# Patient Record
Sex: Male | Born: 1955 | Hispanic: Yes | Marital: Single | Smoking: Former smoker
Health system: Southern US, Community
[De-identification: ages and names within clinical notes are randomized; demographics above are authoritative.]

## PROBLEM LIST (undated history)

## (undated) DIAGNOSIS — E785 Hyperlipidemia, unspecified: Secondary | ICD-10-CM

## (undated) DIAGNOSIS — I5022 Chronic systolic (congestive) heart failure: Secondary | ICD-10-CM

## (undated) DIAGNOSIS — N186 End stage renal disease: Secondary | ICD-10-CM

## (undated) DIAGNOSIS — I739 Peripheral vascular disease, unspecified: Secondary | ICD-10-CM

## (undated) DIAGNOSIS — N400 Enlarged prostate without lower urinary tract symptoms: Secondary | ICD-10-CM

## (undated) DIAGNOSIS — E119 Type 2 diabetes mellitus without complications: Secondary | ICD-10-CM

## (undated) DIAGNOSIS — I255 Ischemic cardiomyopathy: Secondary | ICD-10-CM

## (undated) DIAGNOSIS — I639 Cerebral infarction, unspecified: Secondary | ICD-10-CM

## (undated) DIAGNOSIS — I480 Paroxysmal atrial fibrillation: Secondary | ICD-10-CM

## (undated) DIAGNOSIS — I10 Essential (primary) hypertension: Secondary | ICD-10-CM

## (undated) DIAGNOSIS — I2510 Atherosclerotic heart disease of native coronary artery without angina pectoris: Secondary | ICD-10-CM

## (undated) HISTORY — PX: CARDIAC CATHETERIZATION: SHX172

## (undated) HISTORY — PX: INSERTION OF DIALYSIS CATHETER: SHX1324

---

## 1999-05-24 ENCOUNTER — Emergency Department (HOSPITAL_COMMUNITY): Admission: EM | Admit: 1999-05-24 | Discharge: 1999-05-24 | Payer: Self-pay | Admitting: Emergency Medicine

## 2003-04-14 ENCOUNTER — Emergency Department (HOSPITAL_COMMUNITY): Admission: AD | Admit: 2003-04-14 | Discharge: 2003-04-14 | Payer: Self-pay | Admitting: *Deleted

## 2014-01-30 HISTORY — PX: CORONARY ARTERY BYPASS GRAFT: SHX141

## 2015-10-29 ENCOUNTER — Other Ambulatory Visit: Payer: Self-pay | Admitting: Vascular Surgery

## 2015-11-05 ENCOUNTER — Encounter
Admission: RE | Admit: 2015-11-05 | Discharge: 2015-11-05 | Disposition: A | Discharge status: Home / Self Care | Payer: Medicaid Other | Source: Ambulatory Visit | Attending: Vascular Surgery | Admitting: Vascular Surgery

## 2015-11-05 DIAGNOSIS — I252 Old myocardial infarction: Secondary | ICD-10-CM | POA: Insufficient documentation

## 2015-11-05 DIAGNOSIS — Z0183 Encounter for blood typing: Secondary | ICD-10-CM | POA: Insufficient documentation

## 2015-11-05 DIAGNOSIS — Z951 Presence of aortocoronary bypass graft: Secondary | ICD-10-CM | POA: Diagnosis not present

## 2015-11-05 DIAGNOSIS — Z79899 Other long term (current) drug therapy: Secondary | ICD-10-CM | POA: Insufficient documentation

## 2015-11-05 DIAGNOSIS — Z8249 Family history of ischemic heart disease and other diseases of the circulatory system: Secondary | ICD-10-CM | POA: Insufficient documentation

## 2015-11-05 DIAGNOSIS — F17200 Nicotine dependence, unspecified, uncomplicated: Secondary | ICD-10-CM | POA: Insufficient documentation

## 2015-11-05 DIAGNOSIS — N186 End stage renal disease: Secondary | ICD-10-CM | POA: Insufficient documentation

## 2015-11-05 DIAGNOSIS — Z01818 Encounter for other preprocedural examination: Secondary | ICD-10-CM | POA: Insufficient documentation

## 2015-11-05 DIAGNOSIS — Z992 Dependence on renal dialysis: Secondary | ICD-10-CM | POA: Insufficient documentation

## 2015-11-05 DIAGNOSIS — E1122 Type 2 diabetes mellitus with diabetic chronic kidney disease: Secondary | ICD-10-CM | POA: Insufficient documentation

## 2015-11-05 DIAGNOSIS — I2510 Atherosclerotic heart disease of native coronary artery without angina pectoris: Secondary | ICD-10-CM

## 2015-11-05 DIAGNOSIS — Z01812 Encounter for preprocedural laboratory examination: Secondary | ICD-10-CM | POA: Diagnosis not present

## 2015-11-05 DIAGNOSIS — I120 Hypertensive chronic kidney disease with stage 5 chronic kidney disease or end stage renal disease: Secondary | ICD-10-CM | POA: Insufficient documentation

## 2015-11-05 DIAGNOSIS — Z8673 Personal history of transient ischemic attack (TIA), and cerebral infarction without residual deficits: Secondary | ICD-10-CM | POA: Diagnosis not present

## 2015-11-05 DIAGNOSIS — Z794 Long term (current) use of insulin: Secondary | ICD-10-CM | POA: Insufficient documentation

## 2015-11-05 DIAGNOSIS — Z833 Family history of diabetes mellitus: Secondary | ICD-10-CM | POA: Insufficient documentation

## 2015-11-05 HISTORY — DX: Benign prostatic hyperplasia without lower urinary tract symptoms: N40.0

## 2015-11-05 HISTORY — DX: Atherosclerotic heart disease of native coronary artery without angina pectoris: I25.10

## 2015-11-05 HISTORY — DX: Cerebral infarction, unspecified: I63.9

## 2015-11-05 HISTORY — DX: End stage renal disease: N18.6

## 2015-11-05 HISTORY — DX: Essential (primary) hypertension: I10

## 2015-11-05 HISTORY — DX: Type 2 diabetes mellitus without complications: E11.9

## 2015-11-05 HISTORY — DX: Hyperlipidemia, unspecified: E78.5

## 2015-11-05 LAB — BASIC METABOLIC PANEL
Anion gap: 12 (ref 5–15)
BUN: 54 mg/dL — AB (ref 6–20)
CHLORIDE: 99 mmol/L — AB (ref 101–111)
CO2: 27 mmol/L (ref 22–32)
Calcium: 8.9 mg/dL (ref 8.9–10.3)
Creatinine, Ser: 5.75 mg/dL — ABNORMAL HIGH (ref 0.61–1.24)
GFR calc Af Amer: 11 mL/min — ABNORMAL LOW (ref >60)
GFR calc non Af Amer: 10 mL/min — ABNORMAL LOW (ref >60)
GLUCOSE: 122 mg/dL — AB (ref 65–99)
POTASSIUM: 4.1 mmol/L (ref 3.5–5.1)
SODIUM: 138 mmol/L (ref 135–145)

## 2015-11-05 LAB — CBC WITH DIFFERENTIAL/PLATELET
Basophils Absolute: 0 10*3/uL (ref 0–0.1)
Basophils Relative: 1 %
EOS PCT: 2 %
Eosinophils Absolute: 0.2 10*3/uL (ref 0–0.7)
HCT: 33.1 % — ABNORMAL LOW (ref 40.0–52.0)
Hemoglobin: 11.4 g/dL — ABNORMAL LOW (ref 13.0–18.0)
LYMPHS ABS: 1 10*3/uL (ref 1.0–3.6)
LYMPHS PCT: 10 %
MCH: 29.3 pg (ref 26.0–34.0)
MCHC: 34.4 g/dL (ref 32.0–36.0)
MCV: 85.1 fL (ref 80.0–100.0)
MONOS PCT: 7 %
Monocytes Absolute: 0.7 10*3/uL (ref 0.2–1.0)
Neutro Abs: 7.8 10*3/uL — ABNORMAL HIGH (ref 1.4–6.5)
Neutrophils Relative %: 80 %
PLATELETS: 299 10*3/uL (ref 150–440)
RBC: 3.88 MIL/uL — AB (ref 4.40–5.90)
RDW: 14.2 % (ref 11.5–14.5)
WBC: 9.6 10*3/uL (ref 3.8–10.6)

## 2015-11-05 LAB — TYPE AND SCREEN
ABO/RH(D): A POS
ANTIBODY SCREEN: NEGATIVE

## 2015-11-05 LAB — PROTIME-INR
INR: 1.01
Prothrombin Time: 13.3 seconds (ref 11.4–15.2)

## 2015-11-05 LAB — SURGICAL PCR SCREEN
MRSA, PCR: NEGATIVE
Staphylococcus aureus: NEGATIVE

## 2015-11-05 LAB — APTT: aPTT: 30 seconds (ref 24–36)

## 2015-11-05 MED ORDER — CHLORHEXIDINE GLUCONATE CLOTH 2 % EX PADS
6.0000 | MEDICATED_PAD | Freq: Once | CUTANEOUS | Status: DC
  Filled 2015-11-05: qty 6

## 2015-11-05 NOTE — Patient Instructions (Addendum)
  Your procedure is scheduled on: 11/15/15 Fri Report to Same Day Surgery 2nd floor medical mall To find out your arrival time please call [phone] between 1PM - 3PM on 11/14/15 Thurs  Remember: Instructions that are not followed completely may result in serious medical risk, up to and including death, or upon the discretion of your surgeon and anesthesiologist your surgery may need to be rescheduled.    _x___ 1. Do not eat food or drink liquids after midnight. No gum chewing or hard candies.     __x__ 2. No Alcohol for 24 hours before or after surgery.   __x__3. No Smoking for 24 prior to surgery.   ____  4. Bring all medications with you on the day of surgery if instructed.    __x__ 5. Notify your doctor if there is any change in your medical condition     (cold, fever, infections).     Do not wear jewelry, make-up, hairpins, clips or nail polish.  Do not wear lotions, powders, or perfumes. You may wear deodorant.  Do not shave 48 hours prior to surgery. Men may shave face and neck.  Do not bring valuables to the hospital.    [facility] is not responsible for any belongings or valuables.               Contacts, dentures or bridgework may not be worn into surgery.  Leave your suitcase in the car. After surgery it may be brought to your room.  For patients admitted to the hospital, discharge time is determined by your treatment team.   Patients discharged the day of surgery will not be allowed to drive home.    Please read over the following fact sheets that you were given:   [facility] Preparing for Surgery and or MRSA Information   _x___ Take these medicines the morning of surgery with A SIP OF WATER:    1. amLODipine (NORVASC) 2.5 MG tablet  2.carvedilol (COREG) 12.5 MG tablet  3.isosorbide dinitrate (ISORDIL) 30 MG tablet  4.lisinopril (PRINIVIL,ZESTRIL) 2.5 MG tablet  5.  6.  ____ Fleet Enema (as directed)   _x___ Use CHG Soap or sage wipes as directed on  instruction sheet   ____ Use inhalers on the day of surgery and bring to hospital day of surgery  ____ Stop metformin 2 days prior to surgery    __x__ Take 1/2 of usual insulin dose the night before surgery and none on the morning of           surgery.   __x__ Stop aspirin or coumadin, or plavix Stop Plavix 4 days before surgery  _x__ Stop Anti-inflammatories such as Advil, Aleve, Ibuprofen, Motrin, Naproxen,          Naprosyn, Goodies powders or aspirin products. Ok to take Tylenol.   ____ Stop supplements until after surgery.    ____ Bring C-Pap to the hospital.

## 2015-11-06 NOTE — Pre-Procedure Instructions (Signed)
MORE THAN 100 PAGES RECEIVED FROM [facility]. DISCHARGE SUMMARY AND CARDIAC SUMMARY [facility]ED TO [facility] AFTER CARDIAC CLEARANCE REQUESTED BY DR Randa NgoPISCITELLO

## 2015-11-14 ENCOUNTER — Telehealth: Payer: Self-pay | Admitting: Internal Medicine

## 2015-11-14 NOTE — Telephone Encounter (Signed)
Received cardiac clearance request from [facility] for 10/4 left branch/axillary graft. Pt has 9/20 new patient OV w/Dr. End. Clearance placed in MD basket.

## 2015-11-14 NOTE — Pre-Procedure Instructions (Addendum)
[facility] AT [facility] 11/11/15    11/13/15   AND 11/14/15 TO GET INFO ON CLEARANCE STATUS SPOKE WITH LAURA,DID NOT RECEIVE PREVIOUS FAX. REFAXED. SURGERY TO BE RESCHEDULED AND SHE WILL NOTIFY PATIENT

## 2015-11-20 ENCOUNTER — Encounter: Payer: Self-pay | Admitting: Internal Medicine

## 2015-11-20 ENCOUNTER — Ambulatory Visit (INDEPENDENT_AMBULATORY_CARE_PROVIDER_SITE_OTHER): Payer: Medicaid Other | Admitting: Internal Medicine

## 2015-11-20 VITALS — BP 88/50 | HR 80 | Ht 68.0 in | Wt 150.5 lb

## 2015-11-20 DIAGNOSIS — I255 Ischemic cardiomyopathy: Secondary | ICD-10-CM | POA: Diagnosis not present

## 2015-11-20 DIAGNOSIS — I159 Secondary hypertension, unspecified: Secondary | ICD-10-CM | POA: Diagnosis not present

## 2015-11-20 DIAGNOSIS — I639 Cerebral infarction, unspecified: Secondary | ICD-10-CM | POA: Diagnosis not present

## 2015-11-20 DIAGNOSIS — I2510 Atherosclerotic heart disease of native coronary artery without angina pectoris: Secondary | ICD-10-CM | POA: Diagnosis not present

## 2015-11-20 DIAGNOSIS — Z01810 Encounter for preprocedural cardiovascular examination: Secondary | ICD-10-CM

## 2015-11-20 DIAGNOSIS — I209 Angina pectoris, unspecified: Secondary | ICD-10-CM | POA: Insufficient documentation

## 2015-11-20 DIAGNOSIS — I10 Essential (primary) hypertension: Secondary | ICD-10-CM | POA: Insufficient documentation

## 2015-11-20 NOTE — Progress Notes (Signed)
New Outpatient Visit Date: 11/20/2015  Referring Provider: Levora Dredge, MD [facility]  Chief Complaint:  Chief Complaint  Patient presents with  . other    Cardiac clearance for INSERTION OF ARTERIOVENOUS (AV) GORE-TEX GRAFT ARM ( BRACH / AXILLARY GRAFT )    HPI:  [name] is a 60 y.o. 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 Gilda Crease for preoperative cardiovascular risk assessment before undergoing left upper extremity dialysis graft creation. History is obtained with the assistance of a Spanish interpreter. Today, the patient reports feeling well, though he notes considerable fatigue and dyspnea with even mild exertion. This has been present for at least 3-6 months. He denies chest pain, lightheadedness, and orthopnea. He is compliant with his medications and dialysis schedule. He last underwent HD yesterday via his right chest tunneled catheter.  The patient was hospitalized in 08/2015 at [facility] in [place], [place] with chest pain and shortness of breath. This time, he was found to have a non-ST segment elevation MI that was complicated by acute decompensated heart failure, acute stroke, and advanced renal failure. He was started on hemodialysis and did not undergo cardiac catheterization due to the aforementioned stroke. He was treated medically with improvement in his mental status is felt to be due to a combination of stroke, uremia, and heart failure. At that time, he was noted to have severely reduced LV systolic function with an EF of 30-35%. He was also very hypertensive at that time requiring multiple blood pressure agents. The patient notes that since that time, his blood pressure has  been well-controlled or even low, most often shortly after his dialysis session.  Mr. Grine was previously followed by [facility] cardiology, having last seen Dr. Erline Levine in 08/2014.  At that time, the patient was doing well. Echocardiogram following CABG revealed improved systolic function from 30-35% up to 45-50%. The patient also reports that he may need carotid artery intervention on the right at some point in the future but does not know specifics. He has been referred to [facility] for possible, though he would prefer to have this done [place]. Of note, carotid Doppler performed at [facility] in 08/2015 showed 41-59% stenosis in the proximal right internal carotid artery. There is no significant left internal carotid artery stenosis.  --------------------------------------------------------------------------------------------------  Past Medical History:  Diagnosis Date  . Anginal pain (HCC)   . Coronary artery disease   . Diabetes mellitus without complication (HCC)   . Elevated lipids   . Enlarged prostate   . ESRD ([place] stage renal disease) (HCC)    dialysis M-W-F  . Hypertension   . Myocardial infarction (HCC)   . Stroke [facility])    Lt side this year July    Past Surgical History:  Procedure Laterality Date  . CORONARY ARTERY BYPASS GRAFT  01/2014   [facility] (LIMA -> LAD and SVG -> OM)    Outpatient Encounter Prescriptions as of 11/20/2015  Medication Sig  . amLODipine (NORVASC) 2.5 MG tablet Take 2.5 mg by mouth daily.  [name] aspirin EC 81 MG tablet Take 81 mg by mouth daily.  [name] atorvastatin (LIPITOR) 40 MG tablet Take 80 mg by mouth daily at 6 PM.  . carvedilol (COREG) 12.5 MG tablet Take 12.5 mg by  mouth 2 (two) times daily with a meal.  . clopidogrel (PLAVIX) 75 MG tablet Take 75 mg by mouth daily.  [name]. glipiZIDE (GLUCOTROL) 5 MG tablet Take 10 mg by mouth 2 (two) times daily before a meal.  . insulin glargine (LANTUS) 100 UNIT/ML injection Inject 19 Units into the skin at bedtime.  .  insulin regular (NOVOLIN R,HUMULIN R) 100 units/mL injection Inject 10 Units into the skin 3 (three) times daily before meals. 0-10 units  . isosorbide dinitrate (ISORDIL) 30 MG tablet Take 30 mg by mouth daily.  [name]. lisinopril (PRINIVIL,ZESTRIL) 2.5 MG tablet Take 2.5 mg by mouth daily.  . megestrol (MEGACE) 20 MG tablet Take 20 mg by mouth 2 (two) times daily.  . Melatonin 3 MG TABS Take 1 tablet by mouth at bedtime as needed.  . nitroGLYCERIN (NITROSTAT) 0.4 MG SL tablet Place 0.4 mg under the tongue every 5 (five) minutes as needed for chest pain.  . tamsulosin (FLOMAX) 0.4 MG CAPS capsule Take 0.4 mg by mouth daily after breakfast.   No facility-administered encounter medications on file as of 11/20/2015.     Allergies: Review of patient's allergies indicates no known allergies.  Social History   Social History  . Marital status: Single    Spouse name: N/A  . Number of children: N/A  . Years of education: N/A   Occupational History  . Not on file.   Social History Main Topics  . Smoking status: Former Smoker    Packs/day: 0.50    Years: 45.00    Quit date: 01/05/2015  . Smokeless tobacco: Never Used  . Alcohol use No  . Drug use: No  . Sexual activity: Not on file   Other Topics Concern  . Not on file   Social History Narrative  . No narrative on file    Family History  Problem Relation Age of Onset  . Cancer Mother   . Diabetes Mother   . Diabetes Father     Review of Systems: A 12-system review of systems was performed and was negative except as noted in the HPI.  --------------------------------------------------------------------------------------------------  Physical Exam: BP (!) 88/50 (BP Location: Left Arm, Patient Position: Sitting)   Pulse 80   Ht 5\' 8"  (1.727 m)   Wt 150 lb 8 oz (68.3 kg)   BMI 22.88 kg/m   General:  Thin, chronically ill-appearing man, seated comfortably in the exam room. He is accompanied by his daughter. HEENT: Mild  conjunctival pallor is noted. There is no scleral icterus..  Moist mucous membranes.  OP clear. Neck: Supple without lymphadenopathy, thyromegaly, JVD, or HJR.  No carotid bruit. Lungs: Normal work of breathing.  Clear to auscultation bilaterally without wheezes or crackles. CV: Regular rate and rhythm without murmurs, rubs, or gallops.  Non-displaced PMI. Abd: Bowel sounds present.  Soft, NT/ND without hepatosplenomegaly Ext: No lower extremity edema.  Radial, PT, and DP pulses are 2+ bilaterally Skin: warm and dry without rash Neuro: Patient ambulates with a cane. He has 4/5 strength in the left lower extremity. Otherwise, he has 4+/5 strength in the upper and right lower extremities. Psych: Normal mood and affect.  EKG:  Normal sinus rhythm with borderline QT prolongation. Otherwise no significant abnormalities.  Lab Results  Component Value Date   WBC 9.6 11/05/2015   HGB 11.4 (L) 11/05/2015   HCT 33.1 (L) 11/05/2015   MCV 85.1 11/05/2015   PLT 299 11/05/2015    Lab Results  Component Value Date   NA  138 11/05/2015   K 4.1 11/05/2015   CL 99 (L) 11/05/2015   CO2 27 11/05/2015   BUN 54 (H) 11/05/2015   CREATININE 5.75 (H) 11/05/2015   GLUCOSE 122 (H) 11/05/2015    --------------------------------------------------------------------------------------------------  ASSESSMENT AND PLAN: 1. Atherosclerosis of native coronary artery of native heart without angina pectoris The patient has a history of significant coronary artery disease including 90% LMCA stenosis leading to 2 vessel CABG at [facility] in 01/2016. Though he does not express chest pain today, his considerable fatigue and exertional dyspnea may reflect anginal equivalents. This is further confounded by the patient's severe LV dysfunction noted by echocardiogram at [facility] in July. At this time, we have agreed to continue dual antiplatelet therapy for medical management of recent NSTEMI as well as aggressive secondary  prevention with atorvastatin and carvedilol. We will obtain a transthoracic echocardiogram to reevaluate his systolic function. If he continues to have severe systolic dysfunction, we will proceed with cardiac catheterization. If his LVEF has improved, we will opt for a myocardial perfusion stress test instead.  2. Hypertension Patient is hypotensive today, though he is asymptomatic. In the setting of his severe LV dysfunction noted in July, the patient likely has soft blood pressures at baseline (as noted at preadmission testing on 11/05/15). We have agreed to discontinue amlodipine. In an effort to maintain his evidence-based heart failure therapy, we will continue with his current doses of carvedilol and lisinopril. The patient was advised to seek immediate medical attention should he develop lightheadedness.  3. Cardiomyopathy, ischemic The patient appears euvolemic to dry on exam today. He is quite diligent about restricting his fluid intake. He notes considerable fatigue and exertional dyspnea with even mild exertion compatible with NYHA class III heart failure. We will continue him on his current doses of carvedilol and lisinopril. If his blood pressure remains soft, however, we may need to consider de-escalation of carvedilol and liberalization of fluid intake. We will obtain a transthoracic echocardiogram to reassess his LV function as he has symptomatically improved since his hospitalization at [facility] in July. He will also need further ischemia evaluation, as detailed above.  4. Cerebrovascular accident (CVA), unspecified mechanism (HCC) The patient suffered acute bilateral strokes during his hospitalization at [facility] in July. It is unclear what the underlying etiology was, given that he has moderate ICA plaque on the right and no significant stenosis on the left. It should be noted that he had an isolated episode of atrial fibrillation in the postoperative period following CABG in  2015. It is conceivable that he has paroxysmal atrial fibrillation, which led to his recent stroke. His CHADSVASC score of 5 suggests that he would benefit from systemic anticoagulation if he indeed does have atrial fibrillation. We will readdress this when he returns for follow-up. In the meantime, we have agreed to continue with dual antiplatelet therapy with aspirin and clopidogrel. Given his [place]-stage renal disease, he would likely need warfarin for long-term anticoagulation if we choose to go down this route. Placement of an event monitor may also be helpful to evaluate for silent atrial fibrillation.  The patient also wishes to speak with vascular surgery in [place] rather than [facility] regarding possible intervention for his carotid artery disease. I have encouraged him to speak with Dr. Gilda Crease, with whom the patient is scheduled to undergo dialysis access placement.  5. Preop cardiovascular exam At this time, the patient is very high risk for any elective surgery. I would advocate for completing the aforementioned  cardiac workup, including echocardiogram and ischemia evaluation, prior to proceeding with nonemergent surgery. Further recommendations will be provided the following cardiovascular workup.  Follow-up: Return to clinic in 6 weeks.  Greater than 60 minutes was spent interviewing and examining the patient as well as reviewing records for his complex cardiac history from [facility]. More than 50% of the time was spent face-to-face with the patient.  Yvonne Kendall, MD 11/20/2015 1:49 PM

## 2015-11-20 NOTE — Patient Instructions (Addendum)
Medication Instructions:  Your physician has recommended you make the following change in your medication:  1. STOP taking Amlodipine    Testing/Procedures: Your physician has requested that you have an echocardiogram. Echocardiography is a painless test that uses sound waves to create images of your heart. It provides your doctor with information about the size and shape of your heart and how well your heart's chambers and valves are working. This procedure takes approximately one hour. There are no restrictions for this procedure.   Follow-Up: Your physician recommends that you schedule a follow-up appointment in: 6 weeks with Dr. Okey DupreEnd.   It was a pleasure seeing you today here in the office. Please do not hesitate to give us a call back if you have any further questions. [phone]  Hebron CellarPamela A. RN, BSN        Ecocardiograma (Echocardiogram) El ecocardiograma utiliza ondas sonoras (ultrasonido) para obtener una imagen del corazn. El ecocardiograma es simple e indoloro, se obtiene en un perodo de tiempo breve y proporciona informacin valiosa a su mdico. Las imgenes de un ecocardiograma pueden proporcionar el siguiente tipo de informacin:  Evidencia de enfermedad arterial coronaria (EAC).  Tamao del corazn.  Funcin del msculo cardaco.  Funcin de la vlvula cardaca.  Deteccin de aneurisma.  Evidencia de un infarto de miocardio pasado.  Acumulacin de lquido alrededor del corazn.  Engrosamiento del msculo cardaco.  Evaluacin de la funcin de la vlvula cardaca. INFORME A SU MDICO:  Cualquier alergia que tenga.  Todos los Walt Disneymedicamentos que utiliza, incluidos vitaminas, hierbas, gotas oftlmicas, cremas y [address]amentos de [address] lib[address].  Problemas previos que usted o los Graybar Electricmiembros de su familia hayan tenido con el uso de anestsicos.  Enfermedades de la sangre que tenga.  Cirugas previas.  Enfermedades que tenga.  Posibilidad de embarazo, si  corresponde. ANTES DEL PROCEDIMIENTO  No se requiere una preparacin especial. Coma y beba con normalidad.  PROCEDIMIENTO   Para lograr una imagen del corazn, se aplicar un gel en el pecho y luego se pasar un instrumento similar a una vara (transductor) sobre este. El gel ayudar a transmitir las Corning Incorporatedondas sonoras del transductor. Las ondas sonoras rebotarn inofensivamente en el corazn para permitir capturar las imgenes del corazn en movimiento, en tiempo real. Luego, las imgenes se Agricultural engineerregistrarn.  Quiz necesite una va IV para recibir un medicamento que mejore la calidad de las imgenes. DESPUS DEL PROCEDIMIENTO Puede retomar su rutina normal, incluidos la dieta, las actividades y Pulte Homeslos medicamentos, a menos que su mdico le indique lo contrario.   Esta informacin no tiene Theme park managercomo fin reemplazar el consejo del mdico. Asegrese de hacerle al mdico cualquier pregunta que tenga.   Document Released: 02/16/2005 Document Revised: 03/09/2014 Elsevier Interactive Patient Education [employer]2016 Elsevier Inc.

## 2015-11-21 ENCOUNTER — Ambulatory Visit (INDEPENDENT_AMBULATORY_CARE_PROVIDER_SITE_OTHER): Payer: Medicaid Other

## 2015-11-21 ENCOUNTER — Other Ambulatory Visit: Payer: Self-pay

## 2015-11-21 DIAGNOSIS — I2510 Atherosclerotic heart disease of native coronary artery without angina pectoris: Secondary | ICD-10-CM

## 2015-11-21 DIAGNOSIS — I159 Secondary hypertension, unspecified: Secondary | ICD-10-CM

## 2015-11-22 ENCOUNTER — Telehealth: Payer: Self-pay | Admitting: *Deleted

## 2015-11-22 DIAGNOSIS — R0602 Shortness of breath: Secondary | ICD-10-CM

## 2015-11-22 DIAGNOSIS — Z01810 Encounter for preprocedural cardiovascular examination: Secondary | ICD-10-CM

## 2015-11-22 NOTE — Telephone Encounter (Signed)
-----   Message from Yvonne Kendallhristopher End, MD sent at 11/22/2015  8:04 AM EDT ----- Hello, Mr. Odette HornsGarcia's echo shows normalization of LVEF.  [facility] for preoperative evaluation before undergoing dialysis graft placement.  I would greatly appreciate it, if you could let Mr. Felipa FurnaceGarcia know of the echo results and schedule him for the Myoview at his earliest convenience.  Thanks.

## 2015-11-22 NOTE — Telephone Encounter (Addendum)
Interpreter line used for this call. Interpreter Charlie ID # [id number]. Reviewed echocardiogram results with patients daughter and scheduled lexiscan as ordered by Dr. Okey DupreEnd. Reviewed all instructions for stress test and instructed her to have him hold his carvedilol and isosorbide dinitrate the night before and the morning of his procedure. Also to hold glipizide and decrease lantus to 12 units the night before. Reviewed with her all instructions for test, time, and location. She verbalized understanding of all instructions and had no further questions at this time. Instructed her to call back if any further questions and that we will arrange for patient to have an interpreter at this test.

## 2015-12-04 ENCOUNTER — Encounter
Admission: RE | Admit: 2015-12-04 | Discharge: 2015-12-04 | Disposition: A | Discharge status: Home / Self Care | Payer: Medicare Other | Source: Ambulatory Visit | Attending: Internal Medicine | Admitting: Internal Medicine

## 2015-12-04 DIAGNOSIS — R0602 Shortness of breath: Secondary | ICD-10-CM | POA: Diagnosis not present

## 2015-12-04 DIAGNOSIS — Z01810 Encounter for preprocedural cardiovascular examination: Secondary | ICD-10-CM | POA: Insufficient documentation

## 2015-12-04 MED ORDER — TECHNETIUM TC 99M TETROFOSMIN IV KIT
32.2240 | PACK | Freq: Once | INTRAVENOUS | Status: AC | PRN
  Administered 2015-12-04: 32.224 via INTRAVENOUS

## 2015-12-04 MED ORDER — TECHNETIUM TC 99M TETROFOSMIN IV KIT
12.9900 | PACK | Freq: Once | INTRAVENOUS | Status: AC | PRN
  Administered 2015-12-04: 12.99 via INTRAVENOUS

## 2015-12-04 MED ORDER — REGADENOSON 0.4 MG/5ML IV SOLN
0.4000 mg | Freq: Once | INTRAVENOUS | Status: AC
  Administered 2015-12-04: 0.4 mg via INTRAVENOUS

## 2015-12-05 ENCOUNTER — Telehealth: Payer: Self-pay | Admitting: *Deleted

## 2015-12-05 NOTE — Telephone Encounter (Signed)
Using Language Line interpreter Sheryle HailDiamond # [phone] Left voicemail message for patient to call back for results.

## 2015-12-05 NOTE — Telephone Encounter (Signed)
-----   Message from Yvonne Kendallhristopher End, MD sent at 12/05/2015  2:00 PM EDT ----- Regarding: Stress test and follow-up Hello,  Please let [name] know that his stress test is reassuring without evidence of ischemia.  He should follow-up with me at his earliest convenience (currently scheduled for 11/1, but can be moved up if possible) so that we can make sure that he is tolerating the medication changes that were made at his last visit.  If so, then he will be able to proceed with his vascular surgery.  Please try to schedule 30 minutes for his visit, since he will require a Spanish interpreter.  Thanks.  Thayer Ohmhris

## 2015-12-06 ENCOUNTER — Telehealth: Payer: Self-pay | Admitting: Internal Medicine

## 2015-12-06 ENCOUNTER — Ambulatory Visit: Admission: RE | Admit: 2015-12-06 | Payer: Self-pay | Source: Ambulatory Visit | Admitting: Vascular Surgery

## 2015-12-06 ENCOUNTER — Encounter: Admission: RE | Payer: Self-pay | Source: Ambulatory Visit

## 2015-12-06 SURGERY — INSERTION OF ARTERIOVENOUS (AV) GORE-TEX GRAFT ARM
Anesthesia: General | Laterality: Left

## 2015-12-06 NOTE — Telephone Encounter (Signed)
S[facility]w [facility] at [facility]. Pt will need f/u OV w/Dr. End before cardiac clearance given. Awaiting pt call back to set this up. Becca appreciative of the call.

## 2015-12-06 NOTE — Telephone Encounter (Signed)
Becca calling from [facility] asking if we can please send the cardiac clearance to  They are going to reschedule the test to another day, due to pt having to do cardiac testing with us.   Request for surgical clearance:  1. What type of surgery is being performed? Av Fistula placed for hemodialysis  2. When is this surgery scheduled? They are going to be rescheduling it will have a date once we clear the patient.    3. Are there any medications that need to be held prior to surgery and how long? Not sure yet.  4. Name of physician performing surgery?  [facility]   5. What is your office phone and fax number?   Fax: AttnVernona Rieger: Laura at [phone]  and a copy to Attn:  Becca at [facility] [phone]

## 2015-12-09 NOTE — Telephone Encounter (Signed)
That sounds great. Thanks.

## 2015-12-09 NOTE — Telephone Encounter (Signed)
Using YUM! BrandsLanguage Line Interpreter Roxana Hiresapiana ID # [phone] Spoke with patients daughter and reviewed her fathers results per release form. Also rescheduled his follow up appointment for 12/18/15 at 3:00PM with Dr. Okey DupreEnd to see how he is tolerating his medication changes and also to get cardiac clearance for his vascular surgery. She verbalized understanding of our conversation, agreement with plan of care, and had no further questions at this time. Let her know that we would request an interpreter for the upcoming appointment. She was thankful and had no further questions. Entered interpreter request in service hub for this appointment and extended appointment scheduled.

## 2015-12-09 NOTE — Telephone Encounter (Signed)
Spoke with Delorise [facility]Grace at [facility] and made her aware that I was able to reach the patients daughter and rescheduled his appointment 12/18/15 and that once we get clearance that we will forward to them. She verbalized understanding and was thankful for us calling with update.

## 2015-12-09 NOTE — Telephone Encounter (Signed)
Using Language Line interpreter Roxana Hiresapiana ID # [phone] [id number] Left voicemail message for patient to call back for results and appointment.

## 2015-12-18 ENCOUNTER — Emergency Department
Admission: EM | Admit: 2015-12-18 | Discharge: 2015-12-18 | Disposition: A | Discharge status: Home / Self Care | Payer: Medicare Other | Attending: Emergency Medicine | Admitting: Emergency Medicine

## 2015-12-18 ENCOUNTER — Telehealth: Payer: Self-pay | Admitting: Internal Medicine

## 2015-12-18 ENCOUNTER — Encounter: Payer: Self-pay | Admitting: Internal Medicine

## 2015-12-18 ENCOUNTER — Encounter: Payer: Self-pay | Admitting: Emergency Medicine

## 2015-12-18 ENCOUNTER — Ambulatory Visit: Payer: Medicaid Other | Admitting: Internal Medicine

## 2015-12-18 DIAGNOSIS — R531 Weakness: Secondary | ICD-10-CM | POA: Diagnosis present

## 2015-12-18 DIAGNOSIS — Z951 Presence of aortocoronary bypass graft: Secondary | ICD-10-CM | POA: Insufficient documentation

## 2015-12-18 DIAGNOSIS — Z794 Long term (current) use of insulin: Secondary | ICD-10-CM | POA: Insufficient documentation

## 2015-12-18 DIAGNOSIS — I120 Hypertensive chronic kidney disease with stage 5 chronic kidney disease or end stage renal disease: Secondary | ICD-10-CM | POA: Insufficient documentation

## 2015-12-18 DIAGNOSIS — Z87891 Personal history of nicotine dependence: Secondary | ICD-10-CM | POA: Insufficient documentation

## 2015-12-18 DIAGNOSIS — I2510 Atherosclerotic heart disease of native coronary artery without angina pectoris: Secondary | ICD-10-CM | POA: Diagnosis not present

## 2015-12-18 DIAGNOSIS — E1122 Type 2 diabetes mellitus with diabetic chronic kidney disease: Secondary | ICD-10-CM | POA: Diagnosis not present

## 2015-12-18 DIAGNOSIS — Z992 Dependence on renal dialysis: Secondary | ICD-10-CM | POA: Insufficient documentation

## 2015-12-18 DIAGNOSIS — E11649 Type 2 diabetes mellitus with hypoglycemia without coma: Secondary | ICD-10-CM | POA: Diagnosis not present

## 2015-12-18 DIAGNOSIS — Z7982 Long term (current) use of aspirin: Secondary | ICD-10-CM | POA: Diagnosis not present

## 2015-12-18 DIAGNOSIS — Z79899 Other long term (current) drug therapy: Secondary | ICD-10-CM | POA: Diagnosis not present

## 2015-12-18 DIAGNOSIS — N186 End stage renal disease: Secondary | ICD-10-CM | POA: Diagnosis not present

## 2015-12-18 LAB — CBC WITH DIFFERENTIAL/PLATELET
Basophils Absolute: 0.1 10*3/uL (ref 0–0.1)
Basophils Relative: 1 %
EOS ABS: 0.2 10*3/uL (ref 0–0.7)
EOS PCT: 4 %
HCT: 33.7 % — ABNORMAL LOW (ref 40.0–52.0)
Hemoglobin: 11.1 g/dL — ABNORMAL LOW (ref 13.0–18.0)
LYMPHS ABS: 0.9 10*3/uL — AB (ref 1.0–3.6)
LYMPHS PCT: 15 %
MCH: 29 pg (ref 26.0–34.0)
MCHC: 33 g/dL (ref 32.0–36.0)
MCV: 87.8 fL (ref 80.0–100.0)
MONO ABS: 0.6 10*3/uL (ref 0.2–1.0)
Monocytes Relative: 11 %
Neutro Abs: 4 10*3/uL (ref 1.4–6.5)
Neutrophils Relative %: 69 %
PLATELETS: 233 10*3/uL (ref 150–440)
RBC: 3.84 MIL/uL — ABNORMAL LOW (ref 4.40–5.90)
RDW: 14.9 % — AB (ref 11.5–14.5)
WBC: 5.8 10*3/uL (ref 3.8–10.6)

## 2015-12-18 LAB — BASIC METABOLIC PANEL
Anion gap: 12 (ref 5–15)
BUN: 72 mg/dL — AB (ref 6–20)
CALCIUM: 9 mg/dL (ref 8.9–10.3)
CO2: 27 mmol/L (ref 22–32)
CREATININE: 7.78 mg/dL — AB (ref 0.61–1.24)
Chloride: 101 mmol/L (ref 101–111)
GFR calc Af Amer: 8 mL/min — ABNORMAL LOW (ref >60)
GFR, EST NON AFRICAN AMERICAN: 7 mL/min — AB (ref >60)
GLUCOSE: 151 mg/dL — AB (ref 65–99)
Potassium: 4.6 mmol/L (ref 3.5–5.1)
SODIUM: 140 mmol/L (ref 135–145)

## 2015-12-18 LAB — GLUCOSE, CAPILLARY
GLUCOSE-CAPILLARY: 150 mg/dL — AB (ref 65–99)
GLUCOSE-CAPILLARY: 199 mg/dL — AB (ref 65–99)
Glucose-Capillary: 46 mg/dL — ABNORMAL LOW (ref 65–99)
Glucose-Capillary: 52 mg/dL — ABNORMAL LOW (ref 65–99)
Glucose-Capillary: 131 mg/dL — ABNORMAL HIGH (ref 65–99)

## 2015-12-18 LAB — TROPONIN I
TROPONIN I: 0.06 ng/mL — AB (ref <0.03)
Troponin I: 0.06 ng/mL (ref <0.03)

## 2015-12-18 MED ORDER — OCTREOTIDE ACETATE 100 MCG/ML IJ SOLN
75.0000 ug | Freq: Once | INTRAMUSCULAR | Status: AC
  Administered 2015-12-18: 75 ug via SUBCUTANEOUS
  Filled 2015-12-18: qty 0.8
  Filled 2015-12-18: qty 1.5

## 2015-12-18 NOTE — ED Provider Notes (Signed)
[facility] Emergency Department Provider Note  ____________________________________________  Time seen: Approximately 3:23 PM  I have reviewed the triage vital signs and the nursing notes.   HISTORY  Chief Complaint Weakness   HPI Eddie Myers is a 60 y.o. 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 Patient was in the waiting room when he became diaphoretic and unresponsive. Code blue was called. Stat glucose 36, patient given D50 bolus with improvement of mental status. Patient arrives to the ED stable with no complaints. Patient is on Lantus at nighttime and took his dose yesterday evening. He is also on a sliding scale and took 10 units of insulin at 8 AM. Patient reports that he had lunch at noon but does not remember what he ate. He denies having episodes of hypoglycemia in the past. Patient denies headache, change in vision, chest pain, unilateral weakness or numbness, facial droop, dysphagia, dysarthria, difficulty finding words, abdominal pain, nausea, vomiting. Patient had NM stress test on 12/04/15 showing :   small defect of mild severity present in the mid anterior and apical anterior location. This likely represents scar versus artifact. No significant ischemia is identified.  Left ventricular systolic function is >65%.  This is a low risk study.  Past Medical History:  Diagnosis Date  . Anginal pain (HCC)   . Coronary artery disease   . Diabetes mellitus without complication (HCC)   . Elevated lipids   . Enlarged prostate   . ESRD (end stage renal disease) (HCC)    dialysis M-W-F  . Hypertension   . Myocardial infarction   . Stroke [facility])    Lt side this year July    Patient Active Problem List   Diagnosis Date Noted  . Anginal pain (HCC) 11/20/2015  . Secondary hypertension, unspecified 11/20/2015  . Coronary atherosclerosis of native coronary artery  11/20/2015  . Cardiomyopathy, ischemic 11/20/2015  . Stroke [facility]) 11/20/2015    Past Surgical History:  Procedure Laterality Date  . CORONARY ARTERY BYPASS GRAFT  01/2014   [facility] (LIMA -> LAD and SVG -> OM)    Prior to Admission medications   Medication Sig Start Date End Date Taking? Authorizing Provider  aspirin EC 81 MG tablet Take 81 mg by mouth daily.   Yes Historical Provider, MD  atorvastatin (LIPITOR) 40 MG tablet Take 80 mg by mouth daily at 6 PM.   Yes Historical Provider, MD  carvedilol (COREG) 12.5 MG tablet Take 12.5 mg by mouth 2 (two) times daily with a meal.   Yes Historical Provider, MD  clopidogrel (PLAVIX) 75 MG tablet Take 75 mg by mouth daily.   Yes Historical Provider, MD  glipiZIDE (GLUCOTROL) 5 MG tablet Take 10 mg by mouth 2 (two) times daily before a meal.   Yes Historical Provider, MD  insulin glargine (LANTUS) 100 UNIT/ML injection Inject 19 Units into the skin at bedtime.   Yes Historical Provider, MD  insulin regular (NOVOLIN R,HUMULIN R) 100 units/mL injection Inject 10 Units into the skin 3 (three) times daily before meals. 0-10 units   Yes Historical Provider, MD  isosorbide dinitrate (ISORDIL) 30 MG tablet Take 30 mg by mouth daily.   Yes Historical Provider, MD  lisinopril (PRINIVIL,ZESTRIL) 2.5 MG tablet Take 2.5 mg by mouth daily.   Yes Historical Provider, MD  megestrol (MEGACE) 20 MG tablet Take 20 mg by mouth 2 (two) times daily.  Yes Historical Provider, MD  Melatonin 3 MG TABS Take 1 tablet by mouth at bedtime as needed.   Yes Historical Provider, MD  nitroGLYCERIN (NITROSTAT) 0.4 MG SL tablet Place 0.4 mg under the tongue every 5 (five) minutes as needed for chest pain.   Yes Historical Provider, MD  tamsulosin (FLOMAX) 0.4 MG CAPS capsule Take 0.4 mg by mouth daily after breakfast.   Yes Historical Provider, MD    Allergies Review of patient's allergies indicates no known allergies.  Family History  Problem Relation Age of Onset  .  Cancer Mother   . Diabetes Mother   . Diabetes Father     Social History Social History  Substance Use Topics  . Smoking status: Former Smoker    Packs/day: 0.50    Years: 45.00    Quit date: 01/05/2015  . Smokeless tobacco: Never Used  . Alcohol use No    Review of Systems  Constitutional: Negative for fever. + syncope Eyes: Negative for visual changes. ENT: Negative for sore throat. Cardiovascular: Negative for chest pain. + diaphoresis Respiratory: Negative for shortness of breath. Gastrointestinal: Negative for abdominal pain, vomiting or diarrhea. Genitourinary: Negative for dysuria. Musculoskeletal: Negative for back pain. Skin: Negative for rash. Neurological: Negative for headaches, weakness or numbness.  ____________________________________________   PHYSICAL EXAM:  VITAL SIGNS: ED Triage Vitals  Enc Vitals Group     BP 12/18/15 1514 (!) 161/92     Pulse Rate 12/18/15 1514 77     Resp 12/18/15 1514 18     Temp 12/18/15 1514 97.7 F (36.5 C)     Temp Source 12/18/15 1514 Oral     SpO2 12/18/15 1514 99 %     Weight 12/18/15 1517 170 lb (77.1 kg)     Height 12/18/15 1517 5\' 8"  (1.727 m)     Head Circumference --      Peak Flow --      Pain Score 12/18/15 1518 0     Pain Loc --      Pain Edu? --      Excl. in GC? --     Constitutional: Alert and oriented. Well appearing and in no apparent distress. HEENT:      Head: Normocephalic and atraumatic.         Eyes: Conjunctivae are normal. Sclera is non-icteric. EOMI. PERRL      Mouth/Throat: Mucous membranes are moist.       Neck: Supple with no signs of meningismus. Cardiovascular: Regular rate and rhythm. No murmurs, gallops, or rubs. 2+ symmetrical distal pulses are present in all extremities. No JVD. Respiratory: Normal respiratory effort. Lungs are clear to auscultation bilaterally. No wheezes, crackles, or rhonchi.  Gastrointestinal: Soft, non tender, and non distended with positive bowel sounds. No  rebound or guarding. Musculoskeletal: Nontender with normal range of motion in all extremities. No edema, cyanosis, or erythema of extremities. Neurologic: Normal speech and language. A & O x3, PERRL, no nystagmus, CN II-XII intact, motor testing reveals good tone and bulk throughout. There is no evidence of pronator drift or dysmetria. Muscle strength is 5/5 throughout. Deep tendon reflexes are 2+ throughout with downgoing toes. Sensory examination is intact. Gait deferred Skin: Skin is warm, dry and intact. No rash noted. Psychiatric: Mood and affect are normal. Speech and behavior are normal.  ____________________________________________   LABS (all labs ordered are listed, but only abnormal results are displayed)  Labs Reviewed  GLUCOSE, CAPILLARY - Abnormal; Notable for the following:  Result Value   Glucose-Capillary 150 (*)    All other components within normal limits  CBC WITH DIFFERENTIAL/PLATELET - Abnormal; Notable for the following:    RBC 3.84 (*)    Hemoglobin 11.1 (*)    HCT 33.7 (*)    RDW 14.9 (*)    Lymphs Abs 0.9 (*)    All other components within normal limits  BASIC METABOLIC PANEL - Abnormal; Notable for the following:    Glucose, Bld 151 (*)    BUN 72 (*)    Creatinine, Ser 7.78 (*)    GFR calc non Af Amer 7 (*)    GFR calc Af Amer 8 (*)    All other components within normal limits  TROPONIN I - Abnormal; Notable for the following:    Troponin I 0.06 (*)    All other components within normal limits  GLUCOSE, CAPILLARY - Abnormal; Notable for the following:    Glucose-Capillary 46 (*)    All other components within normal limits  GLUCOSE, CAPILLARY - Abnormal; Notable for the following:    Glucose-Capillary 52 (*)    All other components within normal limits  TROPONIN I - Abnormal; Notable for the following:    Troponin I 0.06 (*)    All other components within normal limits  GLUCOSE, CAPILLARY - Abnormal; Notable for the following:     Glucose-Capillary 131 (*)    All other components within normal limits  GLUCOSE, CAPILLARY - Abnormal; Notable for the following:    Glucose-Capillary 199 (*)    All other components within normal limits  CBG MONITORING, ED  CBG MONITORING, ED   ____________________________________________  EKG  ED ECG REPORT I, Nita Sickle, the attending physician, personally viewed and interpreted this ECG.  Normal sinus rhythm, rate of 77, normal intervals, normal axis, no ST elevations or depressions, T-wave inversions in 1 and aVL. Unchanged from prior ____________________________________________  RADIOLOGY  none  ____________________________________________   PROCEDURES  Procedure(s) performed: None Procedures Critical Care performed:  None ____________________________________________   INITIAL IMPRESSION / ASSESSMENT AND PLAN / ED COURSE  60 y.o. 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 Patient was given D50 and is now back to his baseline. Patient has no complaints. Had a stress test done 2 weeks ago which was negative. We'll check basic labs watch patient for a couple of hours and make sure his sugar doesn't drop again and discharged home as patient is on glipizide on top of Lantus and ISS.  Clinical Course  Comment By Time  Patient repeat BG 46. He does take glipizide so will try octreotide in case this is a sulfonylurea induced hypoglycemia. Patient is drinking orange juice and having peanut butter Nita Sickle, MD 10/18 1655  Patient's BG has remained stable. Patient remains well and at baseline. Recommended patient holding lantus tonight and restarting normal insulin regimen in the am tomorrow. Will dc home at this time. Nita Sickle, MD 10/18 2030    Pertinent labs & imaging results that were available during my care of  the patient were reviewed by me and considered in my medical decision making (see chart for details).    ____________________________________________   FINAL CLINICAL IMPRESSION(S) / ED DIAGNOSES  Final diagnoses:  Hypoglycemia due to type 2 diabetes mellitus (HCC)      NEW MEDICATIONS STARTED DURING THIS VISIT:  New Prescriptions  No medications on file     Note:  This document was prepared using Dragon voice recognition software and may include unintentional dictation errors.    Nita Sicklearolina Sharren Schnurr, MD 12/18/15 2033

## 2015-12-18 NOTE — Discharge Instructions (Signed)
Haga un seguimiento con su mdico en 2 das. No tome su insulina o glipizida esta noche. Puede reanudar que somos un rgimen de insulina normal [place].   Follow-up with your doctor in 2 days. Do not take your insulin or glipizide this evening. You can resume we are normal insulin regimen tomorrow.

## 2015-12-18 NOTE — ED Notes (Signed)

## 2015-12-18 NOTE — ED Provider Notes (Signed)
[facility] Department of Emergency Medicine   Code Blue CONSULT NOTE  Chief Complaint: Cardiac arrest/unresponsive   Level V Caveat: Unresponsive  History of present illness: I was contacted by the hospital for a CODE BLUE unresponsive at outpatient cardiac mall and presented to the patient's bedside. Patient found unresponsive with diaphoretic.  Stat glucose reveal sugar of 36.  Patient given D50 with improvement in mentation.  Patient amnestic to event.       ROS: Unable to obtain, Level V caveat  Scheduled Meds: Continuous Infusions: PRN Meds:. Past Medical History:  Diagnosis Date  . Anginal pain (HCC)   . Coronary artery disease   . Diabetes mellitus without complication (HCC)   . Elevated lipids   . Enlarged prostate   . ESRD (end stage renal disease) (HCC)    dialysis M-W-F  . Hypertension   . Myocardial infarction   . Stroke [facility](HCC)    Lt side this year July   Past Surgical History:  Procedure Laterality Date  . CORONARY ARTERY BYPASS GRAFT  01/2014   [facility] (LIMA -> LAD and SVG -> OM)   Social History   Social History  . Marital status: Single    Spouse name: N/A  . Number of children: N/A  . Years of education: N/A   Occupational History  . Not on file.   Social History Main Topics  . Smoking status: Former Smoker    Packs/day: 0.50    Years: 45.00    Quit date: 01/05/2015  . Smokeless tobacco: Never Used  . Alcohol use No  . Drug use: No  . Sexual activity: Not on file   Other Topics Concern  . Not on file   Social History Narrative  . No narrative on file   No Known Allergies  Last set of Vital Signs (not current) Vitals:   12/18/15 1514  BP: (!) 161/92  Pulse: 77  Resp: 18  Temp: 97.7 F (36.5 C)      Physical Exam  Gen: unresponsive, diaphoretic, acutely ill Cardiovascular: strong carotid pulses Resp:spontaneous respirations Abd: nondistended  Neuro: GCS 6 HEENT: Pupils 3mm  Neck: No crepitus   Musculoskeletal: No deformity  Skin: warm Cardiopulmonary Resuscitation (CPR) Procedure Note  Directed/Performed by: Willy EddyPatrick Modean Mccullum I personally directed ancillary staff and/or performed CPR in an effort to regain return of spontaneous circulation and to maintain cardiac, neuro and systemic perfusion.    Medical Decision making  Patient with profound symptomatically hypoglycemic episode with acute encephalopathy. Symptoms improved after administration of D50. Patient brought to the ER for further evaluation.  Have discussed with the patient and available family all diagnostics and treatments performed thus far and all questions were answered to the best of my ability. The patient demonstrates understanding and agreement with plan.    Assessment and Plan  Transfer to the ER for further evaluation.    Willy EddyPatrick Cathyrn Deas, MD 12/18/15 [phone]

## 2015-12-18 NOTE — ED Triage Notes (Signed)
Pt was having a stress test done in the facility today when pt passed out prior to procedure. Hospital personnel called a code, found his CBG at 30 and gave him 1 amp of Dextrose. CGB now 150. A&O x4. Pt is a dialysis pt.

## 2015-12-19 ENCOUNTER — Encounter: Payer: Self-pay | Admitting: Internal Medicine

## 2015-12-19 NOTE — Telephone Encounter (Signed)
Patient was in for an office visit to see Dr. Okey DupreEnd yesterday and while sitting in the waiting room he became diaphoretic and unresponsive. We moved the patient to the floor and raised his legs per Dr. Gearlean AlfIngal's instructions, code was called and Dr. Mariah MillingGollan was also present. Patient maintained a pulse throughout. Code team arrived and began to assess patient. IV was placed by Tammy SoursGreg RN to left forearm and patients blood sugar was 36. One amp of dextrose was given and patient was then transferred to stretcher and taken to ED for further evaluation.

## 2016-01-01 ENCOUNTER — Ambulatory Visit (INDEPENDENT_AMBULATORY_CARE_PROVIDER_SITE_OTHER): Payer: Medicare Other

## 2016-01-01 ENCOUNTER — Encounter: Payer: Self-pay | Admitting: Internal Medicine

## 2016-01-01 ENCOUNTER — Ambulatory Visit: Payer: Medicaid Other | Admitting: Internal Medicine

## 2016-01-01 ENCOUNTER — Ambulatory Visit (INDEPENDENT_AMBULATORY_CARE_PROVIDER_SITE_OTHER): Payer: Medicare Other | Admitting: Internal Medicine

## 2016-01-01 VITALS — BP 100/64 | HR 81 | Ht 68.0 in | Wt 158.5 lb

## 2016-01-01 DIAGNOSIS — R002 Palpitations: Secondary | ICD-10-CM | POA: Diagnosis not present

## 2016-01-01 NOTE — Progress Notes (Signed)
Follow-up Outpatient Visit Date: 01/01/2016  Chief Complaint: Fatigue and palpitations  HPI:  [name] is a 60 y.o. year-old male with history of coronary artery disease status post 2 vessel [facility]2014 complicated by postop atrial fibrillation, stroke in 08/2015 with residual left-sided weakness, hypertension, diabetes mellitus, and [name]-stage renal disease on hemodialysis, who returns for follow-up after recent echo and stress test in anticipation of HD fisutula treation. History is obtained with the assistance of a Spanish interpreter. Today, the patient reports feeling well with the exception of continued fatigue.  This is not significantly different than at his prior visit with me in September.  At that time, we decreased his blood pressure medications due to borderline hypotension.  He reports being compliant with his medications and hemodialysis.  He notes daily episodes of palpitations lasting ~15 minutes.  They often occur when he starts walking, but can also start at rest.  He denies associated symptoms.  He has not had any chest pain, shortness of breath, lightheadedness, orthopnea, PND, and leg swelling.  The patient was hospitalized in 08/2015 at [facility] in [place], [place] with chest pain and shortness of breath. This time, he was found to have a non-ST segment elevation MI that was complicated by acute decompensated heart failure, acute stroke, and advanced renal failure. He was started on hemodialysis and did not undergo cardiac catheterization due to the aforementioned stroke. He was treated medically with improvement in his mental status is felt to be due to a combination of stroke, uremia, and heart failure. At that time, he was noted to have severely reduced LV systolic function with an EF of 30-35%. He was also very hypertensive at that time requiring multiple blood pressure agents. The patient notes that since that time, his blood pressure has been  well-controlled or even low, most often shortly after his dialysis session.  [name] was previously [facility], having last seen Dr. Erline LevineBode in 08/2014.  At that time, the patient was doing well. Echocardiogram following CABG revealed improved systolic function from 30-35% up to 45-50%. The patient also reports that he may need carotid artery intervention on the right at some point in the future but does not know specifics. He has been referred to [facility] for [facility], though he would prefer to have this done [name]. Of note, carotid Doppler performed at [facility] in 08/2015 showed 41-59% stenosis in the proximal right internal carotid artery. There is no significant left internal carotid artery stenosis.  The patient reports that recent carotid [facility] at [facility] did not reveal severe stenosis that would warrant intervention at this time.  At his last scheduled visit with us, [name] presented with altered mental status in the setting of severe hypoglycemia.  He was taken to the ED for treatment before he could be seen in the office.  He reports feeling back to his baseline with improved control of his blood sugars.  --------------------------------------------------------------------------------------------------  Cardiovascular History & Procedures: Cardiovascular Problems:  CAD s/p CABG (2015)  Post-operative atrial fibrillation (paroxysmal)  Stroke  Risk Factors:  Hypertension, hyperlipidemia, diabetes mellitus, male gender, age, and known coronary artery disease  Cath/PCI:  LHC (01/24/14, [facility]): Diffuse LMCA disease up to 90%.  LAD with 80% mid and 99% apical stenoses.  LCx without disease, though OM1 has diffuse disease.  RCA with moderate diffuse disease and 99% lesion in midportion of PDA.  CV Surgery:  [facility] LIMA -> LAD and SVG ->  OM  EP Procedures and Devices:  None  Non-Invasive Evaluation(s):  Pharmacologic stress test (12/05/15): Low  risk study with small in size, mild in severity mid and apical anterior defect representing scar vs artifact.  No ischemia.  LVEF >65%.  TTE (11/21/15): Normal LV size and function (EF 60-65%).  Grade 1 diastolic dysfunction.  Aortic sclerosis without stenosis.  Normal RV size and function.  Normal PA pressure.  Recent CV Pertinent Labs: Lab Results  Component Value Date   INR 1.01 11/05/2015   K 4.6 12/18/2015   BUN 72 (H) 12/18/2015   CREATININE 7.78 (H) 12/18/2015    Past medical and surgical history were reviewed and updated in EPIC.   Outpatient Encounter Prescriptions as of 01/01/2016  Medication Sig  . aspirin EC 81 MG tablet Take 81 mg by mouth daily.  [name]. atorvastatin (LIPITOR) 40 MG tablet Take 80 mg by mouth daily at 6 PM.  . carvedilol (COREG) 12.5 MG tablet Take 12.5 mg by mouth 2 (two) times daily with a meal.  . clopidogrel (PLAVIX) 75 MG tablet Take 75 mg by mouth daily.  [name]. glipiZIDE (GLUCOTROL) 5 MG tablet Take 10 mg by mouth 2 (two) times daily before a meal.  . insulin glargine (LANTUS) 100 UNIT/ML injection Inject 19 Units into the skin at bedtime.  . insulin regular (NOVOLIN R,HUMULIN R) 100 units/mL injection Inject 10 Units into the skin 3 (three) times daily before meals. 0-10 units  . isosorbide dinitrate (ISORDIL) 30 MG tablet Take 30 mg by mouth daily.  [name]. lisinopril (PRINIVIL,ZESTRIL) 2.5 MG tablet Take 2.5 mg by mouth daily.  . megestrol (MEGACE) 20 MG tablet Take 20 mg by mouth 2 (two) times daily.  . Melatonin 3 MG TABS Take 1 tablet by mouth at bedtime as needed.  . nitroGLYCERIN (NITROSTAT) 0.4 MG SL tablet Place 0.4 mg under the tongue every 5 (five) minutes as needed for chest pain.  . sevelamer carbonate (RENVELA) 800 MG tablet Take 1,600 mg by mouth 3 (three) times daily with meals.   . tamsulosin (FLOMAX) 0.4 MG CAPS capsule Take 0.4 mg by mouth daily after breakfast.   No facility-administered encounter medications on file as of 01/01/2016.      Allergies: Review of patient's allergies indicates no known allergies.  Social History   Social History  . Marital status: Single    Spouse name: N/A  . Number of children: N/A  . Years of education: N/A   Occupational History  . Not on file.   Social History Main Topics  . Smoking status: Former Smoker    Packs/day: 0.50    Years: 45.00    Quit date: 01/05/2015  . Smokeless tobacco: Never Used  . Alcohol use No  . Drug use: No  . Sexual activity: Not on file   Other Topics Concern  . Not on file   Social History Narrative  . No narrative on file    Family History  Problem Relation Age of Onset  . Cancer Mother   . Diabetes Mother   . Diabetes Father     Review of Systems: A 12-system review of systems was performed and was negative except as noted in the HPI.  --------------------------------------------------------------------------------------------------  Physical Exam: BP 100/64 (BP Location: Left Arm, Patient Position: Sitting, Cuff Size: Normal)   Pulse 81   Ht 5\' 8"  (1.727 m)   Wt 71.9 kg (158 lb 8 oz)   BMI 24.10 kg/m   General:  Well-developed, well-nourished man  seated comfortably in the exam room.  He is accompanied by his daughter. HEENT: No conjunctival pallor or scleral icterus.  Moist mucous membranes.  OP clear. Neck: Supple without lymphadenopathy, thyromegaly, JVD, or HJR.  No carotid bruit. Lungs: Normal work of breathing.  Clear to auscultation bilaterally without wheezes or crackles. Heart: Regular rate and rhythm without murmurs, rubs, or gallops.  Non-displaced PMI. Abd: Bowel sounds present.  Soft, NT/ND without hepatosplenomegaly Ext: No lower extremity edema.  Radial, PT, and DP pulses are 2+ bilaterally. Skin: warm and dry without rash.  Right chest HD catheter in place.  Lab Results  Component Value Date   WBC 5.8 12/18/2015   HGB 11.1 (L) 12/18/2015   HCT 33.7 (L) 12/18/2015   MCV 87.8 12/18/2015   PLT 233 12/18/2015     Lab Results  Component Value Date   NA 140 12/18/2015   K 4.6 12/18/2015   CL 101 12/18/2015   CO2 27 12/18/2015   BUN 72 (H) 12/18/2015   CREATININE 7.78 (H) 12/18/2015   GLUCOSE 151 (H) 12/18/2015   Lipid panel (09/08/15, [facility]): Total cholesterol 213, triglycerides 213, HDL 47, LDL 124  --------------------------------------------------------------------------------------------------  ASSESSMENT AND PLAN: Preoperative cardiovascular assessment Patient continues to have fatigue but no angina or shortness of breath to suggest unstable cardiovascular disease. Recent echocardiogram and myocardial perfusion stress test were reassuring with normal LV contraction and no significant ischemia. At this time, the patient can proceed with planned hemodialysis fistula placement. Though he is at elevated risk due to his multiple comorbidities, further intervention or testing will not further mitigate his risk.  Coronary artery disease As above, his recent stress test was reassuring. We will continue his current regimen for secondary prevention including high intensity statin therapy and carvedilol. Given medically managed and STEMI this summer, he will complete one year of dual antiplatelet therapy with aspirin and clopidogrel. Thereafter, he can be switched to aspirin monotherapy.  Ischemic cardiomyopathy LVEF has normalized. Patient appears euvolemic and well compensated on exam today with NYHA class II-III symptoms. We will continue with his current lisinopril and carvedilol regimen.  Hypertension Blood pressure is low normal today. We will not make any further changes at this time.  Palpitations These have been occurring on a daily basis. Given history of a single episode of atrial fibrillation following CABG and stroke during hospitalization this summer, paroxysmal atrial fibrillation is a concern. We have agreed to place a 14 days I'll patch monitor for further evaluation. If there  is a evidence of atrial fibrillation, we will need to consider starting warfarin given elevated CHADSVASc score (4).  Follow-up: Return to clinic in 3 months.  Yvonne Kendall, MD 01/02/2016 6:57 AM

## 2016-01-01 NOTE — Patient Instructions (Addendum)
Medication Instructions:  Your physician recommends that you continue on your current medications as directed. Please refer to the Current Medication list given to you today.  Labwork: NONE  Testing/Procedures: Long Term Monitor for 14 days  Follow-Up: Your physician recommends that you schedule a follow-up appointment in: 3 months with Dr. Okey DupreEnd.   If you need a refill on your cardiac medications before your next appointment, please call your pharmacy.

## 2016-01-02 ENCOUNTER — Encounter: Payer: Self-pay | Admitting: Internal Medicine

## 2016-01-03 ENCOUNTER — Other Ambulatory Visit (INDEPENDENT_AMBULATORY_CARE_PROVIDER_SITE_OTHER): Payer: Self-pay | Admitting: Vascular Surgery

## 2016-01-03 ENCOUNTER — Encounter (INDEPENDENT_AMBULATORY_CARE_PROVIDER_SITE_OTHER): Payer: Self-pay

## 2016-01-06 ENCOUNTER — Encounter
Admission: RE | Admit: 2016-01-06 | Discharge: 2016-01-06 | Disposition: A | Discharge status: Home / Self Care | Payer: Medicare Other | Source: Ambulatory Visit | Attending: Vascular Surgery | Admitting: Vascular Surgery

## 2016-01-06 ENCOUNTER — Ambulatory Visit (INDEPENDENT_AMBULATORY_CARE_PROVIDER_SITE_OTHER): Payer: Medicare Other | Admitting: Vascular Surgery

## 2016-01-06 ENCOUNTER — Encounter (INDEPENDENT_AMBULATORY_CARE_PROVIDER_SITE_OTHER): Payer: Self-pay | Admitting: Vascular Surgery

## 2016-01-06 VITALS — BP 149/68 | HR 78 | Resp 16 | Ht 68.0 in | Wt 163.2 lb

## 2016-01-06 DIAGNOSIS — N186 End stage renal disease: Secondary | ICD-10-CM | POA: Diagnosis not present

## 2016-01-06 DIAGNOSIS — I255 Ischemic cardiomyopathy: Secondary | ICD-10-CM

## 2016-01-06 DIAGNOSIS — Z992 Dependence on renal dialysis: Secondary | ICD-10-CM | POA: Diagnosis not present

## 2016-01-06 DIAGNOSIS — E118 Type 2 diabetes mellitus with unspecified complications: Secondary | ICD-10-CM | POA: Diagnosis not present

## 2016-01-06 DIAGNOSIS — Z01810 Encounter for preprocedural cardiovascular examination: Secondary | ICD-10-CM | POA: Insufficient documentation

## 2016-01-06 DIAGNOSIS — I159 Secondary hypertension, unspecified: Secondary | ICD-10-CM | POA: Diagnosis not present

## 2016-01-06 DIAGNOSIS — Z01812 Encounter for preprocedural laboratory examination: Secondary | ICD-10-CM | POA: Diagnosis not present

## 2016-01-06 LAB — CBC WITH DIFFERENTIAL/PLATELET
BASOS ABS: 0.1 10*3/uL (ref 0–0.1)
BASOS PCT: 1 %
Eosinophils Absolute: 0.3 10*3/uL (ref 0–0.7)
Eosinophils Relative: 5 %
HEMATOCRIT: 30.7 % — AB (ref 40.0–52.0)
HEMOGLOBIN: 10.3 g/dL — AB (ref 13.0–18.0)
Lymphocytes Relative: 20 %
Lymphs Abs: 1.1 10*3/uL (ref 1.0–3.6)
MCH: 29.2 pg (ref 26.0–34.0)
MCHC: 33.5 g/dL (ref 32.0–36.0)
MCV: 87.2 fL (ref 80.0–100.0)
Monocytes Absolute: 0.5 10*3/uL (ref 0.2–1.0)
Monocytes Relative: 8 %
NEUTROS ABS: 3.6 10*3/uL (ref 1.4–6.5)
NEUTROS PCT: 66 %
Platelets: 244 10*3/uL (ref 150–440)
RBC: 3.53 MIL/uL — AB (ref 4.40–5.90)
RDW: 14.5 % (ref 11.5–14.5)
WBC: 5.5 10*3/uL (ref 3.8–10.6)

## 2016-01-06 LAB — BASIC METABOLIC PANEL
ANION GAP: 15 (ref 5–15)
BUN: 66 mg/dL — ABNORMAL HIGH (ref 6–20)
CALCIUM: 8.9 mg/dL (ref 8.9–10.3)
CO2: 23 mmol/L (ref 22–32)
Chloride: 100 mmol/L — ABNORMAL LOW (ref 101–111)
Creatinine, Ser: 8.75 mg/dL — ABNORMAL HIGH (ref 0.61–1.24)
GFR, EST AFRICAN AMERICAN: 7 mL/min — AB (ref >60)
GFR, EST NON AFRICAN AMERICAN: 6 mL/min — AB (ref >60)
Glucose, Bld: 162 mg/dL — ABNORMAL HIGH (ref 65–99)
Potassium: 4.3 mmol/L (ref 3.5–5.1)
Sodium: 138 mmol/L (ref 135–145)

## 2016-01-06 LAB — APTT: APTT: 30 s (ref 24–36)

## 2016-01-06 LAB — TYPE AND SCREEN
ABO/RH(D): A POS
Antibody Screen: NEGATIVE

## 2016-01-06 LAB — PROTIME-INR
INR: 1.02
Prothrombin Time: 13.4 seconds (ref 11.4–15.2)

## 2016-01-06 LAB — SURGICAL PCR SCREEN
MRSA, PCR: NEGATIVE
Staphylococcus aureus: NEGATIVE

## 2016-01-06 NOTE — Patient Instructions (Signed)
Your procedure is scheduled on: January 10, 2016 (Friday) Su procedimiento est programado para: Report to Day Surgery. Second Floor, Medical Mall Presntese a: To find out your arrival time please call [phone] between 1PM - 3PM on January 09, 2016 (Thursday) Para saber su hora de llegada por favor llame al [phone] entre la 1PM - 3PM el da:  Remember: Instructions that are not followed completely may result in serious medical risk, up to and including death, or upon the discretion of your surgeon and anesthesiologist your surgery may need to be rescheduled.  Recuerde: Las instrucciones que no se siguen completamente Armed forces logistics/support/administrative officerpueden resultar en un riesgo de salud grave, incluyendo hasta la [place]muerte o a discrecin de su cirujano y Scientific laboratory techniciananestesilogo, su ciruga se puede posponer.  _x_ 1. Do not eat food or drink liquids after midnight. No gum chewing or hard candies.  No coma alimentos ni tome lquidos despus de la medianoche.  No mastique chicle ni caramelos  duros.     __x__ 2. No alcohol for 24 hours before or after surgery.    No tome alcohol durante las 24 horas antes ni despus de la Azerba[place].   ____ 3. Bring all medications with you on the day of surgery if instructed.    Lleve todos los medicamentos con usted el da de su ciruga si se le ha indicado as.   __x__ 4. Notify your doctor if there is any change in your medical condition (cold, fever,                             infections).    Informe a su mdico si hay algn cambio en su condicin mdica (resfriado, fiebre, infecciones).   Do not wear jewelry, make-up, hairpins, clips or nail polish.  No use joyas, maquillajes, pinzas/ganchos para el cabello ni esmalte de uas.  Do not wear lotions, powders, or perfumes. You may wear deodorant.  No use lociones, polvos o perfumes.  Puede usar desodorante.    Do not shave 48 hours prior to surgery. Men may shave face and neck.  No se afeite 48 horas antes de la Azerba[place]uga.  Los hombres  pueden Commercial Metals Companyafeitarse la cara y el cuello.   Do not bring valuables to the hospital.   No lleve objetos de valor al hospital.  [facility] is not responsible for any belongings or valuables.  [place] no se hace responsable de ningn tipo de pertenencias u objetos de Licensed conveyancervalor.               Contacts, dentures or bridgework may not be worn into surgery.  Los lentes de [place], las dentaduras postizas o puentes no se pueden usar en la [place]uga.  Leave your suitcase in the car. After surgery it may be brought to your room.  Deje su maleta en el auto.  Despus de la ciruga podr traerla a su habitacin.  For patients admitted to the hospital, discharge time is determined by your treatment team.  Para los pacientes que sean ingresados al hospital, el tiempo en el cual se le dar de alta es determinado por su                equipo de Hemettratamiento.   Patients discharged the day of surgery will not be allowed to drive home. A los pacientes que se les da de alta el mismo da de la ciruga no se les permitir conducir a Higher education careers advisercasa.   Please  read over the following fact sheets that you were given: Por favor lea las siguientes hojas de informacin que le dieron:   MRSA Information   _x__ Take these medicines the morning of surgery with A SIP OF WATER:          Owens-Illinoisome estas medicinas la maana de la ciruga con UN SORBO DE AGUA:  1. Carvedilol  2. Isosorbide  3. Lisinopril  4.       5.  6.  ____ Fleet Enema (as directed)          Enema de Fleet (segn lo indicado)    ____ Use CHG Soap as directed          Utilice el jabn de CHG segn lo indicado  ____ Use inhalers on the day of surgery          Use los inhaladores el da de la ciruga  ____ Stop metformin 2 days prior to surgery          Deje de tomar el metformin 2 das antes de la ciruga    _x___ Take 1/2 of usual insulin dose the night before surgery and none on the morning of surgery           Tome la mitad de la dosis habitual de insulina la  noche antes de la Azerba[place]uga y no tome nada en la maana de la             ciruga  (Take one-half of Lantus at bedtime and no insulin the morning of surgery)  __x__ Stop Coumadin/Plavix/aspirin on(Check with Dr. Gilda CreaseSchnier office about stopping Plavix)          Deje de tomar el Coumadin/Plavix/aspirina el da:  __x__ Stop Anti-inflammatories on (No Aspirin Products )          Deje de tomar Doctor, general practiceantiinflamatorios el da:   __x__ Stop supplements until after surgery  (Stop Melatonin now)       Deje de tomar suplementos hasta despus de la ciruga  ____ Bring C-Pap to the hospital          Lleve el C-Pap al hospital

## 2016-01-06 NOTE — Progress Notes (Signed)
Subjective:    Patient ID: Eddie DraftsMauro Myers, male    DOB: [birth year], 60 y.o.   MRN: [id number] Chief Complaint  Patient presents with  . Follow-up   Patient presents for a H&P update. He is scheduled to undergo a left extremity brach-axillary graft. He his medications have been updated. He denies any new health issues or recent sickness. We discussed his procedure in length and I answered any questions. He denies chest pain, shortness of breath, abdominal pain or changes in his bowel habits.    Review of Systems  Constitutional: Negative.   HENT: Negative.   Eyes: Negative.   Respiratory: Negative.   Cardiovascular: Negative.   Gastrointestinal: Negative.   Endocrine: Negative.   Genitourinary:       ESRD  Musculoskeletal: Negative.   Skin: Negative.   Allergic/Immunologic: Negative.   Neurological: Negative.   Hematological: Negative.   Psychiatric/Behavioral: Negative.        Objective:   Physical Exam  Constitutional: He is oriented to person, place, and time. He appears well-developed and well-nourished.  HENT:  Head: Normocephalic and atraumatic.  Eyes: Conjunctivae and EOM are normal. Pupils are equal, round, and reactive to light.  Neck: Normal range of motion.  Cardiovascular: Normal rate, regular rhythm, normal heart sounds and intact distal pulses.   Pulses:      Radial pulses are 2+ on the right side, and 2+ on the left side.       Dorsalis pedis pulses are 2+ on the right side, and 2+ on the left side.       Posterior tibial pulses are 2+ on the right side, and 2+ on the left side.  Pulmonary/Chest: Effort normal and breath sounds normal. No respiratory distress. He has no wheezes.  Permcath: Intact. No signs of infection noted.   Abdominal: Soft. Bowel sounds are normal. He exhibits no distension. There is no tenderness. There is no rebound.  Musculoskeletal: Normal range of motion. He exhibits no edema.  Neurological: He is alert and oriented to person,  place, and time.  Skin: Skin is warm and dry.  Psychiatric: He has a normal mood and affect. His behavior is normal. Judgment and thought content normal.   BP (!) 149/68   Pulse 78   Resp 16   Ht 5\' 8"  (1.727 m)   Wt 163 lb 3.2 oz (74 kg)   BMI 24.81 kg/m   Past Medical History:  Diagnosis Date  . Anginal pain (HCC)   . Coronary artery disease   . Diabetes mellitus without complication (HCC)   . Elevated lipids   . Enlarged prostate   . ESRD (end stage renal disease) (HCC)    dialysis M-W-F  . Hypertension   . Myocardial infarction   . Stroke [facility](HCC)    Lt side this year July   Social History   Social History  . Marital status: Single    Spouse name: N/A  . Number of children: N/A  . Years of education: N/A   Occupational History  . Not on file.   Social History Main Topics  . Smoking status: Former Smoker    Packs/day: 0.50    Years: 45.00    Quit date: 01/05/2015  . Smokeless tobacco: Never Used  . Alcohol use No  . Drug use: No  . Sexual activity: Not on file   Other Topics Concern  . Not on file   Social History Narrative  . No narrative on file   Past Surgical  History:  Procedure Laterality Date  . CORONARY ARTERY BYPASS GRAFT  01/2014   [facility] (LIMA -> LAD and SVG -> OM)   Family History  Problem Relation Age of Onset  . Cancer Mother   . Diabetes Mother   . Diabetes Father    No Known Allergies     Assessment & Plan:  Patient presents for a H&P update. He is scheduled to undergo a left extremity brach-axillary graft. He his medications have been updated. He denies any new health issues or recent sickness. We discussed his procedure in length and I answered any questions. He denies chest pain, shortness of breath, abdominal pain or changes in his bowel habits.   1. ESRD on dialysis (HCC) - Stable He is scheduled to undergo a left extremity brach-axillary graft. Currently maintained by a permcath without issue.   2. Type 2 diabetes  mellitus with complication, unspecified long term insulin use status (HCC) - Stable Encouraged good control as its slows the progression of atherosclerotic disease  3. Secondary hypertension - Stable Encouraged good control as its slows the progression of atherosclerotic disease  Current Outpatient Prescriptions on File Prior to Visit  Medication Sig Dispense Refill  . aspirin EC 81 MG tablet Take 81 mg by mouth daily.    [name]. atorvastatin (LIPITOR) 40 MG tablet Take 80 mg by mouth daily at 6 PM.    . carvedilol (COREG) 12.5 MG tablet Take 12.5 mg by mouth 2 (two) times daily with a meal.    . clopidogrel (PLAVIX) 75 MG tablet Take 75 mg by mouth daily.    [name]. glipiZIDE (GLUCOTROL) 5 MG tablet Take 10 mg by mouth 2 (two) times daily before a meal.    . insulin glargine (LANTUS) 100 UNIT/ML injection Inject 19 Units into the skin at bedtime.    . insulin regular (NOVOLIN R,HUMULIN R) 100 units/mL injection Inject 10 Units into the skin 3 (three) times daily before meals. 0-10 units    . isosorbide dinitrate (ISORDIL) 30 MG tablet Take 30 mg by mouth daily.    [name]. lisinopril (PRINIVIL,ZESTRIL) 2.5 MG tablet Take 2.5 mg by mouth daily.    . megestrol (MEGACE) 20 MG tablet Take 20 mg by mouth 2 (two) times daily.    . Melatonin 3 MG TABS Take 1 tablet by mouth at bedtime as needed.    . nitroGLYCERIN (NITROSTAT) 0.4 MG SL tablet Place 0.4 mg under the tongue every 5 (five) minutes as needed for chest pain.    . sevelamer carbonate (RENVELA) 800 MG tablet Take 1,600 mg by mouth 3 (three) times daily with meals.     . tamsulosin (FLOMAX) 0.4 MG CAPS capsule Take 0.4 mg by mouth daily after breakfast.     No current facility-administered medications on file prior to visit.    There are no Patient Instructions on file for this visit. No Follow-up on file.  Darionna Banke A Nakisha Chai, PA-C

## 2016-01-07 LAB — NM MYOCAR MULTI W/SPECT W/WALL MOTION / EF
CHL CUP NUCLEAR SSS: 5
CSEPHR: 67 %
CSEPPHR: 108 {beats}/min
LV sys vol: 18 mL
LVDIAVOL: 58 mL (ref 62–150)
NUC STRESS TID: 1.28
Rest HR: 85 {beats}/min
SDS: 3
SRS: 3

## 2016-01-07 NOTE — Pre-Procedure Instructions (Signed)
Met B and CBC sent to Dr. Delana Meyer and Anesthesia for review.

## 2016-01-10 ENCOUNTER — Encounter
Admission: RE | Disposition: A | Discharge status: Home / Self Care | Payer: Self-pay | Source: Ambulatory Visit | Attending: Vascular Surgery

## 2016-01-10 ENCOUNTER — Ambulatory Visit: Payer: Medicare Other | Admitting: Anesthesiology

## 2016-01-10 ENCOUNTER — Ambulatory Visit
Admission: RE | Admit: 2016-01-10 | Discharge: 2016-01-10 | Disposition: A | Discharge status: Home / Self Care | Payer: Medicare Other | Source: Ambulatory Visit | Attending: Vascular Surgery | Admitting: Vascular Surgery

## 2016-01-10 DIAGNOSIS — E1122 Type 2 diabetes mellitus with diabetic chronic kidney disease: Secondary | ICD-10-CM | POA: Diagnosis present

## 2016-01-10 DIAGNOSIS — I25119 Atherosclerotic heart disease of native coronary artery with unspecified angina pectoris: Secondary | ICD-10-CM | POA: Diagnosis not present

## 2016-01-10 DIAGNOSIS — N186 End stage renal disease: Secondary | ICD-10-CM | POA: Insufficient documentation

## 2016-01-10 DIAGNOSIS — Z951 Presence of aortocoronary bypass graft: Secondary | ICD-10-CM | POA: Insufficient documentation

## 2016-01-10 DIAGNOSIS — Z8673 Personal history of transient ischemic attack (TIA), and cerebral infarction without residual deficits: Secondary | ICD-10-CM | POA: Diagnosis not present

## 2016-01-10 DIAGNOSIS — D631 Anemia in chronic kidney disease: Secondary | ICD-10-CM | POA: Insufficient documentation

## 2016-01-10 DIAGNOSIS — I252 Old myocardial infarction: Secondary | ICD-10-CM | POA: Insufficient documentation

## 2016-01-10 DIAGNOSIS — I120 Hypertensive chronic kidney disease with stage 5 chronic kidney disease or end stage renal disease: Secondary | ICD-10-CM | POA: Diagnosis not present

## 2016-01-10 HISTORY — PX: AV FISTULA PLACEMENT: SHX1204

## 2016-01-10 LAB — POCT I-STAT 4, (NA,K, GLUC, HGB,HCT)
Glucose, Bld: 189 mg/dL — ABNORMAL HIGH (ref 65–99)
HEMATOCRIT: 33 % — AB (ref 39.0–52.0)
HEMOGLOBIN: 11.2 g/dL — AB (ref 13.0–17.0)
Potassium: 4.4 mmol/L (ref 3.5–5.1)
SODIUM: 138 mmol/L (ref 135–145)

## 2016-01-10 LAB — GLUCOSE, CAPILLARY
Glucose-Capillary: 201 mg/dL — ABNORMAL HIGH (ref 65–99)
Glucose-Capillary: 118 mg/dL — ABNORMAL HIGH (ref 65–99)

## 2016-01-10 SURGERY — INSERTION OF ARTERIOVENOUS (AV) GORE-TEX GRAFT ARM
Anesthesia: General | Laterality: Left | Wound class: Clean

## 2016-01-10 MED ORDER — CHLORHEXIDINE GLUCONATE CLOTH 2 % EX PADS: 6.0000 | MEDICATED_PAD | Freq: Once | CUTANEOUS | Status: DC

## 2016-01-10 MED ORDER — BUPIVACAINE HCL (PF) 0.5 % IJ SOLN
INTRAMUSCULAR | Status: AC
  Filled 2016-01-10: qty 30

## 2016-01-10 MED ORDER — FENTANYL CITRATE (PF) 100 MCG/2ML IJ SOLN
INTRAMUSCULAR | Status: DC | PRN
  Administered 2016-01-10: 25 ug via INTRAVENOUS
  Administered 2016-01-10: 50 ug via INTRAVENOUS
  Administered 2016-01-10: 25 ug via INTRAVENOUS

## 2016-01-10 MED ORDER — GLYCOPYRROLATE 0.2 MG/ML IJ SOLN
INTRAMUSCULAR | Status: DC | PRN
Start: 2016-01-10 — End: 2016-01-10
  Administered 2016-01-10: 0.2 mg via INTRAVENOUS

## 2016-01-10 MED ORDER — CEFAZOLIN SODIUM-DEXTROSE 2-4 GM/100ML-% IV SOLN
INTRAVENOUS | Status: AC
  Administered 2016-01-10: 2 g via INTRAVENOUS
  Filled 2016-01-10: qty 100

## 2016-01-10 MED ORDER — SODIUM CHLORIDE 0.9 % IV SOLN
INTRAVENOUS | Status: DC
  Administered 2016-01-10: 11:00:00 via INTRAVENOUS

## 2016-01-10 MED ORDER — SODIUM CHLORIDE 0.9 % IV SOLN
INTRAVENOUS | Status: DC | PRN
  Administered 2016-01-10: 75 ug/min via INTRAVENOUS

## 2016-01-10 MED ORDER — LIDOCAINE HCL (CARDIAC) 20 MG/ML IV SOLN
INTRAVENOUS | Status: DC | PRN
  Administered 2016-01-10: 25 mg via INTRAVENOUS

## 2016-01-10 MED ORDER — FAMOTIDINE 20 MG PO TABS
ORAL_TABLET | ORAL | Status: AC
  Administered 2016-01-10: 20 mg via ORAL
  Filled 2016-01-10: qty 1

## 2016-01-10 MED ORDER — KETAMINE HCL 50 MG/ML IJ SOLN
INTRAMUSCULAR | Status: DC | PRN
  Administered 2016-01-10: 25 mg via INTRAMUSCULAR

## 2016-01-10 MED ORDER — FAMOTIDINE 20 MG PO TABS
20.0000 mg | ORAL_TABLET | Freq: Once | ORAL | Status: AC
  Administered 2016-01-10: 20 mg via ORAL

## 2016-01-10 MED ORDER — ONDANSETRON HCL 4 MG/2ML IJ SOLN
INTRAMUSCULAR | Status: DC | PRN
  Administered 2016-01-10: 4 mg via INTRAVENOUS

## 2016-01-10 MED ORDER — SODIUM CHLORIDE 0.9 % IV SOLN
INTRAVENOUS | Status: DC | PRN
  Administered 2016-01-10: 40 mL via INTRAMUSCULAR

## 2016-01-10 MED ORDER — BUPIVACAINE HCL (PF) 0.5 % IJ SOLN
INTRAMUSCULAR | Status: DC | PRN
  Administered 2016-01-10: 30 mL

## 2016-01-10 MED ORDER — CEFAZOLIN SODIUM-DEXTROSE 2-4 GM/100ML-% IV SOLN
2.0000 g | INTRAVENOUS | Status: AC
  Administered 2016-01-10 (×2): 2 g via INTRAVENOUS

## 2016-01-10 MED ORDER — FENTANYL CITRATE (PF) 100 MCG/2ML IJ SOLN
INTRAMUSCULAR | Status: AC
  Administered 2016-01-10: 25 ug via INTRAVENOUS
  Filled 2016-01-10: qty 2

## 2016-01-10 MED ORDER — PROPOFOL 10 MG/ML IV BOLUS
INTRAVENOUS | Status: DC | PRN
  Administered 2016-01-10: 100 mg via INTRAVENOUS
  Administered 2016-01-10: 20 mg via INTRAVENOUS

## 2016-01-10 MED ORDER — HEPARIN SODIUM (PORCINE) 5000 UNIT/ML IJ SOLN
INTRAMUSCULAR | Status: AC
  Filled 2016-01-10: qty 1

## 2016-01-10 MED ORDER — MIDAZOLAM HCL 2 MG/2ML IJ SOLN
INTRAMUSCULAR | Status: DC | PRN
  Administered 2016-01-10: 1 mg via INTRAVENOUS

## 2016-01-10 MED ORDER — HYDROCODONE-ACETAMINOPHEN 5-325 MG PO TABS: 1.0000 | ORAL_TABLET | Freq: Four times a day (QID) | ORAL | 0 refills | Status: DC | PRN

## 2016-01-10 MED ORDER — FENTANYL CITRATE (PF) 100 MCG/2ML IJ SOLN
25.0000 ug | INTRAMUSCULAR | Status: DC | PRN
  Administered 2016-01-10 (×2): 25 ug via INTRAVENOUS

## 2016-01-10 MED ORDER — PHENYLEPHRINE HCL 10 MG/ML IJ SOLN
INTRAMUSCULAR | Status: DC | PRN
  Administered 2016-01-10 (×2): 100 ug via INTRAVENOUS
  Administered 2016-01-10 (×2): 200 ug via INTRAVENOUS
  Administered 2016-01-10: 100 ug via INTRAVENOUS
  Administered 2016-01-10: 200 ug via INTRAVENOUS

## 2016-01-10 MED ORDER — EPHEDRINE SULFATE 50 MG/ML IJ SOLN
INTRAMUSCULAR | Status: DC | PRN
  Administered 2016-01-10 (×2): 10 mg via INTRAVENOUS

## 2016-01-10 MED ORDER — ONDANSETRON HCL 4 MG/2ML IJ SOLN: 4.0000 mg | Freq: Once | INTRAMUSCULAR | Status: DC | PRN

## 2016-01-10 SURGICAL SUPPLY — 57 items
APPLIER CLIP 11 MED OPEN (CLIP)
APPLIER CLIP 9.375 SM OPEN (CLIP) ×3
BAG COUNTER SPONGE EZ (MISCELLANEOUS) IMPLANT
BAG DECANTER FOR FLEXI CONT (MISCELLANEOUS) ×3 IMPLANT
BLADE SURG SZ11 CARB STEEL (BLADE) ×3 IMPLANT
BOOT SUTURE AID YELLOW STND (SUTURE) ×3 IMPLANT
BRUSH SCRUB 4% CHG (MISCELLANEOUS) ×3 IMPLANT
CANISTER SUCT 1200ML W/VALVE (MISCELLANEOUS) ×3 IMPLANT
CHLORAPREP W/TINT 26ML (MISCELLANEOUS) ×3 IMPLANT
CLIP APPLIE 11 MED OPEN (CLIP) IMPLANT
CLIP APPLIE 9.375 SM OPEN (CLIP) ×1 IMPLANT
COUNTER SPONGE BAG EZ (MISCELLANEOUS)
DERMABOND ADVANCED (GAUZE/BANDAGES/DRESSINGS) ×2
DERMABOND ADVANCED .7 DNX12 (GAUZE/BANDAGES/DRESSINGS) ×1 IMPLANT
DRESSING SURGICEL FIBRLLR 1X2 (HEMOSTASIS) ×1 IMPLANT
DRSG SURGICEL FIBRILLAR 1X2 (HEMOSTASIS) ×3
ELECT CAUTERY BLADE 6.4 (BLADE) ×3 IMPLANT
ELECT REM PT RETURN 9FT ADLT (ELECTROSURGICAL) ×3
ELECTRODE REM PT RTRN 9FT ADLT (ELECTROSURGICAL) ×1 IMPLANT
GLOVE BIO SURGEON STRL SZ7 (GLOVE) ×9 IMPLANT
GLOVE INDICATOR 7.5 STRL GRN (GLOVE) ×6 IMPLANT
GLOVE SURG SYN 8.0 (GLOVE) ×3 IMPLANT
GOWN STRL REUS W/ TWL LRG LVL3 (GOWN DISPOSABLE) ×2 IMPLANT
GOWN STRL REUS W/ TWL XL LVL3 (GOWN DISPOSABLE) ×1 IMPLANT
GOWN STRL REUS W/TWL LRG LVL3 (GOWN DISPOSABLE) ×4
GOWN STRL REUS W/TWL XL LVL3 (GOWN DISPOSABLE) ×2
GRAFT PROPATEN STD WALL 4 7X45 (Vascular Products) ×3 IMPLANT
IV NS 500ML (IV SOLUTION) ×2
IV NS 500ML BAXH (IV SOLUTION) ×1 IMPLANT
KIT RM TURNOVER STRD PROC AR (KITS) ×3 IMPLANT
LABEL OR SOLS (LABEL) ×3 IMPLANT
LOOP RED MAXI  1X406MM (MISCELLANEOUS) ×2
LOOP VESSEL MAXI 1X406 RED (MISCELLANEOUS) ×1 IMPLANT
LOOP VESSEL MINI 0.8X406 BLUE (MISCELLANEOUS) ×2 IMPLANT
LOOPS BLUE MINI 0.8X406MM (MISCELLANEOUS) ×4
NEEDLE FILTER BLUNT 18X 1/2SAF (NEEDLE) ×2
NEEDLE FILTER BLUNT 18X1 1/2 (NEEDLE) ×1 IMPLANT
NS IRRIG 500ML POUR BTL (IV SOLUTION) IMPLANT
PACK EXTREMITY ARMC (MISCELLANEOUS) ×3 IMPLANT
PAD PREP 24X41 OB/GYN DISP (PERSONAL CARE ITEMS) ×3 IMPLANT
PUNCH SURGICAL ROTATE 2.7MM (MISCELLANEOUS) IMPLANT
STOCKINETTE STRL 4IN 9604848 (GAUZE/BANDAGES/DRESSINGS) ×3 IMPLANT
SUT GTX CV-6 30 (SUTURE) ×6 IMPLANT
SUT MNCRL+ 5-0 UNDYED PC-3 (SUTURE) ×1 IMPLANT
SUT MONOCRYL 5-0 (SUTURE) ×2
SUT PROLENE 6 0 BV (SUTURE) ×6 IMPLANT
SUT SILK 2 0 (SUTURE) ×2
SUT SILK 2 0 SH (SUTURE) ×3 IMPLANT
SUT SILK 2-0 18XBRD TIE 12 (SUTURE) ×1 IMPLANT
SUT SILK 3 0 (SUTURE) ×2
SUT SILK 3-0 18XBRD TIE 12 (SUTURE) ×1 IMPLANT
SUT SILK 4 0 (SUTURE) ×2
SUT SILK 4-0 18XBRD TIE 12 (SUTURE) ×1 IMPLANT
SUT VIC AB 3-0 SH 27 (SUTURE) ×2
SUT VIC AB 3-0 SH 27X BRD (SUTURE) ×1 IMPLANT
SYR 20CC LL (SYRINGE) ×3 IMPLANT
SYR 3ML LL SCALE MARK (SYRINGE) ×3 IMPLANT

## 2016-01-10 NOTE — Anesthesia Postprocedure Evaluation (Signed)
Anesthesia Post Note  Patient: Eddie DraftsMauro Myers  Procedure(s) Performed: Procedure(s) (LRB): INSERTION OF ARTERIOVENOUS (AV) GORE-TEX GRAFT ARM ( BRACH / AXILLARY GRAFT ) (Left)  Patient location during evaluation: PACU Anesthesia Type: General Level of consciousness: awake and alert Pain management: pain level controlled Vital Signs Assessment: post-procedure vital signs reviewed and stable Respiratory status: spontaneous breathing, nonlabored ventilation, respiratory function stable and patient connected to nasal cannula oxygen Cardiovascular status: blood pressure returned to baseline and stable Postop Assessment: no signs of nausea or vomiting Anesthetic complications: no    Last Vitals:  Vitals:   01/10/16 1652 01/10/16 1702  BP: 119/71 127/62  Pulse: 95 98  Resp: 12 16  Temp: 37.7 C 36.8 C    Last Pain:  Vitals:   01/10/16 1702  TempSrc: Temporal  PainSc:                  Yevette EdwardsJames G Raegen Tarpley

## 2016-01-10 NOTE — Anesthesia Procedure Notes (Signed)
Procedure Name: LMA Insertion Performed by: Wolfgang Finigan Pre-anesthesia Checklist: Patient identified, Patient being monitored, Timeout performed, Emergency Drugs available and Suction available Patient Re-evaluated:Patient Re-evaluated prior to inductionOxygen Delivery Method: Circle system utilized Preoxygenation: Pre-oxygenation with 100% oxygen Intubation Type: IV induction Ventilation: Mask ventilation without difficulty LMA: LMA inserted LMA Size: 4.0 Tube type: Oral Number of attempts: 1 Placement Confirmation: positive ETCO2 and breath sounds checked- equal and bilateral Tube secured with: Tape Dental Injury: Teeth and Oropharynx as per pre-operative assessment      

## 2016-01-10 NOTE — Discharge Instructions (Signed)
Call Dr. Marijean HeathSchnier's office on Monday 01/13/16 to make a follow up appointment in 3 weeks with Dr. Gilda CreaseSchnier or Selena BattenKim, you do not need any studies.    AMBULATORY SURGERY  DISCHARGE INSTRUCTIONS   1) The drugs that you were given will stay in your system until tomorrow so for the next 24 hours you should not:  A) Drive an automobile B) Make any legal decisions C) Drink any alcoholic beverage   2) You may resume regular meals tomorrow.  Today it is better to start with liquids and gradually work up to solid foods.  You may eat anything you prefer, but it is better to start with liquids, then soup and crackers, and gradually work up to solid foods.   3) Please notify your doctor immediately if you have any unusual bleeding, trouble breathing, redness and pain at the surgery site, drainage, fever, or pain not relieved by medication.    4) Additional Instructions:        Please contact your physician with any problems or Same Day Surgery at [phone], Monday through Friday 6 am to 4 pm, or [place] at [facility] Main number at [phone].

## 2016-01-10 NOTE — Anesthesia Preprocedure Evaluation (Addendum)
Anesthesia Evaluation  Patient identified by MRN, date of birth, ID band Patient awake    Reviewed: Allergy & Precautions, NPO status , Patient's Chart, lab work & pertinent test results, reviewed documented beta blocker date and time   Airway Mallampati: II  TM Distance: >3 FB     Dental  (+) Chipped, Missing, Poor Dentition, Dental Advisory Given   Pulmonary shortness of breath, former smoker,           Cardiovascular hypertension, Pt. on medications and Pt. on home beta blockers + angina + CAD, + Past MI and + CABG       Neuro/Psych CVA, No Residual Symptoms    GI/Hepatic   Endo/Other  diabetes, Type 2  Renal/GU ESRFRenal disease     Musculoskeletal   Abdominal   Peds  Hematology  (+) anemia ,   Anesthesia Other Findings External heart monitor. Cardiology clearance received. May take off during surgery. Walks with a cane.  Reproductive/Obstetrics                          Anesthesia Physical Anesthesia Plan  ASA: III  Anesthesia Plan: General   Post-op Pain Management:    Induction: Intravenous  Airway Management Planned: LMA  Additional Equipment:   Intra-op Plan:   Post-operative Plan:   Informed Consent: I have reviewed the patients History and Physical, chart, labs and discussed the procedure including the risks, benefits and alternatives for the proposed anesthesia with the patient or authorized representative who has indicated his/her understanding and acceptance.     Plan Discussed with: CRNA  Anesthesia Plan Comments:         Anesthesia Quick Evaluation

## 2016-01-10 NOTE — Op Note (Deleted)
[facility] History & Physical Update  The patient was interviewed and re-examined.  The patient's previous History and Physical has been reviewed and is unchanged.  There is no change in the plan of care. We plan to proceed with the scheduled procedure.  Levora DredgeGregory Schnier, MD  01/10/2016, 2:02 PM

## 2016-01-10 NOTE — H&P (Signed)
[facility] History & Physical Update  The patient was interviewed and re-examined.  The patient's previous History and Physical has been reviewed and is unchanged.  There is no change in the plan of care. We plan to proceed with the scheduled procedure.  Levora DredgeGregory Schnier, MD  01/10/2016, 4:02 PM

## 2016-01-10 NOTE — Op Note (Signed)
OPERATIVE NOTE   PROCEDURE: left brachial axillary arteriovenous graft placement  PRE-OPERATIVE DIAGNOSIS: End Stage Renal Disease  POST-OPERATIVE DIAGNOSIS: End Stage Renal Disease  SURGEON: Levora DredgeGregory Schnier  ASSISTANT(S): Ms. Raul DelKim Stegmayer  ANESTHESIA: general  ESTIMATED BLOOD LOSS: <50 cc  FINDING(S): 8 mm axillary vein  SPECIMEN(S):  none  INDICATIONS:   Eddie Myers is a 60 y.o. male who presents with end stage renal disease.  The patient is scheduled for left brachial axillary AV graft placement.  The patient is aware the risks include but are not limited to: bleeding, infection, steal syndrome, nerve damage, ischemic monomelic neuropathy, failure to mature, and need for additional procedures.  The patient is aware of the risks of the procedure and elects to proceed forward.  DESCRIPTION: After full informed written consent was obtained from the patient, the patient was brought back to the operating room and placed supine upon the operating table.  Prior to induction, the patient received IV antibiotics.   After obtaining adequate anesthesia, the patient was then prepped and draped in the standard fashion for a left arm access procedure.    A linear incision was then created along the medial border of the biceps muscle just proximal to the antecubital crease and the brachial artery which was exposed through. The brachial artery was then looped proximally and distally with Silastic Vesseloops. Side branches were controlled with 4-0 silk ties.  Attention was then turned to the exposure of the axillary vein. Linear incision was then created medial to the proximal portion of the biceps at the level of the anterior axillary crease. The axillary vein was exposed and again looped proximally and distally with Silastic vessel loops. Associated tributaries were also controlled with Silastic Vesseloops.  The Gore tunneler was then delivered onto the field and a subcutaneous path  was made from the arterial incision to the venous incision. A 4-7 tapered PTFE propatent graft by Emeline DarlingGore was then pulled through the subcutaneous tunnel. The arterial 4 mm portion was then approximated to the brachial artery. Brachial artery was controlled proximally and distally with the Silastic Vesseloops. Arteriotomy was made with an 11 blade scalpel and extended with Potts scissors and a 6-0 Prolene stay suture was placed. End graft to side brachial artery anastomosis was then fashioned with running CV 6 suture. Flushing maneuvers were performed suture line was hemostatic and the graft was then assessed for proper position and ease of future cannulation. Heparinized saline was infused into the vein and the graft was clamped with a vascular clamp. With the graft pressurized it was approximated to the axillary vein in its native bed and then marked with a surgical marker. The vein was then delivered into the surgical field and controlled with the Silastic vessel loops. Venotomy was then made with an 11 blade scalpel and extended with Potts scissors and a 6-0 Prolene suture was used as stay suture. The the graft was then sewn to the vein in an end graft to side vein fashion using running CV 6 suture.  Flushing maneuvers were performed and the artery was allowed to forward and back bleed.  Flow was then established through the AV graft  There was good  thrill in the venous outflow, and there was 1+ palpable radial pulse.  At this point, I irrigated out the surgical wounds.  There was no further active bleeding.  The subcutaneous tissue was reapproximated with a running stitch of 3-0 Vicryl.  The skin was then reapproximated with a running  subcuticular stitch of 4-0 Vicryl.  The skin was then cleaned, dried, and reinforced with Dermabond.    The patient tolerated this procedure well.   COMPLICATIONS: None  CONDITION: Velna HatchetGood   Gregory Schnier [name]  Office: [phone]   01/10/2016,  4:03 PM

## 2016-01-10 NOTE — Transfer of Care (Signed)
Immediate Anesthesia Transfer of Care Note  Patient: Eddie Myers DraftsMauro Dastrup  Procedure(s) Performed: Procedure(s): INSERTION OF ARTERIOVENOUS (AV) GORE-TEX GRAFT ARM ( BRACH / AXILLARY GRAFT ) (Left)  Patient Location: PACU  Anesthesia Type:General  Level of Consciousness: sedated  Airway & Oxygen Therapy: Patient Spontanous Breathing and Patient connected to face mask oxygen  Post-op Assessment: Report given to RN and Post -op Vital signs reviewed and stable  Post vital signs: Reviewed  Last Vitals:  Vitals:   01/10/16 1048 01/10/16 1622  BP: 111/73 (!) 152/66  Pulse: 88 90  Resp: 18 20  Temp: (!) 35.9 C 37.2 C    Last Pain:  Vitals:   01/10/16 1048  TempSrc: Tympanic         Complications: No apparent anesthesia complications

## 2016-01-10 NOTE — OR Nursing (Signed)
Patient is wearing  External heart monitor on left side of chest will notify anesthesia

## 2016-01-13 ENCOUNTER — Telehealth: Payer: Self-pay

## 2016-01-13 ENCOUNTER — Encounter: Payer: Self-pay | Admitting: Vascular Surgery

## 2016-01-13 NOTE — Telephone Encounter (Signed)
Patient son Eddie Myers([name]) would like to know what to do since the patient had surgery and the physician had to remove the monitor. The patient has worn the monitor for 1 week and was to wear for 2 weeks. Please advise what the patient should do.

## 2016-01-13 NOTE — Telephone Encounter (Signed)
Returned call to patient's son, ok per DPR. According to [employer] company, we can apply another monitor and "no charge" so we can capture the second week.  Patient scheduled to come in tomorrow at 0830 at nurse visit to reapply monitor.

## 2016-01-13 NOTE — Telephone Encounter (Signed)
If we apply another monitor, will the patient be charged twice?  If he will only be charged once, we should apply a new monitor.  Otherwise, we should go ahead and submit the monitor that he has already worn.  Thanks.

## 2016-01-14 ENCOUNTER — Other Ambulatory Visit: Payer: Self-pay | Admitting: *Deleted

## 2016-01-14 ENCOUNTER — Ambulatory Visit (INDEPENDENT_AMBULATORY_CARE_PROVIDER_SITE_OTHER): Payer: Medicare Other

## 2016-01-14 DIAGNOSIS — R002 Palpitations: Secondary | ICD-10-CM

## 2016-01-14 NOTE — Progress Notes (Unsigned)
Patient here for second placement of [employer]io monitor. Patient wore first monitor x1 week, had surgery where it had to be removed. Additional week of monitoring needed per Dr End. Zio Monitor placed: number [id number].

## 2016-01-16 ENCOUNTER — Encounter (INDEPENDENT_AMBULATORY_CARE_PROVIDER_SITE_OTHER): Payer: Self-pay

## 2016-01-17 ENCOUNTER — Ambulatory Visit
Admission: RE | Admit: 2016-01-17 | Discharge: 2016-01-17 | Disposition: A | Discharge status: Home / Self Care | Payer: Medicare Other | Source: Ambulatory Visit | Attending: Vascular Surgery | Admitting: Vascular Surgery

## 2016-01-17 ENCOUNTER — Encounter
Admission: RE | Disposition: A | Discharge status: Home / Self Care | Payer: Self-pay | Source: Ambulatory Visit | Attending: Vascular Surgery

## 2016-01-17 ENCOUNTER — Encounter: Payer: Self-pay | Admitting: *Deleted

## 2016-01-17 DIAGNOSIS — Z833 Family history of diabetes mellitus: Secondary | ICD-10-CM | POA: Diagnosis not present

## 2016-01-17 DIAGNOSIS — E1122 Type 2 diabetes mellitus with diabetic chronic kidney disease: Secondary | ICD-10-CM | POA: Insufficient documentation

## 2016-01-17 DIAGNOSIS — Z992 Dependence on renal dialysis: Secondary | ICD-10-CM | POA: Insufficient documentation

## 2016-01-17 DIAGNOSIS — I120 Hypertensive chronic kidney disease with stage 5 chronic kidney disease or end stage renal disease: Secondary | ICD-10-CM | POA: Diagnosis not present

## 2016-01-17 DIAGNOSIS — Z951 Presence of aortocoronary bypass graft: Secondary | ICD-10-CM | POA: Insufficient documentation

## 2016-01-17 DIAGNOSIS — N400 Enlarged prostate without lower urinary tract symptoms: Secondary | ICD-10-CM | POA: Diagnosis not present

## 2016-01-17 DIAGNOSIS — Z87891 Personal history of nicotine dependence: Secondary | ICD-10-CM | POA: Insufficient documentation

## 2016-01-17 DIAGNOSIS — Z809 Family history of malignant neoplasm, unspecified: Secondary | ICD-10-CM | POA: Diagnosis not present

## 2016-01-17 DIAGNOSIS — I252 Old myocardial infarction: Secondary | ICD-10-CM | POA: Insufficient documentation

## 2016-01-17 DIAGNOSIS — Z8673 Personal history of transient ischemic attack (TIA), and cerebral infarction without residual deficits: Secondary | ICD-10-CM | POA: Diagnosis not present

## 2016-01-17 DIAGNOSIS — N186 End stage renal disease: Secondary | ICD-10-CM | POA: Diagnosis not present

## 2016-01-17 DIAGNOSIS — Y838 Other surgical procedures as the cause of abnormal reaction of the patient, or of later complication, without mention of misadventure at the time of the procedure: Secondary | ICD-10-CM | POA: Insufficient documentation

## 2016-01-17 DIAGNOSIS — T82868A Thrombosis of vascular prosthetic devices, implants and grafts, initial encounter: Secondary | ICD-10-CM | POA: Insufficient documentation

## 2016-01-17 DIAGNOSIS — I2510 Atherosclerotic heart disease of native coronary artery without angina pectoris: Secondary | ICD-10-CM | POA: Diagnosis not present

## 2016-01-17 HISTORY — PX: PERIPHERAL VASCULAR CATHETERIZATION: SHX172C

## 2016-01-17 LAB — POTASSIUM (ARMC VASCULAR LAB ONLY): Potassium (ARMC vascular lab): 4.8 (ref 3.5–5.1)

## 2016-01-17 SURGERY — THROMBECTOMY
Anesthesia: Moderate Sedation

## 2016-01-17 MED ORDER — FENTANYL CITRATE (PF) 100 MCG/2ML IJ SOLN
INTRAMUSCULAR | Status: AC
  Filled 2016-01-17: qty 2

## 2016-01-17 MED ORDER — LIDOCAINE HCL (PF) 1 % IJ SOLN
INTRAMUSCULAR | Status: AC
  Filled 2016-01-17: qty 30

## 2016-01-17 MED ORDER — MIDAZOLAM HCL 2 MG/2ML IJ SOLN
INTRAMUSCULAR | Status: DC | PRN
  Administered 2016-01-17: 0.5 mg via INTRAVENOUS
  Administered 2016-01-17: 1 mg via INTRAVENOUS
  Administered 2016-01-17: 2 mg via INTRAVENOUS
  Administered 2016-01-17: 1 mg via INTRAVENOUS
  Administered 2016-01-17: 0.5 mg via INTRAVENOUS
  Administered 2016-01-17: 1 mg via INTRAVENOUS

## 2016-01-17 MED ORDER — DEXTROSE 5 % IV SOLN
1.5000 g | Freq: Once | INTRAVENOUS | Status: AC
  Administered 2016-01-17: 1.5 g via INTRAVENOUS

## 2016-01-17 MED ORDER — HEPARIN SODIUM (PORCINE) 1000 UNIT/ML IJ SOLN
INTRAMUSCULAR | Status: AC
  Filled 2016-01-17: qty 1

## 2016-01-17 MED ORDER — HEPARIN SODIUM (PORCINE) 1000 UNIT/ML IJ SOLN
INTRAMUSCULAR | Status: DC | PRN
  Administered 2016-01-17: 1000 [IU] via INTRAVENOUS
  Administered 2016-01-17: 4000 [IU] via INTRAVENOUS

## 2016-01-17 MED ORDER — SODIUM CHLORIDE FLUSH 0.9 % IV SOLN
INTRAVENOUS | Status: AC
  Filled 2016-01-17: qty 30

## 2016-01-17 MED ORDER — MIDAZOLAM HCL 2 MG/2ML IJ SOLN
INTRAMUSCULAR | Status: AC
  Filled 2016-01-17: qty 2

## 2016-01-17 MED ORDER — MIDAZOLAM HCL 5 MG/5ML IJ SOLN
INTRAMUSCULAR | Status: AC
  Filled 2016-01-17: qty 5

## 2016-01-17 MED ORDER — IOPAMIDOL (ISOVUE-300) INJECTION 61%
INTRAVENOUS | Status: DC | PRN
  Administered 2016-01-17: 85 mL via INTRA_ARTERIAL

## 2016-01-17 MED ORDER — FENTANYL CITRATE (PF) 100 MCG/2ML IJ SOLN
INTRAMUSCULAR | Status: DC | PRN
  Administered 2016-01-17: 25 ug via INTRAVENOUS
  Administered 2016-01-17: 50 ug via INTRAVENOUS
  Administered 2016-01-17: 25 ug via INTRAVENOUS
  Administered 2016-01-17: 50 ug via INTRAVENOUS
  Administered 2016-01-17: 25 ug via INTRAVENOUS
  Administered 2016-01-17: 50 ug via INTRAVENOUS
  Administered 2016-01-17: 25 ug via INTRAVENOUS

## 2016-01-17 MED ORDER — SODIUM CHLORIDE 0.9 % IV SOLN
INTRAVENOUS | Status: DC
  Administered 2016-01-17: 11:00:00 via INTRAVENOUS

## 2016-01-17 SURGICAL SUPPLY — 17 items
BALLN LUTONIX AV 8X60X75 (BALLOONS) ×6
BALLN LUTONIX DCB 4X40X130 (BALLOONS) ×3
BALLN LUTONIX DCB 4X60X130 (BALLOONS) ×3
BALLN ULTRVRSE 3X40X75C (BALLOONS) ×3
BALLOON LUTONIX AV 8X60X75 (BALLOONS) ×2 IMPLANT
BALLOON LUTONIX DCB 4X40X130 (BALLOONS) ×1 IMPLANT
BALLOON LUTONIX DCB 4X60X130 (BALLOONS) ×1 IMPLANT
BALLOON ULTRVRSE 3X40X75C (BALLOONS) ×1 IMPLANT
CATH TORCON 5FR 0.38 (CATHETERS) ×3 IMPLANT
DEVICE PRESTO INFLATION (MISCELLANEOUS) ×3 IMPLANT
DEVICE TORQUE (MISCELLANEOUS) ×3 IMPLANT
GUIDEWIRE ANGLED .035 180CM (WIRE) ×6 IMPLANT
KIT THROMB PERC PTD (MISCELLANEOUS) ×3 IMPLANT
PACK ANGIOGRAPHY (CUSTOM PROCEDURE TRAY) ×3 IMPLANT
SET INTRO CAPELLA COAXIAL (SET/KITS/TRAYS/PACK) ×3 IMPLANT
SHEATH BRITE TIP 6FRX5.5 (SHEATH) ×6 IMPLANT
WIRE MAGIC TORQUE 260C (WIRE) ×6 IMPLANT

## 2016-01-17 NOTE — Discharge Instructions (Signed)
Fistulografía, cuidados posteriores °(Fistulogram, Care After) °Siga estas instrucciones durante las próximas semanas. Estas indicaciones le proporcionan información general acerca de cómo deberá cuidarse después del procedimiento. El médico también podrá darle instrucciones más específicas. El tratamiento ha sido planificado según las prácticas médicas actuales, pero en algunos casos pueden ocurrir problemas. Comuníquese con el médico si tiene algún problema o tiene dudas después del procedimiento. °QUÉ ESPERAR DESPUÉS DEL PROCEDIMIENTO °Después del procedimiento, es normal tener: °· Una pequeña molestia en la zona donde se colocaron los catéteres. °· Un pequeño hematoma alrededor de la fístula. °· Somnolencia y fatiga. °INSTRUCCIONES PARA EL CUIDADO EN EL HOGAR °· Haga reposo en su casa, el día después del procedimiento. °· No conduzca ni opere maquinaria pesada mientras toma analgésicos. °· Tome los medicamentos solamente como se lo haya indicado el médico. °· No tome baños de inmersión, no nade ni use el jacuzzi hasta que el médico lo autorice. Puede ducharse 24 horas después del procedimiento o según las indicaciones del médico. °· Hay muchas maneras distintas de cerrar y cubrir una incisión, como puntos, pegamento para la piel y tiras adhesivas. Siga todas las indicaciones del médico respecto a lo siguiente: °¨ Cuidados de la herida. °¨ Cambiar y quitar el vendaje. °¨ Quitar el cierre de la incisión. °· Controle cuidadosamente la fístula de diálisis. °SOLICITE ATENCIÓN MÉDICA SI: °· Tiene secreción, enrojecimiento, hinchazón o dolor en el lugar de inserción del catéter. °· Tiene fiebre. °· Tiene escalofríos. °SOLICITE ATENCIÓN MÉDICA DE INMEDIATO SI: °· Se siente débil. °· Tiene problemas de equilibrio. °· Tiene dificultad para mover los brazos o las piernas. °· Tiene problemas visuales o para hablar. °· Ya no puede sentir una vibración o un zumbido cuando coloca los dedos sobre la fístula de diálisis. °· La  extremidad que se usó para el procedimiento: °¨ Se hincha. °¨ Duele. °¨ Está fría. °¨ Cambia de color, por ejemplo, se torna azulado o blanco pálido. °Esta información no tiene como fin reemplazar el consejo del médico. Asegúrese de hacerle al médico cualquier pregunta que tenga. °Document Released: 07/03/2013 Document Revised: 07/03/2013 Document Reviewed: 04/07/2013 °Elsevier Interactive Patient Education © 2017 Elsevier Inc[employer] ° °

## 2016-01-17 NOTE — Op Note (Signed)
OPERATIVE NOTE   PROCEDURE: 1. Contrast injection left brachial axillary dialysis graft 2. Mechanical thrombectomy left brachial axillary dialysis graft with Trerotola device 3. Percutaneous transluminal angioplasty to 4 mm left brachial axillary arterial anastomosis 4. Percutaneous transluminal angioplasty to 8 mm left venous outflow  PRE-OPERATIVE DIAGNOSIS: Complication of dialysis access                                                       End Stage Renal Disease  POST-OPERATIVE DIAGNOSIS: same as above   SURGEON: Eddie Myers, M.D.  ANESTHESIA: Conscious Sedation   ESTIMATED BLOOD LOSS: minimal  FINDING(S): 1. Thrombus within the graft  SPECIMEN(S):  None  CONTRAST: 85 cc  FLUOROSCOPY TIME: 9.4 minutes  INDICATIONS: Eddie Myers is a 60 y.o. male who  presents with malfunctioning thrombosed left brachial axillary AV access.  The patient is scheduled for angiography with possible intervention of the AV access.  The patient is aware the risks include but are not limited to: bleeding, infection, thrombosis of the cannulated access, and possible anaphylactic reaction to the contrast.  The patient acknowledges if the access can not be salvaged a tunneled catheter will be needed and will be placed during this procedure.  The patient is aware of the risks of the procedure and elects to proceed with the angiogram and intervention.  DESCRIPTION: After full informed written consent was obtained, the patient was brought back to the Special Procedure suite and placed supine position.  Appropriate cardiopulmonary monitors were placed.  The left arm was prepped and draped in the standard fashion.  Appropriate timeout is called. The left brachial axillary graft near the arterial anastomosis in an antegrade direction  was cannulated with a micropuncture needle. Ultrasound was utilized. Ultrasound placed in a sterile sleeve. The graft was imaged directly with the ultrasound. Image was  recorded for the permanent record. Access with a microneedle was performed under direct ultrasound visualization  The microwire was advanced and the needle was exchanged for  a microsheath.  The J-wire was then advanced and a 6 Fr sheath inserted.  Hand was then performed which demonstrated thrombus within the AV access.  The central venous structures were also imaged by hand injections.  4000 units of heparin was given and allowed to circulate as well.  A Trerotola device was then advanced beginning centrally and pulling back performing.  Several passes were made through the venous portion of the graft. Follow-up imaging now demonstrates the vast majority of the clot had been treated. Therefore a retrograde sheath was inserted. This too was a 6 [place] sheath was positioned more proximally on the arm and angled in the retrograde direction. Ultrasound was again utilized directly imaging the graft recording and image and subsequently the puncture being made directly under ultrasound visualization.  Subsequently a floppy Glidewire and a KMP catheter were negotiated into the arterial system hand injection contrast was then utilized to demonstrate patency of the artery as well as the location for the anastomosis. The Trerotola device was now advanced through the retrograde sheath was extended out into the artery the basket was opened and it was slowly pulled back into the graft and then the basket was engaged. Several passes were made on the arterial portion and pulsatility of the access was reestablished. Follow-up imaging demonstrates there was now thrombus in  the venous portion surrounding the sheath and this was treated with the Trerotola device from the antegrade direction. After several passes imaging demonstrated resolution of thrombus within the graft and forward flow however stricture of the graft was noted at the venous outflow also severe narrowing of the graft at the arterial anastomosis was noted this  likely secondary to the banding performed at the time of creation.  Glidewire and Kumpe catheter then negotiated into the brachial artery Glidewire was exchanged for a Magic torque and initially a 3 mm balloon was used to angioplasty the arterial anastomosis and subsequently a 4 mm Lutonix balloon was used and extended into the brachial artery by approximately 1 cm. 2 separate inflations with 2 separate balloons were utilized review negotiating the wire after the initial one so that both the proximal portion of the brachial artery and the distal portion of the brachial artery at the level of the anastomosis were treated. This established essentially normal inflow. Angiography was performed after each intervention by advancing the Kumpe catheter over the wire and into the brachial artery itself and then hand injection was performed.  Magic torque wire was then advanced through the antegrade sheath and an 8 x 6 Lutonix balloon was used to angioplasty the venous portion of the AV access. Multiple inflations were performed inflation times were for 2 minutes at proximally 8 atm.  Contrast was then injected in the forward direction demonstrating rapid flow.  A 4-0 Monocryl purse-string suture was sewn around both of the sheaths.  The sheaths were removed and light pressure was applied.  A sterile bandage was applied to the puncture site.    COMPLICATIONS: None  CONDITION: Eddie Myers, M.D [facility] Office: [phone]  01/17/2016 2:48 PM

## 2016-01-17 NOTE — H&P (Signed)
[facility] Admission History & Physical  MRN : [id number]  Eddie Myers is a 60 y.o. ([birth year]) male who presents with chief complaint of my dialysis access is clotted.  History of Present Illness: The patient is sent by their dialysis center for evaluation of the graft. When he presented to dialysis yesterday there was no thrill or bruit noted. The brachial axillary graft was placed approximately 2 weeks ago. He denies fever chills. He denies pain in his hand. He has not missed any dialysis because he is using a catheter at this time.  Current Facility-Administered Medications  Medication Dose Route Frequency Provider Last Rate Last Dose  . 0.9 %  sodium chloride infusion   Intravenous Continuous Renford Dills, MD 20 mL/hr at 01/17/16 1040    . cefUROXime (ZINACEF) 1.5 g in dextrose 5 % 50 mL IVPB  1.5 g Intravenous Once Renford Dills, MD        Past Medical History:  Diagnosis Date  . Anginal pain (HCC)   . Coronary artery disease   . Diabetes mellitus without complication (HCC)   . Dialysis patient (HCC)    Tues, Thurs, Sat  . Dyspnea    with exertion  . Elevated lipids   . Enlarged prostate   . ESRD (end stage renal disease) (HCC)    dialysis M-W-F  . Hypertension   . Myocardial infarction 08/2015  . Stroke [facility])    Lt side this year July    Past Surgical History:  Procedure Laterality Date  . AV FISTULA PLACEMENT Left 01/10/2016   Procedure: INSERTION OF ARTERIOVENOUS (AV) GORE-TEX GRAFT ARM ( BRACH / AXILLARY GRAFT );  Surgeon: Renford Dills, MD;  Location: [facility] ORS;  Service: Vascular;  Laterality: Left;  . CARDIAC CATHETERIZATION    . CORONARY ARTERY BYPASS GRAFT  01/2014   [facility] (LIMA -> LAD and SVG -> OM)  . INSERTION OF DIALYSIS CATHETER Right    Perma catheter    Social History Social History  Substance Use Topics  . Smoking status: Former Smoker    Packs/day: 0.50    Years: 45.00    Quit date: 01/05/2015  .  Smokeless tobacco: Never Used  . Alcohol use No    Family History Family History  Problem Relation Age of Onset  . Cancer Mother   . Diabetes Mother   . Diabetes Father   No family history of bleeding clotting disorders porphyria or autoimmune disease.  No Known Allergies   REVIEW OF SYSTEMS (Negative unless checked)  Constitutional: [] Weight loss  [] Fever  [] Chills Cardiac: [] Chest pain   [] Chest pressure   [] Palpitations   [] Shortness of breath when laying flat   [] Shortness of breath at rest   [] Shortness of breath with exertion. Vascular:  [] Pain in legs with walking   [] Pain in legs at rest   [] Pain in legs when laying flat   [] Claudication   [] Pain in feet when walking  [] Pain in feet at rest  [] Pain in feet when laying flat   [] History of DVT   [] Phlebitis   [] Swelling in legs   [] Varicose veins   [] Non-healing ulcers Pulmonary:   [] Uses home oxygen   [] Productive cough   [] Hemoptysis   [] Wheeze  [] COPD   [] Asthma Neurologic:  [] Dizziness  [] Blackouts   [] Seizures   [] History of stroke   [] History of TIA  [] Aphasia   [] Temporary blindness   [] Dysphagia   [] Weakness or numbness in arms   []   Weakness or numbness in legs Musculoskeletal:  [] Arthritis   [] Joint swelling   [] Joint pain   [] Low back pain Hematologic:  [] Easy bruising  [] Easy bleeding   [] Hypercoagulable state   [] Anemic  [] Hepatitis Gastrointestinal:  [] Blood in stool   [] Vomiting blood  [] Gastroesophageal reflux/heartburn   [] Difficulty swallowing. Genitourinary:  [x] Chronic kidney disease   [] Difficult urination  [] Frequent urination  [] Burning with urination   [] Blood in urine Skin:  [] Rashes   [] Ulcers   [] Wounds Psychological:  [] History of anxiety   []  History of major depression.  Physical Examination  Vitals:   01/17/16 1034  BP: (!) 155/78  Pulse: 87  Resp: 14  Temp: 97.8 F (36.6 C)  SpO2: 97%  Weight: 163 lb 2.3 oz (74 kg)   Body mass index is 24.81 kg/m. Gen: WD/WN, NAD Head: [place]/AT, No  temporalis wasting. Prominent temp pulse not noted. Ear/Nose/Throat: Hearing grossly intact, nares w/o erythema or drainage, oropharynx w/o Erythema/Exudate,  Eyes: Conjunctiva clear, sclera non-icteric Neck: Trachea midline.  No JVD.  Pulmonary:  Good air movement, respirations not labored, no use of accessory muscles.  Cardiac: RRR, normal S1, S2. Vascular: Left arm AV graft no thrill no bruit Vessel Right Left  Radial Palpable Palpable  Ulnar Not Palpable Not Palpable  Brachial Palpable Palpable  Carotid Palpable, without bruit Palpable, without bruit  Gastrointestinal: soft, non-tender/non-distended. No guarding/reflex.  Musculoskeletal: M/S 5/5 throughout.  Extremities without ischemic changes.  No deformity or atrophy.  Neurologic: Sensation grossly intact in extremities.  Symmetrical.  Speech is fluent. Motor exam as listed above. Psychiatric: Judgment intact, Mood & affect appropriate for pt's clinical situation. Dermatologic: No rashes or ulcers noted.  No cellulitis or open wounds. Lymph : No Cervical, Axillary, or Inguinal lymphadenopathy.     CBC Lab Results  Component Value Date   WBC 5.5 01/06/2016   HGB 11.2 (L) 01/10/2016   HCT 33.0 (L) 01/10/2016   MCV 87.2 01/06/2016   PLT 244 01/06/2016    BMET    Component Value Date/Time   NA 138 01/10/2016 1108   K 4.4 01/10/2016 1108   CL 100 (L) 01/06/2016 1254   CO2 23 01/06/2016 1254   GLUCOSE 189 (H) 01/10/2016 1108   BUN 66 (H) 01/06/2016 1254   CREATININE 8.75 (H) 01/06/2016 1254   CALCIUM 8.9 01/06/2016 1254   GFRNONAA 6 (L) 01/06/2016 1254   GFRAA 7 (L) 01/06/2016 1254   Estimated Creatinine Clearance: 8.6 mL/min (by C-G formula based on SCr of 8.75 mg/dL (H)).  COAG Lab Results  Component Value Date   INR 1.02 01/06/2016   INR 1.01 11/05/2015    Radiology No results found.  Assessment/Plan 1.  Complication dialysis device with thrombosis AV access:  Patient's left arm brachial axillary  dialysis access is thrombosed. The patient will undergo thrombectomy using interventional techniques. Potassium will be drawn to ensure that it is an appropriate level prior to performing thrombectomy. 2.  End-stage renal disease requiring hemodialysis:  Patient will continue dialysis therapy without further interruption if a successful thrombectomy is not achieved then catheter will be placed. Dialysis has already been arranged since the patient missed their previous session 3.  Hypertension:  Patient will continue medical management; nephrology is following no changes in oral medications. 4. Diabetes mellitus:  Glucose will be monitored and oral medications been held this morning once the patient has undergone the patient's procedure po intake will be reinitiated and again Accu-Cheks will be used to assess the blood glucose level  and treat as needed. The patient will be restarted on the patient's usual hypoglycemic regime     Levora DredgeGregory Schnier, MD  01/17/2016 11:54 AM

## 2016-01-20 ENCOUNTER — Encounter: Payer: Self-pay | Admitting: Vascular Surgery

## 2016-01-21 ENCOUNTER — Ambulatory Visit (INDEPENDENT_AMBULATORY_CARE_PROVIDER_SITE_OTHER): Payer: Medicare Other | Admitting: Vascular Surgery

## 2016-01-22 ENCOUNTER — Ambulatory Visit (INDEPENDENT_AMBULATORY_CARE_PROVIDER_SITE_OTHER): Payer: Medicare Other | Admitting: Vascular Surgery

## 2016-01-22 ENCOUNTER — Encounter (INDEPENDENT_AMBULATORY_CARE_PROVIDER_SITE_OTHER): Payer: Self-pay

## 2016-01-22 ENCOUNTER — Other Ambulatory Visit (INDEPENDENT_AMBULATORY_CARE_PROVIDER_SITE_OTHER): Payer: Self-pay | Admitting: Vascular Surgery

## 2016-01-22 ENCOUNTER — Encounter (INDEPENDENT_AMBULATORY_CARE_PROVIDER_SITE_OTHER): Payer: Self-pay | Admitting: Vascular Surgery

## 2016-01-22 VITALS — BP 160/66 | HR 75 | Resp 17 | Ht 68.0 in | Wt 163.0 lb

## 2016-01-22 DIAGNOSIS — N186 End stage renal disease: Secondary | ICD-10-CM

## 2016-01-22 DIAGNOSIS — Z992 Dependence on renal dialysis: Secondary | ICD-10-CM

## 2016-01-22 DIAGNOSIS — T829XXA Unspecified complication of cardiac and vascular prosthetic device, implant and graft, initial encounter: Secondary | ICD-10-CM | POA: Insufficient documentation

## 2016-01-22 DIAGNOSIS — T829XXD Unspecified complication of cardiac and vascular prosthetic device, implant and graft, subsequent encounter: Secondary | ICD-10-CM

## 2016-01-22 DIAGNOSIS — E118 Type 2 diabetes mellitus with unspecified complications: Secondary | ICD-10-CM

## 2016-01-22 NOTE — Progress Notes (Signed)
Subjective:    Patient ID: Eddie Myers, male    DOB: 1955/09/15, 60 y.o.   MRN: [id number] Chief Complaint  Patient presents with  . Routine Post Op    Follow up from AV insert   Patient presents with a chief complaint of left hand pain and numbness. Numbness is primary in his second to fourth fingers. Pain is constant and worsening. He is s/p 01/10/16: left brachial axillary arteriovenous graft placement and 01/17/16: contrast injection left brachial axillary dialysis graft, mechanical thrombectomy left brachial axillary dialysis graft with Trerotola device, percutaneous transluminal angioplasty to 4 mm left brachial axillary arterial anastomosis with percutaneous transluminal angioplasty to 8 mm left venous outflow. Denies any ulcer formation. Left fingers second to fourth tips are pale.    Review of Systems  Constitutional: Negative.   HENT: Negative.   Eyes: Negative.   Respiratory: Negative.   Cardiovascular:       Left Hand Pain and Numbness  Gastrointestinal: Negative.   Endocrine: Negative.   Genitourinary: Negative.   Musculoskeletal: Negative.   Skin: Negative.   Allergic/Immunologic: Negative.   Neurological: Negative.   Hematological: Negative.   Psychiatric/Behavioral: Negative.       Objective:   Physical Exam  Constitutional: He is oriented to person, place, and time. He appears well-developed and well-nourished.  HENT:  Head: Normocephalic and atraumatic.  Right Ear: External ear normal.  Left Ear: External ear normal.  Eyes: Conjunctivae and EOM are normal. Pupils are equal, round, and reactive to light.  Neck: Normal range of motion. Neck supple.  Cardiovascular: Normal rate, regular rhythm, normal heart sounds and intact distal pulses.   Pulses:      Radial pulses are 2+ on the right side, and 0 on the left side.       Dorsalis pedis pulses are 2+ on the right side, and 2+ on the left side.       Posterior tibial pulses are 2+ on the right side, and 2+  on the left side.  Left Access: Good bruit and thrill. Fingers cool. Tips from second to fourth are pale. No ulcer formation.   Pulmonary/Chest: Effort normal and breath sounds normal.  Abdominal: Soft. Bowel sounds are normal.  Musculoskeletal: Normal range of motion. He exhibits no edema.  Neurological: He is alert and oriented to person, place, and time.  Skin: Skin is dry. No rash noted. No erythema.  Psychiatric: He has a normal mood and affect. His behavior is normal. Judgment and thought content normal.   BP (!) 160/66 (BP Location: Right Arm)   Pulse 75   Resp 17   Ht 5\' 8"  (1.727 m)   Wt 163 lb (73.9 kg)   BMI 24.78 kg/m   Past Medical History:  Diagnosis Date  . Anginal pain (HCC)   . Coronary artery disease   . Diabetes mellitus without complication (HCC)   . Dialysis patient (HCC)    Tues, Thurs, Sat  . Dyspnea    with exertion  . Elevated lipids   . Enlarged prostate   . ESRD (end stage renal disease) (HCC)    dialysis M-W-F  . Hypertension   . Myocardial infarction 08/2015  . Stroke [facility](HCC)    Lt side this year July   Social History   Social History  . Marital status: Single    Spouse name: N/A  . Number of children: N/A  . Years of education: N/A   Occupational History  . Not on file.  Social History Main Topics  . Smoking status: Former Smoker    Packs/day: 0.50    Years: 45.00    Quit date: 01/05/2015  . Smokeless tobacco: Never Used  . Alcohol use No  . Drug use: No  . Sexual activity: Not on file   Other Topics Concern  . Not on file   Social History Narrative  . No narrative on file   Past Surgical History:  Procedure Laterality Date  . AV FISTULA PLACEMENT Left 01/10/2016   Procedure: INSERTION OF ARTERIOVENOUS (AV) GORE-TEX GRAFT ARM ( BRACH / AXILLARY GRAFT );  Surgeon: Renford Dills, MD;  Location: [facility] ORS;  Service: Vascular;  Laterality: Left;  . CARDIAC CATHETERIZATION    . CORONARY ARTERY BYPASS GRAFT  01/2014   [facility] (LIMA -> LAD and SVG -> OM)  . INSERTION OF DIALYSIS CATHETER Right    Perma catheter  . PERIPHERAL VASCULAR CATHETERIZATION N/A 01/17/2016   Procedure: Thrombectomy;  Surgeon: Renford Dills, MD;  Location: [facility] INVASIVE CV LAB;  Service: Cardiovascular;  Laterality: N/A;   Family History  Problem Relation Age of Onset  . Cancer Mother   . Diabetes Mother   . Diabetes Father    No Known Allergies     Assessment & Plan:  Patient presents with a chief complaint of left hand pain and numbness. Numbness is primary in his second to fourth fingers. Pain is constant and worsening. He is s/p 01/10/16: left brachial axillary arteriovenous graft placement and 01/17/16: contrast injection left brachial axillary dialysis graft, mechanical thrombectomy left brachial axillary dialysis graft with Trerotola device, percutaneous transluminal angioplasty to 4 mm left brachial axillary arterial anastomosis with percutaneous transluminal angioplasty to 8 mm left venous outflow. Denies any ulcer formation. Left fingers second to fourth tips are pale.   1. ESRD on dialysis [facility]) - Stable 01/10/16: left brachial axillary arteriovenous graft placement and 01/17/16: contrast injection left brachial axillary dialysis graft, mechanical thrombectomy left brachial axillary dialysis graft with Trerotola device, percutaneous transluminal angioplasty to 4 mm left brachial axillary arterial anastomosis with percutaneous transluminal angioplasty to 8 mm left venous outflow.   2. Complication from renal dialysis device, subsequent encounter - New Symptoms and exam worrisome for steal syndrome. Recommend left upper extremity fistulogram to restore blood flow to hand. If hand pain worsens, pale color worsens or ulcers develop - patient is do go to the ED immediately.   3. Type 2 diabetes mellitus with complication, unspecified long term insulin use status (HCC) - Stable Encouraged good control as its slows the  progression of atherosclerotic disease  Current Outpatient Prescriptions on File Prior to Visit  Medication Sig Dispense Refill  . aspirin EC 81 MG tablet Take 81 mg by mouth daily.    [name] atorvastatin (LIPITOR) 40 MG tablet Take 80 mg by mouth daily at 6 PM.    . carvedilol (COREG) 12.5 MG tablet Take 12.5 mg by mouth 1 day or 1 dose.     . clopidogrel (PLAVIX) 75 MG tablet Take 75 mg by mouth daily.    [name] glipiZIDE (GLUCOTROL) 5 MG tablet Take 10 mg by mouth 2 (two) times daily before a meal.    . HYDROcodone-acetaminophen (NORCO) 5-325 MG tablet Take 1-2 tablets by mouth every 6 (six) hours as needed for moderate pain. 50 tablet 0  . insulin glargine (LANTUS) 100 UNIT/ML injection Inject 25 Units into the skin at bedtime.     . insulin regular (NOVOLIN R,HUMULIN R) 100  units/mL injection Inject 10 Units into the skin 3 (three) times daily before meals. 0-10 units    . isosorbide dinitrate (ISORDIL) 30 MG tablet Take 30 mg by mouth daily.    [name]. lisinopril (PRINIVIL,ZESTRIL) 2.5 MG tablet Take 2.5 mg by mouth daily.    . megestrol (MEGACE) 20 MG tablet Take 20 mg by mouth 2 (two) times daily.    . Melatonin 3 MG TABS Take 1 tablet by mouth at bedtime as needed.    . nitroGLYCERIN (NITROSTAT) 0.4 MG SL tablet Place 0.4 mg under the tongue every 5 (five) minutes as needed for chest pain.    . sevelamer carbonate (RENVELA) 800 MG tablet Take 1,600 mg by mouth 3 (three) times daily with meals.     . tamsulosin (FLOMAX) 0.4 MG CAPS capsule Take 0.4 mg by mouth daily after breakfast.     No current facility-administered medications on file prior to visit.     There are no Patient Instructions on file for this visit. No Follow-up on file.   Sallyann Kinnaird A Janita Camberos, PA-C

## 2016-01-27 ENCOUNTER — Encounter (INDEPENDENT_AMBULATORY_CARE_PROVIDER_SITE_OTHER): Payer: Medicare Other | Admitting: Vascular Surgery

## 2016-01-27 MED ORDER — DEXTROSE 5 % IV SOLN
1.5000 g | INTRAVENOUS | Status: AC
  Administered 2016-01-28: 1.5 g via INTRAVENOUS

## 2016-01-28 ENCOUNTER — Ambulatory Visit
Admission: RE | Admit: 2016-01-28 | Discharge: 2016-01-28 | Disposition: A | Discharge status: Home / Self Care | Payer: Medicare Other | Source: Ambulatory Visit | Attending: Vascular Surgery | Admitting: Vascular Surgery

## 2016-01-28 ENCOUNTER — Encounter
Admission: RE | Disposition: A | Discharge status: Home / Self Care | Payer: Self-pay | Source: Ambulatory Visit | Attending: Vascular Surgery

## 2016-01-28 ENCOUNTER — Encounter: Payer: Self-pay | Admitting: *Deleted

## 2016-01-28 DIAGNOSIS — Y832 Surgical operation with anastomosis, bypass or graft as the cause of abnormal reaction of the patient, or of later complication, without mention of misadventure at the time of the procedure: Secondary | ICD-10-CM | POA: Insufficient documentation

## 2016-01-28 DIAGNOSIS — Z7902 Long term (current) use of antithrombotics/antiplatelets: Secondary | ICD-10-CM | POA: Insufficient documentation

## 2016-01-28 DIAGNOSIS — I2510 Atherosclerotic heart disease of native coronary artery without angina pectoris: Secondary | ICD-10-CM | POA: Insufficient documentation

## 2016-01-28 DIAGNOSIS — Z794 Long term (current) use of insulin: Secondary | ICD-10-CM | POA: Insufficient documentation

## 2016-01-28 DIAGNOSIS — Z992 Dependence on renal dialysis: Secondary | ICD-10-CM | POA: Insufficient documentation

## 2016-01-28 DIAGNOSIS — N400 Enlarged prostate without lower urinary tract symptoms: Secondary | ICD-10-CM | POA: Diagnosis not present

## 2016-01-28 DIAGNOSIS — E7800 Pure hypercholesterolemia, unspecified: Secondary | ICD-10-CM | POA: Diagnosis not present

## 2016-01-28 DIAGNOSIS — E1122 Type 2 diabetes mellitus with diabetic chronic kidney disease: Secondary | ICD-10-CM | POA: Diagnosis not present

## 2016-01-28 DIAGNOSIS — G458 Other transient cerebral ischemic attacks and related syndromes: Secondary | ICD-10-CM | POA: Diagnosis not present

## 2016-01-28 DIAGNOSIS — Z951 Presence of aortocoronary bypass graft: Secondary | ICD-10-CM | POA: Diagnosis not present

## 2016-01-28 DIAGNOSIS — T82858A Stenosis of vascular prosthetic devices, implants and grafts, initial encounter: Secondary | ICD-10-CM | POA: Diagnosis present

## 2016-01-28 DIAGNOSIS — Z809 Family history of malignant neoplasm, unspecified: Secondary | ICD-10-CM | POA: Insufficient documentation

## 2016-01-28 DIAGNOSIS — Z833 Family history of diabetes mellitus: Secondary | ICD-10-CM | POA: Insufficient documentation

## 2016-01-28 DIAGNOSIS — Z7982 Long term (current) use of aspirin: Secondary | ICD-10-CM | POA: Insufficient documentation

## 2016-01-28 DIAGNOSIS — Z8673 Personal history of transient ischemic attack (TIA), and cerebral infarction without residual deficits: Secondary | ICD-10-CM | POA: Insufficient documentation

## 2016-01-28 DIAGNOSIS — N186 End stage renal disease: Secondary | ICD-10-CM | POA: Diagnosis not present

## 2016-01-28 DIAGNOSIS — I252 Old myocardial infarction: Secondary | ICD-10-CM | POA: Diagnosis not present

## 2016-01-28 DIAGNOSIS — I120 Hypertensive chronic kidney disease with stage 5 chronic kidney disease or end stage renal disease: Secondary | ICD-10-CM | POA: Insufficient documentation

## 2016-01-28 DIAGNOSIS — Z87891 Personal history of nicotine dependence: Secondary | ICD-10-CM | POA: Insufficient documentation

## 2016-01-28 HISTORY — PX: UPPER EXTREMITY ANGIOGRAM: SHX6310

## 2016-01-28 HISTORY — PX: PERIPHERAL VASCULAR CATHETERIZATION: SHX172C

## 2016-01-28 LAB — POTASSIUM (ARMC VASCULAR LAB ONLY): POTASSIUM (ARMC VASCULAR LAB): 4.2 (ref 3.5–5.1)

## 2016-01-28 SURGERY — A/V FISTULAGRAM
Anesthesia: Moderate Sedation | Laterality: Left

## 2016-01-28 MED ORDER — HEPARIN SODIUM (PORCINE) 1000 UNIT/ML IJ SOLN
INTRAMUSCULAR | Status: AC
  Filled 2016-01-28: qty 1

## 2016-01-28 MED ORDER — HYDROMORPHONE HCL 1 MG/ML IJ SOLN: 1.0000 mg | Freq: Once | INTRAMUSCULAR | Status: DC

## 2016-01-28 MED ORDER — ACETAMINOPHEN 325 MG PO TABS: 325.0000 mg | ORAL_TABLET | ORAL | Status: DC | PRN

## 2016-01-28 MED ORDER — PANTOPRAZOLE SODIUM 40 MG PO TBEC: 40.0000 mg | DELAYED_RELEASE_TABLET | Freq: Every day | ORAL | Status: DC

## 2016-01-28 MED ORDER — HEPARIN SODIUM (PORCINE) 1000 UNIT/ML IJ SOLN
INTRAMUSCULAR | Status: DC | PRN
  Administered 2016-01-28: 4000 [IU] via INTRAVENOUS

## 2016-01-28 MED ORDER — SODIUM CHLORIDE 0.9 % IV SOLN: INTRAVENOUS | Status: DC

## 2016-01-28 MED ORDER — FENTANYL CITRATE (PF) 100 MCG/2ML IJ SOLN
INTRAMUSCULAR | Status: AC
  Filled 2016-01-28: qty 2

## 2016-01-28 MED ORDER — MIDAZOLAM HCL 5 MG/5ML IJ SOLN
INTRAMUSCULAR | Status: AC
  Filled 2016-01-28: qty 5

## 2016-01-28 MED ORDER — MORPHINE SULFATE (PF) 4 MG/ML IV SOLN: 2.0000 mg | INTRAVENOUS | Status: DC | PRN

## 2016-01-28 MED ORDER — FAMOTIDINE 20 MG PO TABS: 40.0000 mg | ORAL_TABLET | ORAL | Status: DC | PRN

## 2016-01-28 MED ORDER — FENTANYL CITRATE (PF) 100 MCG/2ML IJ SOLN
INTRAMUSCULAR | Status: DC | PRN
  Administered 2016-01-28: 50 ug via INTRAVENOUS
  Administered 2016-01-28: 100 ug via INTRAVENOUS

## 2016-01-28 MED ORDER — ONDANSETRON HCL 4 MG/2ML IJ SOLN: 4.0000 mg | Freq: Four times a day (QID) | INTRAMUSCULAR | Status: DC | PRN

## 2016-01-28 MED ORDER — METOPROLOL TARTRATE 5 MG/5ML IV SOLN: 5.0000 mg | Freq: Four times a day (QID) | INTRAVENOUS | Status: DC

## 2016-01-28 MED ORDER — LIDOCAINE HCL (PF) 1 % IJ SOLN
INTRAMUSCULAR | Status: AC
  Filled 2016-01-28: qty 30

## 2016-01-28 MED ORDER — LABETALOL HCL 5 MG/ML IV SOLN: 10.0000 mg | INTRAVENOUS | Status: DC | PRN

## 2016-01-28 MED ORDER — MIDAZOLAM HCL 2 MG/2ML IJ SOLN
INTRAMUSCULAR | Status: DC | PRN
  Administered 2016-01-28: 2 mg via INTRAVENOUS
  Administered 2016-01-28: 1 mg via INTRAVENOUS

## 2016-01-28 MED ORDER — OXYCODONE HCL 5 MG PO TABS: 5.0000 mg | ORAL_TABLET | ORAL | Status: DC | PRN

## 2016-01-28 MED ORDER — ACETAMINOPHEN 325 MG RE SUPP
325.0000 mg | RECTAL | Status: DC | PRN
  Filled 2016-01-28: qty 2

## 2016-01-28 MED ORDER — DOCUSATE SODIUM 100 MG PO CAPS: 100.0000 mg | ORAL_CAPSULE | Freq: Every day | ORAL | Status: DC

## 2016-01-28 MED ORDER — METHYLPREDNISOLONE SODIUM SUCC 125 MG IJ SOLR: 125.0000 mg | INTRAMUSCULAR | Status: DC | PRN

## 2016-01-28 MED ORDER — HYDRALAZINE HCL 20 MG/ML IJ SOLN: 5.0000 mg | INTRAMUSCULAR | Status: DC | PRN

## 2016-01-28 MED ORDER — ALUM & MAG HYDROXIDE-SIMETH 200-200-20 MG/5ML PO SUSP: 15.0000 mL | ORAL | Status: DC | PRN

## 2016-01-28 SURGICAL SUPPLY — 22 items
BALLN LUTONIX DCB 5X40X130 (BALLOONS) ×3
BALLOON LUTONIX DCB 5X40X130 (BALLOONS) ×1 IMPLANT
CATH ANGIO 5F 100CM .035 PIG (CATHETERS) ×3 IMPLANT
CATH GWIRE MARINER STRGHT 4FR (CATHETERS) ×3 IMPLANT
CATH H1 100CM (CATHETERS) ×3 IMPLANT
DEVICE PRESTO INFLATION (MISCELLANEOUS) ×3 IMPLANT
DEVICE STARCLOSE SE CLOSURE (Vascular Products) ×3 IMPLANT
DEVICE TORQUE (MISCELLANEOUS) ×3 IMPLANT
DRAPE BRACHIAL (DRAPES) ×3 IMPLANT
GLIDEWIRE ANGLED SS 035X260CM (WIRE) ×3 IMPLANT
PACK ANGIOGRAPHY (CUSTOM PROCEDURE TRAY) ×3 IMPLANT
SET INTRO CAPELLA COAXIAL (SET/KITS/TRAYS/PACK) ×3 IMPLANT
SHEATH BRITE TIP 5FRX11 (SHEATH) ×3 IMPLANT
SHEATH BRITE TIP 6FRX5.5 (SHEATH) ×3 IMPLANT
SHEATH RAABE 7FR (SHEATH) ×3 IMPLANT
SHEATH SHUTTLE 7FR (SHEATH) ×3 IMPLANT
SHIELD RADPAD SCOOP 12X17 (MISCELLANEOUS) ×3 IMPLANT
STENT LIFESTREAM 8X37X80 (Permanent Stent) ×3 IMPLANT
TOWEL OR 17X26 4PK STRL BLUE (TOWEL DISPOSABLE) ×3 IMPLANT
VALVE CHECKFLO PERFORMER (SHEATH) ×3 IMPLANT
WIRE HI TORQ VERSACORE 300 (WIRE) ×3 IMPLANT
WIRE J 3MM .035X145CM (WIRE) ×3 IMPLANT

## 2016-01-28 NOTE — Op Note (Signed)
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  Arch aortogram  2.  Left upper extremity angiography third order catheter placement with additional third order  3.  Percutaneous transluminal angioplasty left brachial artery at the level of the AV graft anastomosis  4.  Percutaneous transluminal angioplasty and stent placement origin left subclavian  5.  *Close right common femoral artery   Anesthesia: Conscious sedation was administered under my direct supervision. IV Versed plus fentanyl were utilized. Continuous ECG, pulse oximetry and blood pressure was monitored throughout the entire procedure.  Conscious sedation was administered for a total of 70 minutes.  Sheath: 7 [place] shuttle sheath 90 cm  Contrast: 80 cc   Fluoroscopy Time: 9.5 minutes  Indications:  Patient presented to the office with increasing pain in his left hand and nonpalpable radial pulse. Noninvasive studies suggested steal syndrome as well. Rest benefits for angiography were reviewed the hope for intervention for salvage of his AV graft was reviewed all questions were answered patient has agreed to proceed  Procedure:  Mohmed Farver is a 60 y.o. male who was identified and appropriate procedural time out was performed.  The patient was then placed supine on the table and prepped and draped in the usual sterile fashion.    Ultrasound was used to evaluate the right common femoral artery.  It is echolucent and pulsatile indicating it is patent .   A micropuncture needle was used to access the right common femoral artery under direct  ultrasound guidance and an image was recorded for the permanent record.  A microwire was then advanced under fluoroscopic guidance followed by the micro-sheath.  A 0.035 J wire was advanced without resistance and a 5Fr sheath was placed.    Pigtail catheter was then advanced to the level of ascending aorta and and LAO projection of the aortic arch was obtained. The pigtail catheter was exchanged for a H1 catheter. The left subclavian artery was then selected and the catheter and wire advanced. Hand injection of contrast was then used to create images of the subclavian axillary and brachial arteries was obtained.  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 The distal subclavian and axillary arteries were patent some smooth narrowing was noted in the distal subclavian but this represented less than 25% diameter reduction. Proximal brachial artery was widely patent.  Distally the brachial artery was narrowed proximally 60-70% at the level of the AV graft anastomosis. Visualized portions of the AV graft were widely patent. Initially there was very poor distal flow beyond the graft when contrast was injected at the level of the proximal brachial. Catheter was then advanced over the wire and a radial artery selection was performed hand injection demonstrated patency down to the hand filling of the arch. Catheter was then repositioned and the wire catheter were negotiated into the ulnar and again hand injection of the ulnar artery demonstrated patency with filling of the palmar arch. To be noted that although the trifurcation radial and ulnar arteries are patent there are diffusely diseased.  4000 Units of heparin was then given and the 5 French sheath was exchanged for a 90 cm shuttle  sheath.  Glidewire and catheter were then negotiated distally. And the Glidewire was exchanged for a versa core wire.  The lesion identified in the brachial artery was then treated with a 5 x 4  Lutonix balloon. Follow-up imaging demonstrated improvement in this lesion with less than 15% residual stenosis.   The sheath was then repositioned to the origin of the subclavian magnified oblique imaging was performed demonstrating the 90% stenosis. With the versa core wire extending down into the brachial artery as the working wire a Glidewire was advanced through the sheath and positioned in the vertebral artery. Vertebral was nonvisualized because there is retrograde flow secondary to the stricture and stenosis. Having marked the origin of the vertebral artery the 8 x 37 Lifestream stent was selected advanced across the lesion and deployed to 12 atm. Follow-up imaging demonstrated excellent apposition of the stent the vertebral artery origin is preserved and there is now antegrade flow. The stent extends approximately 1-2 mm into the arch.   After review of the images the catheter was removed over wire and an RAO view of the groin was obtained. StarClose device was deployed without difficulty.  Findings:   Arch aortogram:  There is a type I arch. 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  Left Upper Extremity:  The stenosis of the subclavian is as noted. The axillary and proximal two thirds of the brachial artery widely patent. At the level of the AV graft anastomosis there is a 6070% narrowing. Distally the trifurcation is preserved as the radial and ulnar although there is diffuse nonflow limiting disease noted. Following angioplasty of the brachial artery there is resolution of this lesion. Following angioplasty and stent placement there is complete resolution of the proximal lesion with a marked improvement in flow antegrade flow in the vertebral is now noted. Also there is a significant improvement in contrast flowing distal to the AV graft into the radial and ulnar.     Disposition: Patient was taken to the recovery  room in stable condition having tolerated the procedure well.  Belenda Cruise Gerad Cornelio 01/28/2016,11:26 AM

## 2016-01-28 NOTE — H&P (Signed)
[place] [facility] History & Physical Update  The patient was interviewed and re-examined.  The patient's previous History and Physical has been reviewed and is unchanged.  There is no change in the plan of care. We plan to proceed with the scheduled procedure.  Levora DredgeGregory Schnier, MD  01/28/2016, 9:33 AM

## 2016-01-30 ENCOUNTER — Encounter: Payer: Self-pay | Admitting: Vascular Surgery

## 2016-02-10 ENCOUNTER — Encounter (INDEPENDENT_AMBULATORY_CARE_PROVIDER_SITE_OTHER): Payer: Self-pay | Admitting: Vascular Surgery

## 2016-02-10 ENCOUNTER — Ambulatory Visit (INDEPENDENT_AMBULATORY_CARE_PROVIDER_SITE_OTHER): Payer: Medicare Other | Admitting: Vascular Surgery

## 2016-02-10 VITALS — BP 130/61 | HR 91 | Resp 15 | Ht 68.0 in | Wt 164.0 lb

## 2016-02-10 DIAGNOSIS — E118 Type 2 diabetes mellitus with unspecified complications: Secondary | ICD-10-CM

## 2016-02-10 DIAGNOSIS — T829XXD Unspecified complication of cardiac and vascular prosthetic device, implant and graft, subsequent encounter: Secondary | ICD-10-CM

## 2016-02-10 DIAGNOSIS — I2510 Atherosclerotic heart disease of native coronary artery without angina pectoris: Secondary | ICD-10-CM

## 2016-02-10 DIAGNOSIS — N186 End stage renal disease: Secondary | ICD-10-CM

## 2016-02-10 DIAGNOSIS — Z992 Dependence on renal dialysis: Secondary | ICD-10-CM

## 2016-02-10 NOTE — Progress Notes (Signed)
Patient ID: Eddie DraftsMauro Batte, male   DOB: 1956-01-03, 60 y.o.   MRN: [id number]  Chief Complaint  Patient presents with  . Re-evaluation    Post op RUE angio    HPI Eddie DraftsMauro Bernard is a 60 y.o. male.  The patient is s/p PTA and stent of the left subclavian artery at its origin.  He reports his hand pain is completely gone.    Past Medical History:  Diagnosis Date  . Anginal pain (HCC)   . Coronary artery disease   . Diabetes mellitus without complication (HCC)   . Dialysis patient (HCC)    Tues, Thurs, Sat  . Dyspnea    with exertion  . Elevated lipids   . Enlarged prostate   . ESRD (end stage renal disease) (HCC)    dialysis M-W-F  . Hypertension   . Myocardial infarction 08/2015  . Stroke [facility](HCC)    Lt side this year July    Past Surgical History:  Procedure Laterality Date  . AV FISTULA PLACEMENT Left 01/10/2016   Procedure: INSERTION OF ARTERIOVENOUS (AV) GORE-TEX GRAFT ARM ( BRACH / AXILLARY GRAFT );  Surgeon: Renford DillsGregory G Schnier, MD;  Location: [facility] ORS;  Service: Vascular;  Laterality: Left;  . CARDIAC CATHETERIZATION    . CORONARY ARTERY BYPASS GRAFT  01/2014   [facility] (LIMA -> LAD and SVG -> OM)  . INSERTION OF DIALYSIS CATHETER Right    Perma catheter  . PERIPHERAL VASCULAR CATHETERIZATION N/A 01/17/2016   Procedure: Thrombectomy;  Surgeon: Renford DillsGregory G Schnier, MD;  Location: [facility] INVASIVE CV LAB;  Service: Cardiovascular;  Laterality: N/A;  . PERIPHERAL VASCULAR CATHETERIZATION Left 01/28/2016   Procedure: A/V Fistulagram;  Surgeon: Renford DillsGregory G Schnier, MD;  Location: [facility] INVASIVE CV LAB;  Service: Cardiovascular;  Laterality: Left;  . UPPER EXTREMITY ANGIOGRAM Left 01/28/2016   Procedure: Upper Extremity Angiogram;  Surgeon: Renford DillsGregory G Schnier, MD;  Location: [facility] INVASIVE CV LAB;  Service: Cardiovascular;  Laterality: Left;      No Known Allergies  Current Outpatient Prescriptions  Medication Sig Dispense Refill  . aspirin EC 81 MG tablet Take 81 mg by  mouth daily.    [name]. atorvastatin (LIPITOR) 80 MG tablet     . carvedilol (COREG) 12.5 MG tablet Take 12.5 mg by mouth 1 day or 1 dose.     . clopidogrel (PLAVIX) 75 MG tablet Take 75 mg by mouth daily.    [name]. glipiZIDE (GLUCOTROL) 5 MG tablet Take 10 mg by mouth 2 (two) times daily before a meal.    . HYDROcodone-acetaminophen (NORCO) 5-325 MG tablet Take 1-2 tablets by mouth every 6 (six) hours as needed for moderate pain. 50 tablet 0  . insulin glargine (LANTUS) 100 UNIT/ML injection Inject 25 Units into the skin at bedtime.     . insulin regular (NOVOLIN R,HUMULIN R) 100 units/mL injection Inject 10 Units into the skin 3 (three) times daily before meals. 0-10 units    . isosorbide dinitrate (ISORDIL) 30 MG tablet Take 30 mg by mouth daily.    [name]. lisinopril (PRINIVIL,ZESTRIL) 2.5 MG tablet Take 2.5 mg by mouth daily.    . megestrol (MEGACE) 20 MG tablet Take 20 mg by mouth 2 (two) times daily.    . Melatonin 3 MG TABS Take 1 tablet by mouth at bedtime as needed.    . nitroGLYCERIN (NITROSTAT) 0.4 MG SL tablet Place 0.4 mg under the tongue every 5 (five) minutes as needed for chest pain.    . sevelamer  carbonate (RENVELA) 800 MG tablet Take 1,600 mg by mouth 3 (three) times daily with meals.     . tamsulosin (FLOMAX) 0.4 MG CAPS capsule Take 0.4 mg by mouth daily after breakfast.     No current facility-administered medications for this visit.         Physical Exam BP 130/61 (BP Location: Right Arm)   Pulse 91   Resp 15   Ht 5\' 8"  (1.727 m)   Wt 74.4 kg (164 lb)   BMI 24.94 kg/m  Gen:  WD/WN, NAD Skin: incision C/D/I AV Graft:  Good thrill good bruit Left Upper extremity:  2+ radial pulse present     Assessment/Plan:  He is s/p AV graft left arm with subsequent steal syndrome resolved with stenting of the origin of the left subclavian     Levora DredgeGregory Schnier 02/10/2016, 3:20 PM   This note was created with Dragon medical transcription system.  Any errors from dictation are  unintentional.

## 2016-03-05 ENCOUNTER — Encounter: Payer: Self-pay | Admitting: Vascular Surgery

## 2016-03-06 ENCOUNTER — Encounter: Payer: Self-pay | Admitting: Internal Medicine

## 2016-03-06 ENCOUNTER — Ambulatory Visit (INDEPENDENT_AMBULATORY_CARE_PROVIDER_SITE_OTHER): Payer: Medicare Other | Admitting: Internal Medicine

## 2016-03-06 VITALS — BP 150/72 | HR 86 | Ht 68.0 in | Wt 170.0 lb

## 2016-03-06 DIAGNOSIS — I480 Paroxysmal atrial fibrillation: Secondary | ICD-10-CM | POA: Diagnosis not present

## 2016-03-06 DIAGNOSIS — I2510 Atherosclerotic heart disease of native coronary artery without angina pectoris: Secondary | ICD-10-CM | POA: Diagnosis not present

## 2016-03-06 DIAGNOSIS — I255 Ischemic cardiomyopathy: Secondary | ICD-10-CM

## 2016-03-06 DIAGNOSIS — I159 Secondary hypertension, unspecified: Secondary | ICD-10-CM | POA: Diagnosis not present

## 2016-03-06 MED ORDER — WARFARIN SODIUM 2.5 MG PO TABS: 2.5000 mg | ORAL_TABLET | Freq: Every day | ORAL | 0 refills | Status: DC

## 2016-03-06 MED ORDER — CARVEDILOL 25 MG PO TABS: 25.0000 mg | ORAL_TABLET | Freq: Two times a day (BID) | ORAL | 3 refills | Status: DC

## 2016-03-06 NOTE — Patient Instructions (Signed)
Medication Instructions:  Your physician has recommended you make the following change in your medication:  1- Start Coumadin 2.5 mg (1 tablet) by mouth daily at 6 pm. 2- INCREASE Carvedilol to 25 mg by mouth twice a day. 3- STOP taking Isosorbide (Isordil).   Labwork: - None ordered.   Testing/Procedures: - None ordered.   Follow-Up: Your physician recommends that you schedule a follow-up appointment in: COUMADIN CLINIC NEXT WEEK [facility](WEDNESDAY, 03/11/16).  Your physician recommends that you schedule a follow-up appointment in: 3 MONTHS WITH DR END.   If you need a refill on your cardiac medications before your next appointment, please call your pharmacy.

## 2016-03-06 NOTE — Progress Notes (Signed)
Follow-up Outpatient Visit Date: 03/06/2016  Chief Complaint: Follow-up heart monitor  HPI:  Eddie Myers is a 61 y.o. year-old male with history of coronary artery disease status post 2 vessel CABG at [facility] in 01/2014 complicated by postop atrial fibrillation, stroke in 08/2015 with residual left-sided weakness, hypertension, diabetes mellitus, and Ronasia Isola-stage renal disease on hemodialysis, who returns for follow-up after recent event monitor demonstrated paroxysmal atrial fibrillation. History is obtained with the assistance of a Spanish interpreter. The patient notes that he had several episodes of brief palpitations while wearing the monitor. He did not experience lightheadedness, chest pain, or shortness of breath. He has been tolerating hemodialysis well with the exception of occasional transient low blood pressures. He is asymptomatic during these episodes. He would notes that his dialysis sessions have not need to be altered or cut short due to blood pressure issues. He still dialyzes through his tunneled catheter, as his left upper extremity fistula is still maturing.  The patient has not had any bleeding. He has not had any new neurologic changes to suggest interval stroke.  --------------------------------------------------------------------------------------------------  Cardiovascular History & Procedures: Cardiovascular Problems:  CAD s/p CABG (2015)  Post-operative atrial fibrillation (paroxysmal)  Stroke  Risk Factors:  Hypertension, hyperlipidemia, diabetes mellitus, male gender, age, and known coronary artery disease  Cath/PCI:  LHC (01/24/14, [facility]): Diffuse LMCA disease up to 90%.  LAD with 80% mid and 99% apical stenoses.  LCx without disease, though OM1 has diffuse disease.  RCA with moderate diffuse disease and 99% lesion in midportion of PDA.  CV Surgery:  CABG (01/2014 at [facility]): LIMA -> LAD and SVG -> OM  EP Procedures and Devices:  14-day event monitor (03/01/16):  Predominant rhythm was normal sinus with an average rate of 81 bpm. Occasional PACs and rare PVCs were noted. One episode of SVT lasting 11 beats was identified. Paroxysmal atrial fibrillation (8% atrial fibrillation burden) was noted with rates ranging from 99-182 bpm. Longest episode lasted 7 hours, 15 minutes.  Non-Invasive Evaluation(s):  Pharmacologic stress test (12/05/15): Low risk study with small in size, mild in severity mid and apical anterior defect representing scar vs artifact.  No ischemia.  LVEF >65%.  TTE (11/21/15): Normal LV size and function (EF 60-65%).  Grade 1 diastolic dysfunction.  Aortic sclerosis without stenosis.  Normal RV size and function.  Normal PA pressure.  Recent CV Pertinent Labs: Lab Results  Component Value Date   INR 1.02 01/06/2016   K 4.4 01/10/2016   BUN 66 (H) 01/06/2016   CREATININE 8.75 (H) 01/06/2016    Past medical and surgical history were reviewed and updated in EPIC.  Current Outpatient Prescriptions on File Prior to Visit  Medication Sig Dispense Refill  . aspirin EC 81 MG tablet Take 81 mg by mouth daily.    [name] atorvastatin (LIPITOR) 80 MG tablet     . clopidogrel (PLAVIX) 75 MG tablet Take 75 mg by mouth daily.    [name] glipiZIDE (GLUCOTROL) 5 MG tablet Take 10 mg by mouth 2 (two) times daily before a meal.    . HYDROcodone-acetaminophen (NORCO) 5-325 MG tablet Take 1-2 tablets by mouth every 6 (six) hours as needed for moderate pain. 50 tablet 0  . insulin glargine (LANTUS) 100 UNIT/ML injection Inject 25 Units into the skin at bedtime.     . insulin regular (NOVOLIN R,HUMULIN R) 100 units/mL injection Inject 10 Units into the skin 3 (three) times daily before meals. 0-10 units    . lisinopril (PRINIVIL,ZESTRIL) 2.5  MG tablet Take 2.5 mg by mouth daily.    . megestrol (MEGACE) 20 MG tablet Take 20 mg by mouth 2 (two) times daily.    . Melatonin 3 MG TABS Take 1 tablet by mouth at bedtime as needed.    . nitroGLYCERIN (NITROSTAT) 0.4 MG SL  tablet Place 0.4 mg under the tongue every 5 (five) minutes as needed for chest pain.    . sevelamer carbonate (RENVELA) 800 MG tablet Take 1,600 mg by mouth 3 (three) times daily with meals.     . tamsulosin (FLOMAX) 0.4 MG CAPS capsule Take 0.4 mg by mouth daily after breakfast.     No current facility-administered medications on file prior to visit.     Allergies: Patient has no known allergies.  Social History   Social History  . Marital status: Single    Spouse name: N/A  . Number of children: N/A  . Years of education: N/A   Occupational History  . Not on file.   Social History Main Topics  . Smoking status: Former Smoker    Packs/day: 0.50    Years: 45.00    Quit date: 01/05/2015  . Smokeless tobacco: Never Used  . Alcohol use No  . Drug use: No  . Sexual activity: Not on file   Other Topics Concern  . Not on file   Social History Narrative  . No narrative on file    Family History  Problem Relation Age of Onset  . Cancer Mother   . Diabetes Mother   . Diabetes Father     Review of Systems: A 12-system review of systems was performed and was negative except as noted in the HPI.  --------------------------------------------------------------------------------------------------  Physical Exam: BP (!) 150/72 (BP Location: Right Arm, Patient Position: Sitting, Cuff Size: Normal)   Pulse 86   Ht 5\' 8"  (1.727 m)   Wt 170 lb (77.1 kg)   BMI 25.85 kg/m   General:  Well-developed, well-nourished man seated comfortably in the exam room.  He is accompanied by his daughter. HEENT: No conjunctival pallor or scleral icterus.  Moist mucous membranes.  OP clear. Neck: Supple without lymphadenopathy, thyromegaly, JVD, or HJR.  No carotid bruit. Lungs: Normal work of breathing.  Clear to auscultation bilaterally without wheezes or crackles. Heart: Regular rate and rhythm without murmurs, rubs, or gallops.  Non-displaced PMI. Abd: Bowel sounds present.  Soft, NT/ND  without hepatosplenomegaly Ext: No lower extremity edema.  Radial, PT, and DP pulses are 2+ bilaterally. Skin: warm and dry without rash.  Right chest HD catheter in place.Left upper extremity hemodialysis fistula covered with a Band-Aid.  EKG: Normal sinus rhythm without significant abnormalities.  Lab Results  Component Value Date   WBC 5.5 01/06/2016   HGB 11.2 (L) 01/10/2016   HCT 33.0 (L) 01/10/2016   MCV 87.2 01/06/2016   PLT 244 01/06/2016    Lab Results  Component Value Date   NA 138 01/10/2016   K 4.4 01/10/2016   CL 100 (L) 01/06/2016   CO2 23 01/06/2016   BUN 66 (H) 01/06/2016   CREATININE 8.75 (H) 01/06/2016   GLUCOSE 189 (H) 01/10/2016   Lipid panel (09/08/15, [facility]): Total cholesterol 213, triglycerides 213, HDL 47, LDL 124  --------------------------------------------------------------------------------------------------  ASSESSMENT AND PLAN: Paroxysmal atrial fibrillation Recent monitor demonstrated paroxysmal atrial fibrillation. Given that the patient has had several strokes and also has other significant risk factors for cardioembolic events, we have agreed to initiate warfarin. He is not a good candidate  for novel oral anticoagulant therapy due to his Mechell Girgis-stage renal disease. We will begin warfarin 2.5 mg daily and have him return next week to establish care in the anticoagulation clinic in [place]. Given suboptimal rate control while in atrial fibrillation, we will increase carvedilol to 25 mg twice a day. As he was minimally symptomatic while in atrial fibrillation, we will defer initiation of an antiarrhythmic therapy.   Coronary artery disease Patient had an STEMI in the summer of 2017 in [place]. Subsequent myocardial perfusion stress test was low risk without any significant ischemia. We will continue with secondary prevention, including high intensity statin therapy. Given that we are starting warfarin, we will discontinue clopidogrel to minimize  the risk for bleeding. The patient should remain on low-dose aspirin for the time being.  Ischemic cardiomyopathy Most recent echocardiogram demonstrated normalization of LV function. We will continue his current medication regimen.  Hypertension Blood pressure mildly elevated today. As above, we will increase carvedilol to 25 mg twice a day. Will stop isosorbide dinitrate given lack of chest pain. Hopefully, this will also reduce potential for hypotension with dialysis.   Follow-up: Return to anticoagulation clinic next week (03/11/16).  Return for clinic visit in 3 months.  Yvonne Kendall, MD 03/07/2016 2:19 PM

## 2016-03-07 ENCOUNTER — Encounter: Payer: Self-pay | Admitting: Internal Medicine

## 2016-03-07 DIAGNOSIS — I480 Paroxysmal atrial fibrillation: Secondary | ICD-10-CM | POA: Insufficient documentation

## 2016-03-09 ENCOUNTER — Telehealth: Payer: Self-pay | Admitting: *Deleted

## 2016-03-09 NOTE — Telephone Encounter (Signed)
-----   Message from Yvonne Kendallhristopher End, MD sent at 03/07/2016  2:23 PM EST ----- Regarding: Medication Change Hi Eddie DikeJennifer,  I forgot to instruct Eddie Myers to stop taking clopidogrel, now that he has been started on warfarin.  Do you mind passing this along to him?  She he should remain on low-dose aspirin for the time being.  Thanks.  Thayer Ohmhris

## 2016-03-09 NOTE — Telephone Encounter (Signed)
Peter Kiewit SonsCalled Interpreter Line, spoke with interpreter number [phone].  She called patient and left message for him to call our office as soon as possible to go over medication instructions.

## 2016-03-09 NOTE — Telephone Encounter (Signed)
S/w with Brett FairyFrancisca Tercero (daughter) ok per DPR. She verbalized understanding for patient to stop taking the clopidogrel(PLAVIX) as patient is on coumadin now. She verbalized understanding and will let her father know. She verbalized understanding of patient's appt with coumadin clinic this Wednesday 03/11/16.

## 2016-03-16 ENCOUNTER — Encounter (INDEPENDENT_AMBULATORY_CARE_PROVIDER_SITE_OTHER): Payer: Self-pay

## 2016-03-17 ENCOUNTER — Telehealth: Payer: Self-pay | Admitting: Pharmacist

## 2016-03-17 NOTE — Telephone Encounter (Signed)
LMTCB as patient is new to Coumadin and started on 03/06/16. He was to be checked on Wednesday in our [place] office, but office is to be closed due to inclement weather. Will need [facility] [place] on Thursday or Friday with INR called to [address].

## 2016-03-23 ENCOUNTER — Other Ambulatory Visit (INDEPENDENT_AMBULATORY_CARE_PROVIDER_SITE_OTHER): Payer: Self-pay | Admitting: Vascular Surgery

## 2016-03-23 NOTE — Telephone Encounter (Signed)
Pt has new Coumadin pt appt in [place] [facility] scheduled for 03/25/16 at 12:20pm.

## 2016-03-24 ENCOUNTER — Ambulatory Visit
Admission: RE | Admit: 2016-03-24 | Discharge: 2016-03-24 | Disposition: A | Discharge status: Home / Self Care | Payer: Medicare Other | Source: Ambulatory Visit | Attending: Vascular Surgery | Admitting: Vascular Surgery

## 2016-03-24 ENCOUNTER — Encounter
Admission: RE | Disposition: A | Discharge status: Home / Self Care | Payer: Self-pay | Source: Ambulatory Visit | Attending: Vascular Surgery

## 2016-03-24 DIAGNOSIS — Z7982 Long term (current) use of aspirin: Secondary | ICD-10-CM | POA: Insufficient documentation

## 2016-03-24 DIAGNOSIS — Z8673 Personal history of transient ischemic attack (TIA), and cerebral infarction without residual deficits: Secondary | ICD-10-CM | POA: Insufficient documentation

## 2016-03-24 DIAGNOSIS — Z794 Long term (current) use of insulin: Secondary | ICD-10-CM | POA: Diagnosis not present

## 2016-03-24 DIAGNOSIS — I255 Ischemic cardiomyopathy: Secondary | ICD-10-CM | POA: Insufficient documentation

## 2016-03-24 DIAGNOSIS — I120 Hypertensive chronic kidney disease with stage 5 chronic kidney disease or end stage renal disease: Secondary | ICD-10-CM | POA: Diagnosis not present

## 2016-03-24 DIAGNOSIS — Z951 Presence of aortocoronary bypass graft: Secondary | ICD-10-CM | POA: Diagnosis not present

## 2016-03-24 DIAGNOSIS — Z7902 Long term (current) use of antithrombotics/antiplatelets: Secondary | ICD-10-CM | POA: Insufficient documentation

## 2016-03-24 DIAGNOSIS — I2510 Atherosclerotic heart disease of native coronary artery without angina pectoris: Secondary | ICD-10-CM | POA: Diagnosis not present

## 2016-03-24 DIAGNOSIS — E785 Hyperlipidemia, unspecified: Secondary | ICD-10-CM | POA: Insufficient documentation

## 2016-03-24 DIAGNOSIS — Z992 Dependence on renal dialysis: Secondary | ICD-10-CM | POA: Insufficient documentation

## 2016-03-24 DIAGNOSIS — E1122 Type 2 diabetes mellitus with diabetic chronic kidney disease: Secondary | ICD-10-CM | POA: Insufficient documentation

## 2016-03-24 DIAGNOSIS — I9789 Other postprocedural complications and disorders of the circulatory system, not elsewhere classified: Secondary | ICD-10-CM | POA: Diagnosis not present

## 2016-03-24 DIAGNOSIS — Z87891 Personal history of nicotine dependence: Secondary | ICD-10-CM | POA: Diagnosis not present

## 2016-03-24 DIAGNOSIS — N186 End stage renal disease: Secondary | ICD-10-CM | POA: Insufficient documentation

## 2016-03-24 DIAGNOSIS — Z452 Encounter for adjustment and management of vascular access device: Secondary | ICD-10-CM | POA: Insufficient documentation

## 2016-03-24 DIAGNOSIS — Z809 Family history of malignant neoplasm, unspecified: Secondary | ICD-10-CM | POA: Diagnosis not present

## 2016-03-24 DIAGNOSIS — Z833 Family history of diabetes mellitus: Secondary | ICD-10-CM | POA: Diagnosis not present

## 2016-03-24 HISTORY — PX: PERIPHERAL VASCULAR CATHETERIZATION: SHX172C

## 2016-03-24 SURGERY — DIALYSIS/PERMA CATHETER REMOVAL
Anesthesia: Moderate Sedation

## 2016-03-24 MED ORDER — CARVEDILOL 25 MG PO TABS: 25.0000 mg | ORAL_TABLET | Freq: Two times a day (BID) | ORAL | 3 refills | Status: DC

## 2016-03-24 SURGICAL SUPPLY — 2 items
TRAY LACERAT/PLASTIC (MISCELLANEOUS) ×3 IMPLANT
TRAY SUT REMOVAL LITTAUER SCS (KITS) ×3 IMPLANT

## 2016-03-24 NOTE — Op Note (Signed)
  OPERATIVE NOTE   PROCEDURE: 1. Removal of a right IJ tunneled dialysis catheter  PRE-OPERATIVE DIAGNOSIS: Complication of dialysis catheter  POST-OPERATIVE DIAGNOSIS: Same  SURGEON: Levora DredgeGregory Schnier  ANESTHESIA: Local anesthetic with 1% lidocaine with epinephrine   ESTIMATED BLOOD LOSS: Minimal   FINDING(S): 1. Catheter intact   SPECIMEN(S):  Catheter  INDICATIONS:   Eddie DraftsMauro Gary is a 61 y.o. male who presents with functioning arm access and a clotted tunneled catheter. He is therefore undergoing removal of his tunneled catheter as it as it is no longer needed. The risks and benefits of been reviewed the patient agrees to proceed.  DESCRIPTION: After obtaining full informed written consent, the patient was positioned supine. The right IJ catheter and surrounding area is prepped and draped in a sterile fashion. The cuff was localized by palpation and noted to be greater than 3 cm from the exit site. After appropriate timeout is called, 1% lidocaine with epinephrine is infiltrated into the surrounding tissues around the cuff. Small transverse incision is created with an 11 blade scalpel and the dissection was carried down to expose the cuff of the tunneled catheter.  The catheter is then freed from the surrounding attachments and adhesions. Once the catheter has been freed circumferentially it is transected just distal to the cuff and subsequently removed in 2 pieces. Light pressure was held at the base of the neck. A 4-0 Monocryl was used close the tunnel in the subcutaneous space. The 4-0 Monocryl Monocryl was then used to close the skin in a subcuticular stitch. Dermabond is applied.  Antibiotic ointment and a sterile dressing is applied to the exit site. Patient tolerated procedure well and there were no complications.  COMPLICATIONS: None  CONDITION: Unchanged  Levora DredgeGregory Schnier. [facility] Office: [phone]  03/24/2016,2:45 PM

## 2016-03-24 NOTE — Discharge Instructions (Signed)
Verbally told to remove dressing upper chest tomorrow, acknowledged understanding instruction

## 2016-03-24 NOTE — H&P (Signed)
[facility] History & Physical Update  The patient was interviewed and re-examined.  The patient's previous History and Physical has been reviewed and is unchanged.  There is no change in the plan of care. We plan to proceed with the scheduled procedure.  Levora DredgeGregory Anaija Wissink, MD  03/24/2016, 10:35 AM

## 2016-03-25 ENCOUNTER — Ambulatory Visit (INDEPENDENT_AMBULATORY_CARE_PROVIDER_SITE_OTHER): Payer: Medicare Other

## 2016-03-25 ENCOUNTER — Encounter: Payer: Self-pay | Admitting: Vascular Surgery

## 2016-03-25 DIAGNOSIS — Z5181 Encounter for therapeutic drug level monitoring: Secondary | ICD-10-CM

## 2016-03-25 DIAGNOSIS — Z7189 Other specified counseling: Secondary | ICD-10-CM | POA: Insufficient documentation

## 2016-03-25 DIAGNOSIS — I480 Paroxysmal atrial fibrillation: Secondary | ICD-10-CM

## 2016-03-25 DIAGNOSIS — I255 Ischemic cardiomyopathy: Secondary | ICD-10-CM

## 2016-03-25 DIAGNOSIS — I639 Cerebral infarction, unspecified: Secondary | ICD-10-CM

## 2016-03-25 LAB — POCT INR: INR: 1.1

## 2016-03-25 MED ORDER — WARFARIN SODIUM 2.5 MG PO TABS: ORAL_TABLET | ORAL | 1 refills | Status: DC

## 2016-03-25 NOTE — Patient Instructions (Signed)

## 2016-03-29 NOTE — H&P (Signed)
[facility] Admission History & Physical  MRN : [id number]  Eddie DraftsMauro Myers is a 61 y.o. ([birth year]) male who presents with chief complaint of No chief complaint on file. Eddie Myers.  History of Present Illness: I am asked to evaluate the patient by the dialysis center. The patient was sent here because they have been using his arm access. The patient is unaware of any other change.  Patient denies pain or tenderness overlying the access.  There is no pain with dialysis.  The patient denies hand pain or finger pain consistent with steal syndrome.    No current facility-administered medications for this encounter.    Current Outpatient Prescriptions  Medication Sig Dispense Refill  . amLODipine (NORVASC) 2.5 MG tablet Take 2.5 mg by mouth daily.    Eddie Myers. atorvastatin (LIPITOR) 80 MG tablet Take 80 mg by mouth every evening.     . ferric citrate (AURYXIA) 1 GM 210 MG(Fe) tablet Take 420 mg by mouth 3 (three) times daily with meals.    Eddie Myers. glipiZIDE (GLUCOTROL) 5 MG tablet Take 5 mg by mouth 2 (two) times daily.     . insulin glargine (LANTUS) 100 UNIT/ML injection Inject 25 Units into the skin at bedtime.     . insulin regular (NOVOLIN R,HUMULIN R) 100 units/mL injection Inject 0-10 Units into the skin 3 (three) times daily before meals.     Eddie Myers. lisinopril (PRINIVIL,ZESTRIL) 2.5 MG tablet Take 2.5 mg by mouth daily.    . Melatonin 3 MG TABS Take 3 mg by mouth at bedtime.     . nitroGLYCERIN (NITROSTAT) 0.4 MG SL tablet Place 0.4 mg under the tongue every 5 (five) minutes as needed for chest pain.    . tamsulosin (FLOMAX) 0.4 MG CAPS capsule Take 0.4 mg by mouth at bedtime.    . carvedilol (COREG) 25 MG tablet Take 1 tablet (25 mg total) by mouth 2 (two) times daily. Please contact the patient's cardiologist to see if this medication should be continued 180 tablet 3  . warfarin (COUMADIN) 2.5 MG tablet Take as directed by [facility] 35 tablet 1    Past Medical History:   Diagnosis Date  . Anginal pain (HCC)   . Coronary artery disease   . Diabetes mellitus without complication (HCC)   . Dialysis patient (HCC)    Tues, Thurs, Sat  . Dyspnea    with exertion  . Elevated lipids   . Enlarged prostate   . ESRD (end stage renal disease) (HCC)    dialysis M-W-F  . Hypertension   . Myocardial infarction 08/2015  . Stroke [facility](HCC)    Lt side this year July    Past Surgical History:  Procedure Laterality Date  . AV FISTULA PLACEMENT Left 01/10/2016   Procedure: INSERTION OF ARTERIOVENOUS (AV) GORE-TEX GRAFT ARM ( BRACH / AXILLARY GRAFT );  Surgeon: Renford DillsGregory G Keil Pickering, MD;  Location: [facility] ORS;  Service: Vascular;  Laterality: Left;  . CARDIAC CATHETERIZATION    . CORONARY ARTERY BYPASS GRAFT  01/2014   [facility] (LIMA -> LAD and SVG -> OM)  . INSERTION OF DIALYSIS CATHETER Right    Perma catheter  . PERIPHERAL VASCULAR CATHETERIZATION N/A 01/17/2016   Procedure: Thrombectomy;  Surgeon: Renford DillsGregory G Yacob Wilkerson, MD;  Location: [facility] INVASIVE CV LAB;  Service: Cardiovascular;  Laterality: N/A;  . PERIPHERAL VASCULAR CATHETERIZATION Left 01/28/2016   Procedure: A/V Fistulagram;  Surgeon: Renford DillsGregory G Dillen Belmontes, MD;  Location: [facility] INVASIVE CV LAB;  Service: Cardiovascular;  Laterality:  Left;  . PERIPHERAL VASCULAR CATHETERIZATION N/A 03/24/2016   Procedure: Dialysis/Perma Catheter Removal;  Surgeon: Renford Dills, MD;  Location: [facility] INVASIVE CV LAB;  Service: Cardiovascular;  Laterality: N/A;  . UPPER EXTREMITY ANGIOGRAM Left 01/28/2016   Procedure: Upper Extremity Angiogram;  Surgeon: Renford Dills, MD;  Location: [facility] INVASIVE CV LAB;  Service: Cardiovascular;  Laterality: Left;    Social History Social History  Substance Use Topics  . Smoking status: Former Smoker    Packs/day: 0.50    Years: 45.00    Quit date: 01/05/2015  . Smokeless tobacco: Never Used  . Alcohol use No    Family History Family History  Problem Relation Age of Onset  . Cancer  Mother   . Diabetes Mother   . Diabetes Father     No family history of bleeding or clotting disorders, autoimmune disease or porphyria  No Known Allergies   REVIEW OF SYSTEMS (Negative unless checked)  Constitutional: [] Weight loss  [] Fever  [] Chills Cardiac: [] Chest pain   [] Chest pressure   [] Palpitations   [] Shortness of breath when laying flat   [] Shortness of breath at rest   [x] Shortness of breath with exertion. Vascular:  [] Pain in legs with walking   [] Pain in legs at rest   [] Pain in legs when laying flat   [] Claudication   [] Pain in feet when walking  [] Pain in feet at rest  [] Pain in feet when laying flat   [] History of DVT   [] Phlebitis   [] Swelling in legs   [] Varicose veins   [] Non-healing ulcers Pulmonary:   [] Uses home oxygen   [] Productive cough   [] Hemoptysis   [] Wheeze  [] COPD   [] Asthma Neurologic:  [] Dizziness  [] Blackouts   [] Seizures   [] History of stroke   [] History of TIA  [] Aphasia   [] Temporary blindness   [] Dysphagia   [] Weakness or numbness in arms   [] Weakness or numbness in legs Musculoskeletal:  [] Arthritis   [] Joint swelling   [] Joint pain   [] Low back pain Hematologic:  [] Easy bruising  [] Easy bleeding   [] Hypercoagulable state   [] Anemic  [] Hepatitis Gastrointestinal:  [] Blood in stool   [] Vomiting blood  [] Gastroesophageal reflux/heartburn   [] Difficulty swallowing. Genitourinary:  [x] Chronic kidney disease   [] Difficult urination  [] Frequent urination  [] Burning with urination   [] Blood in urine Skin:  [] Rashes   [] Ulcers   [] Wounds Psychological:  [] History of anxiety   []  History of major depression.  Physical Examination  Vitals:   03/24/16 0839 03/24/16 1035  BP: (!) 185/75 (!) 175/75  Pulse: 79 70  Resp: 16 17  Temp: 98 F (36.7 C)   TempSrc: Oral   SpO2: 99% 100%   There is no height or weight on file to calculate BMI. Gen: WD/WN, NAD Head: [place]/AT, No temporalis wasting. Prominent temp pulse not noted. Ear/Nose/Throat: Hearing grossly  intact, nares w/o erythema or drainage, oropharynx w/o Erythema/Exudate,  Eyes: Conjunctiva clear, sclera non-icteric Neck: Trachea midline.  No JVD.  Pulmonary:  Good air movement, respirations not labored, no use of accessory muscles.  Cardiac: RRR, normal S1, S2. Vascular:  AV graft with good thrill and good bruit.  Tunneled catheter is clotted Vessel Right Left  Radial Palpable Palpable  Ulnar Not Palpable Not Palpable  Brachial Palpable Palpable  Carotid Palpable, without bruit Palpable, without bruit  Gastrointestinal: soft, non-tender/non-distended. No guarding/reflex.  Musculoskeletal: M/S 5/5 throughout.  Extremities without ischemic changes.  No deformity or atrophy.  Neurologic: Sensation grossly intact in extremities.  Symmetrical.  Speech is fluent. Motor exam as listed above. Psychiatric: Judgment intact, Mood & affect appropriate for pt's clinical situation. Dermatologic: No rashes or ulcers noted.  No cellulitis or open wounds. Lymph : No Cervical, Axillary, or Inguinal lymphadenopathy.   CBC Lab Results  Component Value Date   WBC 5.5 01/06/2016   HGB 11.2 (L) 01/10/2016   HCT 33.0 (L) 01/10/2016   MCV 87.2 01/06/2016   PLT 244 01/06/2016    BMET    Component Value Date/Time   NA 138 01/10/2016 1108   K 4.4 01/10/2016 1108   CL 100 (L) 01/06/2016 1254   CO2 23 01/06/2016 1254   GLUCOSE 189 (H) 01/10/2016 1108   BUN 66 (H) 01/06/2016 1254   CREATININE 8.75 (H) 01/06/2016 1254   CALCIUM 8.9 01/06/2016 1254   GFRNONAA 6 (L) 01/06/2016 1254   GFRAA 7 (L) 01/06/2016 1254   CrCl cannot be calculated (Patient's most recent lab result is older than the maximum 21 days allowed.).  COAG Lab Results  Component Value Date   INR 1.1 03/25/2016   INR 1.02 01/06/2016   INR 1.01 11/05/2015    Radiology No results found.  Assessment/Plan 1.  Complication dialysis device with thrombosis AV access:  Patient's arm access is functioning well. The patient will  undergo catheter removal.  The risks and benefits were described to the patient.  All questions were answered.  The patient agrees to proceed with catheter removal. 2.  End-stage renal disease requiring hemodialysis:  Patient will continue dialysis therapy without further interruption. Dialysis has already been arranged. 3.  Hypertension:  Patient will continue medical management; nephrology is following no changes in oral medications. 4. Diabetes mellitus:  Glucose will be monitored and oral medications been held this morning once the patient has undergone the patient's procedure po intake will be reinitiated and again Accu-Cheks will be used to assess the blood glucose level and treat as needed. The patient will be restarted on the patient's usual hypoglycemic regime 5.  Coronary artery disease:  EKG will be monitored. Nitrates will be used if needed. The patient's oral cardiac medications will be continued.    Levora Dredge, MD  03/29/2016 4:18 PM

## 2016-04-01 ENCOUNTER — Ambulatory Visit (INDEPENDENT_AMBULATORY_CARE_PROVIDER_SITE_OTHER): Payer: Medicare Other

## 2016-04-01 DIAGNOSIS — I480 Paroxysmal atrial fibrillation: Secondary | ICD-10-CM | POA: Diagnosis not present

## 2016-04-01 DIAGNOSIS — Z5181 Encounter for therapeutic drug level monitoring: Secondary | ICD-10-CM

## 2016-04-01 DIAGNOSIS — I639 Cerebral infarction, unspecified: Secondary | ICD-10-CM

## 2016-04-01 LAB — POCT INR: INR: 1.1

## 2016-04-08 ENCOUNTER — Ambulatory Visit: Payer: Medicaid Other | Admitting: Internal Medicine

## 2016-04-08 ENCOUNTER — Ambulatory Visit (INDEPENDENT_AMBULATORY_CARE_PROVIDER_SITE_OTHER): Payer: Medicare Other

## 2016-04-08 DIAGNOSIS — Z5181 Encounter for therapeutic drug level monitoring: Secondary | ICD-10-CM

## 2016-04-08 DIAGNOSIS — I480 Paroxysmal atrial fibrillation: Secondary | ICD-10-CM

## 2016-04-08 DIAGNOSIS — I639 Cerebral infarction, unspecified: Secondary | ICD-10-CM

## 2016-04-08 DIAGNOSIS — I255 Ischemic cardiomyopathy: Secondary | ICD-10-CM

## 2016-04-08 LAB — POCT INR: INR: 2

## 2016-04-08 MED ORDER — WARFARIN SODIUM 5 MG PO TABS
ORAL_TABLET | ORAL | 3 refills | Status: DC
Start: 2016-04-08 — End: 2016-07-08

## 2016-04-14 ENCOUNTER — Ambulatory Visit (INDEPENDENT_AMBULATORY_CARE_PROVIDER_SITE_OTHER): Payer: Medicare Other | Admitting: *Deleted

## 2016-04-14 DIAGNOSIS — Z5181 Encounter for therapeutic drug level monitoring: Secondary | ICD-10-CM

## 2016-04-14 DIAGNOSIS — I255 Ischemic cardiomyopathy: Secondary | ICD-10-CM

## 2016-04-14 DIAGNOSIS — I639 Cerebral infarction, unspecified: Secondary | ICD-10-CM

## 2016-04-14 DIAGNOSIS — I480 Paroxysmal atrial fibrillation: Secondary | ICD-10-CM

## 2016-04-14 LAB — POCT INR: INR: 2.9

## 2016-04-14 NOTE — Progress Notes (Signed)
S/w with Margaretmary DysMegan Supple, RPH in coumadin clinic.  She advised to continue same dosage of coumadin 5 mg daily and return for recheck on 04/22/16.

## 2016-04-22 ENCOUNTER — Ambulatory Visit (INDEPENDENT_AMBULATORY_CARE_PROVIDER_SITE_OTHER): Payer: Medicare Other

## 2016-04-22 DIAGNOSIS — Z5181 Encounter for therapeutic drug level monitoring: Secondary | ICD-10-CM | POA: Diagnosis not present

## 2016-04-22 DIAGNOSIS — I480 Paroxysmal atrial fibrillation: Secondary | ICD-10-CM | POA: Diagnosis not present

## 2016-04-22 DIAGNOSIS — I255 Ischemic cardiomyopathy: Secondary | ICD-10-CM

## 2016-04-22 DIAGNOSIS — I639 Cerebral infarction, unspecified: Secondary | ICD-10-CM

## 2016-04-22 LAB — POCT INR: INR: 2.8

## 2016-05-06 ENCOUNTER — Ambulatory Visit (INDEPENDENT_AMBULATORY_CARE_PROVIDER_SITE_OTHER): Payer: Medicare Other

## 2016-05-06 DIAGNOSIS — I480 Paroxysmal atrial fibrillation: Secondary | ICD-10-CM

## 2016-05-06 DIAGNOSIS — I639 Cerebral infarction, unspecified: Secondary | ICD-10-CM

## 2016-05-06 DIAGNOSIS — Z5181 Encounter for therapeutic drug level monitoring: Secondary | ICD-10-CM

## 2016-05-06 LAB — POCT INR: INR: 2.4

## 2016-05-27 ENCOUNTER — Telehealth: Payer: Self-pay

## 2016-05-27 ENCOUNTER — Ambulatory Visit (INDEPENDENT_AMBULATORY_CARE_PROVIDER_SITE_OTHER): Payer: Medicare Other

## 2016-05-27 DIAGNOSIS — Z5181 Encounter for therapeutic drug level monitoring: Secondary | ICD-10-CM | POA: Diagnosis not present

## 2016-05-27 DIAGNOSIS — I480 Paroxysmal atrial fibrillation: Secondary | ICD-10-CM | POA: Diagnosis not present

## 2016-05-27 DIAGNOSIS — I639 Cerebral infarction, unspecified: Secondary | ICD-10-CM | POA: Diagnosis not present

## 2016-05-27 LAB — POCT INR: INR: 2.9

## 2016-05-27 NOTE — Telephone Encounter (Signed)
Attempted to call the dental clinic at [facility] [phone].  Pt seen in office today and states he is pending dental cleaning and needs to also have some teeth extracted, unsure how many.   Pt speaks Spanish, interpreter at [facility] but difficult to clarify details of upcoming dental appts.  Pt does not have a scheduled appt for actual extractions yet, but does have an appt scheduled for cleaning.  Pt states he was told by the clinic he would need to hold his Coumadin 2 days prior to this appt and then 2 days after.  Pt has a hx of CVA and afib, advised pt we would need to clarify with DDS what target INR is or if he does in fact need to hold Coumadin will need to notify Dr End and have him cleared to hold his Coumadin from a cardiac stand point.  I left a detailed message stating the above on the dental clinic voicemail and to call [facility] to [facility] to discuss further.  Will await call back.

## 2016-05-28 NOTE — Telephone Encounter (Signed)
Spoke with Eddie OverlieMirna Myers, on 06/03/16 pt is scheduled for a deep cleaning of his teeth and gums.  Pt needs INR less than 3.0.  He does not need to hold his Coumadin prior to this appt.  Pt has a form that needs to be completed by cardiology office, they will fax to Coumadin Clinic to be completed and returned.  Advised pt's last INR on 05/27/16 was 2.9.  Attempted to contact daughter and notify her pt does not need to hold Coumadin prior to dental appt on 06/03/16. LMOM TCB.

## 2016-06-10 ENCOUNTER — Ambulatory Visit: Payer: Medicaid Other | Admitting: Internal Medicine

## 2016-06-17 ENCOUNTER — Ambulatory Visit: Payer: Medicare Other | Admitting: Internal Medicine

## 2016-06-17 ENCOUNTER — Telehealth: Payer: Self-pay | Admitting: *Deleted

## 2016-06-17 NOTE — Telephone Encounter (Signed)
Received incoming call from Myrna at [facility] dentist office. Patient is there now for deep cleaning and extractions. The clearance form was received here at our office this morning and waiting for Dr End to review.  Form given to Dr End to review at this time where he recommedned to continue warfarin, avoid epinephrine if possible and no antibiotic prophylaxsis needed.   Form faxed to Myrna at [phone].

## 2016-06-17 NOTE — Telephone Encounter (Signed)
Called Myrna back and let her know I faxed the clearance form. She asked when his last INR and it was 2.9 on 05/27/16. Patient due for next INR check scheduled for 06/24/16. She verbalized understanding and was appreciative.

## 2016-06-24 ENCOUNTER — Ambulatory Visit (INDEPENDENT_AMBULATORY_CARE_PROVIDER_SITE_OTHER): Payer: Medicare Other

## 2016-06-24 ENCOUNTER — Ambulatory Visit (INDEPENDENT_AMBULATORY_CARE_PROVIDER_SITE_OTHER): Payer: Medicare Other | Admitting: Internal Medicine

## 2016-06-24 ENCOUNTER — Inpatient Hospital Stay
Admission: AD | Admit: 2016-06-24 | Discharge: 2016-07-08 | DRG: 252 | Disposition: A | Discharge status: Skilled Nursing Facility | Payer: Medicare Other | Source: Ambulatory Visit | Attending: Internal Medicine | Admitting: Internal Medicine

## 2016-06-24 ENCOUNTER — Encounter: Payer: Self-pay | Admitting: Internal Medicine

## 2016-06-24 ENCOUNTER — Inpatient Hospital Stay: Payer: Medicare Other

## 2016-06-24 VITALS — BP 132/60 | HR 74 | Ht 68.0 in | Wt 170.0 lb

## 2016-06-24 DIAGNOSIS — I480 Paroxysmal atrial fibrillation: Secondary | ICD-10-CM | POA: Diagnosis present

## 2016-06-24 DIAGNOSIS — Z794 Long term (current) use of insulin: Secondary | ICD-10-CM

## 2016-06-24 DIAGNOSIS — Z7901 Long term (current) use of anticoagulants: Secondary | ICD-10-CM

## 2016-06-24 DIAGNOSIS — E11649 Type 2 diabetes mellitus with hypoglycemia without coma: Secondary | ICD-10-CM | POA: Diagnosis present

## 2016-06-24 DIAGNOSIS — L03031 Cellulitis of right toe: Secondary | ICD-10-CM | POA: Diagnosis present

## 2016-06-24 DIAGNOSIS — E1152 Type 2 diabetes mellitus with diabetic peripheral angiopathy with gangrene: Secondary | ICD-10-CM | POA: Diagnosis present

## 2016-06-24 DIAGNOSIS — N2581 Secondary hyperparathyroidism of renal origin: Secondary | ICD-10-CM | POA: Diagnosis present

## 2016-06-24 DIAGNOSIS — E1122 Type 2 diabetes mellitus with diabetic chronic kidney disease: Secondary | ICD-10-CM | POA: Diagnosis present

## 2016-06-24 DIAGNOSIS — R1031 Right lower quadrant pain: Secondary | ICD-10-CM

## 2016-06-24 DIAGNOSIS — N186 End stage renal disease: Secondary | ICD-10-CM | POA: Diagnosis present

## 2016-06-24 DIAGNOSIS — I959 Hypotension, unspecified: Secondary | ICD-10-CM | POA: Diagnosis not present

## 2016-06-24 DIAGNOSIS — D631 Anemia in chronic kidney disease: Secondary | ICD-10-CM | POA: Diagnosis present

## 2016-06-24 DIAGNOSIS — I132 Hypertensive heart and chronic kidney disease with heart failure and with stage 5 chronic kidney disease, or end stage renal disease: Secondary | ICD-10-CM | POA: Diagnosis present

## 2016-06-24 DIAGNOSIS — I471 Supraventricular tachycardia: Secondary | ICD-10-CM | POA: Diagnosis present

## 2016-06-24 DIAGNOSIS — Z23 Encounter for immunization: Secondary | ICD-10-CM

## 2016-06-24 DIAGNOSIS — D649 Anemia, unspecified: Secondary | ICD-10-CM | POA: Diagnosis not present

## 2016-06-24 DIAGNOSIS — I252 Old myocardial infarction: Secondary | ICD-10-CM

## 2016-06-24 DIAGNOSIS — R070 Pain in throat: Secondary | ICD-10-CM | POA: Diagnosis not present

## 2016-06-24 DIAGNOSIS — H5461 Unqualified visual loss, right eye, normal vision left eye: Secondary | ICD-10-CM | POA: Diagnosis present

## 2016-06-24 DIAGNOSIS — I255 Ischemic cardiomyopathy: Secondary | ICD-10-CM | POA: Diagnosis not present

## 2016-06-24 DIAGNOSIS — K921 Melena: Secondary | ICD-10-CM | POA: Diagnosis not present

## 2016-06-24 DIAGNOSIS — Z5181 Encounter for therapeutic drug level monitoring: Secondary | ICD-10-CM | POA: Diagnosis not present

## 2016-06-24 DIAGNOSIS — I2510 Atherosclerotic heart disease of native coronary artery without angina pectoris: Secondary | ICD-10-CM

## 2016-06-24 DIAGNOSIS — H3411 Central retinal artery occlusion, right eye: Secondary | ICD-10-CM | POA: Diagnosis present

## 2016-06-24 DIAGNOSIS — N400 Enlarged prostate without lower urinary tract symptoms: Secondary | ICD-10-CM | POA: Diagnosis present

## 2016-06-24 DIAGNOSIS — D62 Acute posthemorrhagic anemia: Secondary | ICD-10-CM | POA: Diagnosis not present

## 2016-06-24 DIAGNOSIS — I70263 Atherosclerosis of native arteries of extremities with gangrene, bilateral legs: Secondary | ICD-10-CM | POA: Diagnosis present

## 2016-06-24 DIAGNOSIS — Z833 Family history of diabetes mellitus: Secondary | ICD-10-CM

## 2016-06-24 DIAGNOSIS — I700 Atherosclerosis of aorta: Secondary | ICD-10-CM | POA: Diagnosis present

## 2016-06-24 DIAGNOSIS — I96 Gangrene, not elsewhere classified: Secondary | ICD-10-CM | POA: Diagnosis present

## 2016-06-24 DIAGNOSIS — I639 Cerebral infarction, unspecified: Secondary | ICD-10-CM | POA: Diagnosis not present

## 2016-06-24 DIAGNOSIS — I10 Essential (primary) hypertension: Secondary | ICD-10-CM

## 2016-06-24 DIAGNOSIS — Z79899 Other long term (current) drug therapy: Secondary | ICD-10-CM

## 2016-06-24 DIAGNOSIS — I70261 Atherosclerosis of native arteries of extremities with gangrene, right leg: Secondary | ICD-10-CM | POA: Diagnosis not present

## 2016-06-24 DIAGNOSIS — Z87891 Personal history of nicotine dependence: Secondary | ICD-10-CM

## 2016-06-24 DIAGNOSIS — I5022 Chronic systolic (congestive) heart failure: Secondary | ICD-10-CM | POA: Diagnosis present

## 2016-06-24 DIAGNOSIS — E875 Hyperkalemia: Secondary | ICD-10-CM | POA: Diagnosis not present

## 2016-06-24 DIAGNOSIS — K5900 Constipation, unspecified: Secondary | ICD-10-CM | POA: Diagnosis not present

## 2016-06-24 DIAGNOSIS — Z951 Presence of aortocoronary bypass graft: Secondary | ICD-10-CM | POA: Diagnosis not present

## 2016-06-24 DIAGNOSIS — Z992 Dependence on renal dialysis: Secondary | ICD-10-CM

## 2016-06-24 DIAGNOSIS — I70262 Atherosclerosis of native arteries of extremities with gangrene, left leg: Secondary | ICD-10-CM | POA: Diagnosis not present

## 2016-06-24 DIAGNOSIS — E785 Hyperlipidemia, unspecified: Secondary | ICD-10-CM | POA: Diagnosis present

## 2016-06-24 DIAGNOSIS — M79605 Pain in left leg: Secondary | ICD-10-CM | POA: Diagnosis not present

## 2016-06-24 DIAGNOSIS — Z8673 Personal history of transient ischemic attack (TIA), and cerebral infarction without residual deficits: Secondary | ICD-10-CM

## 2016-06-24 DIAGNOSIS — I25118 Atherosclerotic heart disease of native coronary artery with other forms of angina pectoris: Secondary | ICD-10-CM | POA: Diagnosis not present

## 2016-06-24 DIAGNOSIS — I493 Ventricular premature depolarization: Secondary | ICD-10-CM | POA: Diagnosis not present

## 2016-06-24 DIAGNOSIS — M79604 Pain in right leg: Secondary | ICD-10-CM | POA: Diagnosis not present

## 2016-06-24 DIAGNOSIS — E1165 Type 2 diabetes mellitus with hyperglycemia: Secondary | ICD-10-CM | POA: Diagnosis not present

## 2016-06-24 LAB — COMPREHENSIVE METABOLIC PANEL
ALBUMIN: 3.2 g/dL — AB (ref 3.5–5.0)
ALT: 25 U/L (ref 17–63)
AST: 15 U/L (ref 15–41)
Alkaline Phosphatase: 75 U/L (ref 38–126)
Anion gap: 10 (ref 5–15)
BUN: 26 mg/dL — AB (ref 6–20)
CALCIUM: 8.3 mg/dL — AB (ref 8.9–10.3)
CO2: 28 mmol/L (ref 22–32)
Chloride: 94 mmol/L — ABNORMAL LOW (ref 101–111)
Creatinine, Ser: 6.66 mg/dL — ABNORMAL HIGH (ref 0.61–1.24)
GFR calc Af Amer: 9 mL/min — ABNORMAL LOW (ref >60)
GFR, EST NON AFRICAN AMERICAN: 8 mL/min — AB (ref >60)
Glucose, Bld: 61 mg/dL — ABNORMAL LOW (ref 65–99)
POTASSIUM: 4 mmol/L (ref 3.5–5.1)
Sodium: 132 mmol/L — ABNORMAL LOW (ref 135–145)
Total Bilirubin: 0.6 mg/dL (ref 0.3–1.2)
Total Protein: 8 g/dL (ref 6.5–8.1)

## 2016-06-24 LAB — GLUCOSE, CAPILLARY
GLUCOSE-CAPILLARY: 153 mg/dL — AB (ref 65–99)
GLUCOSE-CAPILLARY: 125 mg/dL — AB (ref 65–99)
Glucose-Capillary: 50 mg/dL — ABNORMAL LOW (ref 65–99)

## 2016-06-24 LAB — CBC
HCT: 29 % — ABNORMAL LOW (ref 40.0–52.0)
Hemoglobin: 9.9 g/dL — ABNORMAL LOW (ref 13.0–18.0)
MCH: 29.2 pg (ref 26.0–34.0)
MCHC: 34 g/dL (ref 32.0–36.0)
MCV: 85.8 fL (ref 80.0–100.0)
Platelets: 324 10*3/uL (ref 150–440)
RBC: 3.38 MIL/uL — ABNORMAL LOW (ref 4.40–5.90)
RDW: 14.5 % (ref 11.5–14.5)
WBC: 10.4 10*3/uL (ref 3.8–10.6)

## 2016-06-24 LAB — POCT INR: INR: 4.9

## 2016-06-24 LAB — PROTIME-INR
INR: 3.53
PROTHROMBIN TIME: 36.2 s — AB (ref 11.4–15.2)

## 2016-06-24 MED ORDER — INSULIN GLARGINE 100 UNIT/ML ~~LOC~~ SOLN
25.0000 [IU] | Freq: Every day | SUBCUTANEOUS | Status: DC
  Administered 2016-06-24: 25 [IU] via SUBCUTANEOUS
  Filled 2016-06-24 (×2): qty 0.25

## 2016-06-24 MED ORDER — INSULIN ASPART 100 UNIT/ML ~~LOC~~ SOLN
0.0000 [IU] | Freq: Every day | SUBCUTANEOUS | Status: DC
  Administered 2016-07-02 – 2016-07-06 (×2): 2 [IU] via SUBCUTANEOUS
  Filled 2016-06-24 (×2): qty 2
  Filled 2016-06-24: qty 1

## 2016-06-24 MED ORDER — INSULIN ASPART 100 UNIT/ML ~~LOC~~ SOLN
0.0000 [IU] | Freq: Three times a day (TID) | SUBCUTANEOUS | Status: DC
  Administered 2016-06-25: 1 [IU] via SUBCUTANEOUS
  Administered 2016-06-27 – 2016-06-29 (×5): 2 [IU] via SUBCUTANEOUS
  Administered 2016-06-30 – 2016-07-02 (×3): 1 [IU] via SUBCUTANEOUS
  Administered 2016-07-02 – 2016-07-06 (×8): 2 [IU] via SUBCUTANEOUS
  Administered 2016-07-06: 3 [IU] via SUBCUTANEOUS
  Administered 2016-07-06 – 2016-07-07 (×2): 2 [IU] via SUBCUTANEOUS
  Administered 2016-07-07: 1 [IU] via SUBCUTANEOUS
  Administered 2016-07-07 – 2016-07-08 (×3): 2 [IU] via SUBCUTANEOUS
  Filled 2016-06-24 (×8): qty 2
  Filled 2016-06-24: qty 1
  Filled 2016-06-24: qty 2
  Filled 2016-06-24: qty 1
  Filled 2016-06-24 (×5): qty 2
  Filled 2016-06-24: qty 5
  Filled 2016-06-24: qty 2
  Filled 2016-06-24: qty 1
  Filled 2016-06-24 (×4): qty 2
  Filled 2016-06-24: qty 1
  Filled 2016-06-24: qty 2

## 2016-06-24 MED ORDER — LISINOPRIL 5 MG PO TABS
2.5000 mg | ORAL_TABLET | Freq: Every day | ORAL | Status: DC
  Administered 2016-06-26 – 2016-07-08 (×7): 2.5 mg via ORAL
  Filled 2016-06-24: qty 1
  Filled 2016-06-24: qty 0.5
  Filled 2016-06-24 (×8): qty 1

## 2016-06-24 MED ORDER — MORPHINE SULFATE (PF) 2 MG/ML IV SOLN
2.0000 mg | INTRAVENOUS | Status: DC | PRN
  Administered 2016-06-24 – 2016-06-30 (×8): 2 mg via INTRAVENOUS
  Filled 2016-06-24 (×8): qty 1

## 2016-06-24 MED ORDER — WARFARIN SODIUM 5 MG PO TABS: 5.0000 mg | ORAL_TABLET | Freq: Every day | ORAL | Status: DC

## 2016-06-24 MED ORDER — AMLODIPINE BESYLATE 5 MG PO TABS
2.5000 mg | ORAL_TABLET | Freq: Every day | ORAL | Status: DC
  Administered 2016-06-26 – 2016-06-29 (×3): 2.5 mg via ORAL
  Filled 2016-06-24 (×6): qty 1

## 2016-06-24 MED ORDER — ACETAMINOPHEN 650 MG RE SUPP
650.0000 mg | Freq: Four times a day (QID) | RECTAL | Status: DC | PRN
Start: 2016-06-24 — End: 2016-07-08

## 2016-06-24 MED ORDER — MELATONIN 5 MG PO TABS
5.0000 mg | ORAL_TABLET | Freq: Every day | ORAL | Status: DC
  Administered 2016-06-24 – 2016-07-07 (×14): 5 mg via ORAL
  Filled 2016-06-24 (×15): qty 1

## 2016-06-24 MED ORDER — FERRIC CITRATE 1 GM 210 MG(FE) PO TABS
420.0000 mg | ORAL_TABLET | Freq: Three times a day (TID) | ORAL | Status: DC
  Administered 2016-06-26: 420 mg via ORAL
  Filled 2016-06-24 (×2): qty 2

## 2016-06-24 MED ORDER — ONDANSETRON HCL 4 MG PO TABS: 4.0000 mg | ORAL_TABLET | Freq: Four times a day (QID) | ORAL | Status: DC | PRN

## 2016-06-24 MED ORDER — TAMSULOSIN HCL 0.4 MG PO CAPS
0.4000 mg | ORAL_CAPSULE | Freq: Every day | ORAL | Status: DC
  Administered 2016-06-24 – 2016-07-07 (×14): 0.4 mg via ORAL
  Filled 2016-06-24 (×14): qty 1

## 2016-06-24 MED ORDER — NITROGLYCERIN 0.4 MG SL SUBL
0.4000 mg | SUBLINGUAL_TABLET | SUBLINGUAL | Status: DC | PRN
Start: 2016-06-24 — End: 2016-07-08

## 2016-06-24 MED ORDER — INSULIN ASPART 100 UNIT/ML ~~LOC~~ SOLN: 0.0000 [IU] | Freq: Three times a day (TID) | SUBCUTANEOUS | Status: DC

## 2016-06-24 MED ORDER — ATORVASTATIN CALCIUM 20 MG PO TABS
80.0000 mg | ORAL_TABLET | Freq: Every evening | ORAL | Status: DC
  Administered 2016-06-25 – 2016-07-07 (×12): 80 mg via ORAL
  Filled 2016-06-24 (×12): qty 4

## 2016-06-24 MED ORDER — OXYCODONE HCL 5 MG PO TABS
10.0000 mg | ORAL_TABLET | ORAL | Status: DC | PRN
  Administered 2016-06-24 – 2016-07-05 (×28): 10 mg via ORAL
  Filled 2016-06-24 (×29): qty 2
  Filled 2016-06-24: qty 1
  Filled 2016-06-24: qty 2

## 2016-06-24 MED ORDER — CARVEDILOL 25 MG PO TABS
25.0000 mg | ORAL_TABLET | Freq: Two times a day (BID) | ORAL | Status: DC
  Administered 2016-06-25 – 2016-06-29 (×7): 25 mg via ORAL
  Filled 2016-06-24 (×9): qty 1

## 2016-06-24 MED ORDER — ONDANSETRON HCL 4 MG/2ML IJ SOLN
4.0000 mg | Freq: Four times a day (QID) | INTRAMUSCULAR | Status: DC | PRN
  Filled 2016-06-24: qty 2

## 2016-06-24 MED ORDER — PNEUMOCOCCAL VAC POLYVALENT 25 MCG/0.5ML IJ INJ
0.5000 mL | INJECTION | INTRAMUSCULAR | Status: AC
  Administered 2016-07-08: 0.5 mL via INTRAMUSCULAR
  Filled 2016-06-24: qty 0.5

## 2016-06-24 MED ORDER — GLIPIZIDE 5 MG PO TABS
5.0000 mg | ORAL_TABLET | Freq: Two times a day (BID) | ORAL | Status: DC
  Filled 2016-06-24 (×2): qty 1

## 2016-06-24 MED ORDER — ACETAMINOPHEN 325 MG PO TABS
650.0000 mg | ORAL_TABLET | Freq: Four times a day (QID) | ORAL | Status: DC | PRN
  Administered 2016-06-27 – 2016-06-28 (×2): 650 mg via ORAL
  Filled 2016-06-24 (×2): qty 2

## 2016-06-24 NOTE — Consult Note (Signed)
[facility] Vascular Consult Note  MRN : [id number]  Eddie Myers is a 61 y.o. (22-Sep-1955) male who presents with chief complaint of bilateral foot pain.  History of Present Illness: I am asked to evaluate the patient for gangrene bilateral feet by Dr. Lester Hodges.  The patient is a 61 year old gentleman well known to my service with a history of end-stage renal disease on hemodialysis. 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  He presented to the emergency room earlier today with increasing pain bilateral lower extremities. He states it began approximately 3 weeks ago. He notes the left leg is worse than the right area and he describes it as a stabbing severe 10 out of 10 pain that is continuous.  Current Facility-Administered Medications  Medication Dose Route Frequency Provider Last Rate Last Dose  . acetaminophen (TYLENOL) tablet 650 mg  650 mg Oral Q6H PRN Houston Siren, MD       Or  . acetaminophen (TYLENOL) suppository 650 mg  650 mg Rectal Q6H PRN Houston Siren, MD      . Melene Muller ON 06/25/2016] amLODipine (NORVASC) tablet 2.5 mg  2.5 mg Oral Daily Houston Siren, MD      . atorvastatin (LIPITOR) tablet 80 mg  80 mg Oral QPM Houston Siren, MD   Stopped at 06/24/16 1741  . carvedilol (COREG) tablet 25 mg  25 mg Oral BID WC Houston Siren, MD   Stopped at 06/24/16 1741  . ferric citrate (AURYXIA) tablet 420 mg  420 mg Oral TID WC Houston Siren, MD   Stopped at 06/24/16 1741  . glipiZIDE (GLUCOTROL) tablet 5 mg  5 mg Oral BID WC Houston Siren, MD   Stopped at 06/24/16 1741  . insulin aspart (novoLOG) injection 0-5 Units  0-5 Units Subcutaneous QHS Houston Siren, MD      . insulin aspart (novoLOG) injection 0-9 Units  0-9 Units Subcutaneous TID WC Houston Siren, MD      . insulin glargine (LANTUS) injection 25 Units  25 Units Subcutaneous QHS Houston Siren, MD      . Melene Muller ON 06/25/2016]  lisinopril (PRINIVIL,ZESTRIL) tablet 2.5 mg  2.5 mg Oral Daily Houston Siren, MD      . Melatonin TABS 5 mg  5 mg Oral QHS Houston Siren, MD      . morphine 2 MG/ML injection 2 mg  2 mg Intravenous Q4H PRN Houston Siren, MD   2 mg at 06/24/16 1636  . nitroGLYCERIN (NITROSTAT) SL tablet 0.4 mg  0.4 mg Sublingual Q5 min PRN Houston Siren, MD      . ondansetron (ZOFRAN) tablet 4 mg  4 mg Oral Q6H PRN Houston Siren, MD       Or  . ondansetron (ZOFRAN) injection 4 mg  4 mg Intravenous Q6H PRN Houston Siren, MD      . oxyCODONE (Oxy IR/ROXICODONE) immediate release tablet 10 mg  10 mg Oral Q4H PRN Renford Dills, MD      . Melene Muller ON 06/25/2016] pneumococcal 23 valent vaccine (PNU-IMMUNE) injection 0.5 mL  0.5 mL Intramuscular Tomorrow-1000 Houston Siren, MD      . tamsulosin (FLOMAX) capsule 0.4 mg  0.4 mg Oral QHS Houston Siren, MD        Past Medical History:  Diagnosis Date  . Anginal pain (HCC)   . Coronary artery disease   .  Diabetes mellitus without complication (HCC)   . Dialysis patient (HCC)    Tues, Thurs, Sat  . Dyspnea    with exertion  . Elevated lipids   . Enlarged prostate   . ESRD (end stage renal disease) (HCC)    dialysis M-W-F  . Hypertension   . Myocardial infarction (HCC) 08/2015  . Stroke [facility])    Lt side this year July    Past Surgical History:  Procedure Laterality Date  . AV FISTULA PLACEMENT Left 01/10/2016   Procedure: INSERTION OF ARTERIOVENOUS (AV) GORE-TEX GRAFT ARM ( BRACH / AXILLARY GRAFT );  Surgeon: Renford Dills, MD;  Location: [facility] ORS;  Service: Vascular;  Laterality: Left;  . CARDIAC CATHETERIZATION    . CORONARY ARTERY BYPASS GRAFT  01/2014   [facility] (LIMA -> LAD and SVG -> OM)  . INSERTION OF DIALYSIS CATHETER Right    Perma catheter  . PERIPHERAL VASCULAR CATHETERIZATION N/A 01/17/2016   Procedure: Thrombectomy;  Surgeon: Renford Dills, MD;  Location: [facility] INVASIVE CV LAB;  Service: Cardiovascular;  Laterality:  N/A;  . PERIPHERAL VASCULAR CATHETERIZATION Left 01/28/2016   Procedure: A/V Fistulagram;  Surgeon: Renford Dills, MD;  Location: [facility] INVASIVE CV LAB;  Service: Cardiovascular;  Laterality: Left;  . PERIPHERAL VASCULAR CATHETERIZATION N/A 03/24/2016   Procedure: Dialysis/Perma Catheter Removal;  Surgeon: Renford Dills, MD;  Location: [facility] INVASIVE CV LAB;  Service: Cardiovascular;  Laterality: N/A;  . UPPER EXTREMITY ANGIOGRAM Left 01/28/2016   Procedure: Upper Extremity Angiogram;  Surgeon: Renford Dills, MD;  Location: [facility] INVASIVE CV LAB;  Service: Cardiovascular;  Laterality: Left;    Social History Social History  Substance Use Topics  . Smoking status: Former Smoker    Packs/day: 0.50    Years: 45.00    Quit date: 01/05/2015  . Smokeless tobacco: Never Used  . Alcohol use No    Family History Family History  Problem Relation Age of Onset  . Cancer Mother   . Diabetes Mother   . Diabetes Father   No family history of bleeding/clotting disorders, porphyria or autoimmune disease   No Known Allergies   REVIEW OF SYSTEMS (Negative unless checked)  Constitutional: Weight loss  Fever  Chills Cardiac: Chest pain   Chest pressure   Palpitations   Shortness of breath when laying flat   Shortness of breath at rest   Shortness of breath with exertion. Vascular:  Pain in legs with walking   Pain in legs at rest   Pain in legs when laying flat   Claudication   Pain in feet when walking  Pain in feet at rest  Pain in feet when laying flat   History of DVT   Phlebitis   Swelling in legs   Varicose veins   Non-healing ulcers Pulmonary:   Uses home oxygen   Productive cough   Hemoptysis   Wheeze  COPD   Asthma Neurologic:  Dizziness  Blackouts   Seizures   History of stroke   History of TIA  Aphasia   Temporary blindness   Dysphagia   Weakness or numbness in arms   Weakness or numbness in  legs Musculoskeletal:  Arthritis   Joint swelling   Joint pain   Low back pain Hematologic:  Easy bruising  Easy bleeding   Hypercoagulable state   Anemic  Hepatitis Gastrointestinal:  Blood in stool   Vomiting blood  Gastroesophageal reflux/heartburn   Difficulty swallowing. Genitourinary:  Chronic kidney disease   Difficult urination    Frequent urination  Burning with urination   Blood in urine Skin:  Rashes   Ulcers   Wounds Psychological:  History of anxiety    History of major depression.  Physical Examination  Vitals:   06/24/16 1413 06/24/16 1414  BP: (!) 167/67   Pulse: 70   Resp: 16   Temp: 97.8 F (36.6 C)   TempSrc: Oral   SpO2: 98%   Weight:  75.8 kg (167 lb)  Height:   (1.727 m)   Body mass index is 25.39 kg/m. Gen:  WD/WN, NAD Head: [place]/AT, No temporalis wasting. Prominent temp pulse not noted. Ear/Nose/Throat: Hearing grossly intact, nares w/o erythema or drainage, oropharynx w/o Erythema/Exudate Eyes: Sclera non-icteric, conjunctiva clear Neck: Trachea midline.  No JVD.  Pulmonary:  Good air movement, respirations not labored, equal bilaterally.  Cardiac: RRR, normal S1, S2. Vascular: Left arm brachial axillary AV graft with mild to moderate ecchymoses good thrill good bruit.  There is gangrene of the left first second and third toes as well as the right second toe no overt cellulitis Vessel Right Left  Radial Palpable Palpable  Ulnar Palpable Palpable  Brachial Palpable Palpable  Carotid Palpable, without bruit Palpable, without bruit  Aorta Not palpable N/A  Femoral Palpable Palpable  Popliteal Not Palpable Not Palpable  PT Not Palpable Not Palpable  DP Not Palpable Not Palpable   Gastrointestinal: soft, non-tender/non-distended. No guarding/reflex.  Musculoskeletal: M/S 5/5 throughout.  Extremities without ischemic changes.  No deformity or atrophy. No edema. Neurologic: Sensation grossly intact in  extremities.  Symmetrical.  Speech is fluent. Motor exam as listed above. Psychiatric: Judgment intact, Mood & affect appropriate for pt's clinical situation. Dermatologic: No rashes or ulcers noted.  No cellulitis or open wounds. Lymph : No Cervical, Axillary, or Inguinal lymphadenopathy.      CBC Lab Results  Component Value Date   WBC 10.4 06/24/2016   HGB 9.9 (L) 06/24/2016   HCT 29.0 (L) 06/24/2016   MCV 85.8 06/24/2016   PLT 324 06/24/2016    BMET    Component Value Date/Time   NA 132 (L) 06/24/2016 1524   K 4.0 06/24/2016 1524   CL 94 (L) 06/24/2016 1524   CO2 28 06/24/2016 1524   GLUCOSE 61 (L) 06/24/2016 1524   BUN 26 (H) 06/24/2016 1524   CREATININE 6.66 (H) 06/24/2016 1524   CALCIUM 8.3 (L) 06/24/2016 1524   GFRNONAA 8 (L) 06/24/2016 1524   GFRAA 9 (L) 06/24/2016 1524   Estimated Creatinine Clearance: 11.1 mL/min (A) (by C-G formula based on SCr of 6.66 mg/dL (H)).  COAG Lab Results  Component Value Date   INR 3.53 06/24/2016   INR 4.9 06/24/2016   INR 2.9 05/27/2016    Radiology Ct Head Wo Contrast  Result Date: 06/24/2016 CLINICAL DATA:  Episode of visual loss in RIGHT eye 3-4 weeks, still with some visual deficits, history of hypertension, diabetes EXAM: CT HEAD WITHOUT CONTRAST TECHNIQUE: Contiguous axial images were obtained from the base of the skull through the vertex without intravenous contrast. COMPARISON:  None. FINDINGS: Brain: Generalized atrophy. Normal ventricular morphology. No midline shift or mass effect. Small vessel chronic ischemic changes of deep cerebral white matter. Small lacunar infarct in RIGHT thalamus, age-indeterminate. No intracranial hemorrhage, mass lesion or evidence of cortical infarction. No extra-axial fluid collections. Vascular: Atherosclerotic calcifications throughout the carotid siphons. Skull: Intact Sinuses/Orbits: Clear Other: N/A IMPRESSION: Atrophy with small vessel chronic ischemic changes of deep cerebral white  matter. Age-indeterminate lacunar infarct in  the RIGHT thalamus. No evidence of cortical infarction or intracranial hemorrhage. Electronically Signed   By: Ulyses Southward M.D.   On: 06/24/2016 15:47      Assessment/Plan 1. Atherosclerotic occlusive disease bilateral lower extremities: Patient is at high risk for limb loss and is likely to require several toe amputations. At the present time his INR is supratherapeutic. Once this as been corrected I will plan on angiography beginning with the left lower extremity as this is more severely affected. The risks and benefits were reviewed all questions answered patient agrees to proceed.   A total of 70 minutes was spent with this patient and greater than 50% was spent in counseling and coordination of care with the patient.  Discussion included the treatment options for vascular disease including indications for surgery and intervention.  Also discussed is the appropriate timing of treatment.  In addition medical therapy was discussed.  2. End-stage renal disease on hemodialysis: Currently the patient is on dialysis and will receive dialysis therapy while in-house. At the present time his access is working well. No interventions are required.   3. Diabetes mellitus patient will be placed on sliding scale.  4. Coronary artery disease: Patient will be monitored with EKG blood pressure and pulse oximetry. He will receive conscious sedation throughout the procedure. His cardiac medications will be continued. Nitrates will be used when necessary   Levora Dredge, MD  06/24/2016 8:09 PM    This note was created with Dragon medical transcription system.  Any error is purely unintentional

## 2016-06-24 NOTE — Care Management (Signed)
RNCM consult placed for medication needs.  Confirmed with [facility] that patient is active.  PCP Deloris Ping.  Last seen 04/22/16.  Patient obtains his prescriptions at [facility]  Confirmed that patient does have active Medicare part D on file and he does pick up his medication.  Medication out of pocket cost range from $1.25-$3.70.  Reported that patient is a chronic HD patient.  [facility] HD liaison notified of admission.

## 2016-06-24 NOTE — Progress Notes (Signed)
Follow-up Outpatient Visit Date: 06/24/2016  Chief Complaint: Follow-up coronary artery disease and paroxysmal atrial fibrillation  HPI:  Eddie Myers is a 61 y.o. male with history of coronary artery disease status post 2 vessel CABG at [facility] in 01/2014, paroxysmal atrial fibrillation, stroke in 08/2015 with residual left-sided weakness, hypertension, diabetes mellitus, and Sivan Cuello-stage renal disease on hemodialysis, who returns for follow-up. I last saw him in 1/18, at which time he was doing well. He was started on warfarin due to paroxysmal atrial fibrillation with elevated CHADSVASC score.  Today, Eddie Myers has several concerns. Firstly, he complains of pain and discoloration involving both feet. He initially noted some vague discomfort over the last 3-4 months. However, over the last few weeks, he has noticed marked discoloration. He was initially given ointment for treatment of a possible fungal infection. However, due to worsening of the discoloration and pain, he presented to the [facility] ED 4 days ago. He was evaluated by a surgical resident and discharged from the ED with plan for outpatient follow-up on 07/01/16. Pedal pulses were reportedly dopplerable; the patient was discharged before formal peripheral vascular studies could be obtained. However, since that time, Eddie Myers reports that the discoloration and pain have continued to worsen. He has been using acetaminophen with incomplete pain relief. He denies fevers and chills.  The patient also endorses chronic exertional dyspnea that is unchanged. However, over the last 1-2 weeks, he has also noticed a "soreness" in his throat when he walks and is breathing heavily. He denies chest pain as well as palpitations and lightheadedness. He has been compliant with his hemodialysis regimen, though it is notable that his weight has increased 7 pounds since January. He denies orthopnea and edema.  Approximately 3-4 weeks ago, Eddie Myers also experienced sudden  loss of vision in the right eye. He feels as though there is a shade that has been pulled over the right eye. He is still able to see some objects in the lateral field of view with the right eye. He does not have any significant visual deficits with the left eye. He was evaluated by his ophthalmologist shortly before the symptoms began, but has not seen anyone since then. He has chronic numbness in both feet but denies other focal neurologic changes.  The patient remains compliant with his medications, including warfarin. Of note, however, his INR was supratherapeutic in [place] on 4/21 and remains supratherapeutic today.  --------------------------------------------------------------------------------------------------  Cardiovascular History & Procedures: Cardiovascular Problems:  CAD s/p CABG (2015)  Post-operative atrial fibrillation (paroxysmal)  Stroke  Peripheral vascular disease  Risk Factors:  Hypertension, hyperlipidemia, diabetes mellitus, male gender, age, and known coronary artery disease  Cath/PCI:  LHC (01/24/14, [facility]): Diffuse LMCA disease up to 90%.  LAD with 80% mid and 99% apical stenoses.  LCx without disease, though OM1 has diffuse disease.  RCA with moderate diffuse disease and 99% lesion in midportion of PDA.  CV Surgery:  CABG (01/2014 at [facility]): LIMA -> LAD and SVG -> OM  EP Procedures and Devices:  14-day event monitor (03/01/16): Predominant rhythm was normal sinus with an average rate of 81 bpm. Occasional PACs and rare PVCs were noted. One episode of SVT lasting 11 beats was identified. Paroxysmal atrial fibrillation (8% atrial fibrillation burden) was noted with rates ranging from 99-182 bpm. Longest episode lasted 7 hours, 15 minutes.  Non-Invasive Evaluation(s):  Bilateral carotid Doppler (12/09/15, [facility]): Less than 40% internal carotid artery stenosis bilaterally. Patent vertebral arteries bilaterally with antegrade flow.  Turbulent flow noted in the  left subclavian artery.  Pharmacologic stress test (12/05/15): Low risk study with small in size, mild in severity mid and apical anterior defect representing scar vs artifact.  No ischemia.  LVEF >65%.  TTE (11/21/15): Normal LV size and function (EF 60-65%).  Grade 1 diastolic dysfunction.  Aortic sclerosis without stenosis.  Normal RV size and function.  Normal PA pressure.  Recent CV Pertinent Labs: Lab Results  Component Value Date   INR 4.9 06/24/2016   INR 1.02 01/06/2016   K 4.4 01/10/2016   BUN 66 (H) 01/06/2016   CREATININE 8.75 (H) 01/06/2016    Past medical and surgical history were reviewed and updated in EPIC.  Current Outpatient Prescriptions on File Prior to Visit  Medication Sig Dispense Refill  . amLODipine (NORVASC) 2.5 MG tablet Take 2.5 mg by mouth daily.    [name] atorvastatin (LIPITOR) 80 MG tablet Take 80 mg by mouth every evening.     . carvedilol (COREG) 25 MG tablet Take 1 tablet (25 mg total) by mouth 2 (two) times daily. Please contact the patient's cardiologist to see if this medication should be continued 180 tablet 3  . ferric citrate (AURYXIA) 1 GM 210 MG(Fe) tablet Take 420 mg by mouth 3 (three) times daily with meals.    [name] glipiZIDE (GLUCOTROL) 5 MG tablet Take 5 mg by mouth 2 (two) times daily.     . insulin glargine (LANTUS) 100 UNIT/ML injection Inject 25 Units into the skin at bedtime.     . insulin regular (NOVOLIN R,HUMULIN R) 100 units/mL injection Inject 0-10 Units into the skin 3 (three) times daily before meals.     [name] lisinopril (PRINIVIL,ZESTRIL) 2.5 MG tablet Take 2.5 mg by mouth daily.    . Melatonin 3 MG TABS Take 3 mg by mouth at bedtime.     . nitroGLYCERIN (NITROSTAT) 0.4 MG SL tablet Place 0.4 mg under the tongue every 5 (five) minutes as needed for chest pain.    . tamsulosin (FLOMAX) 0.4 MG CAPS capsule Take 0.4 mg by mouth at bedtime.    [name] warfarin (COUMADIN) 5 MG tablet Take as directed by [facility] 35 tablet 3   No current  facility-administered medications on file prior to visit.     Allergies: Patient has no known allergies.  Social History   Social History  . Marital status: Single    Spouse name: N/A  . Number of children: N/A  . Years of education: N/A   Occupational History  . Not on file.   Social History Main Topics  . Smoking status: Former Smoker    Packs/day: 0.50    Years: 45.00    Quit date: 01/05/2015  . Smokeless tobacco: Never Used  . Alcohol use No  . Drug use: No  . Sexual activity: Not on file   Other Topics Concern  . Not on file   Social History Narrative  . No narrative on file    Family History  Problem Relation Age of Onset  . Cancer Mother   . Diabetes Mother   . Diabetes Father     Review of Systems: A 12-system review of systems was performed and was negative except as noted in the HPI.  --------------------------------------------------------------------------------------------------  Physical Exam: BP 132/60 (BP Location: Right Arm, Patient Position: Sitting, Cuff Size: Normal)   Pulse 74   Ht  (1.727 m)   Wt 170 lb (77.1 kg)   BMI 25.85 kg/m  General:  Well-developed, well-nourished man seated comfortably in the exam room.  He is accompanied by his daughterAnd a [place] interpreter. HEENT: No conjunctival pallor or scleral icterus.  Moist mucous membranes.  OP clear. Pupils are equal, round, and reactive to light and accommodation. Extraocular eye movements are intact. Neck: Supple without lymphadenopathy, thyromegaly, JVD, or HJR. Lungs: Normal work of breathing.  Clear to auscultation bilaterally without wheezes or crackles. Heart: Regular rate and rhythm without murmurs, rubs, or gallops.  Non-displaced PMI. Abd: Bowel sounds present.  Soft, NT/ND without hepatosplenomegaly Ext: No lower extremity edema.  Radial pulses are 2+ bilaterally. Dorsalis pedis pulses are trace bilaterally. Posterior tibial pulses are nonpalpable.  Skin: The left  great toe is purplish/black with small amount of yellowish discharge. There is erythema extending along the medial portion of the foot to just below the ankle. There is also purplish/black discoloration of the left second toe, extending proximally to the base of the great toe.  EKG: Normal sinus rhythm without significant abnormalities. No significant changes from prior tracing.  Lab Results  Component Value Date   WBC 5.5 01/06/2016   HGB 11.2 (L) 01/10/2016   HCT 33.0 (L) 01/10/2016   MCV 87.2 01/06/2016   PLT 244 01/06/2016    Lab Results  Component Value Date   NA 138 01/10/2016   K 4.4 01/10/2016   CL 100 (L) 01/06/2016   CO2 23 01/06/2016   BUN 66 (H) 01/06/2016   CREATININE 8.75 (H) 01/06/2016   GLUCOSE 189 (H) 01/10/2016   Lipid panel (09/08/15, [facility]): Total cholesterol 213, triglycerides 213, HDL 47, LDL 124  --------------------------------------------------------------------------------------------------  ASSESSMENT AND PLAN: Gangrene I am concerned about the circulation in both feet, worse on the left. The patient was evaluated in the [facility] ED 4 days ago and has scheduled vascular surgery follow-up next week. However, he reports progression of the pain and discoloration in his feet with evidence of gangrene. Given these findings, we have agreed to direct admission to the hospitalist service with inpatient evaluation by vascular surgery.  Right eye vision loss Etiology remains unclear. Exam today is notable for a medial visual field deficit in the right eye only. Concerns include stroke or primary ophthalmologic process such as retinal pathology. It would be reasonable to obtain a CT scan while the patient is hospitalized to exclude stroke or intracranial hemorrhage, particularly in light of supratherapeutic INR. Ophthalmology evaluation as an inpatient or shortly after discharge is recommended.  Paroxysmal atrial fibrillation Eddie Myers is in sinus rhythm today and  has not had any symptoms to suggest recurrent atrial fibrillation. His INR is supratherapeutic today. Coumadin to be adjusted by the anticoagulation clinic.  Coronary artery disease Patient has stable exertional dyspnea and unusual discomfort in his throat with activity. He has not had any chest pain. Recent myocardial perfusion stress test was low risk with small in size, mild severity, mid anterior and apical anterior defect representing scar versus artifact. LV contraction was hyperdynamic. We will continue with secondary prevention.  Ischemic cardiomyopathy Eddie Myers appears euvolemic but has gained 7-10 pounds since 12/2016. His chronic exertional dyspnea is likely multifactorial but may reflect an element of volume overload with diastolic heart failure. We will defer volume management to nephrology, given that Mr. Ohlin has Brigido Mera-stage renal disease.  Hypertension Blood pressure is well controlled today. No medication changes at this time.   Follow-up:  Return to clinic in 3 months, sooner if needed based on hospital course.  Yvonne Kendall, MD 06/24/2016  12:55 PM

## 2016-06-24 NOTE — Progress Notes (Signed)
CH responded to an OR for an AD. OR requested an AD be provided in Spanish. With the aid of an interpreter, CH educated Pt on the AD. Pt wants his family to review the document. CH left materials with Pt for further review. CH is available for follow up as needed.    06/24/16 1500  Clinical Encounter Type  Visited With Patient;Health care provider  Visit Type Initial;Spiritual support  Referral From Nurse  Consult/Referral To Chaplain  Spiritual Encounters  Spiritual Needs Literature

## 2016-06-24 NOTE — Progress Notes (Signed)
ANTICOAGULATION CONSULT NOTE - Initial Consult  Pharmacy Consult for warfarin  Indication: afib  No Known Allergies  Patient Measurements: Height:  (172.7 cm) Weight: 167 lb (75.8 kg) IBW/kg (Calculated) : 68.4 Heparin Dosing Weight:   Vital Signs: Temp: 97.8 F (36.6 C) (04/25 1413) Temp Source: Oral (04/25 1413) BP: 167/67 (04/25 1413) Pulse Rate: 70 (04/25 1413)  Labs:  Recent Labs  06/24/16 1133  INR 4.9    CrCl cannot be calculated (Patient's most recent lab result is older than the maximum 21 days allowed.).   Medical History: Past Medical History:  Diagnosis Date  . Anginal pain (HCC)   . Coronary artery disease   . Diabetes mellitus without complication (HCC)   . Dialysis patient (HCC)    Tues, Thurs, Sat  . Dyspnea    with exertion  . Elevated lipids   . Enlarged prostate   . ESRD (end stage renal disease) (HCC)    dialysis M-W-F  . Hypertension   . Myocardial infarction (HCC) 08/2015  . Stroke [facility])    Lt side this year July    Medications:  Prescriptions Prior to Admission  Medication Sig Dispense Refill Last Dose  . amLODipine (NORVASC) 2.5 MG tablet Take 2.5 mg by mouth daily.   06/24/2016 at am  . atorvastatin (LIPITOR) 80 MG tablet Take 80 mg by mouth every evening.    06/23/2016 at pm  . carvedilol (COREG) 25 MG tablet Take 1 tablet (25 mg total) by mouth 2 (two) times daily. Please contact the patient's cardiologist to see if this medication should be continued 180 tablet 3 06/24/2016 at am  . ferric citrate (AURYXIA) 1 GM 210 MG(Fe) tablet Take 420 mg by mouth 3 (three) times daily with meals.   06/24/2016 at am  . glipiZIDE (GLUCOTROL) 5 MG tablet Take 5 mg by mouth 2 (two) times daily.    06/24/2016 at am  . insulin glargine (LANTUS) 100 UNIT/ML injection Inject 25 Units into the skin at bedtime.    06/23/2016 at qhs  . insulin regular (NOVOLIN R,HUMULIN R) 100 units/mL injection Inject 0-10 Units into the skin 3 (three) times daily  before meals.    06/23/2016 at Unknown time  . lisinopril (PRINIVIL,ZESTRIL) 2.5 MG tablet Take 2.5 mg by mouth daily.   06/24/2016 at am  . Melatonin 3 MG TABS Take 3 mg by mouth at bedtime.    06/23/2016 at qhs  . nitroGLYCERIN (NITROSTAT) 0.4 MG SL tablet Place 0.4 mg under the tongue every 5 (five) minutes as needed for chest pain.   prn at prn  . tamsulosin (FLOMAX) 0.4 MG CAPS capsule Take 0.4 mg by mouth at bedtime.   06/23/2016 at qhs  . warfarin (COUMADIN) 5 MG tablet Take as directed by [facility] (Patient taking differently: Take 5 mg by mouth daily at 6 PM. ) 35 tablet 3 06/23/2016 at 1800    Assessment: Pharmacy consulted to assist in the dosing and monitoring of warfarin therapy in this 61 year old male with  Atrial fibrillation.   Chronic warfarin dose: 5 mg PO daily                       INR                      Dose 4/25             4.9  Hold   Goal of Therapy:  INR 2-3 Monitor platelets by anticoagulation protocol: Yes   Plan:  Will hold warfarin therapy tonight since INR is supratherapeutic. Will recheck PT/INR with am labs.   Denzal Meir D 06/24/2016,3:40 PM

## 2016-06-24 NOTE — H&P (Signed)
[employer] - [place] at [facility]   PATIENT NAME: Eddie Myers    MR#:  [id number]  DATE OF BIRTH:  March 31, 1955  DATE OF ADMISSION:  06/24/2016  PRIMARY CARE PHYSICIAN: Pcp Not In System   REQUESTING/REFERRING PHYSICIAN: Dr. Cristal Deer End  CHIEF COMPLAINT:  No chief complaint on file. Necrotic Toes, b/l LE pain and redness.   HISTORY OF PRESENT ILLNESS:  Eddie Myers  is a 61 y.o. male with a known history of End-stage renal disease on hemodialysis, diabetes, hypertension, hyperlipidemia, history of previous CVA, history of coronary artery disease status post bypass who presented to the hospital from his cardiologist's office due to bilateral lower extremity necrotic toes redness and pain. Patient says that he has noticed over the past 2 weeks that his left lower extremity has become more painful with some of his toes having significant discoloration. He also noticed that his right second toe also has significant discoloration and pain in it. He went to see his cardiologist for follow-up and they contacted hospitalist service for direct admission. Patient also says about a month ago he lost vision in his right eye but did not seek any help. He has some right-sided peripheral vision.  He denies any headache, nausea, vomiting, chest pain, shortness of breath, fever, chills or any other associated symptoms presently.  PAST MEDICAL HISTORY:   Past Medical History:  Diagnosis Date  . Anginal pain (HCC)   . Coronary artery disease   . Diabetes mellitus without complication (HCC)   . Dialysis patient (HCC)    Tues, Thurs, Sat  . Dyspnea    with exertion  . Elevated lipids   . Enlarged prostate   . ESRD (end stage renal disease) (HCC)    dialysis M-W-F  . Hypertension   . Myocardial infarction (HCC) 08/2015  . Stroke [facility])    Lt side this year July    PAST SURGICAL HISTORY:   Past Surgical History:  Procedure Laterality Date  . AV FISTULA PLACEMENT Left  01/10/2016   Procedure: INSERTION OF ARTERIOVENOUS (AV) GORE-TEX GRAFT ARM ( BRACH / AXILLARY GRAFT );  Surgeon: Renford Dills, MD;  Location: [facility] ORS;  Service: Vascular;  Laterality: Left;  . CARDIAC CATHETERIZATION    . CORONARY ARTERY BYPASS GRAFT  01/2014   [facility] (LIMA -> LAD and SVG -> OM)  . INSERTION OF DIALYSIS CATHETER Right    Perma catheter  . PERIPHERAL VASCULAR CATHETERIZATION N/A 01/17/2016   Procedure: Thrombectomy;  Surgeon: Renford Dills, MD;  Location: [facility] INVASIVE CV LAB;  Service: Cardiovascular;  Laterality: N/A;  . PERIPHERAL VASCULAR CATHETERIZATION Left 01/28/2016   Procedure: A/V Fistulagram;  Surgeon: Renford Dills, MD;  Location: [facility] INVASIVE CV LAB;  Service: Cardiovascular;  Laterality: Left;  . PERIPHERAL VASCULAR CATHETERIZATION N/A 03/24/2016   Procedure: Dialysis/Perma Catheter Removal;  Surgeon: Renford Dills, MD;  Location: [facility] INVASIVE CV LAB;  Service: Cardiovascular;  Laterality: N/A;  . UPPER EXTREMITY ANGIOGRAM Left 01/28/2016   Procedure: Upper Extremity Angiogram;  Surgeon: Renford Dills, MD;  Location: [facility] INVASIVE CV LAB;  Service: Cardiovascular;  Laterality: Left;    SOCIAL HISTORY:   Social History  Substance Use Topics  . Smoking status: Former Smoker    Packs/day: 0.50    Years: 45.00    Quit date: 01/05/2015  . Smokeless tobacco: Never Used  . Alcohol use No    FAMILY HISTORY:   Family History  Problem Relation Age of Onset  .  Cancer Mother   . Diabetes Mother   . Diabetes Father     DRUG ALLERGIES:  No Known Allergies  REVIEW OF SYSTEMS:   Review of Systems  Constitutional: Negative for fever and weight loss.  HENT: Negative for congestion, nosebleeds and tinnitus.   Eyes: Positive for blurred vision. Negative for double vision and redness.  Respiratory: Negative for cough, hemoptysis and shortness of breath.   Cardiovascular: Negative for chest pain, orthopnea, leg swelling and PND.   Gastrointestinal: Negative for abdominal pain, diarrhea, melena, nausea and vomiting.  Genitourinary: Negative for dysuria, hematuria and urgency.  Musculoskeletal: Negative for falls and joint pain.  Neurological: Negative for dizziness, tingling, sensory change, focal weakness, seizures, weakness and headaches.  Endo/Heme/Allergies: Negative for polydipsia. Does not bruise/bleed easily.  Psychiatric/Behavioral: Negative for depression and memory loss. The patient is not nervous/anxious.     MEDICATIONS AT HOME:   Prior to Admission medications   Medication Sig Start Date End Date Taking? Authorizing Provider  amLODipine (NORVASC) 2.5 MG tablet Take 2.5 mg by mouth daily.   Yes Historical Provider, MD  atorvastatin (LIPITOR) 80 MG tablet Take 80 mg by mouth every evening.  02/07/16  Yes Historical Provider, MD  carvedilol (COREG) 25 MG tablet Take 1 tablet (25 mg total) by mouth 2 (two) times daily. Please contact the patient's cardiologist to see if this medication should be continued 03/24/16 06/24/16 Yes Renford Dills, MD  ferric citrate (AURYXIA) 1 GM 210 MG(Fe) tablet Take 420 mg by mouth 3 (three) times daily with meals.   Yes Historical Provider, MD  glipiZIDE (GLUCOTROL) 5 MG tablet Take 5 mg by mouth 2 (two) times daily.    Yes Historical Provider, MD  insulin glargine (LANTUS) 100 UNIT/ML injection Inject 25 Units into the skin at bedtime.    Yes Historical Provider, MD  insulin regular (NOVOLIN R,HUMULIN R) 100 units/mL injection Inject 0-10 Units into the skin 3 (three) times daily before meals.    Yes Historical Provider, MD  lisinopril (PRINIVIL,ZESTRIL) 2.5 MG tablet Take 2.5 mg by mouth daily.   Yes Historical Provider, MD  Melatonin 3 MG TABS Take 3 mg by mouth at bedtime.    Yes Historical Provider, MD  nitroGLYCERIN (NITROSTAT) 0.4 MG SL tablet Place 0.4 mg under the tongue every 5 (five) minutes as needed for chest pain.   Yes Historical Provider, MD  tamsulosin (FLOMAX)  0.4 MG CAPS capsule Take 0.4 mg by mouth at bedtime.   Yes Historical Provider, MD  warfarin (COUMADIN) 5 MG tablet Take as directed by [facility] Patient taking differently: Take 5 mg by mouth daily at 6 PM.  04/08/16  Yes Yvonne Kendall, MD      VITAL SIGNS:  Blood pressure (!) 167/67, pulse 70, temperature 97.8 F (36.6 C), temperature source Oral, resp. rate 16, height  (1.727 m), weight 75.8 kg (167 lb), SpO2 98 %.  PHYSICAL EXAMINATION:  Physical Exam  GENERAL:  61 y.o.-year-old patient lying in the bed in no acute distress.  EYES: Pupils equal, round, reactive to light and accommodation. No scleral icterus. Extraocular muscles intact.  HEENT: Head atraumatic, normocephalic. Oropharynx and nasopharynx clear. No oropharyngeal erythema, moist oral mucosa  NECK:  Supple, no jugular venous distention. No thyroid enlargement, no tenderness.  LUNGS: Normal breath sounds bilaterally, no wheezing, rales, rhonchi. No use of accessory muscles of respiration.  CARDIOVASCULAR: S1, S2 RRR. No murmurs, rubs, gallops, clicks.  ABDOMEN: Soft, nontender, nondistended. Bowel sounds present. No organomegaly  or mass.  EXTREMITIES: No pedal edema, cyanosis, or clubbing. Posterior radial pulses bilaterally, pedal pulses on both extremities on not palpable well. Bilateral lower extremities are cool to touch. Right second toe cyanotic appearing with some necrosis, left foot 2-3 toes with cyanosis and necrosis. NEUROLOGIC: Cranial nerves II through XII are intact. No focal Motor or sensory deficits appreciated b/l PSYCHIATRIC: The patient is alert and oriented x 3. Good affect.  SKIN: No obvious rash, lesion, or ulcer.   LUE A.V fistula with good bruit, thrill.   LABORATORY PANEL:   CBC  Recent Labs Lab 06/24/16 1524  WBC 10.4  HGB 9.9*  HCT 29.0*  PLT 324   ------------------------------------------------------------------------------------------------------------------  Chemistries   No results for input(s): NA, K, CL, CO2, GLUCOSE, BUN, CREATININE, CALCIUM, MG, AST, ALT, ALKPHOS, BILITOT in the last 168 hours.  Invalid input(s): GFRCGP ------------------------------------------------------------------------------------------------------------------  Cardiac Enzymes No results for input(s): TROPONINI in the last 168 hours. ------------------------------------------------------------------------------------------------------------------  RADIOLOGY:  Ct Head Wo Contrast  Result Date: 06/24/2016 CLINICAL DATA:  Episode of visual loss in RIGHT eye 3-4 weeks, still with some visual deficits, history of hypertension, diabetes EXAM: CT HEAD WITHOUT CONTRAST TECHNIQUE: Contiguous axial images were obtained from the base of the skull through the vertex without intravenous contrast. COMPARISON:  None. FINDINGS: Brain: Generalized atrophy. Normal ventricular morphology. No midline shift or mass effect. Small vessel chronic ischemic changes of deep cerebral white matter. Small lacunar infarct in RIGHT thalamus, age-indeterminate. No intracranial hemorrhage, mass lesion or evidence of cortical infarction. No extra-axial fluid collections. Vascular: Atherosclerotic calcifications throughout the carotid siphons. Skull: Intact Sinuses/Orbits: Clear Other: N/A IMPRESSION: Atrophy with small vessel chronic ischemic changes of deep cerebral white matter. 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 No evidence of cortical infarction or intracranial hemorrhage. Electronically Signed   By: Ulyses Southward M.D.   On: 06/24/2016 15:47     IMPRESSION AND PLAN:   Steffen Hase  is a 61 y.o. male with a known history of End-stage renal disease on hemodialysis, diabetes, hypertension, hyperlipidemia, history of previous CVA, history of coronary artery disease status post bypass who presented to the hospital from his cardiologist's office due to bilateral lower extremity necrotic toes redness and  pain.  1. Bilateral lower extremity cyanosis with necrotic toes-patient has significant pain and we'll continue supportive care with pain control. -Consult vascular surgery. Continue Coumadin and INR is actually supratherapeutic.  2. Subacute right sided vision loss-patient had symptoms about a month ago.  -CT head shows a age-indeterminate right sided thalamic CVA, I will get an MRI of the brain, and also consider consulting neurology.  3. End-stage renal disease on hemodialysis-patient gets dialysis on Tuesday Thursday Saturday. Next-we'll consult nephrology.  4. Essential hypertension-continue carvedilol, lisinopril, Norvasc.  5. Diabetes type 2 without complication-continue Lantus, sliding scale insulin.  6. Hyperlipidemia-continue atorvastatin.   All the records are reviewed and case discussed with ED provider. Management plans discussed with the patient, family and they are in agreement.  CODE STATUS: Full code  TOTAL TIME TAKING CARE OF THIS PATIENT: 45 minutes.    Houston Siren M.D on 06/24/2016 at 3:50 PM  Between 7am to 6pm - Pager - [phone]  After 6pm go to [email or web address] - password [email or web address]  [facility]  Office  [phone]  CC: Primary care physician; Pcp Not In System

## 2016-06-25 ENCOUNTER — Inpatient Hospital Stay: Payer: Medicare Other

## 2016-06-25 DIAGNOSIS — H5461 Unqualified visual loss, right eye, normal vision left eye: Secondary | ICD-10-CM

## 2016-06-25 LAB — BASIC METABOLIC PANEL
ANION GAP: 10 (ref 5–15)
BUN: 37 mg/dL — ABNORMAL HIGH (ref 6–20)
CALCIUM: 8 mg/dL — AB (ref 8.9–10.3)
CO2: 28 mmol/L (ref 22–32)
Chloride: 95 mmol/L — ABNORMAL LOW (ref 101–111)
Creatinine, Ser: 7.88 mg/dL — ABNORMAL HIGH (ref 0.61–1.24)
GFR calc non Af Amer: 6 mL/min — ABNORMAL LOW (ref >60)
GFR, EST AFRICAN AMERICAN: 8 mL/min — AB (ref >60)
Glucose, Bld: 110 mg/dL — ABNORMAL HIGH (ref 65–99)
Potassium: 4.2 mmol/L (ref 3.5–5.1)
Sodium: 133 mmol/L — ABNORMAL LOW (ref 135–145)

## 2016-06-25 LAB — CBC
HCT: 29.1 % — ABNORMAL LOW (ref 40.0–52.0)
HEMOGLOBIN: 10.1 g/dL — AB (ref 13.0–18.0)
MCH: 30 pg (ref 26.0–34.0)
MCHC: 34.8 g/dL (ref 32.0–36.0)
MCV: 86.2 fL (ref 80.0–100.0)
Platelets: 265 10*3/uL (ref 150–440)
RBC: 3.37 MIL/uL — AB (ref 4.40–5.90)
RDW: 14.4 % (ref 11.5–14.5)
WBC: 6.7 10*3/uL (ref 3.8–10.6)

## 2016-06-25 LAB — MRSA PCR SCREENING: MRSA by PCR: NEGATIVE

## 2016-06-25 LAB — PHOSPHORUS: Phosphorus: 4.6 mg/dL (ref 2.5–4.6)

## 2016-06-25 LAB — PROTIME-INR
INR: 3.82
PROTHROMBIN TIME: 38.6 s — AB (ref 11.4–15.2)

## 2016-06-25 LAB — GLUCOSE, CAPILLARY
Glucose-Capillary: 77 mg/dL (ref 65–99)
Glucose-Capillary: 149 mg/dL — ABNORMAL HIGH (ref 65–99)
Glucose-Capillary: 189 mg/dL — ABNORMAL HIGH (ref 65–99)

## 2016-06-25 MED ORDER — INSULIN STARTER KIT- PEN NEEDLES (ENGLISH): 1.0000 | Freq: Once | Status: DC

## 2016-06-25 MED ORDER — PENTAFLUOROPROP-TETRAFLUOROETH EX AERO: 1.0000 "application " | INHALATION_SPRAY | CUTANEOUS | Status: DC | PRN

## 2016-06-25 MED ORDER — LIDOCAINE-PRILOCAINE 2.5-2.5 % EX CREA: 1.0000 "application " | TOPICAL_CREAM | CUTANEOUS | Status: DC | PRN

## 2016-06-25 MED ORDER — INSULIN GLARGINE 100 UNIT/ML ~~LOC~~ SOLN
22.0000 [IU] | Freq: Every day | SUBCUTANEOUS | Status: DC
  Administered 2016-06-25 – 2016-06-28 (×4): 22 [IU] via SUBCUTANEOUS
  Administered 2016-06-29: 11 [IU] via SUBCUTANEOUS
  Administered 2016-06-30: 22 [IU] via SUBCUTANEOUS
  Filled 2016-06-25 (×7): qty 0.22

## 2016-06-25 MED ORDER — VITAMIN K1 10 MG/ML IJ SOLN
2.0000 mg | Freq: Once | INTRAMUSCULAR | Status: AC
  Administered 2016-06-25: 2 mg via SUBCUTANEOUS
  Filled 2016-06-25: qty 0.2

## 2016-06-25 MED ORDER — SODIUM CHLORIDE 0.9 % IV SOLN
100.0000 mL | INTRAVENOUS | Status: DC | PRN
  Administered 2016-07-03: 08:00:00 via INTRAVENOUS

## 2016-06-25 MED ORDER — HEPARIN SODIUM (PORCINE) 1000 UNIT/ML DIALYSIS: 1000.0000 [IU] | INTRAMUSCULAR | Status: DC | PRN

## 2016-06-25 MED ORDER — LIVING WELL WITH DIABETES BOOK
Freq: Once | Status: DC
  Filled 2016-06-25 (×2): qty 1

## 2016-06-25 MED ORDER — ALTEPLASE 2 MG IJ SOLR
2.0000 mg | Freq: Once | INTRAMUSCULAR | Status: DC | PRN
Start: 2016-06-25 — End: 2016-07-03

## 2016-06-25 MED ORDER — LIDOCAINE HCL (PF) 1 % IJ SOLN: 5.0000 mL | INTRAMUSCULAR | Status: DC | PRN

## 2016-06-25 MED ORDER — SODIUM CHLORIDE 0.9 % IV SOLN
100.0000 mL | INTRAVENOUS | Status: DC | PRN
  Administered 2016-07-03: 10 mL via INTRAVENOUS

## 2016-06-25 NOTE — Consult Note (Signed)
Reason for Consult: Gangrenous changes to the toes of both feet Referring Physician: [name] is an 61 y.o. male.  HPI: Patient relates a history of some increasing pain and discoloration in the toes of both of his feet over the last 2-3 weeks. Denies any injury. Was referred over to the hospital for admission after a visit with one of his outpatient doctors due to the gangrenous changes in the toes. Admitted for vascular intervention  Past Medical History:  Diagnosis Date  . Anginal pain (Eddie Myers)   . Coronary artery disease   . Diabetes mellitus without complication (Eddie Myers)   . Dialysis patient (Eddie Myers)    Tues, Thurs, Sat  . Dyspnea    with exertion  . Elevated lipids   . Enlarged prostate   . ESRD (end stage renal disease) (Eddie Myers)    dialysis M-W-F  . Hypertension   . Myocardial infarction (Eddie Myers) 08/2015  . Stroke Eddie Myers)    Lt side this year July    Past Surgical History:  Procedure Laterality Date  . AV FISTULA PLACEMENT Left 01/10/2016   Procedure: INSERTION OF ARTERIOVENOUS (AV) GORE-TEX GRAFT ARM ( BRACH / AXILLARY GRAFT );  Surgeon: Katha Cabal, MD;  Location: [facility] ORS;  Service: Vascular;  Laterality: Left;  . CARDIAC CATHETERIZATION    . CORONARY ARTERY BYPASS GRAFT  01/2014   [facility] (LIMA -> LAD and SVG -> OM)  . INSERTION OF DIALYSIS CATHETER Right    Perma catheter  . PERIPHERAL VASCULAR CATHETERIZATION N/A 01/17/2016   Procedure: Thrombectomy;  Surgeon: Katha Cabal, MD;  Location: [place] CV LAB;  Service: Cardiovascular;  Laterality: N/A;  . PERIPHERAL VASCULAR CATHETERIZATION Left 01/28/2016   Procedure: A/V Fistulagram;  Surgeon: Katha Cabal, MD;  Location: [place] CV LAB;  Service: Cardiovascular;  Laterality: Left;  . PERIPHERAL VASCULAR CATHETERIZATION N/A 03/24/2016   Procedure: Dialysis/Perma Catheter Removal;  Surgeon: Katha Cabal, MD;  Location: [place] CV LAB;  Service: Cardiovascular;  Laterality: N/A;   . UPPER EXTREMITY ANGIOGRAM Left 01/28/2016   Procedure: Upper Extremity Angiogram;  Surgeon: Katha Cabal, MD;  Location: [place] CV LAB;  Service: Cardiovascular;  Laterality: Left;    Family History  Problem Relation Age of Onset  . Cancer Mother   . Diabetes Mother   . Diabetes Father     Social History:  reports that he quit smoking about 17 months ago. He has a 22.50 pack-year smoking history. He has never used smokeless tobacco. He reports that he does not drink alcohol or use drugs.  Allergies: No Known Allergies  Medications:  Scheduled: . amLODipine  2.5 mg Oral Daily  . atorvastatin  80 mg Oral QPM  . carvedilol  25 mg Oral BID WC  . ferric citrate  420 mg Oral TID WC  . insulin aspart  0-5 Units Subcutaneous QHS  . insulin aspart  0-9 Units Subcutaneous TID WC  . insulin glargine  22 Units Subcutaneous QHS  . lisinopril  2.5 mg Oral Daily  . living well with diabetes book   Does not apply Once  . Melatonin  5 mg Oral QHS  . pneumococcal 23 valent vaccine  0.5 mL Intramuscular Tomorrow-1000  . tamsulosin  0.4 mg Oral QHS    Results for orders placed or performed during the hospital encounter of 06/24/16 (from the past 48 hour(s))  MRSA PCR Screening     Status: None   Collection Time: 06/24/16  5:05 AM  Result Value Ref Range   MRSA by PCR NEGATIVE NEGATIVE    Comment:        The GeneXpert MRSA Assay (FDA approved for NASAL specimens only), is one component of a comprehensive MRSA colonization surveillance program. It is not intended to diagnose MRSA infection nor to guide or monitor treatment for MRSA infections.   CBC     Status: Abnormal   Collection Time: 06/24/16  3:24 PM  Result Value Ref Range   WBC 10.4 3.8 - 10.6 K/uL   RBC 3.38 (L) 4.40 - 5.90 MIL/uL   Hemoglobin 9.9 (L) 13.0 - 18.0 g/dL   HCT 29.0 (L) 40.0 - 52.0 %   MCV 85.8 80.0 - 100.0 fL   MCH 29.2 26.0 - 34.0 pg   MCHC 34.0 32.0 - 36.0 g/dL   RDW 14.5 11.5 - 14.5 %    Platelets 324 150 - 440 K/uL  Comprehensive metabolic panel     Status: Abnormal   Collection Time: 06/24/16  3:24 PM  Result Value Ref Range   Sodium 132 (L) 135 - 145 mmol/L   Potassium 4.0 3.5 - 5.1 mmol/L   Chloride 94 (L) 101 - 111 mmol/L   CO2 28 22 - 32 mmol/L   Glucose, Bld 61 (L) 65 - 99 mg/dL   BUN 26 (H) 6 - 20 mg/dL   Creatinine, Ser 6.66 (H) 0.61 - 1.24 mg/dL   Calcium 8.3 (L) 8.9 - 10.3 mg/dL   Total Protein 8.0 6.5 - 8.1 g/dL   Albumin 3.2 (L) 3.5 - 5.0 g/dL   AST 15 15 - 41 U/L   ALT 25 17 - 63 U/L   Alkaline Phosphatase 75 38 - 126 U/L   Total Bilirubin 0.6 0.3 - 1.2 mg/dL   GFR calc non Af Amer 8 (L) >60 mL/min   GFR calc Af Amer 9 (L) >60 mL/min    Comment: (NOTE) The eGFR has been calculated using the CKD EPI equation. This calculation has not been validated in all clinical situations. eGFR's persistently <60 mL/min signify possible Chronic Kidney Disease.    Anion gap 10 5 - 15  Protime-INR     Status: Abnormal   Collection Time: 06/24/16  3:24 PM  Result Value Ref Range   Prothrombin Time 36.2 (H) 11.4 - 15.2 seconds   INR 3.53   Glucose, capillary     Status: Abnormal   Collection Time: 06/24/16  4:39 PM  Result Value Ref Range   Glucose-Capillary 50 (L) 65 - 99 mg/dL  Glucose, capillary     Status: Abnormal   Collection Time: 06/24/16  6:21 PM  Result Value Ref Range   Glucose-Capillary 153 (H) 65 - 99 mg/dL  Glucose, capillary     Status: Abnormal   Collection Time: 06/24/16  9:09 PM  Result Value Ref Range   Glucose-Capillary 125 (H) 65 - 99 mg/dL  Basic metabolic panel     Status: Abnormal   Collection Time: 06/25/16  4:34 AM  Result Value Ref Range   Sodium 133 (L) 135 - 145 mmol/L   Potassium 4.2 3.5 - 5.1 mmol/L   Chloride 95 (L) 101 - 111 mmol/L   CO2 28 22 - 32 mmol/L   Glucose, Bld 110 (H) 65 - 99 mg/dL   BUN 37 (H) 6 - 20 mg/dL   Creatinine, Ser 7.88 (H) 0.61 - 1.24 mg/dL   Calcium 8.0 (L) 8.9 - 10.3 mg/dL   GFR calc non Af Wyvonnia Lora  6 (L) >60 mL/min   GFR calc Af Amer 8 (L) >60 mL/min    Comment: (NOTE) The eGFR has been calculated using the CKD EPI equation. This calculation has not been validated in all clinical situations. eGFR's persistently <60 mL/min signify possible Chronic Kidney Disease.    Anion gap 10 5 - 15  CBC     Status: Abnormal   Collection Time: 06/25/16  4:34 AM  Result Value Ref Range   WBC 6.7 3.8 - 10.6 K/uL   RBC 3.37 (L) 4.40 - 5.90 MIL/uL   Hemoglobin 10.1 (L) 13.0 - 18.0 g/dL   HCT 29.1 (L) 40.0 - 52.0 %   MCV 86.2 80.0 - 100.0 fL   MCH 30.0 26.0 - 34.0 pg   MCHC 34.8 32.0 - 36.0 g/dL   RDW 14.4 11.5 - 14.5 %   Platelets 265 150 - 440 K/uL  Protime-INR     Status: Abnormal   Collection Time: 06/25/16  4:34 AM  Result Value Ref Range   Prothrombin Time 38.6 (H) 11.4 - 15.2 seconds   INR 3.82   Glucose, capillary     Status: None   Collection Time: 06/25/16  7:40 AM  Result Value Ref Range   Glucose-Capillary 77 65 - 99 mg/dL  Phosphorus     Status: None   Collection Time: 06/25/16  9:30 AM  Result Value Ref Range   Phosphorus 4.6 2.5 - 4.6 mg/dL  Glucose, capillary     Status: Abnormal   Collection Time: 06/25/16  4:33 PM  Result Value Ref Range   Glucose-Capillary 149 (H) 65 - 99 mg/dL    Ct Head Wo Contrast  Result Date: 06/24/2016 CLINICAL DATA:  Episode of visual loss in RIGHT eye 3-4 weeks, still with some visual deficits, history of hypertension, diabetes EXAM: CT HEAD WITHOUT CONTRAST TECHNIQUE: Contiguous axial images were obtained from the base of the skull through the vertex without intravenous contrast. COMPARISON:  None. FINDINGS: Brain: Generalized atrophy. Normal ventricular morphology. No midline shift or mass effect. Small vessel chronic ischemic changes of deep cerebral white matter. Small lacunar infarct in RIGHT thalamus, age-indeterminate. No intracranial hemorrhage, mass lesion or evidence of cortical infarction. No extra-axial fluid collections. Vascular:  Atherosclerotic calcifications throughout the carotid siphons. Skull: Intact Sinuses/Orbits: Clear Other: N/A IMPRESSION: Atrophy with small vessel chronic ischemic changes of deep cerebral white matter. 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 No evidence of cortical infarction or intracranial hemorrhage. Electronically Signed   By: Lavonia Dana M.D.   On: 06/24/2016 15:47    Review of Systems  Constitutional: Negative for chills and fever.  HENT: Negative.   Eyes: Positive for blurred vision.  Respiratory: Negative.   Cardiovascular: Negative.   Gastrointestinal: Negative for nausea and vomiting.  Genitourinary: Negative.   Musculoskeletal:       Relates significant pain in both of his feet and toes.  Skin:       The patient relates some dark discolorations in the toes of both feet. Denies any bleeding.  Neurological:       Does relate some paresthesias in the feet  Endo/Heme/Allergies: Negative.   Psychiatric/Behavioral: Negative.    Blood pressure (!) 173/71, pulse 86, temperature 98 F (36.7 C), temperature source Oral, resp. rate 12, height 5' 8"  (1.727 m), weight 75.1 kg (165 lb 9.1 oz), SpO2 96 %. Physical Exam  Cardiovascular:  DP pulses are thready at best bilateral. PT pulses could not clearly be palpated.  Musculoskeletal:  Adequate  range of motion pedal joints. Some guarding secondary to pain. Muscle testing within normal limits  Neurological:  Protective threshold monofilament wire appears to be intact and symmetric to the forefoot and digits. Proprioception appears to be intact as well.  Skin:  The skin is cool dry and atrophic with absent hair growth. Dry gangrenous changes noted at the distal tip of the left hallux as well as mildly along the dorsal aspect of the right second toe. Some cyanotic discoloration is noted in toes one 2 and 3 on the left and in the right second. No open lesions or drainage.    Assessment/Plan: Assessment: 1. Dry gangrenous  changes multiple toes both feet. 2. Diabetes with peripheral vascular disease.  Plan: Discussed with the patient through a family member that at this point the toes appear to be stable as there does not appear to be any infection or open sores. Patient is scheduled to undergo vascular intervention on the left lower extremity tomorrow and at this point think we can most likely wait a week or 2 to evaluate for demarcation and need for amputation. We will follow the patient periodically throughout his hospital stay.  Durward Fortes 06/25/2016, 6:21 PM

## 2016-06-25 NOTE — Progress Notes (Signed)
[facility]  ROUNDING NOTE   Subjective:  Patient well-known that was from outpatient hemodialysis. He comes in with gangrenous changes on toes from both feet. Patient completed hemodialysis today.   Objective:  Vital signs in last 24 hours:  Temp:  [97.8 F (36.6 C)-98.4 F (36.9 C)] 98.4 F (36.9 C) (04/26 1105) Pulse Rate:  [70-85] 79 (04/26 1315) Resp:  [12-20] 13 (04/26 1315) BP: (133-167)/(51-76) 166/76 (04/26 1315) SpO2:  [92 %-100 %] 95 % (04/26 1315) Weight:  [75.8 kg (167 lb)-77 kg (169 lb 12.1 oz)] 77 kg (169 lb 12.1 oz) (04/26 1105)  Weight change:  Filed Weights   06/24/16 1414 06/25/16 1105  Weight: 75.8 kg (167 lb) 77 kg (169 lb 12.1 oz)    Intake/Output: I/O last 3 completed shifts: In: 480 [P.O.:480] Out: -    Intake/Output this shift:  Total I/O In: 240 [P.O.:240] Out: 0   Physical Exam: General: No acute distress  Head: Normocephalic, atraumatic. Moist oral mucosal membranes  Eyes: Anicteric  Neck: Supple, trachea midline  Lungs:  Clear to auscultation, normal effort  Heart: S1S2 no rubs  Abdomen:  Soft, nontender, BS present  Extremities: Trace peripheral edema. Gangrene noted on toes of both feet  Neurologic: Nonfocal, moving all four extremities  Skin: No lesions  Access: LUE AV access    Basic Metabolic Panel:  Recent Labs Lab 06/24/16 1524 06/25/16 0434 06/25/16 0930  NA 132* 133*  --   K 4.0 4.2  --   CL 94* 95*  --   CO2 28 28  --   GLUCOSE 61* 110*  --   BUN 26* 37*  --   CREATININE 6.66* 7.88*  --   CALCIUM 8.3* 8.0*  --   PHOS  --   --  4.6    Liver Function Tests:  Recent Labs Lab 06/24/16 1524  AST 15  ALT 25  ALKPHOS 75  BILITOT 0.6  PROT 8.0  ALBUMIN 3.2*   No results for input(s): LIPASE, AMYLASE in the last 168 hours. No results for input(s): AMMONIA in the last 168 hours.  CBC:  Recent Labs Lab 06/24/16 1524 06/25/16 0434  WBC 10.4 6.7  HGB 9.9* 10.1*  HCT 29.0* 29.1*  MCV  85.8 86.2  PLT 324 265    Cardiac Enzymes: No results for input(s): CKTOTAL, CKMB, CKMBINDEX, TROPONINI in the last 168 hours.  BNP: Invalid input(s): POCBNP  CBG:  Recent Labs Lab 06/24/16 1639 06/24/16 1821 06/24/16 2109 06/25/16 0740  GLUCAP 50* 153* 125* 77    Microbiology: Results for orders placed or performed during the hospital encounter of 06/24/16  MRSA PCR Screening     Status: None   Collection Time: 06/24/16  5:05 AM  Result Value Ref Range Status   MRSA by PCR NEGATIVE NEGATIVE Final    Comment:        The GeneXpert MRSA Assay (FDA approved for NASAL specimens only), is one component of a comprehensive MRSA colonization surveillance program. It is not intended to diagnose MRSA infection nor to guide or monitor treatment for MRSA infections.     Coagulation Studies:  Recent Labs  06/24/16 1133 06/24/16 1524 06/25/16 0434  LABPROT  --  36.2* 38.6*  INR 4.9 3.53 3.82    Urinalysis: No results for input(s): COLORURINE, LABSPEC, PHURINE, GLUCOSEU, HGBUR, BILIRUBINUR, KETONESUR, PROTEINUR, UROBILINOGEN, NITRITE, LEUKOCYTESUR in the last 72 hours.  Invalid input(s): APPERANCEUR    Imaging: Ct Head Wo Contrast  Result Date: 06/24/2016  CLINICAL DATA:  Episode of visual loss in RIGHT eye 3-4 weeks, still with some visual deficits, history of hypertension, diabetes EXAM: CT HEAD WITHOUT CONTRAST TECHNIQUE: Contiguous axial images were obtained from the base of the skull through the vertex without intravenous contrast. COMPARISON:  None. FINDINGS: Brain: Generalized atrophy. Normal ventricular morphology. No midline shift or mass effect. Small vessel chronic ischemic changes of deep cerebral white matter. Small lacunar infarct in RIGHT thalamus, age-indeterminate. No intracranial hemorrhage, mass lesion or evidence of cortical infarction. No extra-axial fluid collections. Vascular: Atherosclerotic calcifications throughout the carotid siphons. Skull:  Intact Sinuses/Orbits: Clear Other: N/A IMPRESSION: Atrophy with small vessel chronic ischemic changes of deep cerebral white matter. 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 No evidence of cortical infarction or intracranial hemorrhage. Electronically Signed   By: Ulyses Southward M.D.   On: 06/24/2016 15:47     Medications:   . sodium chloride    . sodium chloride     . amLODipine  2.5 mg Oral Daily  . atorvastatin  80 mg Oral QPM  . carvedilol  25 mg Oral BID WC  . ferric citrate  420 mg Oral TID WC  . insulin aspart  0-5 Units Subcutaneous QHS  . insulin aspart  0-9 Units Subcutaneous TID WC  . insulin glargine  22 Units Subcutaneous QHS  . lisinopril  2.5 mg Oral Daily  . living well with diabetes book   Does not apply Once  . Melatonin  5 mg Oral QHS  . pneumococcal 23 valent vaccine  0.5 mL Intramuscular Tomorrow-1000  . tamsulosin  0.4 mg Oral QHS   sodium chloride, sodium chloride, acetaminophen **OR** acetaminophen, alteplase, heparin, lidocaine (PF), lidocaine-prilocaine, morphine injection, nitroGLYCERIN, ondansetron **OR** ondansetron (ZOFRAN) IV, oxyCODONE, pentafluoroprop-tetrafluoroeth  Assessment/ Plan:  61 y.o. male ESRD on HD TTS, coronary artery disease, diabetes mellitus type 2, hyperlipidemia, hypertension, history of CVA, anemia chronic kidney disease, secondary hyperparathyroidism who presents with gangrenous changes on toes of both feet.  1.  ESRD on HD TTS: Patient completed hemodialysis today.  We will plan for dialysis again on Saturday.  2.  Anemia chronic kidney disease.  Hemoglobin currently 10.1.  We will likely resume the patient on Epogen with the Calluses treatment.  3.  Secondary hyperparathyroidism.  Continue ferric citrate 420 mg by mouth 3 times a day.  4.  Hypertension.  Continue amlodipine,carvedilol, and lisinopril.   LOS: 1 Araiyah Cumpton 4/26/20182:05 PM

## 2016-06-25 NOTE — Progress Notes (Signed)
  End of hd 

## 2016-06-25 NOTE — Progress Notes (Signed)
Post hd assessment 

## 2016-06-25 NOTE — Progress Notes (Signed)
Inpatient Diabetes Program Recommendations  AACE/ADA: New Consensus Statement on Inpatient Glycemic Control (2015)  Target Ranges:  Prepandial:   less than 140 mg/dL      Peak postprandial:   less than 180 mg/dL (1-2 hours)      Critically ill patients:  140 - 180 mg/dL   Lab Results  Component Value Date   GLUCAP 77 06/25/2016    Review of Glycemic Control  Results for NIGEL, Eddie Myers (MRN [id number]) as of 06/25/2016 07:48  Ref. Range 06/24/2016 16:39 06/24/2016 18:21 06/24/2016 21:09 06/25/2016 07:40  Glucose-Capillary Latest Ref Range: 65 - 99 mg/dL 50 (L) 829 (H) 562 (H) 77    Diabetes history: Type 2 diabetes Outpatient Diabetes medications: Glucotrol  bid, Lantus 25 units qhs, R insulin 0-10 units tid with meals  Current orders for Inpatient glycemic control: Lantus 25 units qhs, Glucotrol  bid, Novolog 0-9 units tid, Novolog 0-5 units qhs  Inpatient Diabetes Program Recommendations:  Please decrease Lantus insulin to 19 units qhs, d/c Glucotrol while inpatient.  Susette Racer, RN, BA, MHA, CDE Diabetes Coordinator Inpatient Diabetes Program  [phone] (Team Pager) [phone] [facility] Office) 06/25/2016 8:03 AM

## 2016-06-25 NOTE — Progress Notes (Signed)
Hd start 

## 2016-06-25 NOTE — Progress Notes (Signed)
Post hd vitals 

## 2016-06-25 NOTE — Consult Note (Signed)
Reason for Consult: R eye vision loss  Referring Physician: Dr. Elpidio Anis  CC: R eye vision loss   HPI: Eddie Myers is an 61 y.o. male male with a known history of End-stage renal disease on hemodialysis, diabetes, hypertension, hyperlipidemia, history of previous CVA, history of coronary artery disease status post bypass who presented to the hospital from his cardiologist's office due to bilateral lower extremity necrotic toes redness and pain. On further questioning patient states that he has loss of vision in the R eye  Past Medical History:  Diagnosis Date  . Anginal pain (HCC)   . Coronary artery disease   . Diabetes mellitus without complication (HCC)   . Dialysis patient (HCC)    Tues, Thurs, Sat  . Dyspnea    with exertion  . Elevated lipids   . Enlarged prostate   . ESRD (end stage renal disease) (HCC)    dialysis M-W-F  . Hypertension   . Myocardial infarction (HCC) 08/2015  . Stroke [facility])    Lt side this year July    Past Surgical History:  Procedure Laterality Date  . AV FISTULA PLACEMENT Left 01/10/2016   Procedure: INSERTION OF ARTERIOVENOUS (AV) GORE-TEX GRAFT ARM ( BRACH / AXILLARY GRAFT );  Surgeon: Renford Dills, MD;  Location: [facility] ORS;  Service: Vascular;  Laterality: Left;  . CARDIAC CATHETERIZATION    . CORONARY ARTERY BYPASS GRAFT  01/2014   [facility] (LIMA -> LAD and SVG -> OM)  . INSERTION OF DIALYSIS CATHETER Right    Perma catheter  . PERIPHERAL VASCULAR CATHETERIZATION N/A 01/17/2016   Procedure: Thrombectomy;  Surgeon: Renford Dills, MD;  Location: [facility] INVASIVE CV LAB;  Service: Cardiovascular;  Laterality: N/A;  . PERIPHERAL VASCULAR CATHETERIZATION Left 01/28/2016   Procedure: A/V Fistulagram;  Surgeon: Renford Dills, MD;  Location: [facility] INVASIVE CV LAB;  Service: Cardiovascular;  Laterality: Left;  . PERIPHERAL VASCULAR CATHETERIZATION N/A 03/24/2016   Procedure: Dialysis/Perma Catheter Removal;  Surgeon: Renford Dills, MD;   Location: [facility] INVASIVE CV LAB;  Service: Cardiovascular;  Laterality: N/A;  . UPPER EXTREMITY ANGIOGRAM Left 01/28/2016   Procedure: Upper Extremity Angiogram;  Surgeon: Renford Dills, MD;  Location: [facility] INVASIVE CV LAB;  Service: Cardiovascular;  Laterality: Left;    Family History  Problem Relation Age of Onset  . Cancer Mother   . Diabetes Mother   . Diabetes Father     Social History:  reports that he quit smoking about 17 months ago. He has a 22.50 pack-year smoking history. He has never used smokeless tobacco. He reports that he does not drink alcohol or use drugs.  No Known Allergies  Medications: I have reviewed the patient's current medications.  ROS: History obtained from the patient  General ROS: negative for - chills, fatigue, fever, night sweats, weight gain or weight loss Psychological ROS: negative for - behavioral disorder, hallucinations, memory difficulties, mood swings or suicidal ideation Ophthalmic ROS: negative for - blurry vision, double vision, eye pain or loss of vision ENT ROS: negative for - epistaxis, nasal discharge, oral lesions, sore throat, tinnitus or vertigo Allergy and Immunology ROS: negative for - hives or itchy/watery eyes Hematological and Lymphatic ROS: negative for - bleeding problems, bruising or swollen lymph nodes Endocrine ROS: hx of DM Respiratory ROS: negative for - cough, hemoptysis, shortness of breath or wheezing Cardiovascular ROS: negative for - chest pain, dyspnea on exertion, edema or irregular heartbeat Gastrointestinal ROS: negative for - abdominal pain, diarrhea, hematemesis, nausea/vomiting or  stool incontinence Genito-Urinary ROS: ESRD on HD Musculoskeletal ROS: negative for - joint swelling or muscular weakness Neurological ROS: as noted in HPI Dermatological ROS: necrotic toes  Physical Examination: Blood pressure (!) 166/76, pulse 79, temperature 98.4 F (36.9 C), temperature source Oral, resp. rate 13, height   (1.727 m), weight 77 kg (169 lb 12.1 oz), SpO2 95 %.   Neurological Examination   Mental Status: Alert, oriented, thought content appropriate.  Speech fluent without evidence of aphasia.  Able to follow 3 step commands without difficulty. Cranial Nerves: II: Pt has loss of vision of R eye III,IV, VI: ptosis not present, extra-ocular motions intact bilaterally V,VII: smile symmetric, facial light touch sensation normal bilaterally VIII: hearing normal bilaterally IX,X: gag reflex present XI: bilateral shoulder shrug XII: midline tongue extension Motor: Right : Upper extremity   5/5    Left:     Upper extremity   5/5  Lower extremity   4/5     Lower extremity   4/5 Tone and bulk:normal tone throughout; no atrophy noted Sensory: Pinprick and light touch intact throughout, bilaterally Deep Tendon Reflexes: 1+ and symmetric throughout Plantars: not tested due to necrotic toes   Cerebellar: Not tested Gait: not tested      Laboratory Studies:   Basic Metabolic Panel:  Recent Labs Lab 06/24/16 1524 06/25/16 0434 06/25/16 0930  NA 132* 133*  --   K 4.0 4.2  --   CL 94* 95*  --   CO2 28 28  --   GLUCOSE 61* 110*  --   BUN 26* 37*  --   CREATININE 6.66* 7.88*  --   CALCIUM 8.3* 8.0*  --   PHOS  --   --  4.6    Liver Function Tests:  Recent Labs Lab 06/24/16 1524  AST 15  ALT 25  ALKPHOS 75  BILITOT 0.6  PROT 8.0  ALBUMIN 3.2*   No results for input(s): LIPASE, AMYLASE in the last 168 hours. No results for input(s): AMMONIA in the last 168 hours.  CBC:  Recent Labs Lab 06/24/16 1524 06/25/16 0434  WBC 10.4 6.7  HGB 9.9* 10.1*  HCT 29.0* 29.1*  MCV 85.8 86.2  PLT 324 265    Cardiac Enzymes: No results for input(s): CKTOTAL, CKMB, CKMBINDEX, TROPONINI in the last 168 hours.  BNP: Invalid input(s): POCBNP  CBG:  Recent Labs Lab 06/24/16 1639 06/24/16 1821 06/24/16 2109 06/25/16 0740  GLUCAP 50* 153* 125* 77    Microbiology: Results  for orders placed or performed during the hospital encounter of 06/24/16  MRSA PCR Screening     Status: None   Collection Time: 06/24/16  5:05 AM  Result Value Ref Range Status   MRSA by PCR NEGATIVE NEGATIVE Final    Comment:        The GeneXpert MRSA Assay (FDA approved for NASAL specimens only), is one component of a comprehensive MRSA colonization surveillance program. It is not intended to diagnose MRSA infection nor to guide or monitor treatment for MRSA infections.     Coagulation Studies:  Recent Labs  06/24/16 1133 06/24/16 1524 06/25/16 0434  LABPROT  --  36.2* 38.6*  INR 4.9 3.53 3.82    Urinalysis: No results for input(s): COLORURINE, LABSPEC, PHURINE, GLUCOSEU, HGBUR, BILIRUBINUR, KETONESUR, PROTEINUR, UROBILINOGEN, NITRITE, LEUKOCYTESUR in the last 168 hours.  Invalid input(s): APPERANCEUR  Lipid Panel:  No results found for: CHOL, TRIG, HDL, CHOLHDL, VLDL, LDLCALC  HgbA1C: No results found for: HGBA1C  Urine  Drug Screen:  No results found for: LABOPIA, COCAINSCRNUR, LABBENZ, AMPHETMU, THCU, LABBARB  Alcohol Level: No results for input(s): ETH in the last 168 hours.  Other results: EKG: normal EKG, normal sinus rhythm, unchanged from previous tracings.  Imaging: Ct Head Wo Contrast  Result Date: 06/24/2016 CLINICAL DATA:  Episode of visual loss in RIGHT eye 3-4 weeks, still with some visual deficits, history of hypertension, diabetes EXAM: CT HEAD WITHOUT CONTRAST TECHNIQUE: Contiguous axial images were obtained from the base of the skull through the vertex without intravenous contrast. COMPARISON:  None. FINDINGS: Brain: Generalized atrophy. Normal ventricular morphology. No midline shift or mass effect. Small vessel chronic ischemic changes of deep cerebral white matter. Small lacunar infarct in RIGHT thalamus, age-indeterminate. No intracranial hemorrhage, mass lesion or evidence of cortical infarction. No extra-axial fluid collections. Vascular:  Atherosclerotic calcifications throughout the carotid siphons. Skull: Intact Sinuses/Orbits: Clear Other: N/A IMPRESSION: Atrophy with small vessel chronic ischemic changes of deep cerebral white matter. 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 No evidence of cortical infarction or intracranial hemorrhage. Electronically Signed   By: Ulyses Southward M.D.   On: 06/24/2016 15:47     Assessment/Plan:  61 y.o. male male with a known history of End-stage renal disease on hemodialysis, diabetes, hypertension, hyperlipidemia, history of previous CVA, history of coronary artery disease status post bypass who presented to the hospital from his cardiologist's office due to bilateral lower extremity necrotic toes redness and pain. 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  - Pt has complete loss of vision perception in R eye - No driving  - Pt is on coumadin at home with INR of 4.9 on admission. Currently of 3.82 - Would add ASA 81 daily when INr goes down below 3.   Pauletta Browns   06/25/2016, 2:20 PM

## 2016-06-25 NOTE — Progress Notes (Signed)
Pre hd info 

## 2016-06-25 NOTE — Progress Notes (Signed)
Pre hd assessment  

## 2016-06-26 ENCOUNTER — Inpatient Hospital Stay: Payer: Medicare Other

## 2016-06-26 ENCOUNTER — Encounter
Admission: AD | Disposition: A | Discharge status: Skilled Nursing Facility | Payer: Self-pay | Source: Ambulatory Visit | Attending: Internal Medicine

## 2016-06-26 DIAGNOSIS — I70262 Atherosclerosis of native arteries of extremities with gangrene, left leg: Secondary | ICD-10-CM

## 2016-06-26 HISTORY — PX: ABDOMINAL AORTOGRAM W/LOWER EXTREMITY: CATH118223

## 2016-06-26 LAB — PROTIME-INR
INR: 1.45
PROTHROMBIN TIME: 17.8 s — AB (ref 11.4–15.2)

## 2016-06-26 LAB — GLUCOSE, CAPILLARY
GLUCOSE-CAPILLARY: 123 mg/dL — AB (ref 65–99)
GLUCOSE-CAPILLARY: 90 mg/dL (ref 65–99)
Glucose-Capillary: 80 mg/dL (ref 65–99)
Glucose-Capillary: 66 mg/dL (ref 65–99)
Glucose-Capillary: 125 mg/dL — ABNORMAL HIGH (ref 65–99)

## 2016-06-26 LAB — PARATHYROID HORMONE, INTACT (NO CA): PTH: 173 pg/mL — AB (ref 15–65)

## 2016-06-26 LAB — HEPARIN LEVEL (UNFRACTIONATED): HEPARIN UNFRACTIONATED: 0.18 [IU]/mL — AB (ref 0.30–0.70)

## 2016-06-26 LAB — APTT: aPTT: 43 seconds — ABNORMAL HIGH (ref 24–36)

## 2016-06-26 SURGERY — ABDOMINAL AORTOGRAM W/LOWER EXTREMITY
Anesthesia: Moderate Sedation | Laterality: Left

## 2016-06-26 MED ORDER — IOPAMIDOL (ISOVUE-300) INJECTION 61%
INTRAVENOUS | Status: DC | PRN
  Administered 2016-06-26: 80 mL via INTRA_ARTERIAL

## 2016-06-26 MED ORDER — FENTANYL CITRATE (PF) 100 MCG/2ML IJ SOLN
INTRAMUSCULAR | Status: AC
  Filled 2016-06-26: qty 2

## 2016-06-26 MED ORDER — FENTANYL CITRATE (PF) 100 MCG/2ML IJ SOLN
INTRAMUSCULAR | Status: DC | PRN
  Administered 2016-06-26 (×3): 50 ug via INTRAVENOUS

## 2016-06-26 MED ORDER — HEPARIN SODIUM (PORCINE) 1000 UNIT/ML IJ SOLN
INTRAMUSCULAR | Status: DC | PRN
  Administered 2016-06-26: 5000 [IU] via INTRAVENOUS

## 2016-06-26 MED ORDER — CEFAZOLIN SODIUM-DEXTROSE 1-4 GM/50ML-% IV SOLN
1.0000 g | Freq: Once | INTRAVENOUS | Status: AC
  Administered 2016-06-26: 1 g via INTRAVENOUS

## 2016-06-26 MED ORDER — MIDAZOLAM HCL 5 MG/5ML IJ SOLN
INTRAMUSCULAR | Status: AC
  Filled 2016-06-26: qty 5

## 2016-06-26 MED ORDER — FERRIC CITRATE 1 GM 210 MG(FE) PO TABS
420.0000 mg | ORAL_TABLET | Freq: Three times a day (TID) | ORAL | Status: DC
  Administered 2016-06-26 – 2016-07-08 (×25): 420 mg via ORAL
  Filled 2016-06-26 (×38): qty 2

## 2016-06-26 MED ORDER — CLOPIDOGREL BISULFATE 300 MG PO TABS
300.0000 mg | ORAL_TABLET | Freq: Once | ORAL | Status: AC
  Administered 2016-06-26: 300 mg via ORAL
  Filled 2016-06-26: qty 1

## 2016-06-26 MED ORDER — CLOPIDOGREL BISULFATE 75 MG PO TABS
75.0000 mg | ORAL_TABLET | Freq: Every day | ORAL | Status: DC
  Administered 2016-06-27 – 2016-07-06 (×8): 75 mg via ORAL
  Filled 2016-06-26 (×8): qty 1

## 2016-06-26 MED ORDER — HEPARIN (PORCINE) IN NACL 2-0.9 UNIT/ML-% IJ SOLN
INTRAMUSCULAR | Status: AC
  Filled 2016-06-26: qty 1000

## 2016-06-26 MED ORDER — EPOETIN ALFA 10000 UNIT/ML IJ SOLN
4000.0000 [IU] | INTRAMUSCULAR | Status: DC
  Administered 2016-06-27 – 2016-07-04 (×2): 4000 [IU] via INTRAVENOUS

## 2016-06-26 MED ORDER — DIPHENHYDRAMINE HCL 50 MG/ML IJ SOLN
INTRAMUSCULAR | Status: DC | PRN
  Administered 2016-06-26: 50 mg via INTRAVENOUS

## 2016-06-26 MED ORDER — SODIUM CHLORIDE 0.9 % IV SOLN
INTRAVENOUS | Status: DC | PRN
  Administered 2016-06-26: 200 mL via INTRAVENOUS

## 2016-06-26 MED ORDER — ASPIRIN EC 81 MG PO TBEC
81.0000 mg | DELAYED_RELEASE_TABLET | Freq: Every day | ORAL | Status: DC
  Administered 2016-06-26 – 2016-07-06 (×9): 81 mg via ORAL
  Filled 2016-06-26 (×9): qty 1

## 2016-06-26 MED ORDER — OXYCODONE HCL 5 MG PO TABS
ORAL_TABLET | ORAL | Status: AC
  Filled 2016-06-26: qty 2

## 2016-06-26 MED ORDER — HEPARIN BOLUS VIA INFUSION: 4000.0000 [IU] | Freq: Once | INTRAVENOUS | Status: DC

## 2016-06-26 MED ORDER — DIPHENHYDRAMINE HCL 50 MG/ML IJ SOLN
INTRAMUSCULAR | Status: AC
  Filled 2016-06-26: qty 1

## 2016-06-26 MED ORDER — MIDAZOLAM HCL 2 MG/2ML IJ SOLN
INTRAMUSCULAR | Status: DC | PRN
  Administered 2016-06-26: 2 mg via INTRAVENOUS
  Administered 2016-06-26: 1 mg via INTRAVENOUS
  Administered 2016-06-26: 2 mg via INTRAVENOUS
  Administered 2016-06-26: 1 mg via INTRAVENOUS

## 2016-06-26 MED ORDER — HEPARIN (PORCINE) IN NACL 100-0.45 UNIT/ML-% IJ SOLN
1600.0000 [IU]/h | INTRAMUSCULAR | Status: DC
  Administered 2016-06-26: 1050 [IU]/h via INTRAVENOUS
  Administered 2016-06-27: 1200 [IU]/h via INTRAVENOUS
  Administered 2016-06-28 – 2016-06-29 (×2): 1300 [IU]/h via INTRAVENOUS
  Administered 2016-07-01 – 2016-07-03 (×3): 1600 [IU]/h via INTRAVENOUS
  Filled 2016-06-26 (×16): qty 250

## 2016-06-26 MED ORDER — SODIUM CHLORIDE 0.9 % IV SOLN
INTRAVENOUS | Status: DC
  Administered 2016-06-26: 07:00:00 via INTRAVENOUS

## 2016-06-26 MED ORDER — LIDOCAINE HCL (PF) 1 % IJ SOLN
INTRAMUSCULAR | Status: AC
  Filled 2016-06-26: qty 30

## 2016-06-26 MED ORDER — HEPARIN SODIUM (PORCINE) 1000 UNIT/ML IJ SOLN
INTRAMUSCULAR | Status: AC
  Filled 2016-06-26: qty 1

## 2016-06-26 SURGICAL SUPPLY — 25 items
BALLN LUTONIX 5X150X130 (BALLOONS) ×15
BALLN LUTONIX DCB 4X100X130 (BALLOONS) ×3
BALLN ULTRASCORE 4X150X130 (BALLOONS) ×3
BALLN ULTRASCORE 5X150X130 (BALLOONS) ×2
BALLN ULTRVRSE 3X100X130C (BALLOONS) ×3
BALLOON LUTONIX 5X150X130 (BALLOONS) ×5 IMPLANT
BALLOON LUTONIX DCB 4X100X130 (BALLOONS) ×1 IMPLANT
BALLOON ULTRASCORE 4X150X130 (BALLOONS) ×1 IMPLANT
BALLOON ULTRASCORE 5X150X130 (BALLOONS) ×1 IMPLANT
BALLOON ULTRVRSE 3X100X130C (BALLOONS) ×1 IMPLANT
CATH PIG 70CM (CATHETERS) ×3 IMPLANT
DEVICE PRESTO INFLATION (MISCELLANEOUS) ×3 IMPLANT
DEVICE STARCLOSE SE CLOSURE (Vascular Products) ×3 IMPLANT
DEVICE TORQUE (MISCELLANEOUS) ×3 IMPLANT
GLIDECATH ANGLED 4FR 120CM (CATHETERS) ×3 IMPLANT
GLIDEWIRE ANGLED SS 035X260CM (WIRE) ×3 IMPLANT
NEEDLE ENTRY 21GA 7CM ECHOTIP (NEEDLE) ×3 IMPLANT
PACK ANGIOGRAPHY (CUSTOM PROCEDURE TRAY) ×3 IMPLANT
SET INTRO CAPELLA COAXIAL (SET/KITS/TRAYS/PACK) ×3 IMPLANT
SHEATH ANL2 6FRX45 HC (SHEATH) ×3 IMPLANT
SHEATH BRITE TIP 5FRX11 (SHEATH) ×3 IMPLANT
VALVE HEMO TOUHY BORST Y (VALVE) ×3 IMPLANT
WIRE G V18X300CM (WIRE) ×3 IMPLANT
WIRE HI TORQ VERSACORE 300 (WIRE) ×3 IMPLANT
WIRE J 3MM .035X145CM (WIRE) ×3 IMPLANT

## 2016-06-26 NOTE — Progress Notes (Signed)
Inpatient Diabetes Program Recommendations  AACE/ADA: New Consensus Statement on Inpatient Glycemic Control (2015)  Target Ranges:  Prepandial:   less than 140 mg/dL      Peak postprandial:   less than 180 mg/dL (1-2 hours)      Critically ill patients:  140 - 180 mg/dL   Lab Results  Component Value Date   GLUCAP 80 06/26/2016    Review of Glycemic Control  Results for GOVANNI, PLEMONS (MRN [id number]) as of 06/26/2016 09:02  Ref. Range 06/24/2016 21:09 06/25/2016 07:40 06/25/2016 16:33 06/25/2016 21:25 06/26/2016 06:36  Glucose-Capillary Latest Ref Range: 65 - 99 mg/dL 865 (H) 77 784 (H) 696 (H) 80   Diabetes history: Type 2 diabetes Outpatient Diabetes medications: Glucotrol  bid, Lantus 25 units qhs, R insulin 0-10 units tid with meals  Current orders for Inpatient glycemic control: Lantus 25 units qhs,  Novolog 0-9 units tid, Novolog 0-5 units qhs  Inpatient Diabetes Program Recommendations:  Please decrease Lantus insulin to 19 units qhs  Susette Racer, RN, [place], [place], CDE Diabetes Coordinator Inpatient Diabetes Program  [phone] (Team Pager) [phone] [facility] Office) 06/26/2016 9:04 AM

## 2016-06-26 NOTE — Progress Notes (Signed)
[employer] - [place] [facility]   PATIENT NAME: Eddie Myers    MR#:  [id number]  DATE OF BIRTH:  1955/06/29  SUBJECTIVE:  CHIEF COMPLAINT:  No chief complaint on file.  Still complains of severe pain in both his feet  REVIEW OF SYSTEMS:    Review of Systems  Constitutional: Positive for malaise/fatigue. Negative for chills and fever.  HENT: Negative for sore throat.   Eyes: Negative for blurred vision, double vision and pain.  Respiratory: Negative for cough, hemoptysis, shortness of breath and wheezing.   Cardiovascular: Negative for chest pain, palpitations, orthopnea and leg swelling.  Gastrointestinal: Negative for abdominal pain, constipation, diarrhea, heartburn, nausea and vomiting.  Genitourinary: Negative for dysuria and hematuria.  Musculoskeletal: Negative for back pain and joint pain.  Skin: Negative for rash.  Neurological: Positive for weakness. Negative for sensory change, speech change, focal weakness and headaches.  Endo/Heme/Allergies: Does not bruise/bleed easily.  Psychiatric/Behavioral: Negative for depression. The patient is not nervous/anxious.     DRUG ALLERGIES:  No Known Allergies  VITALS:  Blood pressure 137/63, pulse 84, temperature 98.1 F (36.7 C), temperature source Oral, resp. rate 17, height  (1.727 m), weight 75 kg (165 lb 5.5 oz), SpO2 96 %.  PHYSICAL EXAMINATION:   Physical Exam  GENERAL:  61 y.o.-year-old patient lying in the bed with no acute distress.  EYES: Pupils equal, round, reactive to light and accommodation. No scleral icterus. Extraocular muscles intact.  HEENT: Head atraumatic, normocephalic. Oropharynx and nasopharynx clear.  NECK:  Supple, no jugular venous distention. No thyroid enlargement, no tenderness.  LUNGS: Normal breath sounds bilaterally, no wheezing, rales, rhonchi. No use of accessory muscles of respiration.  CARDIOVASCULAR: S1, S2 normal. No murmurs, rubs, or gallops.  ABDOMEN: Soft,  nontender, nondistended. Bowel sounds present. No organomegaly or mass.  EXTREMITIES: Toes bilaterally are discolored with dry gangrene.  NEUROLOGIC: Cranial nerves II through XII are intact. No focal Motor or sensory deficits b/l.   PSYCHIATRIC: The patient is alert and oriented x 3.  SKIN: No obvious rash, lesion, or ulcer.   LABORATORY PANEL:   CBC  Recent Labs Lab 06/25/16 0434  WBC 6.7  HGB 10.1*  HCT 29.1*  PLT 265   ------------------------------------------------------------------------------------------------------------------ Chemistries   Recent Labs Lab 06/24/16 1524 06/25/16 0434  NA 132* 133*  K 4.0 4.2  CL 94* 95*  CO2 28 28  GLUCOSE 61* 110*  BUN 26* 37*  CREATININE 6.66* 7.88*  CALCIUM 8.3* 8.0*  AST 15  --   ALT 25  --   ALKPHOS 75  --   BILITOT 0.6  --    ------------------------------------------------------------------------------------------------------------------  Cardiac Enzymes No results for input(s): TROPONINI in the last 168 hours. ------------------------------------------------------------------------------------------------------------------  RADIOLOGY:  Ct Head Wo Contrast  Result Date: 06/24/2016 CLINICAL DATA:  Episode of visual loss in RIGHT eye 3-4 weeks, still with some visual deficits, history of hypertension, diabetes EXAM: CT HEAD WITHOUT CONTRAST TECHNIQUE: Contiguous axial images were obtained from the base of the skull through the vertex without intravenous contrast. COMPARISON:  None. FINDINGS: Brain: Generalized atrophy. Normal ventricular morphology. No midline shift or mass effect. Small vessel chronic ischemic changes of deep cerebral white matter. Small lacunar infarct in RIGHT thalamus, age-indeterminate. No intracranial hemorrhage, mass lesion or evidence of cortical infarction. No extra-axial fluid collections. Vascular: Atherosclerotic calcifications throughout the carotid siphons. Skull: Intact Sinuses/Orbits: Clear  Other: N/A IMPRESSION: Atrophy with small vessel chronic ischemic changes of deep cerebral white matter. Age-indeterminate lacunar  infarct in the RIGHT thalamus. No evidence of cortical infarction or intracranial hemorrhage. Electronically Signed   By: Ulyses Southward M.D.   On: 06/24/2016 15:47     ASSESSMENT AND PLAN:   Eddie Myers  is a 61 y.o. male with a known history of End-stage renal disease on hemodialysis, diabetes, hypertension, hyperlipidemia, history of previous CVA, history of coronary artery disease status post bypass who presented to the hospital from his cardiologist's office due to bilateral lower extremity necrotic toes redness and pain.  * Bilateral lower extremity cyanosis with necrotic toes Pain control with IV and oral pain medications Status post left lower extremity angiogram with angioplasty of left superficial femoral, popliteal and peroneal arteries. Right lower extremity angiogram on Tuesday.   * Subacute right sided vision loss-patient had symptoms about a month ago.  -CT head shows a age-indeterminate right sided thalamic CVA, I will get an MRI of the brain. Appreciate Neurology input  * Paroxysmal atrial fibrillation on Coumadin. INR is 1.4. We'll start heparin drip and restart Coumadin after his angiogram on Tuesday.  * End-stage renal disease on hemodialysis-patient gets dialysis on Tuesday Thursday Saturday Getting dialysis today  * Essential hypertension-continue carvedilol, lisinopril, Norvasc.  * Diabetes type 2 without complication-continue Lantus, sliding scale insulin.  * Hyperlipidemia-continue atorvastatin.  All the records are reviewed and case discussed with Care Management/Social Worker. Management plans discussed with the patient, family and they are in agreement.  CODE STATUS: FULL CODE  DVT Prophylaxis: SCDs  TOTAL TIME TAKING CARE OF THIS PATIENT: 35 minutes.   POSSIBLE D/C IN 2-3 DAYS, DEPENDING ON CLINICAL  CONDITION.  Milagros Loll R M.D on 06/26/2016 at 1:45 PM  Between 7am to 6pm - Pager - [phone]  After 6pm go to [email or web address] - password [email or web address]  [facility] [place] [facility]  Office  [phone]  CC: Primary care physician; Pcp Not In System  Note: This dictation was prepared with Dragon dictation along with smaller phrase technology. Any transcriptional errors that result from this process are unintentional.

## 2016-06-26 NOTE — Progress Notes (Signed)
[facility]  ROUNDING NOTE   Subjective:  Patient status post vascular intervention of the left lower extremity. Patient completed hemodialysis yesterday and will be due for dialysis again tomorrow. He will need additional intervention on the right lower extremity next week.   Objective:  Vital signs in last 24 hours:  Temp:  [98 F (36.7 C)-98.9 F (37.2 C)] 98.1 F (36.7 C) (04/27 0713) Pulse Rate:  [75-120] 84 (04/27 1053) Resp:  [9-18] 17 (04/27 1053) BP: (108-174)/(49-95) 137/63 (04/27 1053) SpO2:  [93 %-100 %] 96 % (04/27 1053) FiO2 (%):  [94 %-98 %] 98 % (04/27 1015) Weight:  [75 kg (165 lb 5.5 oz)-75.1 kg (165 lb 9.1 oz)] 75 kg (165 lb 5.5 oz) (04/27 0713)  Weight change: 1.249 kg (2 lb 12.1 oz) Filed Weights   06/25/16 1105 06/25/16 1445 06/26/16 0713  Weight: 77 kg (169 lb 12.1 oz) 75.1 kg (165 lb 9.1 oz) 75 kg (165 lb 5.5 oz)    Intake/Output: I/O last 3 completed shifts: In: 480 [P.O.:480] Out: 2000 [Other:2000]   Intake/Output this shift:  No intake/output data recorded.  Physical Exam: General: No acute distress  Head: Normocephalic, atraumatic. Moist oral mucosal membranes  Eyes: Anicteric  Neck: Supple, trachea midline  Lungs:  Clear to auscultation, normal effort  Heart: S1S2 no rubs  Abdomen:  Soft, nontender, BS present  Extremities: Trace peripheral edema. Gangrene left 1st toe, right 2nd toe  Neurologic: Nonfocal, moving all four extremities  Skin: No lesions  Access: LUE AV access    Basic Metabolic Panel:  Recent Labs Lab 06/24/16 1524 06/25/16 0434 06/25/16 0930  NA 132* 133*  --   K 4.0 4.2  --   CL 94* 95*  --   CO2 28 28  --   GLUCOSE 61* 110*  --   BUN 26* 37*  --   CREATININE 6.66* 7.88*  --   CALCIUM 8.3* 8.0*  --   PHOS  --   --  4.6    Liver Function Tests:  Recent Labs Lab 06/24/16 1524  AST 15  ALT 25  ALKPHOS 75  BILITOT 0.6  PROT 8.0  ALBUMIN 3.2*   No results for input(s): LIPASE,  AMYLASE in the last 168 hours. No results for input(s): AMMONIA in the last 168 hours.  CBC:  Recent Labs Lab 06/24/16 1524 06/25/16 0434  WBC 10.4 6.7  HGB 9.9* 10.1*  HCT 29.0* 29.1*  MCV 85.8 86.2  PLT 324 265    Cardiac Enzymes: No results for input(s): CKTOTAL, CKMB, CKMBINDEX, TROPONINI in the last 168 hours.  BNP: Invalid input(s): POCBNP  CBG:  Recent Labs Lab 06/25/16 1633 06/25/16 2125 06/26/16 0636 06/26/16 1120 06/26/16 1237  GLUCAP 149* 189* 80 66 123*    Microbiology: Results for orders placed or performed during the hospital encounter of 06/24/16  MRSA PCR Screening     Status: None   Collection Time: 06/24/16  5:05 AM  Result Value Ref Range Status   MRSA by PCR NEGATIVE NEGATIVE Final    Comment:        The GeneXpert MRSA Assay (FDA approved for NASAL specimens only), is one component of a comprehensive MRSA colonization surveillance program. It is not intended to diagnose MRSA infection nor to guide or monitor treatment for MRSA infections.     Coagulation Studies:  Recent Labs  06/24/16 1133 06/24/16 1524 06/25/16 0434 06/26/16 0444  LABPROT  --  36.2* 38.6* 17.8*  INR 4.9 3.53  3.82 1.45    Urinalysis: No results for input(s): COLORURINE, LABSPEC, PHURINE, GLUCOSEU, HGBUR, BILIRUBINUR, KETONESUR, PROTEINUR, UROBILINOGEN, NITRITE, LEUKOCYTESUR in the last 72 hours.  Invalid input(s): APPERANCEUR    Imaging: Ct Head Wo Contrast  Result Date: 06/24/2016 CLINICAL DATA:  Episode of visual loss in RIGHT eye 3-4 weeks, still with some visual deficits, history of hypertension, diabetes EXAM: CT HEAD WITHOUT CONTRAST TECHNIQUE: Contiguous axial images were obtained from the base of the skull through the vertex without intravenous contrast. COMPARISON:  None. FINDINGS: Brain: Generalized atrophy. Normal ventricular morphology. No midline shift or mass effect. Small vessel chronic ischemic changes of deep cerebral white matter. Small  lacunar infarct in RIGHT thalamus, age-indeterminate. No intracranial hemorrhage, mass lesion or evidence of cortical infarction. No extra-axial fluid collections. Vascular: Atherosclerotic calcifications throughout the carotid siphons. Skull: Intact Sinuses/Orbits: Clear Other: N/A IMPRESSION: Atrophy with small vessel chronic ischemic changes of deep cerebral white matter. 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 No evidence of cortical infarction or intracranial hemorrhage. Electronically Signed   By: Ulyses Southward M.D.   On: 06/24/2016 15:47     Medications:   . sodium chloride    . sodium chloride    . sodium chloride 20 mL/hr at 06/26/16 0726   . amLODipine  2.5 mg Oral Daily  . aspirin EC  81 mg Oral Daily  . atorvastatin  80 mg Oral QPM  . carvedilol  25 mg Oral BID WC  . [START ON 06/27/2016] clopidogrel  75 mg Oral Daily  . ferric citrate  420 mg Oral TID WC  . insulin aspart  0-5 Units Subcutaneous QHS  . insulin aspart  0-9 Units Subcutaneous TID WC  . insulin glargine  22 Units Subcutaneous QHS  . lisinopril  2.5 mg Oral Daily  . living well with diabetes book   Does not apply Once  . Melatonin  5 mg Oral QHS  . oxyCODONE      . pneumococcal 23 valent vaccine  0.5 mL Intramuscular Tomorrow-1000  . tamsulosin  0.4 mg Oral QHS   sodium chloride, sodium chloride, acetaminophen **OR** acetaminophen, alteplase, heparin, lidocaine (PF), lidocaine-prilocaine, morphine injection, nitroGLYCERIN, ondansetron **OR** ondansetron (ZOFRAN) IV, oxyCODONE, pentafluoroprop-tetrafluoroeth  Assessment/ Plan:  61 y.o. male ESRD on HD TTS, coronary artery disease, diabetes mellitus type 2, hyperlipidemia, hypertension, history of CVA, anemia chronic kidney disease, secondary hyperparathyroidism who presents with gangrenous changes on toes of both feet.  1.  ESRD on HD TTS: patient completed hemodialysis yesterday.  No acute indication for dialysis today.  We will plan for dialysis  again tomorrow.  2.  Anemia chronic kidney disease.  Start Epogen 4000 units IV with dialysis.  3.  Secondary hyperparathyroidism.  Phosphorus 4.6.  Continue ferric citrate  po tid/wm.   4.  Hypertension. Blood pressure currently well-controlled at 137/63.  Continue amlodipine,carvedilol, and lisinopril.  5.   Peripheral arterial disease:  Has vascular disease in both lower extremities.  He is status post angioplasty of blood vessels in the left lower extremity.   LOS: 2 Laureen Frederic 4/27/201812:40 PM

## 2016-06-26 NOTE — OR Nursing (Signed)
Left great toe black ; Left 2nd & 3rd toes darkened (bluish/black ) with open wounds mid toe non-draining.   Right 2nd toe necrotic with red line extending from toe to mid dorsal foot.

## 2016-06-26 NOTE — H&P (Signed)
[facility] History & Physical Update  The patient was interviewed and re-examined.  The patient's previous History and Physical has been reviewed and is unchanged.  There is no change in the plan of care. We plan to proceed with the scheduled procedure.  Levora Dredge, MD  06/26/2016, 8:21 AM

## 2016-06-26 NOTE — OR Nursing (Signed)
Dr Gilda Crease out to speak with patient with questions answered with interpreter present.

## 2016-06-26 NOTE — Progress Notes (Signed)
[facility] [place] [facility]   PATIENT NAME: Eddie Myers    MR#:  [id number]  DATE OF BIRTH:  Jan 09, 1956  SUBJECTIVE:  CHIEF COMPLAINT:  No chief complaint on file.  Still complains of severe pain in both his feet  REVIEW OF SYSTEMS:    Review of Systems  Constitutional: Positive for malaise/fatigue. Negative for chills and fever.  HENT: Negative for sore throat.   Eyes: Negative for blurred vision, double vision and pain.  Respiratory: Negative for cough, hemoptysis, shortness of breath and wheezing.   Cardiovascular: Negative for chest pain, palpitations, orthopnea and leg swelling.  Gastrointestinal: Negative for abdominal pain, constipation, diarrhea, heartburn, nausea and vomiting.  Genitourinary: Negative for dysuria and hematuria.  Musculoskeletal: Negative for back pain and joint pain.  Skin: Negative for rash.  Neurological: Positive for weakness. Negative for sensory change, speech change, focal weakness and headaches.  Endo/Heme/Allergies: Does not bruise/bleed easily.  Psychiatric/Behavioral: Negative for depression. The patient is not nervous/anxious.     DRUG ALLERGIES:  No Known Allergies  VITALS:  Blood pressure 137/63, pulse 84, temperature 98.1 F (36.7 C), temperature source Oral, resp. rate 17, height  (1.727 m), weight 75 kg (165 lb 5.5 oz), SpO2 96 %.  PHYSICAL EXAMINATION:   Physical Exam  GENERAL:  61 y.o.-year-old patient lying in the bed with no acute distress.  EYES: Pupils equal, round, reactive to light and accommodation. No scleral icterus. Extraocular muscles intact.  HEENT: Head atraumatic, normocephalic. Oropharynx and nasopharynx clear.  NECK:  Supple, no jugular venous distention. No thyroid enlargement, no tenderness.  LUNGS: Normal breath sounds bilaterally, no wheezing, rales, rhonchi. No use of accessory muscles of respiration.  CARDIOVASCULAR: S1, S2 normal. No murmurs, rubs, or gallops.  ABDOMEN: Soft,  nontender, nondistended. Bowel sounds present. No organomegaly or mass.  EXTREMITIES: Toes bilaterally are discolored with dry gangrene.  NEUROLOGIC: Cranial nerves II through XII are intact. No focal Motor or sensory deficits b/l.   PSYCHIATRIC: The patient is alert and oriented x 3.  SKIN: No obvious rash, lesion, or ulcer.   LABORATORY PANEL:   CBC  Recent Labs Lab 06/25/16 0434  WBC 6.7  HGB 10.1*  HCT 29.1*  PLT 265   ------------------------------------------------------------------------------------------------------------------ Chemistries   Recent Labs Lab 06/24/16 1524 06/25/16 0434  NA 132* 133*  K 4.0 4.2  CL 94* 95*  CO2 28 28  GLUCOSE 61* 110*  BUN 26* 37*  CREATININE 6.66* 7.88*  CALCIUM 8.3* 8.0*  AST 15  --   ALT 25  --   ALKPHOS 75  --   BILITOT 0.6  --    ------------------------------------------------------------------------------------------------------------------  Cardiac Enzymes No results for input(s): TROPONINI in the last 168 hours. ------------------------------------------------------------------------------------------------------------------  RADIOLOGY:  Ct Head Wo Contrast  Result Date: 06/24/2016 CLINICAL DATA:  Episode of visual loss in RIGHT eye 3-4 weeks, still with some visual deficits, history of hypertension, diabetes EXAM: CT HEAD WITHOUT CONTRAST TECHNIQUE: Contiguous axial images were obtained from the base of the skull through the vertex without intravenous contrast. COMPARISON:  None. FINDINGS: Brain: Generalized atrophy. Normal ventricular morphology. No midline shift or mass effect. Small vessel chronic ischemic changes of deep cerebral white matter. Small lacunar infarct in RIGHT thalamus, age-indeterminate. No intracranial hemorrhage, mass lesion or evidence of cortical infarction. No extra-axial fluid collections. Vascular: Atherosclerotic calcifications throughout the carotid siphons. Skull: Intact Sinuses/Orbits: Clear  Other: N/A IMPRESSION: Atrophy with small vessel chronic ischemic changes of deep cerebral white matter. Age-indeterminate lacunar  infarct in the RIGHT thalamus. No evidence of cortical infarction or intracranial hemorrhage. Electronically Signed   By: Ulyses Southward M.D.   On: 06/24/2016 15:47     ASSESSMENT AND PLAN:   Eddie Myers  is a 61 y.o. male with a known history of End-stage renal disease on hemodialysis, diabetes, hypertension, hyperlipidemia, history of previous CVA, history of coronary artery disease status post bypass who presented to the hospital from his cardiologist's office due to bilateral lower extremity necrotic toes redness and pain.  * Bilateral lower extremity cyanosis with necrotic toes Pain control with IV and oral pain medications Angiogram planned for tomorrow. We'll give vitamin K to reverse INR.  * Subacute right sided vision loss-patient had symptoms about a month ago.  -CT head shows a age-indeterminate right sided thalamic CVA, I will get an MRI of the brain. Appreciate Neurology input  * End-stage renal disease on hemodialysis-patient gets dialysis on Tuesday Thursday Saturday Getting dialysis today  * Essential hypertension-continue carvedilol, lisinopril, Norvasc.  * Diabetes type 2 without complication-continue Lantus, sliding scale insulin.  * Hyperlipidemia-continue atorvastatin.  All the records are reviewed and case discussed with Care Management/Social Worker. Management plans discussed with the patient, family and they are in agreement.  CODE STATUS: FULL CODE  DVT Prophylaxis: SCDs  TOTAL TIME TAKING CARE OF THIS PATIENT: 35 minutes.   POSSIBLE D/C IN 2-3 DAYS, DEPENDING ON CLINICAL CONDITION.  Milagros Loll R M.D on 06/26/2016 at 1:38 PM  Between 7am to 6pm - Pager - [phone]  After 6pm go to [email or web address] - password [email or web address]  [facility]  Office  [phone]  CC: Primary care physician; Pcp Not In  System  Note: This dictation was prepared with Dragon dictation along with smaller phrase technology. Any transcriptional errors that result from this process are unintentional.

## 2016-06-26 NOTE — Progress Notes (Signed)
ANTICOAGULATION CONSULT NOTE - Initial Consult  Pharmacy Consult for heparin Indication: atrial fibrillation  No Known Allergies  Patient Measurements: Height:  (172.7 cm) Weight: 165 lb 5.5 oz (75 kg) IBW/kg (Calculated) : 68.4 Heparin Dosing Weight:   Vital Signs: Temp: 98.1 F (36.7 C) (04/27 0713) Temp Source: Oral (04/27 0713) BP: 137/63 (04/27 1053) Pulse Rate: 84 (04/27 1053)  Labs:  Recent Labs  06/24/16 1524 06/25/16 0434 06/26/16 0444  HGB 9.9* 10.1*  --   HCT 29.0* 29.1*  --   PLT 324 265  --   LABPROT 36.2* 38.6* 17.8*  INR 3.53 3.82 1.45  CREATININE 6.66* 7.88*  --     Estimated Creatinine Clearance: 9.4 mL/min (A) (by C-G formula based on SCr of 7.88 mg/dL (H)).   Medical History: Past Medical History:  Diagnosis Date  . Anginal pain (HCC)   . Coronary artery disease   . Diabetes mellitus without complication (HCC)   . Dialysis patient (HCC)    Tues, Thurs, Sat  . Dyspnea    with exertion  . Elevated lipids   . Enlarged prostate   . ESRD (end stage renal disease) (HCC)    dialysis M-W-F  . Hypertension   . Myocardial infarction (HCC) 08/2015  . Stroke [facility])    Lt side this year July    Medications:  Infusions:  . sodium chloride    . sodium chloride    . heparin      Assessment: 61 yom with severe pain in both feet. Planning for angiogram on Tuesday. Patient takes VKA at home for AF. Pharmacy consulted to dose heparin for AF bridge perioperatively. Patient received heparin subcutaneously once this morning at 08:48. Defer initial bolus.   Goal of Therapy:  Heparin level 0.3-0.7 units/ml Monitor platelets by anticoagulation protocol: Yes   Plan:  Start heparin infusion at 1050 units/hr Check anti-Xa level in 8 hours and daily while on heparin Continue to monitor H&H and platelets  Carola Frost, Pharm.D., BCPS Clinical Pharmacist 06/26/2016,1:53 PM

## 2016-06-26 NOTE — Op Note (Signed)
[facility] Percutaneous Study/Intervention Procedural Note   Date of Surgery: 06/26/2016  Surgeon: Hortencia Pilar  Pre-operative Diagnosis: Atherosclerotic occlusive disease bilateral lower extremities with bilateral lower extremity gangrene more extensive on the left compared to the right  Post-operative diagnosis: Same  Procedure(s) Performed: 1. Introduction catheter into left lower extremity 3rd order catheter placement  2. Contrast injection left lower extremity for distal runoff  3. Percutaneous transluminal angioplasty left superficial femoral and popliteal arteries to 5 mm with Lutonix drug eluting balloons 4. Percutaneous transluminal angioplasty of the left peroneal artery to 3 mm  5. Star close closure right common femoral arteriotomy  Anesthesia: Conscious sedation was administered under my direct supervision by the interventional radiology RN. IV Versed plus fentanyl were utilized. Continuous ECG, pulse oximetry and blood pressure was monitored throughout the entire procedure.  Conscious sedation was for a total of 1 hour 17 minutes.  Sheath: 6 [place] Ansell  Contrast: 88 cc  Fluoroscopy Time: 11.0 minutes  Indications: Eddie Myers presents with increasing pain of the bilateral lower extremity. There is gangrene noted of the left first second and third toes and the right second toe. This suggests the patient is having limb threatening ischemia. The risks and benefits are reviewed all questions answered patient agrees to proceed.  Procedure:Eddie Myers is a 61 y.o. y.o. male who was identified and appropriate procedural time out was performed. The patient was then placed supine on the table and prepped and draped in the usual sterile fashion.   Ultrasound was placed in the sterile sleeve and the right groin was evaluated the right common femoral artery was echolucent and  pulsatile indicating patency. Image was recorded for the permanent record and under real-time visualization a microneedle was inserted into the common femoral artery followed by the microwire and then the micro-sheath. A J-wire was then advanced through the micro-sheath and a 5 [place] sheath was then inserted over a J-wire. J-wire was then advanced and a 5 French pigtail catheter was positioned at the level of T12.  AP projection of the aorta was then obtained. Pigtail catheter was repositioned to above the bifurcation and a RAO view of the pelvis was obtained. Subsequently a pigtail catheter with the stiff angle Glidewire was used to cross the aortic bifurcation the catheter wire were advanced down into the left distal external iliac artery. Oblique view of the femoral bifurcation was then obtained and subsequently the wire was reintroduced and the pigtail catheter negotiated into the SFA representing third order catheter placement. Distal runoff was then performed.  5000 units of heparin was then given and allowed to circulate and a 6 [place] Ansell sheath was advanced up and over the bifurcation and positioned in the femoral artery  Straight catheter and stiff angle Glidewire were then negotiated down into the distal popliteal. Catheter was then advanced. Hand injection contrast demonstrated the tibial anatomy in detail.  A 3 mm x 10 cm all traverse balloon was used to angioplasty the proximal one third of the peroneal artery and the tibioperoneal trunk. Each inflation was for 2 minutes at 12 atm. Follow-up imaging demonstrated excellent patency with less than 10% residual stenosis and preservation of the distal runoff.  The detector was then repositioned and the SFA and popliteal was reimaged approximately a half a dozen greater than 70% stenosis are noted in this area and after appropriate measurements are made a 4 mm x 10 centimeter ultrascore balloon was positioned in the distal popliteal and  inflated to  12 atm for 1 minute. A 5 millimeter x 15 centimeter ultra score balloon was then positioned beginning at the level of the femoral condyles and angioplasty of the SFA was performed moving proximally. 3 inflations were required each inflation was to 12 atm for 1 minute. Following this, a 4 mm x 10 cm Lutonix balloon was used to angioplasty the popliteal artery and then through the 5 mm x 15 cm Lutonix drug-eluting balloons were used to angioplasty the superficial femoral artery. Inflations were to 12 atmospheres for 2 minutes. Follow-up imaging demonstrated patency with excellent result. Distal runoff was then reassessed and noted to be widely patent.   After review of these images the sheath is pulled into the right external iliac oblique of the common femoral is obtained and a Star close device deployed. There no immediate Complications.  Findings: The abdominal aorta is opacified with a bolus injection contrast. Renal arteries are nonvisualized consistent with end-stage renal disease. The aorta itself has diffuse disease but no hemodynamically significant lesions. The common and external iliac arteries are widely patent bilaterally.  The left common femoral is widely patent but the profunda femoris demonstrates diffuse disease and collateralizes poorly with the popliteal artery.  The SFA does indeed have a significant stenosis as described above there proximally half a dozen lesions throughout the SFA and popliteal of greater than 70% stenosis.  The distal popliteal demonstrates increasing disease and the trifurcation is heavily diseased with occlusion of the anterior tibial and posterior tibial throughout their course. The peroneal demonstrates a subtotal occlusion in its proximal portion.  Following angioplasty to 3 mm of the peroneal now is in-line flow and looks quite nice with less than 5% residual stenosis. Angioplasty of the SFA and popliteal at Hunter's canal with Lutonix drug-eluting  balloons yields an excellent result with less than 10% residual stenosis.  Summary: Successful recanalization left lower extremity for limb salvage   Disposition: Patient was taken to the recovery room in stable condition having tolerated the procedure well.  Eddie Myers 06/26/2016,10:47 AM

## 2016-06-27 LAB — CBC
HCT: 27.3 % — ABNORMAL LOW (ref 40.0–52.0)
Hemoglobin: 9.3 g/dL — ABNORMAL LOW (ref 13.0–18.0)
MCH: 30 pg (ref 26.0–34.0)
MCHC: 34.1 g/dL (ref 32.0–36.0)
MCV: 87.8 fL (ref 80.0–100.0)
PLATELETS: 211 10*3/uL (ref 150–440)
RBC: 3.11 MIL/uL — ABNORMAL LOW (ref 4.40–5.90)
RDW: 14.6 % — AB (ref 11.5–14.5)
WBC: 7 10*3/uL (ref 3.8–10.6)

## 2016-06-27 LAB — GLUCOSE, CAPILLARY
GLUCOSE-CAPILLARY: 182 mg/dL — AB (ref 65–99)
Glucose-Capillary: 90 mg/dL (ref 65–99)
Glucose-Capillary: 152 mg/dL — ABNORMAL HIGH (ref 65–99)

## 2016-06-27 LAB — HEPARIN LEVEL (UNFRACTIONATED)
Heparin Unfractionated: 0.36 IU/mL (ref 0.30–0.70)
Heparin Unfractionated: 0.39 IU/mL (ref 0.30–0.70)

## 2016-06-27 LAB — PHOSPHORUS: Phosphorus: 5.3 mg/dL — ABNORMAL HIGH (ref 2.5–4.6)

## 2016-06-27 MED ORDER — SODIUM CHLORIDE 0.9 % IV SOLN
750.0000 mg | INTRAVENOUS | Status: DC
  Administered 2016-06-27: 750 mg via INTRAVENOUS
  Filled 2016-06-27 (×4): qty 750

## 2016-06-27 MED ORDER — VANCOMYCIN HCL IN DEXTROSE 750-5 MG/150ML-% IV SOLN
750.0000 mg | INTRAVENOUS | Status: DC
  Filled 2016-06-27: qty 150

## 2016-06-27 MED ORDER — VANCOMYCIN HCL 10 G IV SOLR
1750.0000 mg | Freq: Once | INTRAVENOUS | Status: AC
  Administered 2016-06-27: 1750 mg via INTRAVENOUS
  Filled 2016-06-27: qty 1750

## 2016-06-27 MED ORDER — HEPARIN BOLUS VIA INFUSION
2250.0000 [IU] | Freq: Once | INTRAVENOUS | Status: AC
  Administered 2016-06-27: 2250 [IU] via INTRAVENOUS
  Filled 2016-06-27: qty 2250

## 2016-06-27 NOTE — Progress Notes (Signed)
Pre hd info 

## 2016-06-27 NOTE — Progress Notes (Signed)
  End of hd 

## 2016-06-27 NOTE — Progress Notes (Signed)
ANTICOAGULATION CONSULT NOTE - Initial Consult  Pharmacy Consult for heparin Indication: atrial fibrillation  No Known Allergies  Patient Measurements: Height:  (172.7 cm) Weight: 165 lb 5.5 oz (75 kg) IBW/kg (Calculated) : 68.4 Heparin Dosing Weight:   Vital Signs: Temp: 98.1 F (36.7 C) (04/27 0713) Temp Source: Oral (04/27 0713) BP: 137/63 (04/27 1053) Pulse Rate: 84 (04/27 1053)  Labs:  Recent Labs (last 2 labs)    Recent Labs  06/24/16 1524 06/25/16 0434 06/26/16 0444  HGB 9.9* 10.1*  --   HCT 29.0* 29.1*  --   PLT 324 265  --   LABPROT 36.2* 38.6* 17.8*  INR 3.53 3.82 1.45  CREATININE 6.66* 7.88*  --       Estimated Creatinine Clearance: 9.4 mL/min (A) (by C-G formula based on SCr of 7.88 mg/dL (H)).    Assessment: 61 yom with severe pain in both feet. Planning for angiogram on Tuesday. Patient takes VKA at home for AF. Pharmacy consulted to dose heparin for AF bridge perioperatively. Patient received heparin subcutaneously once this morning at 08:48. Defer initial bolus.   Goal of Therapy:  Heparin level 0.3-0.7 units/ml Monitor platelets by anticoagulation protocol: Yes   Plan:  Current orders for heparin 1200 units/hr. Heparin level therapeutic x 1, will check confirmatory level in 8 hours. Daily CBC per protocol.  Garlon Hatchet, PharmD, BCPS Clinical Pharmacist  06/27/2016 10:11 AM

## 2016-06-27 NOTE — Progress Notes (Signed)
Pharmacy Antibiotic Note  Eddie Myers is a 61 y.o. male with a h/o ESRD on HD admitted on 06/24/2016 with bilateral LE necrotic toes.  Pharmacy has been consulted for vancomyin dosing.  Plan: Vancomycin 1750 mg iv once then 750 mg iv q HD. Pre-HD vancomycin level with the third dialysis session. Goal level 15-25 mcg/ml.   Height:  (172.7 cm) Weight: 165 lb 5.5 oz (75 kg) IBW/kg (Calculated) : 68.4  Temp (24hrs), Avg:101.9 F (38.8 C), Min:100.5 F (38.1 C), Max:102.9 F (39.4 C)   Recent Labs Lab 06/24/16 1524 06/25/16 0434  WBC 10.4 6.7  CREATININE 6.66* 7.88*    Estimated Creatinine Clearance: 9.4 mL/min (A) (by C-G formula based on SCr of 7.88 mg/dL (H)).    No Known Allergies  Antimicrobials this admission: cefazolin 4/27 x 1 vancomycin 4/28 >>   Dose adjustments this admission:   Microbiology results: 4/28 BCx: NGTD 4/25 MRSA PCR: negative  Thank you for allowing pharmacy to be a part of this patient's care.  Luisa Hart D 06/27/2016 11:39 AM

## 2016-06-27 NOTE — Progress Notes (Signed)
[facility]  ROUNDING NOTE   Subjective:  Patient seen at bedside. Denies having pain in his left foot but does have some pain in his right foot. He is due for hemodialysis today.   Objective:  Vital signs in last 24 hours:  Temp:  [100.5 F (38.1 C)-102.9 F (39.4 C)] 102.3 F (39.1 C) (04/28 0656) Pulse Rate:  [80-100] 93 (04/28 0519) Resp:  [9-20] 18 (04/28 0519) BP: (122-145)/(53-70) 125/53 (04/28 0519) SpO2:  [93 %-99 %] 94 % (04/28 0519) FiO2 (%):  [94 %-98 %] 98 % (04/27 1015)  Weight change: -2 kg (-4 lb 6.6 oz) Filed Weights   06/25/16 1105 06/25/16 1445 06/26/16 0713  Weight: 77 kg (169 lb 12.1 oz) 75.1 kg (165 lb 9.1 oz) 75 kg (165 lb 5.5 oz)    Intake/Output: I/O last 3 completed shifts: In: 258.8 [P.O.:120; I.V.:138.8] Out: -    Intake/Output this shift:  Total I/O In: 33.4 [I.V.:33.4] Out: -   Physical Exam: General: No acute distress  Head: Normocephalic, atraumatic. Moist oral mucosal membranes  Eyes: Anicteric  Neck: Supple, trachea midline  Lungs:  Clear to auscultation, normal effort  Heart: S1S2 no rubs  Abdomen:  Soft, nontender, BS present  Extremities: Trace peripheral edema. Gangrene left 1st toe, right 2nd toe  Neurologic: Nonfocal, moving all four extremities  Skin: No lesions  Access: LUE AV access    Basic Metabolic Panel:  Recent Labs Lab 06/24/16 1524 06/25/16 0434 06/25/16 0930  NA 132* 133*  --   K 4.0 4.2  --   CL 94* 95*  --   CO2 28 28  --   GLUCOSE 61* 110*  --   BUN 26* 37*  --   CREATININE 6.66* 7.88*  --   CALCIUM 8.3* 8.0*  --   PHOS  --   --  4.6    Liver Function Tests:  Recent Labs Lab 06/24/16 1524  AST 15  ALT 25  ALKPHOS 75  BILITOT 0.6  PROT 8.0  ALBUMIN 3.2*   No results for input(s): LIPASE, AMYLASE in the last 168 hours. No results for input(s): AMMONIA in the last 168 hours.  CBC:  Recent Labs Lab 06/24/16 1524 06/25/16 0434  WBC 10.4 6.7  HGB 9.9* 10.1*  HCT  29.0* 29.1*  MCV 85.8 86.2  PLT 324 265    Cardiac Enzymes: No results for input(s): CKTOTAL, CKMB, CKMBINDEX, TROPONINI in the last 168 hours.  BNP: Invalid input(s): POCBNP  CBG:  Recent Labs Lab 06/26/16 1120 06/26/16 1237 06/26/16 1637 06/26/16 2131 06/27/16 0733  GLUCAP 66 123* 90 125* 90    Microbiology: Results for orders placed or performed during the hospital encounter of 06/24/16  MRSA PCR Screening     Status: None   Collection Time: 06/24/16  5:05 AM  Result Value Ref Range Status   MRSA by PCR NEGATIVE NEGATIVE Final    Comment:        The GeneXpert MRSA Assay (FDA approved for NASAL specimens only), is one component of a comprehensive MRSA colonization surveillance program. It is not intended to diagnose MRSA infection nor to guide or monitor treatment for MRSA infections.     Coagulation Studies:  Recent Labs  06/24/16 1133 06/24/16 1524 06/25/16 0434 06/26/16 0444  LABPROT  --  36.2* 38.6* 17.8*  INR 4.9 3.53 3.82 1.45    Urinalysis: No results for input(s): COLORURINE, LABSPEC, PHURINE, GLUCOSEU, HGBUR, BILIRUBINUR, KETONESUR, PROTEINUR, UROBILINOGEN, NITRITE, LEUKOCYTESUR in the last  72 hours.  Invalid input(s): APPERANCEUR    Imaging: Mr Brain Wo Contrast  Result Date: 06/26/2016 CLINICAL DATA:  61 y/o  M; cerebral infarction. EXAM: MRI HEAD WITHOUT CONTRAST TECHNIQUE: Multiplanar, multiecho pulse sequences of the brain and surrounding structures were obtained without intravenous contrast. COMPARISON:  06/24/2016 CT of the head. FINDINGS: Brain: No acute infarction, hemorrhage, hydrocephalus, extra-axial collection or mass lesion. Foci of T2 FLAIR hyperintense signal abnormality in periventricular white matter are nonspecific but compatible with mild chronic microvascular ischemic changes. There is mild brain parenchymal volume loss. Small chronic lacunar infarcts are present within the left inferior cerebellar hemisphere, bilateral  thalamus, and right lentiform nucleus. Vascular: Normal flow voids. Skull and upper cervical spine: Normal marrow signal. Sinuses/Orbits: Negative. Other: None. IMPRESSION: 1. No acute intracranial abnormality identified. 2. Mild chronic microvascular ischemic changes and mild parenchymal volume loss of the brain. 3. Small chronic lacunar infarcts are present within the left inferior cerebellar hemisphere, bilateral thalamus, and right lentiform nucleus. Electronically Signed   By: Mitzi Hansen M.D.   On: 06/26/2016 13:58     Medications:   . sodium chloride    . sodium chloride    . heparin 1,200 Units/hr (06/27/16 0740)   . amLODipine  2.5 mg Oral Daily  . aspirin EC  81 mg Oral Daily  . atorvastatin  80 mg Oral QPM  . carvedilol  25 mg Oral BID WC  . clopidogrel  75 mg Oral Daily  . epoetin (EPOGEN/PROCRIT) injection  4,000 Units Intravenous Q T,Th,Sa-HD  . ferric citrate  420 mg Oral TID WC  . insulin aspart  0-5 Units Subcutaneous QHS  . insulin aspart  0-9 Units Subcutaneous TID WC  . insulin glargine  22 Units Subcutaneous QHS  . lisinopril  2.5 mg Oral Daily  . living well with diabetes book   Does not apply Once  . Melatonin  5 mg Oral QHS  . pneumococcal 23 valent vaccine  0.5 mL Intramuscular Tomorrow-1000  . tamsulosin  0.4 mg Oral QHS   sodium chloride, sodium chloride, acetaminophen **OR** acetaminophen, alteplase, heparin, lidocaine (PF), lidocaine-prilocaine, morphine injection, nitroGLYCERIN, ondansetron **OR** ondansetron (ZOFRAN) IV, oxyCODONE, pentafluoroprop-tetrafluoroeth  Assessment/ Plan:  61 y.o. male ESRD on HD TTS, coronary artery disease, diabetes mellitus type 2, hyperlipidemia, hypertension, history of CVA, anemia chronic kidney disease, secondary hyperparathyroidism who presents with gangrenous changes on toes of both feet.  1.  ESRD on HD TTS: Patient due for hemodialysis today. Orders have been prepared.  2.  Anemia chronic kidney disease.   Patient to be administered Epogen 4000 units IV with dialysis today.  3.  Secondary hyperparathyroidism.  Repeat phosphorus today.  Continue ferric citrate  po tid/wm.   4.  Hypertension. Blood pressure continues to be under good control at 125/53.  Continue amlodipine,carvedilol, and lisinopril.  5.   Peripheral arterial disease:  Has vascular disease in both lower extremities.  He is status post PTA left superficial femoral and popliteal arteries and left peroneal artery.    LOS: 3 Eddie Myers 4/28/20188:28 AM

## 2016-06-27 NOTE — Progress Notes (Signed)
Pre hd assessment  

## 2016-06-27 NOTE — Progress Notes (Signed)
Post hd vitals 

## 2016-06-27 NOTE — Progress Notes (Signed)
[facility] [place] [facility]   PATIENT NAME: Eddie Myers    MR#:  [id number]  DATE OF BIRTH:  12-May-1955  SUBJECTIVE:  CHIEF COMPLAINT:  No chief complaint on file.  Continues to have pain in bilateral feet. MAXIMUM TEMPERATURE 102.8 overnight.  REVIEW OF SYSTEMS:    Review of Systems  Constitutional: Positive for malaise/fatigue. Negative for chills and fever.  HENT: Negative for sore throat.   Eyes: Negative for blurred vision, double vision and pain.  Respiratory: Negative for cough, hemoptysis, shortness of breath and wheezing.   Cardiovascular: Negative for chest pain, palpitations, orthopnea and leg swelling.  Gastrointestinal: Negative for abdominal pain, constipation, diarrhea, heartburn, nausea and vomiting.  Genitourinary: Negative for dysuria and hematuria.  Musculoskeletal: Negative for back pain and joint pain.  Skin: Negative for rash.  Neurological: Positive for weakness. Negative for sensory change, speech change, focal weakness and headaches.  Endo/Heme/Allergies: Does not bruise/bleed easily.  Psychiatric/Behavioral: Negative for depression. The patient is not nervous/anxious.     DRUG ALLERGIES:  No Known Allergies  VITALS:  Blood pressure (!) 125/53, pulse 93, temperature (!) 102.3 F (39.1 C), temperature source Oral, resp. rate 18, height  (1.727 m), weight 75 kg (165 lb 5.5 oz), SpO2 94 %.  PHYSICAL EXAMINATION:   Physical Exam  GENERAL:  61 y.o.-year-old patient lying in the bed with no acute distress.  EYES: Pupils equal, round, reactive to light and accommodation. No scleral icterus. Extraocular muscles intact.  HEENT: Head atraumatic, normocephalic. Oropharynx and nasopharynx clear.  NECK:  Supple, no jugular venous distention. No thyroid enlargement, no tenderness.  LUNGS: Normal breath sounds bilaterally, no wheezing, rales, rhonchi. No use of accessory muscles of respiration.  CARDIOVASCULAR: S1, S2 normal. No  murmurs, rubs, or gallops.  ABDOMEN: Soft, nontender, nondistended. Bowel sounds present. No organomegaly or mass.  EXTREMITIES: Toes bilaterally are discolored with dry gangrene.  NEUROLOGIC: Cranial nerves II through XII are intact. No focal Motor or sensory deficits b/l.   PSYCHIATRIC: The patient is alert and oriented x 3.  SKIN: No obvious rash, lesion, or ulcer.   LABORATORY PANEL:   CBC  Recent Labs Lab 06/25/16 0434  WBC 6.7  HGB 10.1*  HCT 29.1*  PLT 265   ------------------------------------------------------------------------------------------------------------------ Chemistries   Recent Labs Lab 06/24/16 1524 06/25/16 0434  NA 132* 133*  K 4.0 4.2  CL 94* 95*  CO2 28 28  GLUCOSE 61* 110*  BUN 26* 37*  CREATININE 6.66* 7.88*  CALCIUM 8.3* 8.0*  AST 15  --   ALT 25  --   ALKPHOS 75  --   BILITOT 0.6  --    ------------------------------------------------------------------------------------------------------------------  Cardiac Enzymes No results for input(s): TROPONINI in the last 168 hours. ------------------------------------------------------------------------------------------------------------------  RADIOLOGY:  Mr Brain 90 Contrast  Result Date: 06/26/2016 CLINICAL DATA:  61 y/o  M; cerebral infarction. EXAM: MRI HEAD WITHOUT CONTRAST TECHNIQUE: Multiplanar, multiecho pulse sequences of the brain and surrounding structures were obtained without intravenous contrast. COMPARISON:  06/24/2016 CT of the head. FINDINGS: Brain: No acute infarction, hemorrhage, hydrocephalus, extra-axial collection or mass lesion. Foci of T2 FLAIR hyperintense signal abnormality in periventricular white matter are nonspecific but compatible with mild chronic microvascular ischemic changes. There is mild brain parenchymal volume loss. Small chronic lacunar infarcts are present within the left inferior cerebellar hemisphere, bilateral thalamus, and right lentiform nucleus.  Vascular: Normal flow voids. Skull and upper cervical spine: Normal marrow signal. Sinuses/Orbits: Negative. Other: None. IMPRESSION: 1. No acute  intracranial abnormality identified. 2. Mild chronic microvascular ischemic changes and mild parenchymal volume loss of the brain. 3. Small chronic lacunar infarcts are present within the left inferior cerebellar hemisphere, bilateral thalamus, and right lentiform nucleus. Electronically Signed   By: Mitzi Hansen M.D.   On: 06/26/2016 13:58     ASSESSMENT AND PLAN:   Eddie Myers  is a 61 y.o. male with a known history of End-stage renal disease on hemodialysis, diabetes, hypertension, hyperlipidemia, history of previous CVA, history of coronary artery disease status post bypass who presented to the hospital from his cardiologist's office due to bilateral lower extremity necrotic toes redness and pain.  * Fever likely due to gangrenous toes Check blood cx Start IV abx  * Bilateral lower extremity cyanosis with necrotic toes Pain control with IV and oral pain medications Status post left lower extremity angiogram with angioplasty of left superficial femoral, popliteal and peroneal arteries. Right lower extremity angiogram on Tuesday.  * Subacute right sided vision loss-patient had symptoms about a month ago.  -CT head shows a age-indeterminate right sided thalamic CVA Appreciate Neurology input MRI shows old CVA  * Paroxysmal atrial fibrillation on Coumadin. INR is 1.4.  On heparin drip Coumadin on Tuesday  * End-stage renal disease on hemodialysis-patient gets dialysis on Tuesday Thursday Saturday Getting dialysis today  * Essential hypertension-continue carvedilol, lisinopril, Norvasc.  * Diabetes type 2 without complication-continue Lantus, sliding scale insulin.  * Hyperlipidemia-continue atorvastatin.  All the records are reviewed and case discussed with Care Management/Social Worker. Management plans discussed with  the patient, family and they are in agreement.  CODE STATUS: FULL CODE  DVT Prophylaxis: SCDs  TOTAL TIME TAKING CARE OF THIS PATIENT: 35 minutes.   POSSIBLE D/C IN 2-3 DAYS, DEPENDING ON CLINICAL CONDITION.  Milagros Loll R M.D on 06/27/2016 at 10:45 AM  Between 7am to 6pm - Pager - [phone]  After 6pm go to [email or web address] - password [email or web address]  [facility]  Office  [phone]  CC: Primary care physician; Pcp Not In System  Note: This dictation was prepared with Dragon dictation along with smaller phrase technology. Any transcriptional errors that result from this process are unintentional.

## 2016-06-27 NOTE — Progress Notes (Signed)
Post hd assessment 

## 2016-06-27 NOTE — Progress Notes (Signed)
Hd start 

## 2016-06-27 NOTE — Progress Notes (Signed)
ANTICOAGULATION CONSULT NOTE - Initial Consult  Pharmacy Consult for heparin Indication: atrial fibrillation  No Known Allergies  Patient Measurements: Height:  (172.7 cm) Weight: 165 lb 5.5 oz (75 kg) IBW/kg (Calculated) : 68.4 Heparin Dosing Weight:   Vital Signs: Temp: 98.1 F (36.7 C) (04/27 0713) Temp Source: Oral (04/27 0713) BP: 137/63 (04/27 1053) Pulse Rate: 84 (04/27 1053)  Labs:  Recent Labs (last 2 labs)    Recent Labs  06/24/16 1524 06/25/16 0434 06/26/16 0444  HGB 9.9* 10.1*  --   HCT 29.0* 29.1*  --   PLT 324 265  --   LABPROT 36.2* 38.6* 17.8*  INR 3.53 3.82 1.45  CREATININE 6.66* 7.88*  --       Estimated Creatinine Clearance: 9.4 mL/min (A) (by C-G formula based on SCr of 7.88 mg/dL (H)).    Assessment: 61 yom with severe pain in both feet. Planning for angiogram on Tuesday. Patient takes VKA at home for AF. Pharmacy consulted to dose heparin for AF bridge perioperatively. Patient received heparin subcutaneously once this morning at 08:48. Defer initial bolus.   Goal of Therapy:  Heparin level 0.3-0.7 units/ml Monitor platelets by anticoagulation protocol: Yes   Plan:  Current orders for heparin 1200 units/hr. Heparin level therapeutic x 1, will check confirmatory level in 8 hours. Daily CBC per protocol.  4/28 1610: HL therapeutic x 2 at 0.39. Will continue current rate of heparin 1200 units/hr. Will continue to monitor HL and CBC with AM labs per protocol.   Gardner Candle, PharmD, BCPS Clinical Pharmacist 06/27/2016 6:17 PM

## 2016-06-27 NOTE — Progress Notes (Signed)
ANTICOAGULATION CONSULT NOTE - Initial Consult  Pharmacy Consult for heparin Indication: atrial fibrillation  No Known Allergies  Patient Measurements: Height:  (172.7 cm) Weight: 165 lb 5.5 oz (75 kg) IBW/kg (Calculated) : 68.4 Heparin Dosing Weight:   Vital Signs: Temp: 98.1 F (36.7 C) (04/27 0713) Temp Source: Oral (04/27 0713) BP: 137/63 (04/27 1053) Pulse Rate: 84 (04/27 1053)  Labs:  Recent Labs (last 2 labs)    Recent Labs  06/24/16 1524 06/25/16 0434 06/26/16 0444  HGB 9.9* 10.1*  --   HCT 29.0* 29.1*  --   PLT 324 265  --   LABPROT 36.2* 38.6* 17.8*  INR 3.53 3.82 1.45  CREATININE 6.66* 7.88*  --       Estimated Creatinine Clearance: 9.4 mL/min (A) (by C-G formula based on SCr of 7.88 mg/dL (H)).   Medical History:     Past Medical History:  Diagnosis Date  . Anginal pain (HCC)   . Coronary artery disease   . Diabetes mellitus without complication (HCC)   . Dialysis patient (HCC)    Tues, Thurs, Sat  . Dyspnea    with exertion  . Elevated lipids   . Enlarged prostate   . ESRD (end stage renal disease) (HCC)    dialysis M-W-F  . Hypertension   . Myocardial infarction (HCC) 08/2015  . Stroke [facility])    Lt side this year July    Medications:  Infusions:  . sodium chloride    . sodium chloride    . heparin      Assessment: 61 yom with severe pain in both feet. Planning for angiogram on Tuesday. Patient takes VKA at home for AF. Pharmacy consulted to dose heparin for AF bridge perioperatively. Patient received heparin subcutaneously once this morning at 08:48. Defer initial bolus.   Goal of Therapy:  Heparin level 0.3-0.7 units/ml Monitor platelets by anticoagulation protocol: Yes   Plan:  Start heparin infusion at 1050 units/hr Check anti-Xa level in 8 hours and daily while on heparin Continue to monitor H&H and platelets  4/28 @ 2245 HL 0.18 subtherapeutic. Will rebolus w/ heparin 2250 units IV x  1, increase heparin drip to 1200 units/hr and will recheck HL @ 0800.  Thank you for this consult.  Thomasene Ripple, PharmD, BCPS Clinical Pharmacist 06/27/2016

## 2016-06-27 NOTE — Progress Notes (Signed)
Notified MD of elevated temp, orders taken

## 2016-06-28 ENCOUNTER — Inpatient Hospital Stay: Payer: Medicare Other

## 2016-06-28 LAB — CBC
HEMATOCRIT: 29.1 % — AB (ref 40.0–52.0)
HEMOGLOBIN: 9.9 g/dL — AB (ref 13.0–18.0)
MCH: 29.9 pg (ref 26.0–34.0)
MCHC: 33.9 g/dL (ref 32.0–36.0)
MCV: 88 fL (ref 80.0–100.0)
Platelets: 225 10*3/uL (ref 150–440)
RBC: 3.3 MIL/uL — ABNORMAL LOW (ref 4.40–5.90)
RDW: 14.7 % — AB (ref 11.5–14.5)
WBC: 5.3 10*3/uL (ref 3.8–10.6)

## 2016-06-28 LAB — HEPARIN LEVEL (UNFRACTIONATED)
HEPARIN UNFRACTIONATED: 0.36 [IU]/mL (ref 0.30–0.70)
Heparin Unfractionated: 0.28 IU/mL — ABNORMAL LOW (ref 0.30–0.70)
Heparin Unfractionated: 0.5 IU/mL (ref 0.30–0.70)

## 2016-06-28 LAB — GLUCOSE, CAPILLARY
GLUCOSE-CAPILLARY: 151 mg/dL — AB (ref 65–99)
GLUCOSE-CAPILLARY: 167 mg/dL — AB (ref 65–99)
Glucose-Capillary: 155 mg/dL — ABNORMAL HIGH (ref 65–99)
Glucose-Capillary: 205 mg/dL — ABNORMAL HIGH (ref 65–99)
Glucose-Capillary: 104 mg/dL — ABNORMAL HIGH (ref 65–99)

## 2016-06-28 MED ORDER — POLYETHYLENE GLYCOL 3350 17 G PO PACK
17.0000 g | PACK | Freq: Every day | ORAL | Status: AC
  Administered 2016-06-28 – 2016-06-29 (×2): 17 g via ORAL
  Filled 2016-06-28 (×2): qty 1

## 2016-06-28 MED ORDER — PIPERACILLIN-TAZOBACTAM 3.375 G IVPB
3.3750 g | Freq: Two times a day (BID) | INTRAVENOUS | Status: DC
  Administered 2016-06-28 – 2016-06-29 (×3): 3.375 g via INTRAVENOUS
  Filled 2016-06-28 (×6): qty 50

## 2016-06-28 MED ORDER — HEPARIN BOLUS VIA INFUSION
1100.0000 [IU] | Freq: Once | INTRAVENOUS | Status: AC
  Administered 2016-06-28: 1100 [IU] via INTRAVENOUS
  Filled 2016-06-28: qty 1100

## 2016-06-28 MED ORDER — SENNA 8.6 MG PO TABS
1.0000 | ORAL_TABLET | Freq: Every day | ORAL | Status: DC
  Administered 2016-06-28 – 2016-07-06 (×7): 8.6 mg via ORAL
  Filled 2016-06-28 (×8): qty 1

## 2016-06-28 NOTE — Progress Notes (Signed)
Podiatry f/u.  Ischemic gangrenous changes to left great toe, 2nd and 3rd toes.  Also right 2nd toe.  Erythema along dorsal foot from 2nd toe noted.  No purulence from toe.  No debridment at this time.  Pt is s/p revascularization and will plan to follow outpt for demarcation and possible amputation.  F/U with Dr. Alberteen Spindle upon d/c.

## 2016-06-28 NOTE — Progress Notes (Signed)
ANTICOAGULATION CONSULT NOTE   Pharmacy Consult for heparin Indication: atrial fibrillation  No Known Allergies  Patient Measurements: Height:  (172.7 cm) Weight: 165 lb 5.5 oz (75 kg) IBW/kg (Calculated) : 68.4 Heparin Dosing Weight:   Vital Signs: Temp: 98.1 F (36.7 C) (04/27 0713) Temp Source: Oral (04/27 0713) BP: 137/63 (04/27 1053) Pulse Rate: 84 (04/27 1053)  Labs:  Recent Labs (last 2 labs)    Recent Labs  06/24/16 1524 06/25/16 0434 06/26/16 0444  HGB 9.9* 10.1* --   HCT 29.0* 29.1* --   PLT 324 265 --   LABPROT 36.2* 38.6* 17.8*  INR 3.53 3.82 1.45  CREATININE 6.66* 7.88* --       Estimated Creatinine Clearance: 9.4 mL/min (A) (by C-G formula based on SCr of 7.88 mg/dL (H)).    Assessment: 61 yom with severe pain in both feet. Planning for angiogram on Tuesday. Patient takes VKA at home for AF. Pharmacy consulted to dose heparin for AF bridge perioperatively.   4/29 16:30 Heparin level resulted at 0.36  Goal of Therapy: Heparin level 0.3-0.7 units/ml Monitor platelets by anticoagulation protocol: Yes  Plan: Will continue current orders for heparin 1300 units/hr. Heparin level therapeutic x 2.Will check next Heparin level with AM labs along with CBC.  Thank you for this consult.  Clovia Cuff, PharmD, BCPS 06/28/2016 5:21 PM

## 2016-06-28 NOTE — Progress Notes (Signed)
[facility] - [place] at [facility]   PATIENT NAME: Eddie Myers    MR#:  [id number]  DATE OF BIRTH:  Apr 21, 1955  SUBJECTIVE:  CHIEF COMPLAINT:  No chief complaint on file.  Continues to have pain in bilateral feet.  Fever again overnight of 101.5. On IV vancomycin.  Constipated.  REVIEW OF SYSTEMS:    Review of Systems  Constitutional: Positive for malaise/fatigue. Negative for chills and fever.  HENT: Negative for sore throat.   Eyes: Negative for blurred vision, double vision and pain.  Respiratory: Negative for cough, hemoptysis, shortness of breath and wheezing.   Cardiovascular: Negative for chest pain, palpitations, orthopnea and leg swelling.  Gastrointestinal: Negative for abdominal pain, constipation, diarrhea, heartburn, nausea and vomiting.  Genitourinary: Negative for dysuria and hematuria.  Musculoskeletal: Negative for back pain and joint pain.  Skin: Negative for rash.  Neurological: Positive for weakness. Negative for sensory change, speech change, focal weakness and headaches.  Endo/Heme/Allergies: Does not bruise/bleed easily.  Psychiatric/Behavioral: Negative for depression. The patient is not nervous/anxious.    DRUG ALLERGIES:  No Known Allergies  VITALS:  Blood pressure (!) 114/52, pulse 78, temperature (!) 100.5 F (38.1 C), temperature source Oral, resp. rate 18, height  (1.727 m), weight 73.1 kg (161 lb 2.5 oz), SpO2 94 %.  PHYSICAL EXAMINATION:   Physical Exam  GENERAL:  61 y.o.-year-old patient lying in the bed with no acute distress.  EYES: Pupils equal, round, reactive to light and accommodation. No scleral icterus. Extraocular muscles intact.  HEENT: Head atraumatic, normocephalic. Oropharynx and nasopharynx clear.  NECK:  Supple, no jugular venous distention. No thyroid enlargement, no tenderness.  LUNGS: Normal breath sounds bilaterally, no wheezing, rales, rhonchi. No use of accessory muscles of respiration.   CARDIOVASCULAR: S1, S2 normal. No murmurs, rubs, or gallops.  ABDOMEN: Soft, nontender, nondistended. Bowel sounds present. No organomegaly or mass.  EXTREMITIES: Toes bilaterally are discolored with dry gangrene.  NEUROLOGIC: Cranial nerves II through XII are intact. No focal Motor or sensory deficits b/l.   PSYCHIATRIC: The patient is alert and oriented x 3.  SKIN: No obvious rash, lesion, or ulcer.   LABORATORY PANEL:   CBC  Recent Labs Lab 06/28/16 0325  WBC 5.3  HGB 9.9*  HCT 29.1*  PLT 225   ------------------------------------------------------------------------------------------------------------------ Chemistries   Recent Labs Lab 06/24/16 1524 06/25/16 0434  NA 132* 133*  K 4.0 4.2  CL 94* 95*  CO2 28 28  GLUCOSE 61* 110*  BUN 26* 37*  CREATININE 6.66* 7.88*  CALCIUM 8.3* 8.0*  AST 15  --   ALT 25  --   ALKPHOS 75  --   BILITOT 0.6  --    ------------------------------------------------------------------------------------------------------------------  Cardiac Enzymes No results for input(s): TROPONINI in the last 168 hours. ------------------------------------------------------------------------------------------------------------------  RADIOLOGY:  Mr Brain 41 Contrast  Result Date: 06/26/2016 CLINICAL DATA:  61 y/o  M; cerebral infarction. EXAM: MRI HEAD WITHOUT CONTRAST TECHNIQUE: Multiplanar, multiecho pulse sequences of the brain and surrounding structures were obtained without intravenous contrast. COMPARISON:  06/24/2016 CT of the head. FINDINGS: Brain: No acute infarction, hemorrhage, hydrocephalus, extra-axial collection or mass lesion. Foci of T2 FLAIR hyperintense signal abnormality in periventricular white matter are nonspecific but compatible with mild chronic microvascular ischemic changes. There is mild brain parenchymal volume loss. Small chronic lacunar infarcts are present within the left inferior cerebellar hemisphere, bilateral  thalamus, and right lentiform nucleus. Vascular: Normal flow voids. Skull and upper cervical spine: Normal marrow signal. Sinuses/Orbits: Negative.  Other: None. IMPRESSION: 1. No acute intracranial abnormality identified. 2. Mild chronic microvascular ischemic changes and mild parenchymal volume loss of the brain. 3. Small chronic lacunar infarcts are present within the left inferior cerebellar hemisphere, bilateral thalamus, and right lentiform nucleus. Electronically Signed   By: Mitzi Hansen M.D.   On: 06/26/2016 13:58     ASSESSMENT AND PLAN:   Eddie Myers  is a 61 y.o. male with a known history of End-stage renal disease on hemodialysis, diabetes, hypertension, hyperlipidemia, history of previous CVA, history of coronary artery disease status post bypass who presented to the hospital from his cardiologist's office due to bilateral lower extremity necrotic toes redness and pain.  * Fever likely due to gangrenous toes ON vancomycin. Add Zosyn. Get x-ray of his bilateral feet to rule out osteomyelitis  * Bilateral lower extremity cyanosis with necrotic toes Pain control with IV and oral pain medications Status post left lower extremity angiogram with angioplasty of left superficial femoral, popliteal and peroneal arteries. Right lower extremity angiogram on Tuesday.  * Subacute right sided vision loss-patient had symptoms about a month ago.  -CT head shows a age-indeterminate right sided thalamic CVA Appreciate Neurology input MRI shows old CVA  * Paroxysmal atrial fibrillation on Coumadin.  Coumadin held for angiogram On heparin drip He may need Lovenox to go home with for a few days if scheduled for surgery by podiatry in a week. If not scheduled for surgery then coumadin  * End-stage renal disease on hemodialysis-patient gets dialysis on Tuesday Thursday Saturday Getting dialysis today  * Essential hypertension-continue carvedilol, lisinopril, Norvasc.  *  Diabetes type 2 without complication-continue Lantus, sliding scale insulin.  * Hyperlipidemia-continue atorvastatin.  All the records are reviewed and case discussed with Care Management/Social Worker. Management plans discussed with the patient, family and they are in agreement.  CODE STATUS: FULL CODE  DVT Prophylaxis: SCDs  TOTAL TIME TAKING CARE OF THIS PATIENT: 35 minutes.   POSSIBLE D/C IN 2-3 DAYS, DEPENDING ON CLINICAL CONDITION.  Milagros Loll R M.D on 06/28/2016 at 10:18 AM  Between 7am to 6pm - Pager - [phone]  After 6pm go to [email or web address] - password [email or web address]  [facility]  Office  [phone]  CC: Primary care physician; Pcp Not In System  Note: This dictation was prepared with Dragon dictation along with smaller phrase technology. Any transcriptional errors that result from this process are unintentional.

## 2016-06-28 NOTE — Progress Notes (Signed)
Temp 101 during the night, PRN Tylenol given. Temp 100.5 at recheck. No other complaints during the night. Nursing continues to monitor and assist patient as needed.

## 2016-06-28 NOTE — Progress Notes (Signed)
ANTICOAGULATION CONSULT NOTE - Initial Consult  Pharmacy Consult for heparin Indication: atrial fibrillation  No Known Allergies  Patient Measurements: Height:  (172.7 cm) Weight: 165 lb 5.5 oz (75 kg) IBW/kg (Calculated) : 68.4 Heparin Dosing Weight:   Vital Signs: Temp: 98.1 F (36.7 C) (04/27 0713) Temp Source: Oral (04/27 0713) BP: 137/63 (04/27 1053) Pulse Rate: 84 (04/27 1053)  Labs:  Recent Labs (last 2 labs)    Recent Labs  06/24/16 1524 06/25/16 0434 06/26/16 0444  HGB 9.9* 10.1* --   HCT 29.0* 29.1* --   PLT 324 265 --   LABPROT 36.2* 38.6* 17.8*  INR 3.53 3.82 1.45  CREATININE 6.66* 7.88* --       Estimated Creatinine Clearance: 9.4 mL/min (A) (by C-G formula based on SCr of 7.88 mg/dL (H)).    Assessment: 61 yom with severe pain in both feet. Planning for angiogram on Tuesday. Patient takes VKA at home for AF. Pharmacy consulted to dose heparin for AF bridge perioperatively. Patient received heparin subcutaneously once this morning at 08:48. Defer initial bolus.   Goal of Therapy: Heparin level 0.3-0.7 units/ml Monitor platelets by anticoagulation protocol: Yes  Plan: Current orders for heparin 1200 units/hr. Heparin level therapeutic x 1, will check confirmatory level in 8 hours. Daily CBC per protocol.  4/28 1610: HL therapeutic x 2 at 0.39. Will continue current rate of heparin 1200 units/hr. Will continue to monitor HL and CBC with AM labs per protocol.   4/29 @ 0325 HL 0.28 subtherapeutic. Will rebolus w/ heparin 1100 units IV x 1 and increase rate to 1300 units/hr and recheck a HL @ 0930.  Thank you for this consult.  Thomasene Ripple, PharmD, BCPS Clinical Pharmacist 06/28/2016

## 2016-06-28 NOTE — Progress Notes (Signed)
ANTICOAGULATION CONSULT NOTE - Initial Consult  Pharmacy Consult for heparin Indication: atrial fibrillation  No Known Allergies  Patient Measurements: Height:  (172.7 cm) Weight: 165 lb 5.5 oz (75 kg) IBW/kg (Calculated) : 68.4 Heparin Dosing Weight:   Vital Signs: Temp: 98.1 F (36.7 C) (04/27 0713) Temp Source: Oral (04/27 0713) BP: 137/63 (04/27 1053) Pulse Rate: 84 (04/27 1053)  Labs:  Recent Labs (last 2 labs)    Recent Labs  06/24/16 1524 06/25/16 0434 06/26/16 0444  HGB 9.9* 10.1* --   HCT 29.0* 29.1* --   PLT 324 265 --   LABPROT 36.2* 38.6* 17.8*  INR 3.53 3.82 1.45  CREATININE 6.66* 7.88* --       Estimated Creatinine Clearance: 9.4 mL/min (A) (by C-G formula based on SCr of 7.88 mg/dL (H)).    Assessment: 61 yom with severe pain in both feet. Planning for angiogram on Tuesday. Patient takes VKA at home for AF. Pharmacy consulted to dose heparin for AF bridge perioperatively.   Goal of Therapy: Heparin level 0.3-0.7 units/ml Monitor platelets by anticoagulation protocol: Yes  Plan: Current orders for heparin 1300 units/hr. Heparin level therapeutic x 1, will check confirmatory level in 8 hours.   Thank you for this consult.  Garlon Hatchet, PharmD, BCPS Clinical Pharmacist  06/28/2016

## 2016-06-28 NOTE — Progress Notes (Signed)
[facility]  ROUNDING NOTE   Subjective:  Patient completed hemodialysis yesterday. He tolerated this well. He is having some pain in his right foot but none in his left foot.   Objective:  Vital signs in last 24 hours:  Temp:  [98.2 F (36.8 C)-101 F (38.3 C)] 100.5 F (38.1 C) (04/29 0503) Pulse Rate:  [66-103] 78 (04/29 0749) Resp:  [10-18] 18 (04/29 0359) BP: (108-141)/(48-69) 114/52 (04/29 0749) SpO2:  [93 %-96 %] 94 % (04/29 0359) Weight:  [73.1 kg (161 lb 2.5 oz)-75 kg (165 lb 5.5 oz)] 73.1 kg (161 lb 2.5 oz) (04/28 1845)  Weight change: 0 kg (0 lb) Filed Weights   06/26/16 0713 06/27/16 1507 06/27/16 1845  Weight: 75 kg (165 lb 5.5 oz) 75 kg (165 lb 5.5 oz) 73.1 kg (161 lb 2.5 oz)    Intake/Output: I/O last 3 completed shifts: In: 1019.9 [P.O.:480; I.V.:389.9; IV Piggyback:150] Out: 2100 [Urine:100; Other:2000]   Intake/Output this shift:  No intake/output data recorded.  Physical Exam: General: No acute distress  Head: Normocephalic, atraumatic. Moist oral mucosal membranes  Eyes: Anicteric  Neck: Supple, trachea midline  Lungs:  Clear to auscultation, normal effort  Heart: S1S2 no rubs  Abdomen:  Soft, nontender, BS present  Extremities: Trace peripheral edema. Gangrene left 1st toe, right 2nd toe  Neurologic: Nonfocal, moving all four extremities  Skin: No lesions  Access: LUE AV access    Basic Metabolic Panel:  Recent Labs Lab 06/24/16 1524 06/25/16 0434 06/25/16 0930 06/27/16 1610  NA 132* 133*  --   --   K 4.0 4.2  --   --   CL 94* 95*  --   --   CO2 28 28  --   --   GLUCOSE 61* 110*  --   --   BUN 26* 37*  --   --   CREATININE 6.66* 7.88*  --   --   CALCIUM 8.3* 8.0*  --   --   PHOS  --   --  4.6 5.3*    Liver Function Tests:  Recent Labs Lab 06/24/16 1524  AST 15  ALT 25  ALKPHOS 75  BILITOT 0.6  PROT 8.0  ALBUMIN 3.2*   No results for input(s): LIPASE, AMYLASE in the last 168 hours. No results for  input(s): AMMONIA in the last 168 hours.  CBC:  Recent Labs Lab 06/24/16 1524 06/25/16 0434 06/27/16 1525 06/28/16 0325  WBC 10.4 6.7 7.0 5.3  HGB 9.9* 10.1* 9.3* 9.9*  HCT 29.0* 29.1* 27.3* 29.1*  MCV 85.8 86.2 87.8 88.0  PLT 324 265 211 225    Cardiac Enzymes: No results for input(s): CKTOTAL, CKMB, CKMBINDEX, TROPONINI in the last 168 hours.  BNP: Invalid input(s): POCBNP  CBG:  Recent Labs Lab 06/27/16 0733 06/27/16 1136 06/27/16 2117 06/28/16 0700 06/28/16 0750  GLUCAP 90 152* 182* 151* 155*    Microbiology: Results for orders placed or performed during the hospital encounter of 06/24/16  MRSA PCR Screening     Status: None   Collection Time: 06/24/16  5:05 AM  Result Value Ref Range Status   MRSA by PCR NEGATIVE NEGATIVE Final    Comment:        The GeneXpert MRSA Assay (FDA approved for NASAL specimens only), is one component of a comprehensive MRSA colonization surveillance program. It is not intended to diagnose MRSA infection nor to guide or monitor treatment for MRSA infections.   CULTURE, BLOOD (ROUTINE X 2) w  Reflex to ID Panel     Status: None (Preliminary result)   Collection Time: 06/27/16  7:22 AM  Result Value Ref Range Status   Specimen Description BLOOD RIGHT HAND  Final   Special Requests   Final    BOTTLES DRAWN AEROBIC AND ANAEROBIC Blood Culture adequate volume   Culture NO GROWTH < 24 HOURS  Final   Report Status PENDING  Incomplete  CULTURE, BLOOD (ROUTINE X 2) w Reflex to ID Panel     Status: None (Preliminary result)   Collection Time: 06/27/16  7:29 AM  Result Value Ref Range Status   Specimen Description BLOOD RIGHT ASSIST CONTROL  Final   Special Requests   Final    BOTTLES DRAWN AEROBIC AND ANAEROBIC Blood Culture adequate volume   Culture NO GROWTH < 24 HOURS  Final   Report Status PENDING  Incomplete    Coagulation Studies:  Recent Labs  06/26/16 0444  LABPROT 17.8*  INR 1.45    Urinalysis: No results  for input(s): COLORURINE, LABSPEC, PHURINE, GLUCOSEU, HGBUR, BILIRUBINUR, KETONESUR, PROTEINUR, UROBILINOGEN, NITRITE, LEUKOCYTESUR in the last 72 hours.  Invalid input(s): APPERANCEUR    Imaging: Mr Brain Wo Contrast  Result Date: 06/26/2016 CLINICAL DATA:  61 y/o  M; cerebral infarction. EXAM: MRI HEAD WITHOUT CONTRAST TECHNIQUE: Multiplanar, multiecho pulse sequences of the brain and surrounding structures were obtained without intravenous contrast. COMPARISON:  06/24/2016 CT of the head. FINDINGS: Brain: No acute infarction, hemorrhage, hydrocephalus, extra-axial collection or mass lesion. Foci of T2 FLAIR hyperintense signal abnormality in periventricular white matter are nonspecific but compatible with mild chronic microvascular ischemic changes. There is mild brain parenchymal volume loss. Small chronic lacunar infarcts are present within the left inferior cerebellar hemisphere, bilateral thalamus, and right lentiform nucleus. Vascular: Normal flow voids. Skull and upper cervical spine: Normal marrow signal. Sinuses/Orbits: Negative. Other: None. IMPRESSION: 1. No acute intracranial abnormality identified. 2. Mild chronic microvascular ischemic changes and mild parenchymal volume loss of the brain. 3. Small chronic lacunar infarcts are present within the left inferior cerebellar hemisphere, bilateral thalamus, and right lentiform nucleus. Electronically Signed   By: Mitzi Hansen M.D.   On: 06/26/2016 13:58     Medications:   . sodium chloride    . sodium chloride    . heparin 1,300 Units/hr (06/28/16 0844)  . piperacillin-tazobactam (ZOSYN)  IV    . vancomycin (VANCOCIN) 750 mg/138mL IVPB Stopped (06/27/16 1903)   . amLODipine  2.5 mg Oral Daily  . aspirin EC  81 mg Oral Daily  . atorvastatin  80 mg Oral QPM  . carvedilol  25 mg Oral BID WC  . clopidogrel  75 mg Oral Daily  . epoetin (EPOGEN/PROCRIT) injection  4,000 Units Intravenous Q T,Th,Sa-HD  . ferric citrate  420  mg Oral TID WC  . insulin aspart  0-5 Units Subcutaneous QHS  . insulin aspart  0-9 Units Subcutaneous TID WC  . insulin glargine  22 Units Subcutaneous QHS  . lisinopril  2.5 mg Oral Daily  . living well with diabetes book   Does not apply Once  . Melatonin  5 mg Oral QHS  . pneumococcal 23 valent vaccine  0.5 mL Intramuscular Tomorrow-1000  . tamsulosin  0.4 mg Oral QHS   sodium chloride, sodium chloride, acetaminophen **OR** acetaminophen, alteplase, heparin, lidocaine (PF), lidocaine-prilocaine, morphine injection, nitroGLYCERIN, ondansetron **OR** ondansetron (ZOFRAN) IV, oxyCODONE, pentafluoroprop-tetrafluoroeth  Assessment/ Plan:  61 y.o. male ESRD on HD TTS, coronary artery disease, diabetes mellitus type 2, hyperlipidemia,  hypertension, history of CVA, anemia chronic kidney disease, secondary hyperparathyroidism who presents with gangrenous changes on toes of both feet.  1.  ESRD on HD TTS: Patient completed hemodialysis yesterday. No acute indication for dialysis today. Next dialysis scheduled for Tuesday.  2.  Anemia chronic kidney disease.  Hemoglobin currently 9.9. Continue Epogen with dialysis.  3.  Secondary hyperparathyroidism.  Phosphorus acceptable at 5.3.  Continue ferric citrate  po tid/wm.   4.  Hypertension. Blood pressure remains at target with a blood pressure of 114/52 today.  Continue amlodipine,carvedilol, and lisinopril.  5.   Peripheral arterial disease:  Has vascular disease in both lower extremities.  He is status post PTA left superficial femoral and popliteal arteries and left peroneal artery. Additional vascular procedures needed in his right lower extremity hopefully on Tuesday.   LOS: 4 Eddie Myers 4/29/20189:02 AM

## 2016-06-28 NOTE — Progress Notes (Signed)
Pharmacy Antibiotic Note  Eddie Myers is a 61 y.o. male with a h/o ESRD on HD admitted on 06/24/2016 with bilateral LE necrotic toes.  Pharmacy has been consulted for vancomycin and Zosyn dosing.  Plan: Vancomycin 1750 mg iv once then 750 mg iv q HD. Pre-HD vancomycin level with the third dialysis session. Goal level 15-25 mcg/ml.   Zosyn 3.375 g EI q 12 hours.   Height:  (172.7 cm) Weight: 161 lb 2.5 oz (73.1 kg) IBW/kg (Calculated) : 68.4  Temp (24hrs), Avg:99.3 F (37.4 C), Min:98.2 F (36.8 C), Max:101 F (38.3 C)   Recent Labs Lab 06/24/16 1524 06/25/16 0434 06/27/16 1525 06/28/16 0325  WBC 10.4 6.7 7.0 5.3  CREATININE 6.66* 7.88*  --   --     Estimated Creatinine Clearance: 9.4 mL/min (A) (by C-G formula based on SCr of 7.88 mg/dL (H)).    No Known Allergies  Antimicrobials this admission: cefazolin 4/27 x 1 vancomycin 4/28 >>  Zosyn 4/29 >>  Dose adjustments this admission:  Microbiology results: 4/28 BCx: NGTD 4/25 MRSA PCR: negative  Thank you for allowing pharmacy to be a part of this patient's care.  Valentina Gu 06/28/2016 7:33 AM

## 2016-06-29 LAB — CBC
HCT: 25.7 % — ABNORMAL LOW (ref 40.0–52.0)
HEMOGLOBIN: 8.8 g/dL — AB (ref 13.0–18.0)
MCH: 30.2 pg (ref 26.0–34.0)
MCHC: 34.2 g/dL (ref 32.0–36.0)
MCV: 88.3 fL (ref 80.0–100.0)
PLATELETS: 204 10*3/uL (ref 150–440)
RBC: 2.91 MIL/uL — AB (ref 4.40–5.90)
RDW: 14.6 % — ABNORMAL HIGH (ref 11.5–14.5)
WBC: 5.3 10*3/uL (ref 3.8–10.6)

## 2016-06-29 LAB — RENAL FUNCTION PANEL
ALBUMIN: 2.7 g/dL — AB (ref 3.5–5.0)
ANION GAP: 9 (ref 5–15)
BUN: 14 mg/dL (ref 6–20)
CALCIUM: 8.2 mg/dL — AB (ref 8.9–10.3)
CO2: 29 mmol/L (ref 22–32)
CREATININE: 3.69 mg/dL — AB (ref 0.61–1.24)
Chloride: 98 mmol/L — ABNORMAL LOW (ref 101–111)
GFR calc Af Amer: 19 mL/min — ABNORMAL LOW (ref >60)
GFR calc non Af Amer: 16 mL/min — ABNORMAL LOW (ref >60)
GLUCOSE: 158 mg/dL — AB (ref 65–99)
PHOSPHORUS: 2.8 mg/dL (ref 2.5–4.6)
Potassium: 3.9 mmol/L (ref 3.5–5.1)
SODIUM: 136 mmol/L (ref 135–145)

## 2016-06-29 LAB — GLUCOSE, CAPILLARY
GLUCOSE-CAPILLARY: 170 mg/dL — AB (ref 65–99)
Glucose-Capillary: 105 mg/dL — ABNORMAL HIGH (ref 65–99)
Glucose-Capillary: 195 mg/dL — ABNORMAL HIGH (ref 65–99)
Glucose-Capillary: 166 mg/dL — ABNORMAL HIGH (ref 65–99)

## 2016-06-29 LAB — HEPARIN LEVEL (UNFRACTIONATED): HEPARIN UNFRACTIONATED: 0.36 [IU]/mL (ref 0.30–0.70)

## 2016-06-29 MED ORDER — BENZONATATE 100 MG PO CAPS
200.0000 mg | ORAL_CAPSULE | Freq: Two times a day (BID) | ORAL | Status: DC | PRN
  Administered 2016-06-29: 200 mg via ORAL
  Filled 2016-06-29: qty 2

## 2016-06-29 NOTE — Progress Notes (Signed)
ANTICOAGULATION CONSULT NOTE   Pharmacy Consult for heparin Indication: atrial fibrillation  No Known Allergies  Patient Measurements: Height:  (172.7 cm) Weight: 165 lb 5.5 oz (75 kg) IBW/kg (Calculated) : 68.4 Heparin Dosing Weight:   Vital Signs: Temp: 98.1 F (36.7 C) (04/27 0713) Temp Source: Oral (04/27 0713) BP: 137/63 (04/27 1053) Pulse Rate: 84 (04/27 1053)  Labs:  Recent Labs (last 2 labs)    Recent Labs  06/24/16 1524 06/25/16 0434 06/26/16 0444  HGB 9.9* 10.1* --   HCT 29.0* 29.1* --   PLT 324 265 --   LABPROT 36.2* 38.6* 17.8*  INR 3.53 3.82 1.45  CREATININE 6.66* 7.88* --       Estimated Creatinine Clearance: 9.4 mL/min (A) (by C-G formula based on SCr of 7.88 mg/dL (H)).    Assessment: 61 yom with severe pain in both feet. Planning for angiogram on Tuesday. Patient takes VKA at home for AF. Pharmacy consulted to dose heparin for AF bridge perioperatively.   4/29 16:30 Heparin level resulted at 0.36  Goal of Therapy: Heparin level 0.3-0.7 units/ml Monitor platelets by anticoagulation protocol: Yes  Plan: Will continue current orders for heparin 1300 units/hr. Heparin level therapeutic x 2.Will check next Heparin level with AM labs along with CBC.  4/30 @ 0333 HL 0.36 therapeutic. Will continue current rate and recheck HL w/ am labs 5/1.  Thank you for this consult.  Thomasene Ripple, PharmD, BCPS Clinical Pharmacist 06/29/2016

## 2016-06-29 NOTE — Progress Notes (Signed)
[employer] [facility] - [place] at [facility]   PATIENT NAME: Eddie Myers    MR#:  [id number]  DATE OF BIRTH:  August 18, 1955  SUBJECTIVE:  CHIEF COMPLAINT:  No chief complaint on file. returned from dialysis, seen and interviewed with the help of Spanish interpreter,some throat pain REVIEW OF SYSTEMS:    Review of Systems  Constitutional: Positive for malaise/fatigue. Negative for chills and fever.  HENT: Negative for sore throat.   Eyes: Negative for blurred vision, double vision and pain.  Respiratory: Negative for cough, hemoptysis, shortness of breath and wheezing.   Cardiovascular: Negative for chest pain, palpitations, orthopnea and leg swelling.  Gastrointestinal: Negative for abdominal pain, constipation, diarrhea, heartburn, nausea and vomiting.  Genitourinary: Negative for dysuria and hematuria.  Musculoskeletal: Negative for back pain and joint pain.  Skin: Negative for rash.  Neurological: Positive for weakness. Negative for sensory change, speech change, focal weakness and headaches.  Endo/Heme/Allergies: Does not bruise/bleed easily.  Psychiatric/Behavioral: Negative for depression. The patient is not nervous/anxious.    DRUG ALLERGIES:  No Known Allergies  VITALS:  Blood pressure 134/60, pulse 92, temperature 98.4 F (36.9 C), temperature source Oral, resp. rate 20, height  (1.727 m), weight 77.2 kg (170 lb 3.1 oz), SpO2 96 %.  PHYSICAL EXAMINATION:   Physical Exam  GENERAL:  61 y.o.-year-old patient lying in the bed with no acute distress.  EYES: Pupils equal, round, reactive to light and accommodation. No scleral icterus. Extraocular muscles intact.  HEENT: Head atraumatic, normocephalic. Oropharynx and nasopharynx clear.  NECK:  Supple, no jugular venous distention. No thyroid enlargement, no tenderness.  LUNGS: Normal breath sounds bilaterally, no wheezing, rales, rhonchi. No use of accessory muscles of respiration.  CARDIOVASCULAR: S1, S2 normal.  No murmurs, rubs, or gallops.  ABDOMEN: Soft, nontender, nondistended. Bowel sounds present. No organomegaly or mass.  EXTREMITIES: Toes bilaterally are discolored with dry gangrene.  NEUROLOGIC: Cranial nerves II through XII are intact. No focal Motor or sensory deficits b/l.   PSYCHIATRIC: The patient is alert and oriented x 3.  SKIN: No obvious rash, lesion, or ulcer.   LABORATORY PANEL:   CBC  Recent Labs Lab 06/29/16 0333  WBC 5.3  HGB 8.8*  HCT 25.7*  PLT 204   ------------------------------------------------------------------------------------------------------------------ Chemistries   Recent Labs Lab 06/24/16 1524  06/29/16 1616  NA 132*  < > 136  K 4.0  < > 3.9  CL 94*  < > 98*  CO2 28  < > 29  GLUCOSE 61*  < > 158*  BUN 26*  < > 14  CREATININE 6.66*  < > 3.69*  CALCIUM 8.3*  < > 8.2*  AST 15  --   --   ALT 25  --   --   ALKPHOS 75  --   --   BILITOT 0.6  --   --   < > = values in this interval not displayed. ------------------------------------------------------------------------------------------------------------------  Cardiac Enzymes No results for input(s): TROPONINI in the last 168 hours. ------------------------------------------------------------------------------------------------------------------  RADIOLOGY:  Dg Foot Complete Left  Result Date: 06/28/2016 CLINICAL DATA:  Gangrene EXAM: LEFT FOOT - COMPLETE 3+ VIEW COMPARISON:  06/20/2016 FINDINGS: No acute fracture or dislocation. Generalized osteopenia. No periosteal reaction or bone destruction. No soft tissue emphysema. Extensive peripheral vascular atherosclerotic disease. IMPRESSION: 1.  No acute abnormality of the left foot. Electronically Signed   By: Elige Ko   On: 06/28/2016 11:33   Dg Foot Complete Right  Result Date: 06/28/2016 CLINICAL DATA:  Gangrene EXAM: RIGHT FOOT COMPLETE - 3+ VIEW COMPARISON:  06/20/2016 FINDINGS: No acute fracture or dislocation. Generalized osteopenia.  No periosteal reaction or bone destruction. No soft tissue emphysema. Extensive peripheral vascular atherosclerotic disease. IMPRESSION: 1. No acute abnormality of the right foot. Electronically Signed   By: Elige Ko   On: 06/28/2016 11:32     ASSESSMENT AND PLAN:   Eddie Myers  is a 61 y.o. male with a known history of End-stage renal disease on hemodialysis, diabetes, hypertension, hyperlipidemia, history of previous CVA, history of coronary artery disease status post bypass who presented to the hospital from his cardiologist's office due to bilateral lower extremity necrotic toes redness and pain.  * Fever likely due to gangrenous toes - Continue vancomycin And Zosyn. - x-ray of his bilateral feet Negative for osteomyelitis  * Bilateral lower extremity cyanosis with necrotic toes Pain control with IV and oral pain medications Status post left lower extremity angiogram with angioplasty of left superficial femoral, popliteal and peroneal arteries. Right lower extremity angiogram scheduled for tomorrow  * Subacute right sided vision loss-patient had symptoms about a month ago.  -CT head shows a age-indeterminate right sided thalamic CVA Appreciate Neurology input MRI shows old CVA  * Paroxysmal atrial fibrillation on Coumadin.  Coumadin held for angiogram On heparin drip He may need Lovenox to go home with for a few days if scheduled for surgery by podiatry in a week. If not scheduled for surgery then coumadin  * End-stage renal disease on hemodialysis-patient gets dialysis on Tuesday Thursday Saturday Getting dialysis today  * Essential hypertension-continue carvedilol, lisinopril, Norvasc.  * Diabetes type 2 without complication-continue Lantus, sliding scale insulin.  * Hyperlipidemia-continue atorvastatin  * Throght pain. - We will order some Tessalon Perles as needed  All the records are reviewed and case discussed with Care Management/Social Worker. Management  plans discussed with the patient, nursing and they are in agreement.  CODE STATUS: FULL CODE  DVT Prophylaxis: SCDs  TOTAL TIME TAKING CARE OF THIS PATIENT: 35 minutes.   POSSIBLE D/C IN 1-2 DAYS, DEPENDING ON CLINICAL CONDITION.  Delfino Lovett M.D on 06/29/2016 at 5:22 PM  Between 7am to 6pm - Pager - [phone]  After 6pm go to [email or web address] - password [email or web address]  [facility] [place] [facility]  Office  [phone]  CC: Primary care physician; Pcp Not In System  Note: This dictation was prepared with Dragon dictation along with smaller phrase technology. Any transcriptional errors that result from this process are unintentional.

## 2016-06-29 NOTE — Care Management Important Message (Signed)
Important Message  Patient Details  Name: Eddie Myers MRN: [id number] Date of Birth: Mar 22, 1955   Medicare Important Message Given:  Yes    Chapman Fitch, RN 06/29/2016, 2:39 PM

## 2016-06-29 NOTE — Progress Notes (Signed)
[facility]  ROUNDING NOTE   Subjective:   Seen and examined on hemodialysis treatment. Tolerating treatment well.  History taken earlier with daughter and Research officer, trade [employer].     HEMODIALYSIS FLOWSHEET:  Blood Flow Rate (mL/min): 400 mL/min Arterial Pressure (mmHg): 600 mmHg Venous Pressure (mmHg): 160 mmHg Transmembrane Pressure (mmHg): 60 mmHg Ultrafiltration Rate (mL/min): 0.5 mL/min Dialysate Flow Rate (mL/min): 600 ml/min Conductivity: Machine : 400 Conductivity: Machine : 400 Dialysis Fluid Bolus: Normal Saline Bolus Amount (mL): 250 mL Dialysate Change: 2K Intra-Hemodialysis Comments: RESTING COMFORTABLY    Objective:  Vital signs in last 24 hours:  Temp:  [97.6 F (36.4 C)-98.4 F (36.9 C)] 98.4 F (36.9 C) (04/30 1200) Pulse Rate:  [66-98] 68 (04/30 1330) Resp:  [12-18] 12 (04/30 1330) BP: (112-144)/(51-60) 115/53 (04/30 1330) SpO2:  [93 %-98 %] 93 % (04/30 1330) Weight:  [78.3 kg (172 lb 9.9 oz)] 78.3 kg (172 lb 9.9 oz) (04/30 1200)  Weight change:  Filed Weights   06/27/16 1507 06/27/16 1845 06/29/16 1200  Weight: 75 kg (165 lb 5.5 oz) 73.1 kg (161 lb 2.5 oz) 78.3 kg (172 lb 9.9 oz)    Intake/Output: I/O last 3 completed shifts: In: 1293.2 [P.O.:240; I.V.:803.2; IV Piggyback:250] Out: 225 [Urine:225]   Intake/Output this shift:  Total I/O In: 240 [P.O.:240] Out: 100 [Urine:100]  Physical Exam: General: No acute distress  Head: Normocephalic, atraumatic. Moist oral mucosal membranes  Eyes: Anicteric  Neck: Supple, trachea midline  Lungs:  Clear to auscultation, normal effort  Heart: S1S2 no rubs  Abdomen:  Soft, nontender, BS present  Extremities: No peripheral edema. Gangrene left 1st toe, right 2nd toe  Neurologic: Nonfocal, moving all four extremities  Skin: No lesions  Access: LUE AV F    Basic Metabolic Panel:  Recent Labs Lab 06/24/16 1524 06/25/16 0434 06/25/16 0930 06/27/16 1610  NA 132* 133*  --   --   K  4.0 4.2  --   --   CL 94* 95*  --   --   CO2 28 28  --   --   GLUCOSE 61* 110*  --   --   BUN 26* 37*  --   --   CREATININE 6.66* 7.88*  --   --   CALCIUM 8.3* 8.0*  --   --   PHOS  --   --  4.6 5.3*    Liver Function Tests:  Recent Labs Lab 06/24/16 1524  AST 15  ALT 25  ALKPHOS 75  BILITOT 0.6  PROT 8.0  ALBUMIN 3.2*   No results for input(s): LIPASE, AMYLASE in the last 168 hours. No results for input(s): AMMONIA in the last 168 hours.  CBC:  Recent Labs Lab 06/24/16 1524 06/25/16 0434 06/27/16 1525 06/28/16 0325 06/29/16 0333  WBC 10.4 6.7 7.0 5.3 5.3  HGB 9.9* 10.1* 9.3* 9.9* 8.8*  HCT 29.0* 29.1* 27.3* 29.1* 25.7*  MCV 85.8 86.2 87.8 88.0 88.3  PLT 324 265 211 225 204    Cardiac Enzymes: No results for input(s): CKTOTAL, CKMB, CKMBINDEX, TROPONINI in the last 168 hours.  BNP: Invalid input(s): POCBNP  CBG:  Recent Labs Lab 06/28/16 1144 06/28/16 1638 06/28/16 2116 06/29/16 0735 06/29/16 1133  GLUCAP 205* 104* 167* 105* 195*    Microbiology: Results for orders placed or performed during the hospital encounter of 06/24/16  MRSA PCR Screening     Status: None   Collection Time: 06/24/16  5:05 AM  Result Value Ref Range Status  MRSA by PCR NEGATIVE NEGATIVE Final    Comment:        The GeneXpert MRSA Assay (FDA approved for NASAL specimens only), is one component of a comprehensive MRSA colonization surveillance program. It is not intended to diagnose MRSA infection nor to guide or monitor treatment for MRSA infections.   CULTURE, BLOOD (ROUTINE X 2) w Reflex to ID Panel     Status: None (Preliminary result)   Collection Time: 06/27/16  7:22 AM  Result Value Ref Range Status   Specimen Description BLOOD RIGHT HAND  Final   Special Requests   Final    BOTTLES DRAWN AEROBIC AND ANAEROBIC Blood Culture adequate volume   Culture NO GROWTH 2 DAYS  Final   Report Status PENDING  Incomplete  CULTURE, BLOOD (ROUTINE X 2) w Reflex to ID  Panel     Status: None (Preliminary result)   Collection Time: 06/27/16  7:29 AM  Result Value Ref Range Status   Specimen Description BLOOD RIGHT ASSIST CONTROL  Final   Special Requests   Final    BOTTLES DRAWN AEROBIC AND ANAEROBIC Blood Culture adequate volume   Culture NO GROWTH 2 DAYS  Final   Report Status PENDING  Incomplete    Coagulation Studies: No results for input(s): LABPROT, INR in the last 72 hours.  Urinalysis: No results for input(s): COLORURINE, LABSPEC, PHURINE, GLUCOSEU, HGBUR, BILIRUBINUR, KETONESUR, PROTEINUR, UROBILINOGEN, NITRITE, LEUKOCYTESUR in the last 72 hours.  Invalid input(s): APPERANCEUR    Imaging: Dg Foot Complete Left  Result Date: 06/28/2016 CLINICAL DATA:  Gangrene EXAM: LEFT FOOT - COMPLETE 3+ VIEW COMPARISON:  06/20/2016 FINDINGS: No acute fracture or dislocation. Generalized osteopenia. No periosteal reaction or bone destruction. No soft tissue emphysema. Extensive peripheral vascular atherosclerotic disease. IMPRESSION: 1.  No acute abnormality of the left foot. Electronically Signed   By: Elige Ko   On: 06/28/2016 11:33   Dg Foot Complete Right  Result Date: 06/28/2016 CLINICAL DATA:  Gangrene EXAM: RIGHT FOOT COMPLETE - 3+ VIEW COMPARISON:  06/20/2016 FINDINGS: No acute fracture or dislocation. Generalized osteopenia. No periosteal reaction or bone destruction. No soft tissue emphysema. Extensive peripheral vascular atherosclerotic disease. IMPRESSION: 1. No acute abnormality of the right foot. Electronically Signed   By: Elige Ko   On: 06/28/2016 11:32     Medications:   . sodium chloride    . sodium chloride    . heparin 1,300 Units/hr (06/29/16 1610)  . piperacillin-tazobactam (ZOSYN)  IV Stopped (06/29/16 0040)  . vancomycin (VANCOCIN) 750 mg/153mL IVPB Stopped (06/27/16 1903)   . amLODipine  2.5 mg Oral Daily  . aspirin EC  81 mg Oral Daily  . atorvastatin  80 mg Oral QPM  . carvedilol  25 mg Oral BID WC  . clopidogrel   75 mg Oral Daily  . epoetin (EPOGEN/PROCRIT) injection  4,000 Units Intravenous Q T,Th,Sa-HD  . ferric citrate  420 mg Oral TID WC  . insulin aspart  0-5 Units Subcutaneous QHS  . insulin aspart  0-9 Units Subcutaneous TID WC  . insulin glargine  22 Units Subcutaneous QHS  . lisinopril  2.5 mg Oral Daily  . living well with diabetes book   Does not apply Once  . Melatonin  5 mg Oral QHS  . pneumococcal 23 valent vaccine  0.5 mL Intramuscular Tomorrow-1000  . senna  1 tablet Oral Daily  . tamsulosin  0.4 mg Oral QHS   sodium chloride, sodium chloride, acetaminophen **OR** acetaminophen, alteplase, heparin, lidocaine (PF),  lidocaine-prilocaine, morphine injection, nitroGLYCERIN, ondansetron **OR** ondansetron (ZOFRAN) IV, oxyCODONE, pentafluoroprop-tetrafluoroeth  Assessment/ Plan:  61 y.o. Hispanic male ESRD on HD TTS, coronary artery disease, diabetes mellitus type 2, hyperlipidemia, hypertension, history of CVA, anemia chronic kidney disease, secondary hyperparathyroidism who presents with gangrenous changes on toes of both feet.  [facility] TTS [address] Left AVF   1.  ESRD on HD TTS:  Hemodialysis today seeing he is scheduled for vascular procedure tomorrow. Dialysis need evaluated daily. Next treatment for Thursday.   2.  Anemia chronic kidney disease: hemoglobin 8.8 - Epogen with dialysis.  3.  Secondary hyperparathyroidism.  Phosphorus at goal - Auryxia with meals.   4.  Hypertension: hypotensive on treatment - Continue amlodipine,carvedilol, and lisinopril.  5.   Peripheral arterial disease:  Scheduled for right lower extremity angiogram by Dr. Gilda Crease on 5/1   LOS: 5 Eddie Myers 4/30/20182:14 PM

## 2016-06-30 LAB — GLUCOSE, CAPILLARY
GLUCOSE-CAPILLARY: 126 mg/dL — AB (ref 65–99)
Glucose-Capillary: 120 mg/dL — ABNORMAL HIGH (ref 65–99)
Glucose-Capillary: 136 mg/dL — ABNORMAL HIGH (ref 65–99)
Glucose-Capillary: 125 mg/dL — ABNORMAL HIGH (ref 65–99)

## 2016-06-30 LAB — HEPARIN LEVEL (UNFRACTIONATED): Heparin Unfractionated: 0.53 IU/mL (ref 0.30–0.70)

## 2016-06-30 MED ORDER — CEFAZOLIN SODIUM-DEXTROSE 1-4 GM/50ML-% IV SOLN
1.0000 g | INTRAVENOUS | Status: AC
  Filled 2016-06-30: qty 50

## 2016-06-30 MED ORDER — HEPARIN BOLUS VIA INFUSION
2300.0000 [IU] | Freq: Once | INTRAVENOUS | Status: DC
  Filled 2016-06-30: qty 2300

## 2016-06-30 MED ORDER — AMOXICILLIN-POT CLAVULANATE 500-125 MG PO TABS
1.0000 | ORAL_TABLET | ORAL | Status: DC
  Administered 2016-06-30 – 2016-07-08 (×7): 500 mg via ORAL
  Filled 2016-06-30 (×7): qty 1

## 2016-06-30 MED ORDER — AMOXICILLIN-POT CLAVULANATE 500-125 MG PO TABS
1.0000 | ORAL_TABLET | Freq: Two times a day (BID) | ORAL | Status: DC
  Filled 2016-06-30: qty 1

## 2016-06-30 NOTE — Progress Notes (Addendum)
ANTICOAGULATION CONSULT NOTE   Pharmacy Consult for heparin Indication: atrial fibrillation  No Known Allergies  Patient Measurements: Height:  (172.7 cm) Weight: 165 lb 5.5 oz (75 kg) IBW/kg (Calculated) : 68.4 Heparin Dosing Weight:   Vital Signs: Temp: 98.1 F (36.7 C) (04/27 0713) Temp Source: Oral (04/27 0713) BP: 137/63 (04/27 1053) Pulse Rate: 84 (04/27 1053)  Labs:  Recent Labs (last 2 labs)    Recent Labs  06/24/16 1524 06/25/16 0434 06/26/16 0444  HGB 9.9* 10.1* --   HCT 29.0* 29.1* --   PLT 324 265 --   LABPROT 36.2* 38.6* 17.8*  INR 3.53 3.82 1.45  CREATININE 6.66* 7.88* --       Estimated Creatinine Clearance: 9.4 mL/min (A) (by C-G formula based on SCr of 7.88 mg/dL (H)).    Assessment: 61 yom with severe pain in both feet. Planning for angiogram on Tuesday. Patient takes VKA at home for AF. Pharmacy consulted to dose heparin for AF bridge perioperatively.   Goal of Therapy: Heparin level 0.3-0.7 units/ml Monitor platelets by anticoagulation protocol: Yes  Plan: 5/1 HL = 0.53 @ 1455 which is therapeutic. Continue heparin infusion at current rate of 1600 units/hr and order confirmatory HL in 8 hours. CBC with AM labs tomorrow.         5/1 23:00 heparin level 0.51. Continue current regimen. Recheck heparin level and CBC with AM labs.                5/2 AM heparin level 0.59. Continue current regimen. Recheck heparin level and CBC with tomorrow AM labs.  Thank you for this consult.  Fulton Reek, PharmD, BCPS  07/01/16 5:31 AM      Addendum:  Heparin ordered to infuse at 1600 units/hr. Last charted dose on MAR was 1300 units/hr at 0619 this morning. Confirmed with RN that heparin is currently infusing at 1600 units/hr and rate has not been adjusted this shift so HL should reflect 1600 units/hr dose.    Cindi Carbon, PharmD 06/30/16 4:12 PM

## 2016-06-30 NOTE — Progress Notes (Signed)
[facility] - [place] at [facility]   PATIENT NAME: Eddie Myers    MR#:  [id number]  DATE OF BIRTH:  1955/08/15  SUBJECTIVE:  CHIEF COMPLAINT:  No chief complaint on file. BP low. No new issues, waiting for angio today, throat better, spanish interpreter at bedside REVIEW OF SYSTEMS:    Review of Systems  Constitutional: Positive for malaise/fatigue. Negative for chills and fever.  HENT: Negative for sore throat.   Eyes: Negative for blurred vision, double vision and pain.  Respiratory: Negative for cough, hemoptysis, shortness of breath and wheezing.   Cardiovascular: Negative for chest pain, palpitations, orthopnea and leg swelling.  Gastrointestinal: Negative for abdominal pain, constipation, diarrhea, heartburn, nausea and vomiting.  Genitourinary: Negative for dysuria and hematuria.  Musculoskeletal: Negative for back pain and joint pain.  Skin: Negative for rash.  Neurological: Positive for weakness. Negative for sensory change, speech change, focal weakness and headaches.  Endo/Heme/Allergies: Does not bruise/bleed easily.  Psychiatric/Behavioral: Negative for depression. The patient is not nervous/anxious.    DRUG ALLERGIES:  No Known Allergies  VITALS:  Blood pressure (!) 92/43, pulse 65, temperature 97.5 F (36.4 C), temperature source Oral, resp. rate 18, height  (1.727 m), weight 77.2 kg (170 lb 3.1 oz), SpO2 97 %.  PHYSICAL EXAMINATION:   Physical Exam  GENERAL:  61 y.o.-year-old patient lying in the bed with no acute distress.  EYES: Pupils equal, round, reactive to light and accommodation. No scleral icterus. Extraocular muscles intact.  HEENT: Head atraumatic, normocephalic. Oropharynx and nasopharynx clear.  NECK:  Supple, no jugular venous distention. No thyroid enlargement, no tenderness.  LUNGS: Normal breath sounds bilaterally, no wheezing, rales, rhonchi. No use of accessory muscles of respiration.  CARDIOVASCULAR: S1, S2 normal.  No murmurs, rubs, or gallops.  ABDOMEN: Soft, nontender, nondistended. Bowel sounds present. No organomegaly or mass.  EXTREMITIES: Toes bilaterally are discolored with dry gangrene.  NEUROLOGIC: Cranial nerves II through XII are intact. No focal Motor or sensory deficits b/l.   PSYCHIATRIC: The patient is alert and oriented x 3.  SKIN: No obvious rash, lesion, or ulcer.  LABORATORY PANEL:   CBC  Recent Labs Lab 06/29/16 0333  WBC 5.3  HGB 8.8*  HCT 25.7*  PLT 204   ------------------------------------------------------------------------------------------------------------------ Chemistries   Recent Labs Lab 06/24/16 1524  06/29/16 1616  NA 132*  < > 136  K 4.0  < > 3.9  CL 94*  < > 98*  CO2 28  < > 29  GLUCOSE 61*  < > 158*  BUN 26*  < > 14  CREATININE 6.66*  < > 3.69*  CALCIUM 8.3*  < > 8.2*  AST 15  --   --   ALT 25  --   --   ALKPHOS 75  --   --   BILITOT 0.6  --   --   < > = values in this interval not displayed. ------------------------------------------------------------------------------------------------------------------  Cardiac Enzymes No results for input(s): TROPONINI in the last 168 hours. ------------------------------------------------------------------------------------------------------------------  RADIOLOGY:  Dg Foot Complete Left  Result Date: 06/28/2016 CLINICAL DATA:  Gangrene EXAM: LEFT FOOT - COMPLETE 3+ VIEW COMPARISON:  06/20/2016 FINDINGS: No acute fracture or dislocation. Generalized osteopenia. No periosteal reaction or bone destruction. No soft tissue emphysema. Extensive peripheral vascular atherosclerotic disease. IMPRESSION: 1.  No acute abnormality of the left foot. Electronically Signed   By: Elige Ko   On: 06/28/2016 11:33   Dg Foot Complete Right  Result Date: 06/28/2016 CLINICAL DATA:  Gangrene EXAM: RIGHT FOOT COMPLETE - 3+ VIEW COMPARISON:  06/20/2016 FINDINGS: No acute fracture or dislocation. Generalized osteopenia. No  periosteal reaction or bone destruction. No soft tissue emphysema. Extensive peripheral vascular atherosclerotic disease. IMPRESSION: 1. No acute abnormality of the right foot. Electronically Signed   By: Elige Ko   On: 06/28/2016 11:32     ASSESSMENT AND PLAN:   Eddie Myers  is a 61 y.o. male with a known history of End-stage renal disease on hemodialysis, diabetes, hypertension, hyperlipidemia, history of previous CVA, history of coronary artery disease status post bypass who presented to the hospital from his cardiologist's office due to bilateral lower extremity necrotic toes redness and pain.  * Fever likely due to gangrenous toes - switch Ax to po Augmentin to finish 7 days course.  * Bilateral lower extremity cyanosis with necrotic toes Pain control with IV and oral pain medications Status post left lower extremity angiogram with angioplasty of left superficial femoral, popliteal and peroneal arteries. Right lower extremity angiogram scheduled for today  * Subacute right sided vision loss-patient had symptoms about a month ago.  -CT head shows a age-indeterminate right sided thalamic CVA Appreciate Neurology input MRI shows old CVA  * Paroxysmal atrial fibrillation on Coumadin.  Coumadin held for angiogram On heparin drip He may need Lovenox to go home with for a few days if scheduled for surgery by podiatry in a week. If not scheduled for surgery then coumadin  * End-stage renal disease on hemodialysis-patient gets dialysis on Tuesday Thursday Saturday Getting dialysis per nephro  * Hypotension with h/o hypertension-continue lisinopril, Hold carvedilol, Norvasc.  * Diabetes type 2 without complication-continue Lantus, sliding scale insulin.  * Hyperlipidemia-continue atorvastatin  * Throght pain - Tessalon Perles as needed  All the records are reviewed and case discussed with Care Management/Social Worker. Management plans discussed with the patient, nursing  and they are in agreement.  CODE STATUS: FULL CODE  DVT Prophylaxis: SCDs  TOTAL TIME TAKING CARE OF THIS PATIENT: 35 minutes.   POSSIBLE D/C IN 1-2 DAYS, DEPENDING ON CLINICAL CONDITION.  Delfino Lovett M.D on 06/30/2016 at 7:24 AM  Between 7am to 6pm - Pager - [phone]  After 6pm go to [email or web address] - password [email or web address]  [facility] [place] [facility]  Office  [phone]  CC: Primary care physician; Pcp Not In System  Note: This dictation was prepared with Dragon dictation along with smaller phrase technology. Any transcriptional errors that result from this process are unintentional.

## 2016-06-30 NOTE — Progress Notes (Signed)
Inpatient Diabetes Program Recommendations  AACE/ADA: New Consensus Statement on Inpatient Glycemic Control (2015)  Target Ranges:  Prepandial:   less than 140 mg/dL      Peak postprandial:   less than 180 mg/dL (1-2 hours)      Critically ill patients:  140 - 180 mg/dL   Lab Results  Component Value Date   GLUCAP 120 (H) 06/30/2016    Review of Glycemic Control  Results for LUCIA, MCCREADIE (MRN [id number]) as of 06/30/2016 10:17  Ref. Range 06/29/2016 07:35 06/29/2016 11:33 06/29/2016 16:46 06/29/2016 21:14 06/30/2016 07:39  Glucose-Capillary Latest Ref Range: 65 - 99 mg/dL 696 (H) 295 (H) 284 (H) 170 (H) 120 (H)    Diabetes history:Type 2 diabetes Outpatient Diabetes medications: Glucotrol  bid, Lantus 25 units qhs, R insulin 0-10 units tid with meals  Current orders for Inpatient glycemic control: Lantus 22 units qhs,  Novolog 0-9 units tid, Novolog 0-5 units qhs  Inpatient Diabetes Program Recommendations: Agree with current medications for blood sugar management.   Susette Racer, RN, BA, MHA, CDE Diabetes Coordinator Inpatient Diabetes Program  [phone] (Team Pager) [phone] [facility] Office) 06/30/2016 10:18 AM

## 2016-06-30 NOTE — Progress Notes (Signed)
Pharmacist - Prescriber Communication  Augmentin dose has been adjusted from 500 mg/125 mg po Q12H to 500 mg/125 mg po Q24H due to renal function/hemodialysis.  Inioluwa Boulay A. Barrett, [place].D., BCPS Clinical Pharmacist 06/30/2016 09:51

## 2016-06-30 NOTE — Progress Notes (Signed)
4 Days Post-Op  Subjective: Patient seen.  Objective: Vital signs in last 24 hours: Temp:  [97.5 F (36.4 C)-98.5 F (36.9 C)] 98.1 F (36.7 C) (05/01 1327) Pulse Rate:  [65-92] 84 (05/01 1327) Resp:  [11-20] 18 (05/01 0429) BP: (92-134)/(43-65) 102/46 (05/01 1327) SpO2:  [94 %-97 %] 96 % (05/01 1327) Weight:  [77.2 kg (170 lb 3.1 oz)] 77.2 kg (170 lb 3.1 oz) (04/30 1512) Last BM Date: 06/29/16  Intake/Output from previous day: 04/30 0701 - 05/01 0700 In: 410 [P.O.:360; IV Piggyback:50] Out: 400 [Urine:400] Intake/Output this shift: Total I/O In: 113 [I.V.:113] Out: -   Still some dry gangrenous changes noted to the distal left hallux with some cyanosis to the second and third toes. No active drainage or infection noted. The toes are cool to the touch. Cyanosis to the right second toe with some mild extending cellulitis. No advancement.  Lab Results:   Recent Labs  06/28/16 0325 06/29/16 0333  WBC 5.3 5.3  HGB 9.9* 8.8*  HCT 29.1* 25.7*  PLT 225 204   BMET  Recent Labs  06/29/16 1616  NA 136  K 3.9  CL 98*  CO2 29  GLUCOSE 158*  BUN 14  CREATININE 3.69*  CALCIUM 8.2*   PT/INR No results for input(s): LABPROT, INR in the last 72 hours. ABG No results for input(s): PHART, HCO3 in the last 72 hours.  Invalid input(s): PCO2, PO2  Studies/Results: No results found.  Anti-infectives: Anti-infectives    Start     Dose/Rate Route Frequency Ordered Stop   06/30/16 1200  amoxicillin-clavulanate (AUGMENTIN) 500-125 MG per tablet 500 mg     1 tablet Oral Every 24 hours 06/30/16 0951     06/30/16 1000  amoxicillin-clavulanate (AUGMENTIN) 500-125 MG per tablet 500 mg  Status:  Discontinued     1 tablet Oral Every 12 hours 06/30/16 0726 06/30/16 0951   06/28/16 1000  piperacillin-tazobactam (ZOSYN) IVPB 3.375 g  Status:  Discontinued     3.375 g 12.5 mL/hr over 240 Minutes Intravenous Every 12 hours 06/28/16 0736 06/30/16 0726   06/27/16 1200  vancomycin  (VANCOCIN) IVPB 750 mg/150 ml premix  Status:  Discontinued     750 mg 150 mL/hr over 60 Minutes Intravenous Every T-Th-Sa (Hemodialysis) 06/27/16 1122 06/27/16 1127   06/27/16 1200  vancomycin (VANCOCIN) 750 mg in sodium chloride 0.9 % 150 mL IVPB  Status:  Discontinued     750 mg 150 mL/hr over 60 Minutes Intravenous Every T-Th-Sa (Hemodialysis) 06/27/16 1127 06/30/16 0726   06/27/16 1100  vancomycin (VANCOCIN) 1,750 mg in sodium chloride 0.9 % 500 mL IVPB     1,750 mg 250 mL/hr over 120 Minutes Intravenous  Once 06/27/16 1055 06/27/16 1411   06/26/16 0730  ceFAZolin (ANCEF) IVPB 1 g/50 mL premix     1 g 100 mL/hr over 30 Minutes Intravenous  Once 06/26/16 0722 06/26/16 0842      Assessment/Plan: s/p Procedure(s): Abdominal Aortogram w/Lower Extremity (Left) Assessment: Dry gangrenous changes with peripheral vascular disease.   Plan: At this point all of the gangrenous areas on the toes are dry. Vascular procedure Courtney right leg was postponed until tomorrow. At this point no urgency needed for consideration of amputation.  LOS: 6 days    Eddie Myers 06/30/2016

## 2016-06-30 NOTE — Progress Notes (Signed)
ANTICOAGULATION CONSULT NOTE   Pharmacy Consult for heparin Indication: atrial fibrillation  No Known Allergies  Patient Measurements: Height:  (172.7 cm) Weight: 165 lb 5.5 oz (75 kg) IBW/kg (Calculated) : 68.4 Heparin Dosing Weight:   Vital Signs: Temp: 98.1 F (36.7 C) (04/27 0713) Temp Source: Oral (04/27 0713) BP: 137/63 (04/27 1053) Pulse Rate: 84 (04/27 1053)  Labs:  Recent Labs (last 2 labs)    Recent Labs  06/24/16 1524 06/25/16 0434 06/26/16 0444  HGB 9.9* 10.1* --   HCT 29.0* 29.1* --   PLT 324 265 --   LABPROT 36.2* 38.6* 17.8*  INR 3.53 3.82 1.45  CREATININE 6.66* 7.88* --       Estimated Creatinine Clearance: 9.4 mL/min (A) (by C-G formula based on SCr of 7.88 mg/dL (H)).    Assessment: 61 yom with severe pain in both feet. Planning for angiogram on Tuesday. Patient takes VKA at home for AF. Pharmacy consulted to dose heparin for AF bridge perioperatively.   4/29 16:30 Heparin level resulted at 0.36  Goal of Therapy: Heparin level 0.3-0.7 units/ml Monitor platelets by anticoagulation protocol: Yes  Plan: Will continue current orders for heparin 1300 units/hr. Heparin level therapeutic x 2.Will check next Heparin level with AM labs along with CBC.  4/30 @ 0333 HL 0.36 therapeutic. Will continue current rate and recheck HL w/ am labs 5/1.  5/1 AM heparin level <0.1. Infusing as ordered per RN. 2300 units bolus and increase rate to 1600 units/hr. Recheck in 8 hours.  Thank you for this consult.  Thomasene Ripple, PharmD, BCPS Clinical Pharmacist 06/30/2016

## 2016-06-30 NOTE — Progress Notes (Signed)
[facility]  ROUNDING NOTE   Subjective:   Patient had hemodialysis treatment yesterday. Tolerated treatment well. UF of 1 litre.   Angiogram scheduled for today.    Objective:  Vital signs in last 24 hours:  Temp:  [97.5 F (36.4 C)-98.5 F (36.9 C)] 98.5 F (36.9 C) (05/01 0800) Pulse Rate:  [65-98] 66 (05/01 0800) Resp:  [10-20] 18 (05/01 0429) BP: (92-144)/(43-65) 101/52 (05/01 0800) SpO2:  [92 %-97 %] 97 % (05/01 0800) Weight:  [77.2 kg (170 lb 3.1 oz)-78.3 kg (172 lb 9.9 oz)] 77.2 kg (170 lb 3.1 oz) (04/30 1512)  Weight change:  Filed Weights   06/27/16 1845 06/29/16 1200 06/29/16 1512  Weight: 73.1 kg (161 lb 2.5 oz) 78.3 kg (172 lb 9.9 oz) 77.2 kg (170 lb 3.1 oz)    Intake/Output: I/O last 3 completed shifts: In: 637.2 [P.O.:360; I.V.:177.2; IV Piggyback:100] Out: 475 [Urine:475]   Intake/Output this shift:  Total I/O In: 113 [I.V.:113] Out: -   Physical Exam: General: No acute distress  Head: Normocephalic, atraumatic. Moist oral mucosal membranes  Eyes: Anicteric  Neck: Supple, trachea midline  Lungs:  Clear to auscultation, normal effort  Heart: S1S2 no rubs  Abdomen:  Soft, nontender, BS present  Extremities: No peripheral edema. Gangrene left 1st toe, right 2nd toe  Neurologic: Nonfocal, moving all four extremities  Skin: No lesions  Access: LUE AVF    Basic Metabolic Panel:  Recent Labs Lab 06/24/16 1524 06/25/16 0434 06/25/16 0930 06/27/16 1610 06/29/16 1616  NA 132* 133*  --   --  136  K 4.0 4.2  --   --  3.9  CL 94* 95*  --   --  98*  CO2 28 28  --   --  29  GLUCOSE 61* 110*  --   --  158*  BUN 26* 37*  --   --  14  CREATININE 6.66* 7.88*  --   --  3.69*  CALCIUM 8.3* 8.0*  --   --  8.2*  PHOS  --   --  4.6 5.3* 2.8    Liver Function Tests:  Recent Labs Lab 06/24/16 1524 06/29/16 1616  AST 15  --   ALT 25  --   ALKPHOS 75  --   BILITOT 0.6  --   PROT 8.0  --   ALBUMIN 3.2* 2.7*   No results for  input(s): LIPASE, AMYLASE in the last 168 hours. No results for input(s): AMMONIA in the last 168 hours.  CBC:  Recent Labs Lab 06/24/16 1524 06/25/16 0434 06/27/16 1525 06/28/16 0325 06/29/16 0333  WBC 10.4 6.7 7.0 5.3 5.3  HGB 9.9* 10.1* 9.3* 9.9* 8.8*  HCT 29.0* 29.1* 27.3* 29.1* 25.7*  MCV 85.8 86.2 87.8 88.0 88.3  PLT 324 265 211 225 204    Cardiac Enzymes: No results for input(s): CKTOTAL, CKMB, CKMBINDEX, TROPONINI in the last 168 hours.  BNP: Invalid input(s): POCBNP  CBG:  Recent Labs Lab 06/29/16 0735 06/29/16 1133 06/29/16 1646 06/29/16 2114 06/30/16 0739  GLUCAP 105* 195* 166* 170* 120*    Microbiology: Results for orders placed or performed during the hospital encounter of 06/24/16  MRSA PCR Screening     Status: None   Collection Time: 06/24/16  5:05 AM  Result Value Ref Range Status   MRSA by PCR NEGATIVE NEGATIVE Final    Comment:        The GeneXpert MRSA Assay (FDA approved for NASAL specimens only), is one component  of a comprehensive MRSA colonization surveillance program. It is not intended to diagnose MRSA infection nor to guide or monitor treatment for MRSA infections.   CULTURE, BLOOD (ROUTINE X 2) w Reflex to ID Panel     Status: None (Preliminary result)   Collection Time: 06/27/16  7:22 AM  Result Value Ref Range Status   Specimen Description BLOOD RIGHT HAND  Final   Special Requests   Final    BOTTLES DRAWN AEROBIC AND ANAEROBIC Blood Culture adequate volume   Culture NO GROWTH 3 DAYS  Final   Report Status PENDING  Incomplete  CULTURE, BLOOD (ROUTINE X 2) w Reflex to ID Panel     Status: None (Preliminary result)   Collection Time: 06/27/16  7:29 AM  Result Value Ref Range Status   Specimen Description BLOOD RIGHT ASSIST CONTROL  Final   Special Requests   Final    BOTTLES DRAWN AEROBIC AND ANAEROBIC Blood Culture adequate volume   Culture NO GROWTH 3 DAYS  Final   Report Status PENDING  Incomplete    Coagulation  Studies: No results for input(s): LABPROT, INR in the last 72 hours.  Urinalysis: No results for input(s): COLORURINE, LABSPEC, PHURINE, GLUCOSEU, HGBUR, BILIRUBINUR, KETONESUR, PROTEINUR, UROBILINOGEN, NITRITE, LEUKOCYTESUR in the last 72 hours.  Invalid input(s): APPERANCEUR    Imaging: Dg Foot Complete Left  Result Date: 06/28/2016 CLINICAL DATA:  Gangrene EXAM: LEFT FOOT - COMPLETE 3+ VIEW COMPARISON:  06/20/2016 FINDINGS: No acute fracture or dislocation. Generalized osteopenia. No periosteal reaction or bone destruction. No soft tissue emphysema. Extensive peripheral vascular atherosclerotic disease. IMPRESSION: 1.  No acute abnormality of the left foot. Electronically Signed   By: Elige Ko   On: 06/28/2016 11:33   Dg Foot Complete Right  Result Date: 06/28/2016 CLINICAL DATA:  Gangrene EXAM: RIGHT FOOT COMPLETE - 3+ VIEW COMPARISON:  06/20/2016 FINDINGS: No acute fracture or dislocation. Generalized osteopenia. No periosteal reaction or bone destruction. No soft tissue emphysema. Extensive peripheral vascular atherosclerotic disease. IMPRESSION: 1. No acute abnormality of the right foot. Electronically Signed   By: Elige Ko   On: 06/28/2016 11:32     Medications:   . sodium chloride    . sodium chloride    . heparin 1,300 Units/hr (06/29/16 8295)   . amoxicillin-clavulanate  1 tablet Oral Q12H  . aspirin EC  81 mg Oral Daily  . atorvastatin  80 mg Oral QPM  . clopidogrel  75 mg Oral Daily  . epoetin (EPOGEN/PROCRIT) injection  4,000 Units Intravenous Q T,Th,Sa-HD  . ferric citrate  420 mg Oral TID WC  . heparin  2,300 Units Intravenous Once  . insulin aspart  0-5 Units Subcutaneous QHS  . insulin aspart  0-9 Units Subcutaneous TID WC  . insulin glargine  22 Units Subcutaneous QHS  . lisinopril  2.5 mg Oral Daily  . living well with diabetes book   Does not apply Once  . Melatonin  5 mg Oral QHS  . pneumococcal 23 valent vaccine  0.5 mL Intramuscular Tomorrow-1000   . senna  1 tablet Oral Daily  . tamsulosin  0.4 mg Oral QHS   sodium chloride, sodium chloride, acetaminophen **OR** acetaminophen, alteplase, benzonatate, heparin, lidocaine (PF), lidocaine-prilocaine, morphine injection, nitroGLYCERIN, ondansetron **OR** ondansetron (ZOFRAN) IV, oxyCODONE, pentafluoroprop-tetrafluoroeth  Assessment/ Plan:  61 y.o. Hispanic male ESRD on HD TTS, coronary artery disease, diabetes mellitus type 2, hyperlipidemia, hypertension, history of CVA, anemia chronic kidney disease, secondary hyperparathyroidism who presents with gangrenous changes on toes of  both feet.  [facility] TTS [address] Left AVF   1.  ESRD on HD TTS:  Hemodialysis today seeing he is scheduled for vascular procedure tomorrow. Dialysis need evaluated daily. Next treatment for Thursday.   2.  Anemia chronic kidney disease: hemoglobin 8.8 - Epogen with dialysis.  3.  Secondary hyperparathyroidism.  Phosphorus at goal - Auryxia with meals.   4.  Hypertension: hypotensive on treatment - Continue amlodipine,carvedilol, and lisinopril.  5.   Peripheral arterial disease:  Scheduled for right lower extremity angiogram by Dr. Gilda Crease on 5/1   LOS: 6 Dez Stauffer 5/1/20189:45 AM

## 2016-07-01 ENCOUNTER — Other Ambulatory Visit (INDEPENDENT_AMBULATORY_CARE_PROVIDER_SITE_OTHER): Payer: Self-pay | Admitting: Vascular Surgery

## 2016-07-01 ENCOUNTER — Encounter
Admission: AD | Disposition: A | Discharge status: Skilled Nursing Facility | Payer: Self-pay | Source: Ambulatory Visit | Attending: Internal Medicine

## 2016-07-01 DIAGNOSIS — I70263 Atherosclerosis of native arteries of extremities with gangrene, bilateral legs: Secondary | ICD-10-CM

## 2016-07-01 HISTORY — PX: PERIPHERAL VASCULAR BALLOON ANGIOPLASTY: CATH118281

## 2016-07-01 LAB — BASIC METABOLIC PANEL
Anion gap: 9 (ref 5–15)
BUN: 28 mg/dL — ABNORMAL HIGH (ref 6–20)
CALCIUM: 8.1 mg/dL — AB (ref 8.9–10.3)
CHLORIDE: 97 mmol/L — AB (ref 101–111)
CO2: 28 mmol/L (ref 22–32)
CREATININE: 7.51 mg/dL — AB (ref 0.61–1.24)
GFR calc non Af Amer: 7 mL/min — ABNORMAL LOW (ref >60)
GFR, EST AFRICAN AMERICAN: 8 mL/min — AB (ref >60)
GLUCOSE: 73 mg/dL (ref 65–99)
Potassium: 4.4 mmol/L (ref 3.5–5.1)
Sodium: 134 mmol/L — ABNORMAL LOW (ref 135–145)

## 2016-07-01 LAB — HEPARIN LEVEL (UNFRACTIONATED)
HEPARIN UNFRACTIONATED: 0.51 [IU]/mL (ref 0.30–0.70)
Heparin Unfractionated: 0.59 IU/mL (ref 0.30–0.70)

## 2016-07-01 LAB — CBC
HCT: 25.6 % — ABNORMAL LOW (ref 40.0–52.0)
HEMOGLOBIN: 8.6 g/dL — AB (ref 13.0–18.0)
MCH: 30 pg (ref 26.0–34.0)
MCHC: 33.7 g/dL (ref 32.0–36.0)
MCV: 89.1 fL (ref 80.0–100.0)
Platelets: 241 10*3/uL (ref 150–440)
RBC: 2.88 MIL/uL — ABNORMAL LOW (ref 4.40–5.90)
RDW: 14.6 % — AB (ref 11.5–14.5)
WBC: 8 10*3/uL (ref 3.8–10.6)

## 2016-07-01 LAB — GLUCOSE, CAPILLARY
GLUCOSE-CAPILLARY: 77 mg/dL (ref 65–99)
GLUCOSE-CAPILLARY: 66 mg/dL (ref 65–99)
GLUCOSE-CAPILLARY: 75 mg/dL (ref 65–99)
GLUCOSE-CAPILLARY: 115 mg/dL — AB (ref 65–99)
GLUCOSE-CAPILLARY: 121 mg/dL — AB (ref 65–99)
Glucose-Capillary: 118 mg/dL — ABNORMAL HIGH (ref 65–99)

## 2016-07-01 LAB — PROTIME-INR
INR: 1.05
Prothrombin Time: 13.7 seconds (ref 11.4–15.2)

## 2016-07-01 SURGERY — PERIPHERAL VASCULAR BALLOON ANGIOPLASTY
Anesthesia: Moderate Sedation | Laterality: Left

## 2016-07-01 MED ORDER — HEPARIN (PORCINE) IN NACL 2-0.9 UNIT/ML-% IJ SOLN
INTRAMUSCULAR | Status: AC
  Filled 2016-07-01: qty 1000

## 2016-07-01 MED ORDER — MIDAZOLAM HCL 5 MG/5ML IJ SOLN
INTRAMUSCULAR | Status: AC
  Filled 2016-07-01: qty 5

## 2016-07-01 MED ORDER — SODIUM CHLORIDE 0.9 % IV SOLN
INTRAVENOUS | Status: DC
  Administered 2016-07-01: 12:00:00 via INTRAVENOUS

## 2016-07-01 MED ORDER — WARFARIN - PHARMACIST DOSING INPATIENT
Freq: Every day | Status: DC
  Administered 2016-07-01 – 2016-07-02 (×2)

## 2016-07-01 MED ORDER — CEFAZOLIN SODIUM-DEXTROSE 1-4 GM/50ML-% IV SOLN
INTRAVENOUS | Status: AC
  Filled 2016-07-01: qty 50

## 2016-07-01 MED ORDER — FENTANYL CITRATE (PF) 100 MCG/2ML IJ SOLN
INTRAMUSCULAR | Status: DC | PRN
  Administered 2016-07-01: 50 ug via INTRAVENOUS

## 2016-07-01 MED ORDER — DEXTROSE 50 % IV SOLN
INTRAVENOUS | Status: AC
  Administered 2016-07-01: 25 mL via INTRAVENOUS
  Filled 2016-07-01: qty 50

## 2016-07-01 MED ORDER — HEPARIN SODIUM (PORCINE) 1000 UNIT/ML IJ SOLN
INTRAMUSCULAR | Status: AC
  Filled 2016-07-01: qty 1

## 2016-07-01 MED ORDER — IOPAMIDOL (ISOVUE-300) INJECTION 61%
INTRAVENOUS | Status: DC | PRN
  Administered 2016-07-01: 70 mL via INTRAVENOUS

## 2016-07-01 MED ORDER — DEXTROSE 50 % IV SOLN
25.0000 mL | Freq: Once | INTRAVENOUS | Status: AC
  Administered 2016-07-01: 25 mL via INTRAVENOUS

## 2016-07-01 MED ORDER — FENTANYL CITRATE (PF) 100 MCG/2ML IJ SOLN
INTRAMUSCULAR | Status: AC
  Filled 2016-07-01: qty 2

## 2016-07-01 MED ORDER — MIDAZOLAM HCL 2 MG/2ML IJ SOLN
INTRAMUSCULAR | Status: DC | PRN
  Administered 2016-07-01: 2 mg via INTRAVENOUS

## 2016-07-01 MED ORDER — WARFARIN SODIUM 5 MG PO TABS
5.0000 mg | ORAL_TABLET | ORAL | Status: DC
  Administered 2016-07-01: 5 mg via ORAL
  Filled 2016-07-01: qty 1

## 2016-07-01 MED ORDER — WARFARIN SODIUM 2.5 MG PO TABS: 2.5000 mg | ORAL_TABLET | ORAL | Status: DC

## 2016-07-01 MED ORDER — LIDOCAINE HCL (PF) 1 % IJ SOLN
INTRAMUSCULAR | Status: AC
  Filled 2016-07-01: qty 30

## 2016-07-01 MED ORDER — DEXTROSE-NACL 5-0.9 % IV SOLN
INTRAVENOUS | Status: DC
  Administered 2016-07-01 – 2016-07-02 (×2): via INTRAVENOUS

## 2016-07-01 SURGICAL SUPPLY — 11 items
CATH PIG 70CM (CATHETERS) ×3 IMPLANT
COVER PROBE U/S 5X48 (MISCELLANEOUS) ×3 IMPLANT
DEVICE CLOSURE MYNXGRIP 5F (Vascular Products) ×3 IMPLANT
DEVICE PRESTO INFLATION (MISCELLANEOUS) ×3 IMPLANT
GLIDEWIRE ANGLED SS 035X260CM (WIRE) ×3 IMPLANT
NEEDLE ENTRY 21GA 7CM ECHOTIP (NEEDLE) ×3 IMPLANT
PACK ANGIOGRAPHY (CUSTOM PROCEDURE TRAY) ×3 IMPLANT
SET INTRO CAPELLA COAXIAL (SET/KITS/TRAYS/PACK) ×3 IMPLANT
SHEATH BRITE TIP 5FRX11 (SHEATH) ×3 IMPLANT
TOWEL OR 17X26 4PK STRL BLUE (TOWEL DISPOSABLE) ×3 IMPLANT
WIRE J 3MM .035X145CM (WIRE) ×3 IMPLANT

## 2016-07-01 NOTE — OR Nursing (Signed)
Dr. Gilda Crease in at bedside speaking with pt. And daughter with assist of interpretor. Pt. Verbalizing understanding. No acute distress.

## 2016-07-01 NOTE — Progress Notes (Signed)
[facility]  ROUNDING NOTE   Subjective:   History taken with assistance of Spanish interpeter. No complaints. Angiogram for today.    Objective:  Vital signs in last 24 hours:  Temp:  [98.1 F (36.7 C)-98.5 F (36.9 C)] 98.5 F (36.9 C) (05/02 1130) Pulse Rate:  [69-84] 74 (05/02 1115) Resp:  [16-34] 34 (05/02 1130) BP: (100-131)/(45-61) 131/59 (05/02 1115) SpO2:  [93 %-96 %] 93 % (05/02 1115) Weight:  [77.1 kg (170 lb)] 77.1 kg (170 lb) (05/02 1115)  Weight change:  Filed Weights   06/29/16 1200 06/29/16 1512 07/01/16 1115  Weight: 78.3 kg (172 lb 9.9 oz) 77.2 kg (170 lb 3.1 oz) 77.1 kg (170 lb)    Intake/Output: I/O last 3 completed shifts: In: 163 [I.V.:113; IV Piggyback:50] Out: 400 [Urine:400]   Intake/Output this shift:  No intake/output data recorded.  Physical Exam: General: No acute distress  Head: Normocephalic, atraumatic. Moist oral mucosal membranes  Eyes: Anicteric  Neck: Supple, trachea midline  Lungs:  Clear to auscultation, normal effort  Heart: S1S2 no rubs  Abdomen:  Soft, nontender, BS present  Extremities: No peripheral edema. Gangrene left 1st toe, right 2nd toe  Neurologic: Nonfocal, moving all four extremities  Skin: No lesions  Access: LUE AVF    Basic Metabolic Panel:  Recent Labs Lab 06/24/16 1524 06/25/16 0434 06/25/16 0930 06/27/16 1610 06/29/16 1616 07/01/16 0440  NA 132* 133*  --   --  136 134*  K 4.0 4.2  --   --  3.9 4.4  CL 94* 95*  --   --  98* 97*  CO2 28 28  --   --  29 28  GLUCOSE 61* 110*  --   --  158* 73  BUN 26* 37*  --   --  14 28*  CREATININE 6.66* 7.88*  --   --  3.69* 7.51*  CALCIUM 8.3* 8.0*  --   --  8.2* 8.1*  PHOS  --   --  4.6 5.3* 2.8  --     Liver Function Tests:  Recent Labs Lab 06/24/16 1524 06/29/16 1616  AST 15  --   ALT 25  --   ALKPHOS 75  --   BILITOT 0.6  --   PROT 8.0  --   ALBUMIN 3.2* 2.7*   No results for input(s): LIPASE, AMYLASE in the last 168 hours. No  results for input(s): AMMONIA in the last 168 hours.  CBC:  Recent Labs Lab 06/25/16 0434 06/27/16 1525 06/28/16 0325 06/29/16 0333 07/01/16 0440  WBC 6.7 7.0 5.3 5.3 8.0  HGB 10.1* 9.3* 9.9* 8.8* 8.6*  HCT 29.1* 27.3* 29.1* 25.7* 25.6*  MCV 86.2 87.8 88.0 88.3 89.1  PLT 265 211 225 204 241    Cardiac Enzymes: No results for input(s): CKTOTAL, CKMB, CKMBINDEX, TROPONINI in the last 168 hours.  BNP: Invalid input(s): POCBNP  CBG:  Recent Labs Lab 06/30/16 1141 06/30/16 1638 06/30/16 2137 07/01/16 0732 07/01/16 1134  GLUCAP 136* 125* 126* 77 66    Microbiology: Results for orders placed or performed during the hospital encounter of 06/24/16  MRSA PCR Screening     Status: None   Collection Time: 06/24/16  5:05 AM  Result Value Ref Range Status   MRSA by PCR NEGATIVE NEGATIVE Final    Comment:        The GeneXpert MRSA Assay (FDA approved for NASAL specimens only), is one component of a comprehensive MRSA colonization surveillance program. It is  not intended to diagnose MRSA infection nor to guide or monitor treatment for MRSA infections.   CULTURE, BLOOD (ROUTINE X 2) w Reflex to ID Panel     Status: None (Preliminary result)   Collection Time: 06/27/16  7:22 AM  Result Value Ref Range Status   Specimen Description BLOOD RIGHT HAND  Final   Special Requests   Final    BOTTLES DRAWN AEROBIC AND ANAEROBIC Blood Culture adequate volume   Culture NO GROWTH 4 DAYS  Final   Report Status PENDING  Incomplete  CULTURE, BLOOD (ROUTINE X 2) w Reflex to ID Panel     Status: None (Preliminary result)   Collection Time: 06/27/16  7:29 AM  Result Value Ref Range Status   Specimen Description BLOOD RIGHT ASSIST CONTROL  Final   Special Requests   Final    BOTTLES DRAWN AEROBIC AND ANAEROBIC Blood Culture adequate volume   Culture NO GROWTH 4 DAYS  Final   Report Status PENDING  Incomplete    Coagulation Studies: No results for input(s): LABPROT, INR in the last  72 hours.  Urinalysis: No results for input(s): COLORURINE, LABSPEC, PHURINE, GLUCOSEU, HGBUR, BILIRUBINUR, KETONESUR, PROTEINUR, UROBILINOGEN, NITRITE, LEUKOCYTESUR in the last 72 hours.  Invalid input(s): APPERANCEUR    Imaging: No results found.   Medications:   . [MAR Hold] sodium chloride    . [MAR Hold] sodium chloride    . [MAR Hold]  ceFAZolin (ANCEF) IV Stopped (07/01/16 0500)  . heparin 1,600 Units/hr (06/30/16 2356)   . [MAR Hold] amoxicillin-clavulanate  1 tablet Oral Q24H  . [MAR Hold] aspirin EC  81 mg Oral Daily  . [MAR Hold] atorvastatin  80 mg Oral QPM  . [MAR Hold] clopidogrel  75 mg Oral Daily  . [MAR Hold] epoetin (EPOGEN/PROCRIT) injection  4,000 Units Intravenous Q T,Th,Sa-HD  . [MAR Hold] ferric citrate  420 mg Oral TID WC  . [MAR Hold] heparin  2,300 Units Intravenous Once  . [MAR Hold] insulin aspart  0-5 Units Subcutaneous QHS  . [MAR Hold] insulin aspart  0-9 Units Subcutaneous TID WC  . [MAR Hold] insulin glargine  22 Units Subcutaneous QHS  . [MAR Hold] lisinopril  2.5 mg Oral Daily  . [MAR Hold] living well with diabetes book   Does not apply Once  . [MAR Hold] Melatonin  5 mg Oral QHS  . [MAR Hold] pneumococcal 23 valent vaccine  0.5 mL Intramuscular Tomorrow-1000  . [MAR Hold] senna  1 tablet Oral Daily  . [MAR Hold] tamsulosin  0.4 mg Oral QHS   [MAR Hold] sodium chloride, [MAR Hold] sodium chloride, [MAR Hold] acetaminophen **OR** [MAR Hold] acetaminophen, [MAR Hold] alteplase, [MAR Hold] benzonatate, [MAR Hold] heparin, [MAR Hold] lidocaine (PF), [MAR Hold] lidocaine-prilocaine, [MAR Hold] nitroGLYCERIN, [MAR Hold] ondansetron **OR** [MAR Hold] ondansetron (ZOFRAN) IV, [MAR Hold] oxyCODONE, [MAR Hold] pentafluoroprop-tetrafluoroeth  Assessment/ Plan:  61 y.o. Hispanic male ESRD on HD TTS, coronary artery disease, diabetes mellitus type 2, hyperlipidemia, hypertension, history of CVA, anemia chronic kidney disease, secondary hyperparathyroidism  who presents with gangrenous changes on toes of both feet.  [facility] TTS [address] Left AVF   1.  ESRD on HD TTS: Next treatment for Thursday.   2.  Anemia chronic kidney disease:   - Epogen with dialysis.  3.  Secondary hyperparathyroidism.  Phosphorus at goal - Auryxia with meals.   4.  Hypertension:  - Continue amlodipine,carvedilol, and lisinopril.  5.   Peripheral arterial disease:  Scheduled for right lower  extremity angiogram by Dr. Gilda Crease on 5/2   LOS: 7 Clayton, Megen Madewell 5/2/201811:47 AM

## 2016-07-01 NOTE — Progress Notes (Signed)
ANTICOAGULATION CONSULT NOTE - Initial Consult  Pharmacy Consult for warfarin Indication: AF  No Known Allergies  Patient Measurements: Height:  (172.7 cm) Weight: 170 lb (77.1 kg) IBW/kg (Calculated) : 68.4 Heparin Dosing Weight:   Vital Signs: Temp: 98.5 F (36.9 C) (05/02 1130) Temp Source: Oral (05/02 0418) BP: 131/59 (05/02 1115) Pulse Rate: 74 (05/02 1115)  Labs:  Recent Labs  06/29/16 0333 06/29/16 1616  06/30/16 1455 06/30/16 2314 07/01/16 0440  HGB 8.8*  --   --   --   --  8.6*  HCT 25.7*  --   --   --   --  25.6*  PLT 204  --   --   --   --  241  HEPARINUNFRC 0.36  --   < > 0.53 0.51 0.59  CREATININE  --  3.69*  --   --   --  7.51*  < > = values in this interval not displayed.  Estimated Creatinine Clearance: 9.9 mL/min (A) (by C-G formula based on SCr of 7.51 mg/dL (H)).   Medical History: Past Medical History:  Diagnosis Date  . Anginal pain (HCC)   . Coronary artery disease   . Diabetes mellitus without complication (HCC)   . Dialysis patient (HCC)    Tues, Thurs, Sat  . Dyspnea    with exertion  . Elevated lipids   . Enlarged prostate   . ESRD (end stage renal disease) (HCC)    dialysis M-W-F  . Hypertension   . Myocardial infarction (HCC) 08/2015  . Stroke [facility])    Lt side this year July    Medications:  Infusions:  . [MAR Hold] sodium chloride    . [MAR Hold] sodium chloride    . sodium chloride 10 mL/hr at 07/01/16 1150  . [MAR Hold]  ceFAZolin (ANCEF) IV Stopped (07/01/16 0500)  . heparin 1,600 Units/hr (06/30/16 2356)    Assessment: 61 yom cc necrotic toes, followed by nephrology for ESRD HD, vascular surgery for angiogram (today), and hospitalist. Admitted with supratherapeutic INR, bridged with UFH for procedure. INR reversed with Vitamin K 2 mg subcutaneously on 4/26. Pharmacy consulted to resume VKA post angio.   Goal of Therapy:  INR 2-3 Monitor platelets by anticoagulation protocol: Yes   Plan:  Check INR (last  4/27) before dose. Anticipate resuming home dose of warfarin 5 mg po daily on Sunday, Tuesday, Wednesday, Thursday, and Saturday; warfarin 2.5 mg po daily on Monday and Friday. Pharmacy will continue to follow and adjust as needed to maintain INR 2 to 3.   Carola Frost, Pharm.D., BCPS Clinical Pharmacist 07/01/2016,2:20 PM

## 2016-07-01 NOTE — Progress Notes (Signed)
BG: 65; pt. Asymptomatic. Pt. Med. With 25 GM Dextrose IVP by E. Maynor, RN. Pt. Tolerated well. Denies c/o SOB, Nausea , dizziness, HA. Pt. Daughter at bedside with pt.

## 2016-07-01 NOTE — Progress Notes (Signed)
[facility] - [place] [facility]   PATIENT NAME: Eddie Myers    MR#:  [id number]  DATE OF BIRTH:  [birth year]  SUBJECTIVE:  CHIEF COMPLAINT:  No chief complaint on file. Just returned from Angio. spanish interpreter at bedside. OR nurse reported hypoglycemia episodes while there, no complaints now, hungry REVIEW OF SYSTEMS:    Review of Systems  Constitutional: Positive for malaise/fatigue. Negative for chills and fever.  HENT: Negative for sore throat.   Eyes: Negative for blurred vision, double vision and pain.  Respiratory: Negative for cough, hemoptysis, shortness of breath and wheezing.   Cardiovascular: Negative for chest pain, palpitations, orthopnea and leg swelling.  Gastrointestinal: Negative for abdominal pain, constipation, diarrhea, heartburn, nausea and vomiting.  Genitourinary: Negative for dysuria and hematuria.  Musculoskeletal: Negative for back pain and joint pain.  Skin: Negative for rash.  Neurological: Positive for weakness. Negative for sensory change, speech change, focal weakness and headaches.  Endo/Heme/Allergies: Does not bruise/bleed easily.  Psychiatric/Behavioral: Negative for depression. The patient is not nervous/anxious.    DRUG ALLERGIES:  No Known Allergies  VITALS:  Blood pressure 120/66, pulse 70, temperature 98.5 F (36.9 C), resp. rate 14, height  (1.727 m), weight 77.1 kg (170 lb), SpO2 95 %.  PHYSICAL EXAMINATION:   Physical Exam  GENERAL:  61 y.o.-year-old patient lying in the bed with no acute distress.  EYES: Pupils equal, round, reactive to light and accommodation. No scleral icterus. Extraocular muscles intact.  HEENT: Head atraumatic, normocephalic. Oropharynx and nasopharynx clear.  NECK:  Supple, no jugular venous distention. No thyroid enlargement, no tenderness.  LUNGS: Normal breath sounds bilaterally, no wheezing, rales, rhonchi. No use of accessory muscles of respiration.  CARDIOVASCULAR: S1, S2  normal. No murmurs, rubs, or gallops.  ABDOMEN: Soft, nontender, nondistended. Bowel sounds present. No organomegaly or mass.  EXTREMITIES: Toes bilaterally are discolored with dry gangrene.  NEUROLOGIC: Cranial nerves II through XII are intact. No focal Motor or sensory deficits b/l.   PSYCHIATRIC: The patient is alert and oriented x 3.  SKIN: No obvious rash, lesion, or ulcer.  LABORATORY PANEL:   CBC  Recent Labs Lab 07/01/16 0440  WBC 8.0  HGB 8.6*  HCT 25.6*  PLT 241   ------------------------------------------------------------------------------------------------------------------ Chemistries   Recent Labs Lab 07/01/16 0440  NA 134*  K 4.4  CL 97*  CO2 28  GLUCOSE 73  BUN 28*  CREATININE 7.51*  CALCIUM 8.1*   ------------------------------------------------------------------------------------------------------------------  Cardiac Enzymes No results for input(s): TROPONINI in the last 168 hours. ------------------------------------------------------------------------------------------------------------------  RADIOLOGY:  No results found.   ASSESSMENT AND PLAN:  Eddie Myers  is a 61 y.o. male with a known history of End-stage renal disease on hemodialysis, diabetes, hypertension, hyperlipidemia, history of previous CVA, history of coronary artery disease status post bypass who presented to the hospital from his cardiologist's office due to bilateral lower extremity necrotic toes redness and pain.  * Fever likely due to gangrenous toes - PO Augmentin to finish 7 days course.  * Bilateral lower extremity cyanosis with necrotic toes Pain control with IV and oral pain medications Status post left lower extremity angiogram with angioplasty of left superficial femoral, popliteal and peroneal arteries. - s/p Right lower extremity angiogram - planned for rt common femoral endarterectomy with distal angioplasty this Friday. Will request cardio clearance per vascular    * Subacute right sided vision loss-patient had symptoms about a month ago.  -CT head shows a age-indeterminate right sided thalamic CVA Appreciate Neurology  input MRI shows old CVA  * Paroxysmal atrial fibrillation on Coumadin.  Coumadin held for angiogram On heparin drip He may need Lovenox to go home with for a few days if scheduled for surgery by podiatry in a week. If not scheduled for surgery then coumadin  * End-stage renal disease on hemodialysis-patient gets dialysis on Tuesday Thursday Saturday Getting dialysis per nephro  * Hypotension with h/o hypertension-continue lisinopril, Hold carvedilol, Norvasc.  * Hypoglycemia with h/o Diabetes type 2 without complication-stop lantus, continue SSI, start d5ns  * Hyperlipidemia-continue atorvastatin  * Throght pain - Tessalon Perles as needed    All the records are reviewed and case discussed with Care Management/Social Worker. Management plans discussed with the patient, nursing and they are in agreement.  CODE STATUS: FULL CODE  DVT Prophylaxis: SCDs  TOTAL TIME TAKING CARE OF THIS PATIENT: 35 minutes.   POSSIBLE D/C IN 1-2 DAYS, DEPENDING ON CLINICAL CONDITION.  Delfino Lovett M.D on 07/01/2016 at 3:33 PM  Between 7am to 6pm - Pager - [phone]  After 6pm go to [email or web address] - password [email or web address]  [facility]  Office  [phone]  CC: Primary care physician; Pcp Not In System  Note: This dictation was prepared with Dragon dictation along with smaller phrase technology. Any transcriptional errors that result from this process are unintentional.

## 2016-07-01 NOTE — Progress Notes (Signed)
Interpreter up to translate when and if patient has questions regarding consent for ordered procedure.  Discrepancies between what patient understood and what was ordered.  Consent not signed, awaiting physician to reiterate explanation of services.

## 2016-07-02 DIAGNOSIS — I25118 Atherosclerotic heart disease of native coronary artery with other forms of angina pectoris: Secondary | ICD-10-CM

## 2016-07-02 DIAGNOSIS — E1165 Type 2 diabetes mellitus with hyperglycemia: Secondary | ICD-10-CM

## 2016-07-02 DIAGNOSIS — Z951 Presence of aortocoronary bypass graft: Secondary | ICD-10-CM

## 2016-07-02 DIAGNOSIS — I639 Cerebral infarction, unspecified: Secondary | ICD-10-CM

## 2016-07-02 DIAGNOSIS — I96 Gangrene, not elsewhere classified: Secondary | ICD-10-CM

## 2016-07-02 LAB — DIFFERENTIAL
BASOS PCT: 0 %
Basophils Absolute: 0 10*3/uL (ref 0–0.1)
EOS ABS: 0.1 10*3/uL (ref 0–0.7)
EOS PCT: 1 %
Lymphocytes Relative: 7 %
Lymphs Abs: 0.5 10*3/uL — ABNORMAL LOW (ref 1.0–3.6)
MONO ABS: 0.6 10*3/uL (ref 0.2–1.0)
Monocytes Relative: 7 %
NEUTROS PCT: 85 %
Neutro Abs: 6.8 10*3/uL — ABNORMAL HIGH (ref 1.4–6.5)

## 2016-07-02 LAB — CBC
HCT: 24 % — ABNORMAL LOW (ref 40.0–52.0)
HCT: 26.3 % — ABNORMAL LOW (ref 40.0–52.0)
HEMOGLOBIN: 8.1 g/dL — AB (ref 13.0–18.0)
Hemoglobin: 8.6 g/dL — ABNORMAL LOW (ref 13.0–18.0)
MCH: 30 pg (ref 26.0–34.0)
MCH: 29.2 pg (ref 26.0–34.0)
MCHC: 33.7 g/dL (ref 32.0–36.0)
MCHC: 32.7 g/dL (ref 32.0–36.0)
MCV: 89 fL (ref 80.0–100.0)
MCV: 89.3 fL (ref 80.0–100.0)
PLATELETS: 216 10*3/uL (ref 150–440)
PLATELETS: 240 10*3/uL (ref 150–440)
RBC: 2.69 MIL/uL — ABNORMAL LOW (ref 4.40–5.90)
RBC: 2.94 MIL/uL — AB (ref 4.40–5.90)
RDW: 14.9 % — AB (ref 11.5–14.5)
RDW: 15 % — AB (ref 11.5–14.5)
WBC: 7 10*3/uL (ref 3.8–10.6)
WBC: 8.1 10*3/uL (ref 3.8–10.6)

## 2016-07-02 LAB — CULTURE, BLOOD (ROUTINE X 2)
CULTURE: NO GROWTH
CULTURE: NO GROWTH
Special Requests: ADEQUATE
Special Requests: ADEQUATE

## 2016-07-02 LAB — GLUCOSE, CAPILLARY
Glucose-Capillary: 148 mg/dL — ABNORMAL HIGH (ref 65–99)
Glucose-Capillary: 115 mg/dL — ABNORMAL HIGH (ref 65–99)
Glucose-Capillary: 165 mg/dL — ABNORMAL HIGH (ref 65–99)
Glucose-Capillary: 215 mg/dL — ABNORMAL HIGH (ref 65–99)

## 2016-07-02 LAB — PREPARE RBC (CROSSMATCH)

## 2016-07-02 LAB — PROTIME-INR
INR: 1.14
PROTHROMBIN TIME: 14.7 s (ref 11.4–15.2)

## 2016-07-02 LAB — HEPARIN LEVEL (UNFRACTIONATED): HEPARIN UNFRACTIONATED: 0.34 [IU]/mL (ref 0.30–0.70)

## 2016-07-02 MED ORDER — EPOETIN ALFA 10000 UNIT/ML IJ SOLN
10000.0000 [IU] | Freq: Once | INTRAMUSCULAR | Status: AC
  Administered 2016-07-02: 10000 [IU] via SUBCUTANEOUS

## 2016-07-02 MED ORDER — CEFAZOLIN SODIUM-DEXTROSE 1-4 GM/50ML-% IV SOLN
1.0000 g | INTRAVENOUS | Status: AC
  Administered 2016-07-03: 1 g via INTRAVENOUS
  Filled 2016-07-02: qty 50

## 2016-07-02 MED ORDER — BISACODYL 10 MG RE SUPP: 10.0000 mg | Freq: Two times a day (BID) | RECTAL | Status: DC | PRN

## 2016-07-02 MED ORDER — BISACODYL 10 MG RE SUPP
10.0000 mg | Freq: Every day | RECTAL | Status: DC | PRN
  Administered 2016-07-02 – 2016-07-07 (×2): 10 mg via RECTAL
  Filled 2016-07-02 (×2): qty 1

## 2016-07-02 MED ORDER — DIPHENHYDRAMINE HCL 25 MG PO CAPS
25.0000 mg | ORAL_CAPSULE | ORAL | Status: AC
  Administered 2016-07-02: 25 mg via ORAL
  Filled 2016-07-02: qty 1

## 2016-07-02 MED ORDER — POLYETHYLENE GLYCOL 3350 17 G PO PACK
17.0000 g | PACK | Freq: Every day | ORAL | Status: DC | PRN
  Administered 2016-07-02: 17 g via ORAL
  Filled 2016-07-02: qty 1

## 2016-07-02 NOTE — Progress Notes (Signed)
[facility]  ROUNDING NOTE   Subjective:   History taken with assistance of Spanish interpeter. Seen and examined on hemodialysis. Tolerating treatment well. Complains of constipation.     HEMODIALYSIS FLOWSHEET:  Blood Flow Rate (mL/min): 400 mL/min Arterial Pressure (mmHg): -150 mmHg Venous Pressure (mmHg): 170 mmHg Transmembrane Pressure (mmHg): 60 mmHg Ultrafiltration Rate (mL/min): 500 mL/min Dialysate Flow Rate (mL/min): 600 ml/min Conductivity: Machine : 13.7 Conductivity: Machine : 13.7 Dialysis Fluid Bolus: Normal Saline Bolus Amount (mL): 250 mL Dialysate Change: 2K Intra-Hemodialysis Comments: resting comfortably    Objective:  Vital signs in last 24 hours:  Temp:  [97.7 F (36.5 C)-98.5 F (36.9 C)] 98 F (36.7 C) (05/03 0503) Pulse Rate:  [51-74] 73 (05/03 0503) Resp:  [12-20] 20 (05/03 0503) BP: (120-148)/(43-70) 127/54 (05/03 0503) SpO2:  [83 %-96 %] 93 % (05/03 0503) Weight:  [77.1 kg (170 lb)] 77.1 kg (170 lb) (05/02 1115)  Weight change:  Filed Weights   06/29/16 1200 06/29/16 1512 07/01/16 1115  Weight: 78.3 kg (172 lb 9.9 oz) 77.2 kg (170 lb 3.1 oz) 77.1 kg (170 lb)    Intake/Output: I/O last 3 completed shifts: In: 0  Out: 200 [Urine:200]   Intake/Output this shift:  No intake/output data recorded.  Physical Exam: General: No acute distress  Head: Normocephalic, atraumatic. Moist oral mucosal membranes  Eyes: Anicteric  Neck: Supple, trachea midline  Lungs:  Clear to auscultation, normal effort  Heart: S1S2 no rubs  Abdomen:  Soft, nontender, BS present  Extremities: No peripheral edema. Gangrene left 1st toe, right 2nd toe  Neurologic: Nonfocal, moving all four extremities  Skin: No lesions  Access: LUE AVF    Basic Metabolic Panel:  Recent Labs Lab 06/27/16 1610 06/29/16 1616 07/01/16 0440  NA  --  136 134*  K  --  3.9 4.4  CL  --  98* 97*  CO2  --  29 28  GLUCOSE  --  158* 73  BUN  --  14 28*   CREATININE  --  3.69* 7.51*  CALCIUM  --  8.2* 8.1*  PHOS 5.3* 2.8  --     Liver Function Tests:  Recent Labs Lab 06/29/16 1616  ALBUMIN 2.7*   No results for input(s): LIPASE, AMYLASE in the last 168 hours. No results for input(s): AMMONIA in the last 168 hours.  CBC:  Recent Labs Lab 06/27/16 1525 06/28/16 0325 06/29/16 0333 07/01/16 0440 07/02/16 0446  WBC 7.0 5.3 5.3 8.0 7.0  HGB 9.3* 9.9* 8.8* 8.6* 8.1*  HCT 27.3* 29.1* 25.7* 25.6* 24.0*  MCV 87.8 88.0 88.3 89.1 89.0  PLT 211 225 204 241 216    Cardiac Enzymes: No results for input(s): CKTOTAL, CKMB, CKMBINDEX, TROPONINI in the last 168 hours.  BNP: Invalid input(s): POCBNP  CBG:  Recent Labs Lab 07/01/16 1200 07/01/16 1426 07/01/16 1636 07/01/16 2130 07/02/16 0741  GLUCAP 118* 75 115* 121* 148*    Microbiology: Results for orders placed or performed during the hospital encounter of 06/24/16  MRSA PCR Screening     Status: None   Collection Time: 06/24/16  5:05 AM  Result Value Ref Range Status   MRSA by PCR NEGATIVE NEGATIVE Final    Comment:        The GeneXpert MRSA Assay (FDA approved for NASAL specimens only), is one component of a comprehensive MRSA colonization surveillance program. It is not intended to diagnose MRSA infection nor to guide or monitor treatment for MRSA infections.   CULTURE,  BLOOD (ROUTINE X 2) w Reflex to ID Panel     Status: None   Collection Time: 06/27/16  7:22 AM  Result Value Ref Range Status   Specimen Description BLOOD RIGHT HAND  Final   Special Requests   Final    BOTTLES DRAWN AEROBIC AND ANAEROBIC Blood Culture adequate volume   Culture NO GROWTH 5 DAYS  Final   Report Status 07/02/2016 FINAL  Final  CULTURE, BLOOD (ROUTINE X 2) w Reflex to ID Panel     Status: None   Collection Time: 06/27/16  7:29 AM  Result Value Ref Range Status   Specimen Description BLOOD RIGHT ASSIST CONTROL  Final   Special Requests   Final    BOTTLES DRAWN AEROBIC AND  ANAEROBIC Blood Culture adequate volume   Culture NO GROWTH 5 DAYS  Final   Report Status 07/02/2016 FINAL  Final    Coagulation Studies:  Recent Labs  07/01/16 1645 07/02/16 0446  LABPROT 13.7 14.7  INR 1.05 1.14    Urinalysis: No results for input(s): COLORURINE, LABSPEC, PHURINE, GLUCOSEU, HGBUR, BILIRUBINUR, KETONESUR, PROTEINUR, UROBILINOGEN, NITRITE, LEUKOCYTESUR in the last 72 hours.  Invalid input(s): APPERANCEUR    Imaging: No results found.   Medications:   . sodium chloride    . sodium chloride    . heparin 1,600 Units/hr (07/02/16 0654)   . amoxicillin-clavulanate  1 tablet Oral Q24H  . aspirin EC  81 mg Oral Daily  . atorvastatin  80 mg Oral QPM  . clopidogrel  75 mg Oral Daily  . epoetin (EPOGEN/PROCRIT) injection  10,000 Units Subcutaneous Once  . epoetin (EPOGEN/PROCRIT) injection  4,000 Units Intravenous Q T,Th,Sa-HD  . ferric citrate  420 mg Oral TID WC  . heparin  2,300 Units Intravenous Once  . insulin aspart  0-5 Units Subcutaneous QHS  . insulin aspart  0-9 Units Subcutaneous TID WC  . lisinopril  2.5 mg Oral Daily  . living well with diabetes book   Does not apply Once  . Melatonin  5 mg Oral QHS  . pneumococcal 23 valent vaccine  0.5 mL Intramuscular Tomorrow-1000  . senna  1 tablet Oral Daily  . tamsulosin  0.4 mg Oral QHS  . Warfarin - Pharmacist Dosing Inpatient   Does not apply q1800   sodium chloride, sodium chloride, acetaminophen **OR** acetaminophen, alteplase, benzonatate, bisacodyl, heparin, lidocaine (PF), lidocaine-prilocaine, nitroGLYCERIN, ondansetron **OR** ondansetron (ZOFRAN) IV, oxyCODONE, pentafluoroprop-tetrafluoroeth, polyethylene glycol  Assessment/ Plan:  61 y.o. Hispanic male ESRD on HD TTS, coronary artery disease, diabetes mellitus type 2, hyperlipidemia, hypertension, history of CVA, anemia chronic kidney disease, secondary hyperparathyroidism who presents with gangrenous changes on toes of both feet.  [facility] TTS  [address] Left AVF   1.  ESRD on HD TTS: seen and examined on hemodialysis. Tolerating treatment well. Continue TTS schedule.    2.  Anemia chronic kidney disease:   - Epogen with dialysis.  3.  Secondary hyperparathyroidism.  Phosphorus at goal - Auryxia with meals.   4.  Hypertension: blood pressure at goal.  - Continue amlodipine,carvedilol, and lisinopril.  5.   Peripheral arterial disease:  Scheduled for right femoral endarterectomt with distal angioplasty Dr. Gilda CreaseSchnier. Scheduled from Friday.    LOS: 8 Eddie Myers 5/3/201810:30 AM

## 2016-07-02 NOTE — Progress Notes (Signed)
Post hd assessment 

## 2016-07-02 NOTE — Consult Note (Signed)
Cardiology Consultation Note  Patient ID: Eddie Myers, MRN: [id number], DOB/AGE: [id number] 62 y.o. Admit date: 06/24/2016   Date of Consult: 07/02/2016 Primary Physician: Pcp Not In System Primary Cardiologist: Dr. Okey Dupre, MD Requesting Physician: Dr. Amado Coe, MD  Chief Complaint: Necrotic toes with bilateral LE pain and erythema  Reason for Consult: Pre-procedure cardiac clearance   HPI: Eddie Myers is a 61 y.o. male who is being seen today for the evaluation of pre-procedure cardiac evaluation at the request of Dr. Amado Coe, MD. Patient has a h/o CAD s/p 2-vessel CABG at [facility] in 01/2014 with LIMA to LAD and SVG to OM complicated by post-operative Afib on Coumadin, ESRD on HD, chronic systolic CHF/ICM with recent normalization of EF by echo 11/2015, PAD, prior stroke in 08/2015 with residual left-sided weakness, IDDM, HTN, possible COPD 2/2 longstanding history of tobacco abuse from age 61 to age 56 at > 1 pack daily who was direct admitted to the hospital from our office on 4/25 for necrotic LE digits. He has undergone left lower extremity angiogram with angioplasty of left superficial femoral, popliteal and peroneal arteries as well as right lower extremity angiogram without cardiac issues. Vascular is planning for right common femoral endarterectomy with distal angioplasty on 5/4. Cardiology asked to clear.   Prior LHC (01/24/14, [facility]): Diffuse LMCA disease up to 90%. LAD with 80% mid and 99% apical stenoses. LCx without disease, though OM1 has diffuse disease. RCA with moderate diffuse disease and 99% lesion in midportion of PDA. This led to 2-vessel CABG with LIMA-LAD and SVG-OM.   14-day event monitor (03/01/16): Predominant rhythm was normal sinus with an average rate of 81 bpm. Occasional PACs and rare PVCs were noted. One episode of SVT lasting 11 beats was identified. Paroxysmal atrial fibrillation (8% atrial fibrillation burden) was noted with rates ranging from 99-182 bpm. Longest episode lasted 7  hours, 15 minutes  Patient most recently underwent TTE in 11/2015 that demonstrated normalization of LVEF 60-65%. Grade 1 diastolic dysfunction. Aortic sclerosis without stenosis. Normal RV size and function. Normal PA pressure. Lexiscan Myoview 01/2016 low risk study with small in size, mild in severity mid and apical anterior defect representing scar vs artifact. No ischemia. LVEF >65%.   From a cardiac standpoint he has been doing well and has been without any symptoms of chest pain or worsening SOB. He does have chronic exertional dyspnea that has been unchanged. His volume is managed by HD. Previously seen in the [facility] ED in late APril for worsening LE pain and discoloration.  He was evaluated by a surgical resident and discharged from the ED with plan for outpatient follow-up on 07/01/16. Pedal pulses were reportedly dopplerable; the patient was discharged before formal peripheral vascular studies could be obtained. His symptoms continued to worsen upon him seeing Dr. Okey Dupre on 4/25. He was felt to have critical LE necrosis and was direct admitted for vascular to evaluate the patient. He has undergone vascular x 2 as above with planned intervention of the right common femoral artery on 5/4.   Of note, he did previously report sudden right vision loss. CT head and MRI not acute. Seen by neurology with recommendation of ASA in addition to Coumadin.   Cardiology consultation performed with the assistance of [facility] Medical Interpreter.   Past Medical History:  Diagnosis Date  . Anginal pain (HCC)   . Coronary artery disease   . Diabetes mellitus without complication (HCC)   . Dialysis patient (HCC)    Tues, Thurs, Sat  .  Dyspnea    with exertion  . Elevated lipids   . Enlarged prostate   . ESRD (end stage renal disease) (HCC)    dialysis M-W-F  . Hypertension   . Myocardial infarction (HCC) 08/2015  . Stroke [facility](HCC)    Lt side this year July      Most Recent Cardiac Studies: Cardiovascular  History & Procedures: Cardiovascular Problems:  CAD s/p CABG (2015)  Post-operative atrial fibrillation (paroxysmal)  Stroke  Peripheral vascular disease  Risk Factors:  Hypertension, hyperlipidemia, diabetes mellitus, male gender, age, and known coronary artery disease  Cath/PCI:  LHC (01/24/14, [facility]): Diffuse LMCA disease up to 90%.  LAD with 80% mid and 99% apical stenoses.  LCx without disease, though OM1 has diffuse disease.  RCA with moderate diffuse disease and 99% lesion in midportion of PDA.  CV Surgery:  [facility] LIMA -> LAD and SVG -> OM  EP Procedures and Devices:  14-day event monitor (03/01/16): Predominant rhythm was normal sinus with an average rate of 81 bpm. Occasional PACs and rare PVCs were noted. One episode of SVT lasting 11 beats was identified. Paroxysmal atrial fibrillation (8% atrial fibrillation burden) was noted with rates ranging from 99-182 bpm. Longest episode lasted 7 hours, 15 minutes.  Non-Invasive Evaluation(s):  Bilateral carotid Doppler (12/09/15, [facility]): Less than 40% internal carotid artery stenosis bilaterally. Patent vertebral arteries bilaterally with antegrade flow. Turbulent flow noted in the left subclavian artery.  Pharmacologic stress test (12/05/15): Low risk study with small in size, mild in severity mid and apical anterior defect representing scar vs artifact.  No ischemia.  LVEF >65%.  TTE (11/21/15): Normal LV size and function (EF 60-65%).  Grade 1 diastolic dysfunction.  Aortic sclerosis without stenosis.  Normal RV size and function.  Normal PA pressure.   Surgical History:  Past Surgical History:  Procedure Laterality Date  . ABDOMINAL AORTOGRAM W/LOWER EXTREMITY Left 06/26/2016   Procedure: Abdominal Aortogram w/Lower Extremity;  Surgeon: Renford DillsGregory G Schnier, MD;  Location: [facility] INVASIVE CV LAB;  Service: Cardiovascular;  Laterality: Left;  . AV FISTULA PLACEMENT Left 01/10/2016   Procedure: INSERTION OF  ARTERIOVENOUS (AV) GORE-TEX GRAFT ARM ( BRACH / AXILLARY GRAFT );  Surgeon: Renford DillsGregory G Schnier, MD;  Location: [facility] ORS;  Service: Vascular;  Laterality: Left;  . CARDIAC CATHETERIZATION    . CORONARY ARTERY BYPASS GRAFT  01/2014   [facility] (LIMA -> LAD and SVG -> OM)  . INSERTION OF DIALYSIS CATHETER Right    Perma catheter  . PERIPHERAL VASCULAR CATHETERIZATION N/A 01/17/2016   Procedure: Thrombectomy;  Surgeon: Renford DillsGregory G Schnier, MD;  Location: [facility] INVASIVE CV LAB;  Service: Cardiovascular;  Laterality: N/A;  . PERIPHERAL VASCULAR CATHETERIZATION Left 01/28/2016   Procedure: A/V Fistulagram;  Surgeon: Renford DillsGregory G Schnier, MD;  Location: [facility] INVASIVE CV LAB;  Service: Cardiovascular;  Laterality: Left;  . PERIPHERAL VASCULAR CATHETERIZATION N/A 03/24/2016   Procedure: Dialysis/Perma Catheter Removal;  Surgeon: Renford DillsGregory G Schnier, MD;  Location: [facility] INVASIVE CV LAB;  Service: Cardiovascular;  Laterality: N/A;  . UPPER EXTREMITY ANGIOGRAM Left 01/28/2016   Procedure: Upper Extremity Angiogram;  Surgeon: Renford DillsGregory G Schnier, MD;  Location: [facility] INVASIVE CV LAB;  Service: Cardiovascular;  Laterality: Left;     Home Meds: Prior to Admission medications   Medication Sig Start Date End Date Taking? Authorizing Provider  amLODipine (NORVASC) 2.5 MG tablet Take 2.5 mg by mouth daily.   Yes Historical Provider, MD  atorvastatin (LIPITOR) 80 MG tablet Take 80 mg  by mouth every evening.  02/07/16  Yes Historical Provider, MD  carvedilol (COREG) 25 MG tablet Take 1 tablet (25 mg total) by mouth 2 (two) times daily. Please contact the patient's cardiologist to see if this medication should be continued 03/24/16 06/24/16 Yes Renford Dills, MD  ferric citrate (AURYXIA) 1 GM 210 MG(Fe) tablet Take 420 mg by mouth 3 (three) times daily with meals.   Yes Historical Provider, MD  glipiZIDE (GLUCOTROL) 5 MG tablet Take 5 mg by mouth 2 (two) times daily.    Yes Historical Provider, MD  insulin glargine (LANTUS)  100 UNIT/ML injection Inject 25 Units into the skin at bedtime.    Yes Historical Provider, MD  insulin regular (NOVOLIN R,HUMULIN R) 100 units/mL injection Inject 0-10 Units into the skin 3 (three) times daily before meals.    Yes Historical Provider, MD  lisinopril (PRINIVIL,ZESTRIL) 2.5 MG tablet Take 2.5 mg by mouth daily.   Yes Historical Provider, MD  Melatonin 3 MG TABS Take 3 mg by mouth at bedtime.    Yes Historical Provider, MD  nitroGLYCERIN (NITROSTAT) 0.4 MG SL tablet Place 0.4 mg under the tongue every 5 (five) minutes as needed for chest pain.   Yes Historical Provider, MD  tamsulosin (FLOMAX) 0.4 MG CAPS capsule Take 0.4 mg by mouth at bedtime.   Yes Historical Provider, MD  warfarin (COUMADIN) 5 MG tablet Take as directed by [facility] Patient taking differently: Take 5 mg by mouth daily at 6 PM.  04/08/16  Yes Yvonne Kendall, MD    Inpatient Medications:  . amoxicillin-clavulanate  1 tablet Oral Q24H  . aspirin EC  81 mg Oral Daily  . atorvastatin  80 mg Oral QPM  . clopidogrel  75 mg Oral Daily  . epoetin (EPOGEN/PROCRIT) injection  10,000 Units Subcutaneous Once  . epoetin (EPOGEN/PROCRIT) injection  4,000 Units Intravenous Q T,Th,Sa-HD  . ferric citrate  420 mg Oral TID WC  . heparin  2,300 Units Intravenous Once  . insulin aspart  0-5 Units Subcutaneous QHS  . insulin aspart  0-9 Units Subcutaneous TID WC  . lisinopril  2.5 mg Oral Daily  . living well with diabetes book   Does not apply Once  . Melatonin  5 mg Oral QHS  . pneumococcal 23 valent vaccine  0.5 mL Intramuscular Tomorrow-1000  . senna  1 tablet Oral Daily  . tamsulosin  0.4 mg Oral QHS  . Warfarin - Pharmacist Dosing Inpatient   Does not apply q1800   . sodium chloride    . sodium chloride    . heparin 1,600 Units/hr (07/02/16 0654)    Allergies: No Known Allergies  Social History   Social History  . Marital status: Single    Spouse name: N/A  . Number of children: N/A  . Years of  education: N/A   Occupational History  . Not on file.   Social History Main Topics  . Smoking status: Former Smoker    Packs/day: 0.50    Years: 45.00    Quit date: 01/05/2015  . Smokeless tobacco: Never Used  . Alcohol use No  . Drug use: No  . Sexual activity: Not on file   Other Topics Concern  . Not on file   Social History Narrative  . No narrative on file     Family History  Problem Relation Age of Onset  . Cancer Mother   . Diabetes Mother   . Diabetes Father      Review  of Systems: Review of Systems  Constitutional: Positive for malaise/fatigue. Negative for chills, diaphoresis, fever and weight loss.  HENT: Negative for congestion.   Eyes: Negative for discharge and redness.  Respiratory: Positive for shortness of breath. Negative for cough, hemoptysis, sputum production and wheezing.        SOB with prolonged ambulation   Cardiovascular: Positive for claudication and leg swelling. Negative for chest pain, palpitations, orthopnea and PND.  Gastrointestinal: Negative for abdominal pain, blood in stool, heartburn, melena, nausea and vomiting.  Genitourinary: Negative for hematuria.  Musculoskeletal: Negative for falls and myalgias.  Skin: Negative for rash.  Neurological: Positive for weakness. Negative for dizziness, tingling, tremors, sensory change, speech change, focal weakness and loss of consciousness.  Endo/Heme/Allergies: Does not bruise/bleed easily.  Psychiatric/Behavioral: Negative for substance abuse. The patient is not nervous/anxious.   All other systems reviewed and are negative.   Labs: No results for input(s): CKTOTAL, CKMB, TROPONINI in the last 72 hours. Lab Results  Component Value Date   WBC 7.0 07/02/2016   HGB 8.1 (L) 07/02/2016   HCT 24.0 (L) 07/02/2016   MCV 89.0 07/02/2016   PLT 216 07/02/2016    Recent Labs Lab 07/01/16 0440  NA 134*  K 4.4  CL 97*  CO2 28  BUN 28*  CREATININE 7.51*  CALCIUM 8.1*  GLUCOSE 73   No  results found for: CHOL, HDL, LDLCALC, TRIG No results found for: DDIMER  Radiology/Studies:  Ct Head Wo Contrast  Result Date: 06/24/2016 CLINICAL DATA:  Episode of visual loss in RIGHT eye 3-4 weeks, still with some visual deficits, history of hypertension, diabetes EXAM: CT HEAD WITHOUT CONTRAST TECHNIQUE: Contiguous axial images were obtained from the base of the skull through the vertex without intravenous contrast. COMPARISON:  None. FINDINGS: Brain: Generalized atrophy. Normal ventricular morphology. No midline shift or mass effect. Small vessel chronic ischemic changes of deep cerebral white matter. Small lacunar infarct in RIGHT thalamus, age-indeterminate. No intracranial hemorrhage, mass lesion or evidence of cortical infarction. No extra-axial fluid collections. Vascular: Atherosclerotic calcifications throughout the carotid siphons. Skull: Intact Sinuses/Orbits: Clear Other: N/A IMPRESSION: Atrophy with small vessel chronic ischemic changes of deep cerebral white matter. 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 No evidence of cortical infarction or intracranial hemorrhage. Electronically Signed   By: Ulyses Southward M.D.   On: 06/24/2016 15:47   Mr Brain Wo Contrast  Result Date: 06/26/2016 CLINICAL DATA:  61 y/o  M; cerebral infarction. EXAM: MRI HEAD WITHOUT CONTRAST TECHNIQUE: Multiplanar, multiecho pulse sequences of the brain and surrounding structures were obtained without intravenous contrast. COMPARISON:  06/24/2016 CT of the head. FINDINGS: Brain: No acute infarction, hemorrhage, hydrocephalus, extra-axial collection or mass lesion. Foci of T2 FLAIR hyperintense signal abnormality in periventricular white matter are nonspecific but compatible with mild chronic microvascular ischemic changes. There is mild brain parenchymal volume loss. Small chronic lacunar infarcts are present within the left inferior cerebellar hemisphere, bilateral thalamus, and right lentiform nucleus.  Vascular: Normal flow voids. Skull and upper cervical spine: Normal marrow signal. Sinuses/Orbits: Negative. Other: None. IMPRESSION: 1. No acute intracranial abnormality identified. 2. Mild chronic microvascular ischemic changes and mild parenchymal volume loss of the brain. 3. Small chronic lacunar infarcts are present within the left inferior cerebellar hemisphere, bilateral thalamus, and right lentiform nucleus. Electronically Signed   By: Mitzi Hansen M.D.   On: 06/26/2016 13:58   Dg Foot Complete Left  Result Date: 06/28/2016 CLINICAL DATA:  Gangrene EXAM: LEFT FOOT - COMPLETE 3+  VIEW COMPARISON:  06/20/2016 FINDINGS: No acute fracture or dislocation. Generalized osteopenia. No periosteal reaction or bone destruction. No soft tissue emphysema. Extensive peripheral vascular atherosclerotic disease. IMPRESSION: 1.  No acute abnormality of the left foot. Electronically Signed   By: Elige Ko   On: 06/28/2016 11:33   Dg Foot Complete Right  Result Date: 06/28/2016 CLINICAL DATA:  Gangrene EXAM: RIGHT FOOT COMPLETE - 3+ VIEW COMPARISON:  06/20/2016 FINDINGS: No acute fracture or dislocation. Generalized osteopenia. No periosteal reaction or bone destruction. No soft tissue emphysema. Extensive peripheral vascular atherosclerotic disease. IMPRESSION: 1. No acute abnormality of the right foot. Electronically Signed   By: Elige Ko   On: 06/28/2016 11:32    EKG: Interpreted by me showed: NSR, 74 bpm, no acute st/t changes  Telemetry: Interpreted by me showed: sinus rhythm   Weights: Filed Weights   06/29/16 1512 07/01/16 1115 07/02/16 1018  Weight: 170 lb 3.1 oz (77.2 kg) 170 lb (77.1 kg) 178 lb 2.1 oz (80.8 kg)     Physical Exam: Blood pressure 131/66, pulse 72, temperature 98.2 F (36.8 C), temperature source Oral, resp. rate 11, height 5\' 8"  (1.727 m), weight 178 lb 2.1 oz (80.8 kg), SpO2 94 %. Body mass index is 27.08 kg/m. General: Well developed, well nourished, in no  acute distress. Head: Normocephalic, atraumatic, sclera non-icteric, no xanthomas, nares are without discharge.  Neck: Negative for carotid bruits. JVD not elevated. Lungs: Bilateral crackles highway up the posterior lung fields. Breathing is unlabored. Heart: RRR with S1 S2. No murmurs, rubs, or gallops appreciated. Abdomen: Soft, non-tender, non-distended with normoactive bowel sounds. No hepatomegaly. No rebound/guarding. No obvious abdominal masses. Msk:  Strength and tone appear normal for age. Extremities: No clubbing or cyanosis. No edema. DP pulses trace bilaterally. PT nonpalpable Left great toe black with surrounding erythema. Right 2nd digit black with sounding erythema.  Neuro: Alert and oriented X 3. No facial asymmetry. No focal deficit. Moves all extremities spontaneously. Psych:  Responds to questions appropriately with a normal affect.    Assessment and Plan:  Active Problems:   Gangrene of lower extremity (HCC)    1. Pre-procedure cardiac exam: -PREOPERATIVE CARDIAC RISK ASSESSMENT:   Revised Cardiac Risk Index:  High Risk Surgery (defined as Intraperitoneal, intrathoracic or suprainguinal vascular): yes; (suprainguinal vascular)  Active CAD: no  CHF: yes  Cerebrovascular Disease: yes   Diabetes: yes; On Insulin: yes  CKD (Cr >~ 2): yes   Total: 5 Estimated Risk of Adverse Outcome: high-risk for high-risk surgery. Estimated Risk of MI, PE, VF/VT (Cardiac Arrest), Complete Heart Block: 11 %   ACC/AHA Guidelines for "Clearance":  Step 1 - Need for Emergency Surgery: no   If Yes - go straight to OR with perioperative surveillance  Step 2 - Active Cardiac Conditions (Unstable Angina, Decompensated HF, Significant  Arrhytmias - Complete HB, Mobitz II, Symptomatic VT or SVT, Severe Aortic Stenosis - mean gradient > 40 mmHg, Valve area < 1.0 cm2): no   If Yes - Evaluate & Treat per ACC/AHA Guidelines  Step 3 -  Low Risk Surgery: no  If Yes -->  proceed to OR  If No --> Step 4  Step 4 - Functional Capacity >= 4 METS without symptoms: Duke Activity Score: 9.95 METs  If Yes --> proceed to OR  If No --> Step 5  Step 5 --  Clinical Risk Factors (CRF)  - Zero --> proceed to OR   -Recent TTE 10/2105 with normal LVEF and recent nuclear  stress test 01/2016 low risk -Doubtful any further cardiac testing would change his risk stratification or possible outcomes   2. 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 CAD s/p CABG as above: -Recent nuclear stress test normal -No symptoms concerning for ischemia  -As outpatient on Coumadin in place of ASA given history of PAF -Coreg, Lipitor  4. Volume overload/ICM: -Patient received peri-operative fluids all night. Now with crackles on exam bilaterally  -At baseline he has not been volume overloaded -He is for HD today  5. ESRD on HD: -Per renal  6. Subacute right sided vision loss: -Patient had symptoms about a month ago -CT head shows a age-indeterminate right sided thalamic CVA -MRI shows old CVA -Neurology recommends ASA in addition to Coumadin -Coumadin per PAF   7. PAF: -Currently in sinus rhythm -Coumadin on hold for vascular procedures -Heparin gtt -Coreg  8. Remaining per IM   Signed, Eula Listen, PA-C [facility] Pager: [phone] 07/02/2016, 11:22 AM

## 2016-07-02 NOTE — Progress Notes (Signed)
Tap water enema not given due to patient moved bowels already.

## 2016-07-02 NOTE — Progress Notes (Signed)
Post hd vitals 

## 2016-07-02 NOTE — Progress Notes (Signed)
ANTICOAGULATION CONSULT NOTE   Pharmacy Consult for heparin Indication: atrial fibrillation  No Known Allergies  Patient Measurements: Height: 5\' 8"  (172.7 cm) Weight: 165 lb 5.5 oz (75 kg) IBW/kg (Calculated) : 68.4 Heparin Dosing Weight:   Vital Signs: Temp: 98.1 F (36.7 C) (04/27 0713) Temp Source: Oral (04/27 0713) BP: 137/63 (04/27 1053) Pulse Rate: 84 (04/27 1053)  Labs:  Recent Labs (last 2 labs)    Recent Labs  06/24/16 1524 06/25/16 0434 06/26/16 0444  HGB 9.9* 10.1* --   HCT 29.0* 29.1* --   PLT 324 265 --   LABPROT 36.2* 38.6* 17.8*  INR 3.53 3.82 1.45  CREATININE 6.66* 7.88* --       Estimated Creatinine Clearance: 9.4 mL/min (A) (by C-G formula based on SCr of 7.88 mg/dL (H)).    Assessment: 61 yom with severe pain in both feet. Planning for angiogram on Tuesday. Patient takes VKA at home for AF. Pharmacy consulted to dose heparin for AF bridge perioperatively.   Goal of Therapy: Heparin level 0.3-0.7 units/ml Monitor platelets by anticoagulation protocol: Yes  Plan: 5/1 HL = 0.53 @ 1455 which is therapeutic. Continue heparin infusion at current rate of 1600 units/hr and order confirmatory HL in 8 hours. CBC with AM labs tomorrow.         5/1 23:00 heparin level 0.51. Continue current regimen. Recheck heparin level and CBC with AM labs.                5/2 AM heparin level 0.59. Continue current regimen. Recheck heparin level and CBC with tomorrow AM labs.  5/3 AM heparin level 0.34. Continue current regimen. Recheck heparin level and CBC with tomorrow AM labs.   Thank you for this consult.  Fulton ReekMatt Shiah Berhow, PharmD, BCPS  07/02/16 6:03 AM      Addendum:  Heparin ordered to infuse at 1600 units/hr. Last charted dose on MAR was 1300 units/hr at 0619 this morning. Confirmed with RN that heparin is currently infusing at 1600 units/hr and rate has not been adjusted this shift so HL should reflect 1600 units/hr  dose.    Swanson Farnell S, PharmD 07/02/16 6:03 AM

## 2016-07-02 NOTE — Op Note (Signed)
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  Right lower extremity angiogram third order cath placement  2.  Bilateral lower extremity distal run off  3.  Mynx closure left femoral    Anesthesia: Conscious sedation was administered by the interventional radiology RN under my direct supervision. IV Versed plus fentanyl were utilized. Continuous ECG, pulse oximetry and blood pressure was monitored throughout the entire procedure.  Conscious sedation was administered for a total of 42 minutes.  Sheath: 5 French Pinnacle left common femoral  Contrast: 40 cc   Fluoroscopy Time: Approximately 7 minutes  Indications:  Patient presented to the hospital with gangrene of the toes bilaterally and nonpalpable pulses. He is at risk for limb loss and is therefore undergoing angiography with the hope for limb salvage.  Procedure:  Eddie Myers a 61 y.o. male who was identified and appropriate procedural time out was performed.  The patient was then placed supine on the table and prepped and draped in the usual sterile fashion.  Ultrasound was used to evaluate the left common femoral artery.  It was echolucent and pulsatile indicating it is patent .  An ultrasound image was acquired for the permanent record.  A micropuncture needle was used to access the left common femoral artery under direct ultrasound guidance.  The microwire was then advanced under fluoroscopic guidance without difficulty followed by the micro-sheath.  A 0.035 J wire was advanced without resistance and a 5Fr sheath was placed.    Pigtail catheter was then advanced to the level of T12 and AP projection of the aorta was obtained. Pigtail catheter was then repositioned  to above the bifurcation and LAO view of the pelvis was obtained. Stiff angled Glidewire and pigtail catheter was then used across the bifurcation and the catheter was positioned in the distal external iliac artery.  RAO view of the right groin was then obtained. Wire was reintroduced and negotiated into the SFA and the catheter was advanced into the SFA. Distal runoff was then performed.  Hand injection contrast through the Pinnacle sheath was then used to perform distal runoff of the left lower extremity down to the foot.  After review of the images the catheter was removed over wire and an LAO view of the groin was obtained. StarClose device was deployed without difficulty.   Findings:   Aortogram:  Previously done distal aorta on the initial oblique pelvic view is widely patent.  Right Lower Extremity:  The right common and external iliac arteries are widely patent. The right common femoral artery demonstrates an 80% stenosis with a large ulceration. There is ostial stenosis of both the SFA and the profunda. The profunda demonstrates a 90% stenosis several centimeters distally in its main branch. The SFA demonstrates numerous greater than 80% stenoses along its path this includes the popliteal. The anterior tibial is occluded. The peroneal and posterior tibial demonstrate subtotal occlusions in approximately one third distally they are patent down to the foot.  Left Lower Extremity:  The left common femoral demonstrates a 50% to 60% stenosis with ostial plaque in the SFA as well as the profunda femoris. AP as well as bilateral oblique views are obtained. This plaque burden appears to be borderline for surgical intervention. If his foot remains nonhealing endarterectomy must be considered. The SFA and  popliteal are widely patent status post intervention last week. He has single vessel runoff via the peroneal which also was angioplastied. His anterior tibial is occluded throughout its course his  posterior tibial is patent in the proximal half but occludes and does not reconstitute at the level of the ankle.   Disposition: Patient was taken to the recovery room in stable condition having tolerated the procedure well.  Eddie Myers 07/02/2016,8:09 AM

## 2016-07-02 NOTE — Progress Notes (Signed)
[facility] [place] [facility]   PATIENT NAME: Eddie Myers    MR#:  [id number]  DATE OF BIRTH:  07/18/1955  SUBJECTIVE:  CHIEF COMPLAINT:  No chief complaint on file. Patient with no new complaints. spanish interpreter at bedside. hungry REVIEW OF SYSTEMS:    Review of Systems  Constitutional: Positive for malaise/fatigue. Negative for chills and fever.  HENT: Negative for sore throat.   Eyes: Negative for blurred vision, double vision and pain.  Respiratory: Negative for cough, hemoptysis, shortness of breath and wheezing.   Cardiovascular: Negative for chest pain, palpitations, orthopnea and leg swelling.  Gastrointestinal: Negative for abdominal pain, constipation, diarrhea, heartburn, nausea and vomiting.  Genitourinary: Negative for dysuria and hematuria.  Musculoskeletal: Negative for back pain and joint pain.  Skin: Negative for rash.  Neurological: Positive for weakness. Negative for sensory change, speech change, focal weakness and headaches.  Endo/Heme/Allergies: Does not bruise/bleed easily.  Psychiatric/Behavioral: Negative for depression. The patient is not nervous/anxious.    DRUG ALLERGIES:  No Known Allergies  VITALS:  Blood pressure 129/62, pulse 84, temperature 98.2 F (36.8 C), temperature source Oral, resp. rate 10, height 5\' 8"  (1.727 m), weight 79.3 kg (174 lb 13.2 oz), SpO2 95 %.  PHYSICAL EXAMINATION:   Physical Exam  GENERAL:  61 y.o.-year-old patient lying in the bed with no acute distress.  EYES: Pupils equal, round, reactive to light and accommodation. No scleral icterus. Extraocular muscles intact.  HEENT: Head atraumatic, normocephalic. Oropharynx and nasopharynx clear.  NECK:  Supple, no jugular venous distention. No thyroid enlargement, no tenderness.  LUNGS: Normal breath sounds bilaterally, no wheezing, rales, rhonchi. No use of accessory muscles of respiration.  CARDIOVASCULAR: S1, S2 normal. No murmurs, rubs, or  gallops.  ABDOMEN: Soft, nontender, nondistended. Bowel sounds present. No organomegaly or mass.  EXTREMITIES: Toes bilaterally are discolored with dry gangrene of the right second toe and left foot great toe NEUROLOGIC: Cranial nerves II through XII are intact. No focal Motor or sensory deficits b/l.   PSYCHIATRIC: The patient is alert and oriented x 3.  SKIN: No obvious rash, lesion, or ulcer.  LABORATORY PANEL:   CBC  Recent Labs Lab 07/02/16 0446  WBC 7.0  HGB 8.1*  HCT 24.0*  PLT 216   ------------------------------------------------------------------------------------------------------------------ Chemistries   Recent Labs Lab 07/01/16 0440  NA 134*  K 4.4  CL 97*  CO2 28  GLUCOSE 73  BUN 28*  CREATININE 7.51*  CALCIUM 8.1*   ------------------------------------------------------------------------------------------------------------------  Cardiac Enzymes No results for input(s): TROPONINI in the last 168 hours. ------------------------------------------------------------------------------------------------------------------  RADIOLOGY:  No results found.   ASSESSMENT AND PLAN:  Eddie Myers  is a 61 y.o. male with a known history of End-stage renal disease on hemodialysis, diabetes, hypertension, hyperlipidemia, history of previous CVA, history of coronary artery disease status post bypass who presented to the hospital from his cardiologist's office due to bilateral lower extremity necrotic toes redness and pain.  * Fever likely due to gangrenous toes - PO Augmentin to finish 7 days course.  * Bilateral lower extremity cyanosis with necrotic toes --Cardiology has cleared the patient for surgery tomorrow. Discussed with Dr. Lewie Loron, patient's recent nuclear stress test was normal Pain control with IV and oral pain medications Status post left lower extremity angiogram with angioplasty of left superficial femoral, popliteal and peroneal arteries. - s/p Right  lower extremity angiogram - planned for rt common femoral endarterectomy with distal angioplasty tomorrow. Patient will be nothing by mouth after midnight  *  Subacute right sided vision loss-patient had symptoms about a month ago.  -CT head shows a age-indeterminate right sided thalamic CVA Appreciate Neurology input MRI shows old CVA  * Paroxysmal atrial fibrillation on Coumadin.  Coumadin held for angiogram On heparin drip He may need Lovenox to go home with for a few days if scheduled for surgery by podiatry in a week. If not scheduled for surgery then coumadin  * End-stage renal disease on hemodialysis-patient gets dialysis on Tuesday Thursday Saturday Getting dialysis per nephro, for dialysis today  * Hypotension with h/o hypertension-continue lisinopril, Hold carvedilol, Norvasc.  * Hypoglycemia with h/o Diabetes type 2 without complication-stop lantus, continue SSI, start d5ns  * Hyperlipidemia-continue atorvastatin  * Throat  pain - Tessalon Perles as needed  Follow-up with vascular surgery, podiatry, nephrology and [facility]ology [facility]   All the records are reviewed and case discussed with Care Management/Social Worker. Management plans discussed with the patient, nursing and they are in agreement.  CODE STATUS: FULL CODE  DVT Prophylaxis: SCDs  TOTAL TIME TAKING CARE OF THIS PATIENT: 35 minutes.   POSSIBLE D/C IN 1-2 DAYS, DEPENDING ON CLINICAL CONDITION.  Eddie Myers, Eddie Myers M.D on 07/02/2016 at 1:34 PM  Between 7am to 6pm - Pager - [phone]  After 6pm go to [email or web address] - password [email or web address]  [facility]  Office  [phone]  CC: Primary care physician; Pcp Not In System  Note: This dictation was prepared with Dragon dictation along with smaller phrase technology. Any transcriptional errors that result from this process are unintentional.

## 2016-07-02 NOTE — Progress Notes (Signed)
Pre tx vitals

## 2016-07-02 NOTE — Progress Notes (Signed)
  End of hd 

## 2016-07-02 NOTE — Progress Notes (Signed)
Hd start 

## 2016-07-02 NOTE — Progress Notes (Addendum)
ANTICOAGULATION CONSULT NOTE - Initial Consult  Pharmacy Consult for warfarin Indication: AF  No Known Allergies  Patient Measurements: Height: 5\' 8"  (172.7 cm) Weight: 170 lb (77.1 kg) IBW/kg (Calculated) : 68.4 Heparin Dosing Weight:   Vital Signs: Temp: 98 F (36.7 C) (05/03 0503) Temp Source: Oral (05/03 0503) BP: 127/54 (05/03 0503) Pulse Rate: 73 (05/03 0503)  Labs:  Recent Labs  06/29/16 1616  06/30/16 2314 07/01/16 0440 07/01/16 1645 07/02/16 0446  HGB  --   --   --  8.6*  --  8.1*  HCT  --   --   --  25.6*  --  24.0*  PLT  --   --   --  241  --  216  LABPROT  --   --   --   --  13.7 14.7  INR  --   --   --   --  1.05 1.14  HEPARINUNFRC  --   < > 0.51 0.59  --  0.34  CREATININE 3.69*  --   --  7.51*  --   --   < > = values in this interval not displayed.  Estimated Creatinine Clearance: 9.9 mL/min (A) (by C-G formula based on SCr of 7.51 mg/dL (H)).   Medical History: Past Medical History:  Diagnosis Date  . Anginal pain (HCC)   . Coronary artery disease   . Diabetes mellitus without complication (HCC)   . Dialysis patient (HCC)    Tues, Thurs, Sat  . Dyspnea    with exertion  . Elevated lipids   . Enlarged prostate   . ESRD (end stage renal disease) (HCC)    dialysis M-W-F  . Hypertension   . Myocardial infarction (HCC) 08/2015  . Stroke [facility](HCC)    Lt side this year July    Medications:  Infusions:  . sodium chloride    . sodium chloride    . dextrose 5 % and 0.9% NaCl 75 mL/hr at 07/02/16 0500  . heparin 1,600 Units/hr (07/02/16 0654)    Assessment: 61 yom cc necrotic toes, followed by nephrology for ESRD HD, vascular surgery for angiogram (today), and hospitalist. Admitted with supratherapeutic INR, bridged with UFH for procedure. INR reversed with Vitamin K 2 mg subcutaneously on 4/26. Pharmacy consulted to resume VKA post angio.   Date INR Dose 5/2 1.05 5 mg 5/3 1.14 HELD  Goal of Therapy:  INR 2-3 Monitor platelets by  anticoagulation protocol: Yes   Plan:  Vascular plans to proceed with right femoral endarterectomy and distal angiogram tomorrow - will hold VKA for procedure.  Carola FrostNathan A Keeon Zurn, Pharm.D., BCPS Clinical Pharmacist 07/02/2016,7:36 AM

## 2016-07-02 NOTE — Progress Notes (Signed)
Patient just came back from dialysis.Patient c/o throat obstruction. This happened in the past per patient. It also happened yesterday but it went away. But today it does not seem like it;'s going away. VS stable. No signs of acute distress. Denies pain, nausea or vomiting.Order received and continue to monitor.

## 2016-07-02 NOTE — Progress Notes (Signed)
Pre hd assessment  

## 2016-07-02 NOTE — Plan of Care (Signed)
Problem: Fluid Volume: Goal: Ability to maintain a balanced intake and output will improve Outcome: Not Progressing Patient is NPO.  Problem: Nutrition: Goal: Adequate nutrition will be maintained Outcome: Not Progressing Patient is NPO.   

## 2016-07-03 ENCOUNTER — Inpatient Hospital Stay: Payer: Medicare Other | Admitting: Registered Nurse

## 2016-07-03 ENCOUNTER — Encounter: Payer: Self-pay | Admitting: Anesthesiology

## 2016-07-03 ENCOUNTER — Encounter
Admission: AD | Disposition: A | Discharge status: Skilled Nursing Facility | Payer: Self-pay | Source: Ambulatory Visit | Attending: Internal Medicine

## 2016-07-03 DIAGNOSIS — I70261 Atherosclerosis of native arteries of extremities with gangrene, right leg: Secondary | ICD-10-CM

## 2016-07-03 HISTORY — PX: PERIPHERAL VASCULAR BALLOON ANGIOPLASTY: CATH118281

## 2016-07-03 HISTORY — PX: LOWER EXTREMITY ANGIOGRAPHY: CATH118251

## 2016-07-03 HISTORY — PX: LOWER EXTREMITY INTERVENTION: CATH118252

## 2016-07-03 LAB — CBC
HCT: 23.3 % — ABNORMAL LOW (ref 40.0–52.0)
HEMOGLOBIN: 7.8 g/dL — AB (ref 13.0–18.0)
MCH: 29.5 pg (ref 26.0–34.0)
MCHC: 33.5 g/dL (ref 32.0–36.0)
MCV: 88.1 fL (ref 80.0–100.0)
PLATELETS: 227 10*3/uL (ref 150–440)
RBC: 2.65 MIL/uL — ABNORMAL LOW (ref 4.40–5.90)
RDW: 15.1 % — ABNORMAL HIGH (ref 11.5–14.5)
WBC: 8.1 10*3/uL (ref 3.8–10.6)

## 2016-07-03 LAB — CBC WITH DIFFERENTIAL/PLATELET
Basophils Absolute: 0.1 10*3/uL (ref 0–0.1)
Basophils Relative: 1 %
EOS ABS: 0.3 10*3/uL (ref 0–0.7)
Eosinophils Relative: 3 %
HCT: 25.4 % — ABNORMAL LOW (ref 40.0–52.0)
HEMOGLOBIN: 8.3 g/dL — AB (ref 13.0–18.0)
LYMPHS ABS: 1.5 10*3/uL (ref 1.0–3.6)
Lymphocytes Relative: 17 %
MCH: 29.1 pg (ref 26.0–34.0)
MCHC: 32.8 g/dL (ref 32.0–36.0)
MCV: 88.6 fL (ref 80.0–100.0)
MONO ABS: 1 10*3/uL (ref 0.2–1.0)
MONOS PCT: 12 %
NEUTROS PCT: 67 %
Neutro Abs: 5.9 10*3/uL (ref 1.4–6.5)
Platelets: 241 10*3/uL (ref 150–440)
RBC: 2.87 MIL/uL — ABNORMAL LOW (ref 4.40–5.90)
RDW: 15.2 % — ABNORMAL HIGH (ref 11.5–14.5)
WBC: 8.7 10*3/uL (ref 3.8–10.6)

## 2016-07-03 LAB — PROTIME-INR
INR: 1.13
PROTHROMBIN TIME: 14.6 s (ref 11.4–15.2)

## 2016-07-03 LAB — HEPARIN LEVEL (UNFRACTIONATED): HEPARIN UNFRACTIONATED: 0.34 [IU]/mL (ref 0.30–0.70)

## 2016-07-03 LAB — GLUCOSE, CAPILLARY
GLUCOSE-CAPILLARY: 114 mg/dL — AB (ref 65–99)
Glucose-Capillary: 125 mg/dL — ABNORMAL HIGH (ref 65–99)
Glucose-Capillary: 137 mg/dL — ABNORMAL HIGH (ref 65–99)
Glucose-Capillary: 137 mg/dL — ABNORMAL HIGH (ref 65–99)
Glucose-Capillary: 141 mg/dL — ABNORMAL HIGH (ref 65–99)

## 2016-07-03 LAB — POTASSIUM (ARMC VASCULAR LAB ONLY): Potassium (ARMC vascular lab): 4.8 (ref 3.5–5.1)

## 2016-07-03 SURGERY — PERIPHERAL VASCULAR BALLOON ANGIOPLASTY
Anesthesia: General

## 2016-07-03 MED ORDER — PROPOFOL 10 MG/ML IV BOLUS
INTRAVENOUS | Status: AC
  Filled 2016-07-03: qty 20

## 2016-07-03 MED ORDER — CEFAZOLIN SODIUM-DEXTROSE 1-4 GM/50ML-% IV SOLN
1.0000 g | Freq: Two times a day (BID) | INTRAVENOUS | Status: AC
  Administered 2016-07-03 – 2016-07-04 (×2): 1 g via INTRAVENOUS
  Filled 2016-07-03 (×2): qty 50

## 2016-07-03 MED ORDER — OXYCODONE HCL 5 MG PO TABS: 5.0000 mg | ORAL_TABLET | Freq: Once | ORAL | Status: DC | PRN

## 2016-07-03 MED ORDER — SODIUM CHLORIDE 0.9 % IV SOLN
INTRAVENOUS | Status: DC | PRN
  Administered 2016-07-03: 30 ug/min via INTRAVENOUS

## 2016-07-03 MED ORDER — CEFAZOLIN SODIUM 1 G IJ SOLR
INTRAMUSCULAR | Status: DC | PRN
  Administered 2016-07-03: 1 g via INTRAMUSCULAR

## 2016-07-03 MED ORDER — SEVOFLURANE IN SOLN
RESPIRATORY_TRACT | Status: AC
  Filled 2016-07-03: qty 250

## 2016-07-03 MED ORDER — PHENYLEPHRINE HCL 10 MG/ML IJ SOLN
INTRAMUSCULAR | Status: DC | PRN
  Administered 2016-07-03: 200 ug via INTRAVENOUS
  Administered 2016-07-03 (×2): 50 ug via INTRAVENOUS
  Administered 2016-07-03: 100 ug via INTRAVENOUS

## 2016-07-03 MED ORDER — FENTANYL CITRATE (PF) 100 MCG/2ML IJ SOLN
INTRAMUSCULAR | Status: AC
Start: 2016-07-03 — End: 2016-07-03
  Filled 2016-07-03: qty 2

## 2016-07-03 MED ORDER — MIDAZOLAM HCL 2 MG/2ML IJ SOLN
INTRAMUSCULAR | Status: DC | PRN
  Administered 2016-07-03: 1 mg via INTRAVENOUS

## 2016-07-03 MED ORDER — IOPAMIDOL (ISOVUE-300) INJECTION 61%
INTRAVENOUS | Status: DC | PRN
  Administered 2016-07-03: 55 mL via INTRA_ARTERIAL

## 2016-07-03 MED ORDER — EPHEDRINE SULFATE 50 MG/ML IJ SOLN
INTRAMUSCULAR | Status: AC
Start: 2016-07-03 — End: 2016-07-03
  Filled 2016-07-03: qty 1

## 2016-07-03 MED ORDER — IPRATROPIUM-ALBUTEROL 0.5-2.5 (3) MG/3ML IN SOLN
RESPIRATORY_TRACT | Status: AC
  Filled 2016-07-03: qty 3

## 2016-07-03 MED ORDER — FENTANYL CITRATE (PF) 100 MCG/2ML IJ SOLN
INTRAMUSCULAR | Status: AC
  Filled 2016-07-03: qty 2

## 2016-07-03 MED ORDER — EPHEDRINE SULFATE 50 MG/ML IJ SOLN
INTRAMUSCULAR | Status: AC
  Filled 2016-07-03: qty 1

## 2016-07-03 MED ORDER — PHENOL 1.4 % MT LIQD
1.0000 | OROMUCOSAL | Status: DC | PRN
  Filled 2016-07-03: qty 177

## 2016-07-03 MED ORDER — LIDOCAINE HCL (PF) 2 % IJ SOLN
INTRAMUSCULAR | Status: AC
  Filled 2016-07-03: qty 2

## 2016-07-03 MED ORDER — LIDOCAINE HCL (CARDIAC) 20 MG/ML IV SOLN
INTRAVENOUS | Status: DC | PRN
  Administered 2016-07-03: 60 mg via INTRAVENOUS

## 2016-07-03 MED ORDER — IPRATROPIUM-ALBUTEROL 0.5-2.5 (3) MG/3ML IN SOLN: 3.0000 mL | Freq: Four times a day (QID) | RESPIRATORY_TRACT | Status: DC | PRN

## 2016-07-03 MED ORDER — PROPOFOL 10 MG/ML IV BOLUS
INTRAVENOUS | Status: DC | PRN
  Administered 2016-07-03: 20 mg via INTRAVENOUS
  Administered 2016-07-03: 30 mg via INTRAVENOUS
  Administered 2016-07-03: 150 mg via INTRAVENOUS

## 2016-07-03 MED ORDER — FENTANYL CITRATE (PF) 100 MCG/2ML IJ SOLN
INTRAMUSCULAR | Status: DC | PRN
  Administered 2016-07-03: 50 ug via INTRAVENOUS
  Administered 2016-07-03 (×3): 25 ug via INTRAVENOUS
  Administered 2016-07-03: 50 ug via INTRAVENOUS
  Administered 2016-07-03: 25 ug via INTRAVENOUS
  Administered 2016-07-03: 50 ug via INTRAVENOUS

## 2016-07-03 MED ORDER — ONDANSETRON HCL 4 MG/2ML IJ SOLN
4.0000 mg | Freq: Four times a day (QID) | INTRAMUSCULAR | Status: DC | PRN
  Administered 2016-07-05: 4 mg via INTRAVENOUS

## 2016-07-03 MED ORDER — SODIUM CHLORIDE 0.9 % IV SOLN: INTRAVENOUS | Status: DC

## 2016-07-03 MED ORDER — PHENYLEPHRINE HCL 10 MG/ML IJ SOLN
INTRAMUSCULAR | Status: AC
  Filled 2016-07-03: qty 1

## 2016-07-03 MED ORDER — IPRATROPIUM-ALBUTEROL 0.5-2.5 (3) MG/3ML IN SOLN
3.0000 mL | Freq: Four times a day (QID) | RESPIRATORY_TRACT | Status: DC
  Administered 2016-07-03: 3 mL via RESPIRATORY_TRACT
  Filled 2016-07-03: qty 3

## 2016-07-03 MED ORDER — CEPASTAT 14.5 MG MT LOZG
1.0000 | LOZENGE | OROMUCOSAL | Status: DC | PRN
  Administered 2016-07-03: 1 via BUCCAL
  Filled 2016-07-03: qty 9

## 2016-07-03 MED ORDER — EPHEDRINE SULFATE 50 MG/ML IJ SOLN
INTRAMUSCULAR | Status: DC | PRN
  Administered 2016-07-03: 5 mg via INTRAVENOUS
  Administered 2016-07-03: 10 mg via INTRAVENOUS
  Administered 2016-07-03 (×2): 5 mg via INTRAVENOUS
  Administered 2016-07-03 (×3): 10 mg via INTRAVENOUS
  Administered 2016-07-03: 5 mg via INTRAVENOUS

## 2016-07-03 MED ORDER — HYDROMORPHONE HCL 1 MG/ML IJ SOLN: 1.0000 mg | Freq: Once | INTRAMUSCULAR | Status: DC | PRN

## 2016-07-03 MED ORDER — SUCCINYLCHOLINE CHLORIDE 20 MG/ML IJ SOLN
INTRAMUSCULAR | Status: AC
  Filled 2016-07-03: qty 1

## 2016-07-03 MED ORDER — HEPARIN SODIUM (PORCINE) 1000 UNIT/ML IJ SOLN
INTRAMUSCULAR | Status: DC | PRN
  Administered 2016-07-03 (×2): 2000 [IU] via INTRAVENOUS
  Administered 2016-07-03: 4000 [IU] via INTRAVENOUS
  Administered 2016-07-03: 2000 [IU] via INTRAVENOUS

## 2016-07-03 MED ORDER — MIDAZOLAM HCL 2 MG/2ML IJ SOLN
INTRAMUSCULAR | Status: AC
  Filled 2016-07-03: qty 2

## 2016-07-03 MED ORDER — FENTANYL CITRATE (PF) 100 MCG/2ML IJ SOLN
25.0000 ug | INTRAMUSCULAR | Status: DC | PRN
  Administered 2016-07-03 (×2): 50 ug via INTRAVENOUS

## 2016-07-03 MED ORDER — HYDROMORPHONE HCL 1 MG/ML IJ SOLN
0.5000 mg | INTRAMUSCULAR | Status: DC | PRN
  Administered 2016-07-03 – 2016-07-05 (×7): 0.5 mg via INTRAVENOUS
  Filled 2016-07-03 (×7): qty 0.5

## 2016-07-03 MED ORDER — OXYCODONE HCL 5 MG/5ML PO SOLN
5.0000 mg | Freq: Once | ORAL | Status: DC | PRN
  Filled 2016-07-03: qty 5

## 2016-07-03 SURGICAL SUPPLY — 60 items
BALLN LUTONIX 5X150X130 (BALLOONS) ×8
BALLN LUTONIX DCB 4X100X130 (BALLOONS) ×4
BALLN LUTONIX DCB 5X100X130 (BALLOONS) ×4
BALLN LUTONIX DCB 6X40X130 (BALLOONS) ×4
BALLN ULTRVRSE 3X100X75C (BALLOONS) ×4
BALLN ULTRVRSE 4X300X130 (BALLOONS) ×4
BALLN ULTRVRSE 5X300X130 (BALLOONS) ×4
BALLOON LUTONIX 5X150X130 (BALLOONS) ×4 IMPLANT
BALLOON LUTONIX DCB 4X100X130 (BALLOONS) ×2 IMPLANT
BALLOON LUTONIX DCB 5X100X130 (BALLOONS) ×2 IMPLANT
BALLOON LUTONIX DCB 6X40X130 (BALLOONS) ×2 IMPLANT
BALLOON ULTRVRSE 3X100X75C (BALLOONS) ×2 IMPLANT
BALLOON ULTRVRSE 4X300X130 (BALLOONS) ×2 IMPLANT
BALLOON ULTRVRSE 5X300X130 (BALLOONS) ×2 IMPLANT
BOOT SUTURE AID YELLOW STND (SUTURE) ×4 IMPLANT
CATH BEACON 5.038 65CM KMP-01 (CATHETERS) ×4 IMPLANT
CATH CXI 4F 90 DAV (CATHETERS) ×4 IMPLANT
DEVICE PRESTO INFLATION (MISCELLANEOUS) ×4 IMPLANT
DEVICE TORQUE (MISCELLANEOUS) ×4 IMPLANT
DRESSING SURGICEL FIBRLLR 1X2 (HEMOSTASIS) ×2 IMPLANT
DRSG SURGICEL FIBRILLAR 1X2 (HEMOSTASIS) ×4
ELECT CAUTERY BLADE 6.4 (BLADE) ×4 IMPLANT
ELECT REM PT RETURN 9FT ADLT (ELECTROSURGICAL) ×4
ELECTRODE REM PT RTRN 9FT ADLT (ELECTROSURGICAL) ×2 IMPLANT
EVICEL 2ML SEALANT HUMAN (Miscellaneous) ×4 IMPLANT
GLIDEWIRE STIFF .35X180X3 HYDR (WIRE) ×4 IMPLANT
GLOVE BIO SURGEON STRL SZ7 (GLOVE) ×4 IMPLANT
GLOVE SURG SYN 8.0 (GLOVE) ×4 IMPLANT
GOWN STRL REUS W/ TWL LRG LVL3 (GOWN DISPOSABLE) ×6 IMPLANT
GOWN STRL REUS W/ TWL XL LVL3 (GOWN DISPOSABLE) ×4 IMPLANT
GOWN STRL REUS W/TWL LRG LVL3 (GOWN DISPOSABLE) ×6
GOWN STRL REUS W/TWL XL LVL3 (GOWN DISPOSABLE) ×4
LOOP RED MAXI  1X406MM (MISCELLANEOUS) ×2
LOOP VESSEL MAXI 1X406 RED (MISCELLANEOUS) ×2 IMPLANT
LOOP VESSEL MINI 0.8X406 BLUE (MISCELLANEOUS) ×4 IMPLANT
LOOPS BLUE MINI 0.8X406MM (MISCELLANEOUS) ×4
NEEDLE ENTRY 21GA 7CM ECHOTIP (NEEDLE) ×4 IMPLANT
PACK ANGIOGRAPHY (CUSTOM PROCEDURE TRAY) ×4 IMPLANT
PACK BASIN MAJOR ARMC (MISCELLANEOUS) ×4 IMPLANT
PATCH CAROTID ECM VASC 1X10 (Prosthesis & Implant Heart) ×8 IMPLANT
SET INTRO CAPELLA COAXIAL (SET/KITS/TRAYS/PACK) ×4 IMPLANT
SHEATH BRITE TIP 6FRX11 (SHEATH) ×4 IMPLANT
SHEATH BRITE TIP 7FRX5.5 (SHEATH) ×4 IMPLANT
SUT MNCRL+ 5-0 UNDYED PC-3 (SUTURE) ×2 IMPLANT
SUT MONOCRYL 5-0 (SUTURE) ×2
SUT PROLENE 5 0 RB 1 DA (SUTURE) ×16 IMPLANT
SUT PROLENE 6 0 BV (SUTURE) ×92 IMPLANT
SUT SILK 2 0 (SUTURE) ×2
SUT SILK 2-0 18XBRD TIE 12 (SUTURE) ×2 IMPLANT
SUT SILK 3 0 (SUTURE) ×2
SUT SILK 3-0 18XBRD TIE 12 (SUTURE) ×2 IMPLANT
SUT SILK 4 0 (SUTURE) ×2
SUT SILK 4-0 18XBRD TIE 12 (SUTURE) ×2 IMPLANT
SUT VIC AB 2-0 CT1 27 (SUTURE) ×2
SUT VIC AB 2-0 CT1 TAPERPNT 27 (SUTURE) ×2 IMPLANT
SUT VICRYL+ 3-0 36IN CT-1 (SUTURE) ×4 IMPLANT
SYR 20CC LL (SYRINGE) ×4 IMPLANT
WIRE G 018X200 V18 (WIRE) ×4 IMPLANT
WIRE MAGIC TOR.035 180C (WIRE) ×4 IMPLANT
WIRE MAGIC TORQUE 260C (WIRE) ×4 IMPLANT

## 2016-07-03 NOTE — Progress Notes (Signed)
Patient is back from vascular procedure. Education given to patient with the interpreter regarding mobility restriction. Patient appears confused.

## 2016-07-03 NOTE — Op Note (Signed)
OPERATIVE NOTE   PROCEDURE: 1. Right common femoral, profunda femoris, and superficial femoral artery endarterectomies with separate core matrix patch angioplasty of the common and SFA and then a second patch on the profunda femoris. 2.   Introduction catheter into right posterior tibial artery with additional catheter placement within the right peroneal artery representing third order catheter placement with additional third order. 3.   Percutaneous transluminal and plasty to 3 mm right posterior tibial artery 4.   Percutaneous transluminal angioplasty of the popliteal and distal SFA to 5-6 mm with Lutonix drug-eluting balloons      PRE-OPERATIVE DIAGNOSIS: 1.Atherosclerotic occlusive disease right lower extremities with gangrene of the second and third toes 2. End-stage renal disease on hemodialysis; diabetes mellitus  POST-OPERATIVE DIAGNOSIS: Same  SURGEON: Eddie Pilar, MD  CO-surgeon: Dr. Leotis Pain  ANESTHESIA: general  ESTIMATED BLOOD LOSS: 500 cc  Fluoroscopy time 14.5 minutes  Contrast  55 cc  FINDING(S): 1. significant plaque in right common femoral, profunda femoris, and superficial femoral arteries  SPECIMEN(S): Right common femoral, profunda femoris, and superficial femoral artery plaque.  INDICATIONS:  Patient presents with gangrene of the right forefoot.  Right femoral endarterectomy is planned to try to improve perfusion. The risks and benefits as well as alternative therapies including intervention were reviewed in detail all questions were answered the patient agrees to proceed with surgery.  DESCRIPTION: After obtaining full informed written consent, the patient was brought back to the operating room and placed supine upon the operating table. The patient received IV antibiotics prior to induction. After obtaining adequate anesthesia, the patient was prepped and draped in the standard fashion appropriate time out is called.   Vertical incision  was created overlying the right femoral arteries. The common femoral artery proximally, and superficial femoral artery, and primary profunda femoris artery branches were encircled with vessel loops and prepared for control. The right femoral arteries were found to have significant plaque from the common femoral artery into the profunda and superficial femoral arteries.   4000 units of heparin was given and allowed circulate for 5 minutes. Over the course of the case 3 additional 2000 unit boluses were also given for a total of 10,000 units.  Attention is then turned to the right femoral artery. An arteriotomy is made with 11 blade and extended with Potts scissors in the common femoral artery and carried down onto the first 4 cm of the superficial femoral artery. An endarterectomy was then performed. The [facility] was used to create a plane. The proximal endpoint was cut flush with tenotomy scissors. This was in the proximal common femoral artery. An eversion endarterectomy was then performed for the first 2-3 cm of the profunda femoris. Good backbleeding was then seen.  The Cormatrix patcth is then selected and prepared for a patch angioplasty.  It is cut and beveled and started at the proximal endpoint with a 6-0 Prolene suture.  Approximately one half of the suture line is run medially and laterally and the distal end point was cut and bevelled to match the arteriotomy.  A second 6-0 Prolene was started at the distal end point and run to the mid portion to complete the arteriotomy.  The vessel was flushed prior to release of control and completion of the anastomosis.  At this point, flow was established first to the profunda femoris artery and then to the superficial femoral artery. Easily palpable pulses are noted well beyond the anastomosis and both arteries.  Having reestablished flow a micropuncture needle  was used to access the patch about two thirds of the way distal to the leading edge and a  microwire was advanced without difficulty. Micro-sheath was inserted. J-wire is advanced under fluoroscopic guidance followed by a 7 [place] sheath. Hand injection contrast was used to then demonstrate the SFA and trifurcation. Glidewire and Kumpe catheter were negotiated down into the distal popliteal and magnified hand injection contrast through the Kumpe catheter was then performed. Wire and catheter accommodation were negotiated into the peroneal and again magnified imaging of the peroneal was obtained. Attempts at crossing the occlusion with CXI catheter and the angle Glidewire and a VAT and wire were unsuccessful. And the catheter was then pulled back into the popliteal repeat magnified imaging was obtained and the wire negotiated into the posterior tibial duct difficulty. A 3 mm x 10 cm ultra versed balloon was then used to angioplasty the posterior tibial artery inflation was to 14 atm for a minute and a half. Follow-up imaging demonstrated an excellent result. 4 mm ultra versed balloon was then used and placed in the popliteal followed by 5 mm balloons in the SFA. Following these a 4 mm x 10 cm Lutonix balloon was used and plasty the popliteal from just above the origin of the posterior tibial to the level of the femoral condyles. Inflation was to 14 atm for 2 minutes.  Two 5 mm x 15 cm Lutonix drug-eluting balloons were then used followed by a 5 mm x 10 cm Lutonix drug-eluting balloon. Follow-up imaging demonstrated marked improvement with one small area in the proximal SFA which was then treated with a 6 mm x 40 mm Lutonix balloon. Following this inflation there was excellent result with less than 20% residual stenosis and in-line flow from the common femoral down to the lateral plantar vessels.  However, it was noted on the hand injection that there was absence of flow in the profunda. The dissection of the profunda was then extended for another 5 cm the profunda was then controlled proximally and  distally it was opened longitudinally with an 11 blade and extended with Potts scissors endarterectomy was performed under direct visualization with tacking of the distal edge. The core matrix patch which was a second patch was then prepped similar to that as described above and applied in the same fashion using running 6-0 Prolene. Upon flushing maneuvers there was absence of a pulse there appeared to be a kink proximally I elected to transect the origin of the profunda and perform an end to end anastomosis with the common femoral shortening it and reducing the kink. Following this maneuver which was performed with interrupted 6-0 Prolene there is now a strongly palpable pulse in the profunda femoris similar to the common femoral.   Surgicel and Evicel topical hemostatic agents were placed in the femoral incision and hemostasis was complete. The femoral incision was then closed in a layered fashion with 2 layers of 2-0 Vicryl, 2 layers of 3-0 Vicryl, an4-0 Monocryl for the skin closure. Dermabond and sterile dressing were then placed over the incision.  The patient was then awakened from anesthesia and taken to the recovery room in stable condition having tolerated the procedure well.  COMPLICATIONS: None  CONDITION: Stable     Eddie Myers 07/03/2016 2:55 PM   This note was created with Dragon Medical transcription system. Any errors in dictation are purely unintentional.

## 2016-07-03 NOTE — Transfer of Care (Signed)
Immediate Anesthesia Transfer of Care Note  Patient: Eddie Myers  Procedure(s) Performed: Procedure(s): Peripheral Vascular Balloon Angioplasty (N/A) Lower Extremity Angiography Lower Extremity Intervention  Patient Location: PACU  Anesthesia Type:General  Level of Consciousness: drowsy and patient cooperative  Airway & Oxygen Therapy: Patient Spontanous Breathing and Patient connected to face mask oxygen  Post-op Assessment: Report given to RN and Post -op Vital signs reviewed and stable  Post vital signs: Reviewed and stable  Last Vitals:  Vitals:   07/03/16 0715 07/03/16 1422  BP: 128/66 (!) 102/53  Pulse: 74 86  Resp: 20 10  Temp: 36.3 C 36.2 C    Last Pain:  Vitals:   07/03/16 1422  TempSrc: Temporal  PainSc:       Patients Stated Pain Goal: 0 (07/01/16 1833)  Complications: No apparent anesthesia complications

## 2016-07-03 NOTE — Progress Notes (Signed)
[employer] [facility] - [place] at [facility]   PATIENT NAME: Eddie Myers    MR#:  [id number]  DATE OF BIRTH:  07/09/1955  SUBJECTIVE:  CHIEF COMPLAINT:  No chief complaint on file. Patient Was seen and evaluated in preop area spanish interpreter at bedside.  REVIEW OF SYSTEMS:    Review of Systems  Constitutional: Positive for malaise/fatigue. Negative for chills and fever.  HENT: Negative for sore throat.   Eyes: Negative for blurred vision, double vision and pain.  Respiratory: Negative for cough, hemoptysis, shortness of breath and wheezing.   Cardiovascular: Negative for chest pain, palpitations, orthopnea and leg swelling.  Gastrointestinal: Negative for abdominal pain, constipation, diarrhea, heartburn, nausea and vomiting.  Genitourinary: Negative for dysuria and hematuria.  Musculoskeletal: Negative for back pain and joint pain.  Skin: Negative for rash.  Neurological: Positive for weakness. Negative for sensory change, speech change, focal weakness and headaches.  Endo/Heme/Allergies: Does not bruise/bleed easily.  Psychiatric/Behavioral: Negative for depression. The patient is not nervous/anxious.    DRUG ALLERGIES:  No Known Allergies  VITALS:  Blood pressure (!) 121/53, pulse 64, temperature 98.5 F (36.9 C), temperature source Oral, resp. rate 20, height 5\' 8"  (1.727 m), weight 80 kg (176 lb 5.9 oz), SpO2 92 %.  PHYSICAL EXAMINATION:   Physical Exam  GENERAL:  61 y.o.-year-old patient lying in the bed with no acute distress.  EYES: Pupils equal, round, reactive to light and accommodation. No scleral icterus. Extraocular muscles intact.  HEENT: Head atraumatic, normocephalic. Oropharynx and nasopharynx clear.  NECK:  Supple, no jugular venous distention. No thyroid enlargement, no tenderness.  LUNGS: Normal breath sounds bilaterally, no wheezing, rales, rhonchi. No use of accessory muscles of respiration.  CARDIOVASCULAR: S1, S2 normal. No murmurs, rubs,  or gallops.  ABDOMEN: Soft, nontender, nondistended. Bowel sounds present. No organomegaly or mass.  EXTREMITIES: Toes bilaterally are discolored with dry gangrene of the right second toe and left foot great toe NEUROLOGIC: Cranial nerves II through XII are intact. No focal Motor or sensory deficits b/l.   PSYCHIATRIC: The patient is alert and oriented x 3.  SKIN: No obvious rash, lesion, or ulcer.  LABORATORY PANEL:   CBC  Recent Labs Lab 07/03/16 1438  WBC 8.7  HGB 8.3*  HCT 25.4*  PLT 241   ------------------------------------------------------------------------------------------------------------------ Chemistries   Recent Labs Lab 07/01/16 0440  NA 134*  K 4.4  CL 97*  CO2 28  GLUCOSE 73  BUN 28*  CREATININE 7.51*  CALCIUM 8.1*   ------------------------------------------------------------------------------------------------------------------  Cardiac Enzymes No results for input(s): TROPONINI in the last 168 hours. ------------------------------------------------------------------------------------------------------------------  RADIOLOGY:  No results found.   ASSESSMENT AND PLAN:  Eddie DraftsMauro Myers  is a 61 y.o. male with a known history of End-stage renal disease on hemodialysis, diabetes, hypertension, hyperlipidemia, history of previous CVA, history of coronary artery disease status post bypass who presented to the hospital from his cardiologist's office due to bilateral lower extremity necrotic toes redness and pain.  * Fever likely due to gangrenous toes -Afebrile - PO Augmentin to finish 7 days course.  * Bilateral lower extremity cyanosis with necrotic toes --Cardiology has cleared the patient for surgery. Discussed with Dr. Lewie LoronGolan, patient's recent nuclear stress test was normal  s/p Right lower extremity angiogram -  rt common femoral endarterectomy with distal angioplasty .07/03/16  Pain control with IV and oral pain medications Status post left lower  extremity angiogram with angioplasty of left superficial femoral, popliteal and peroneal arteries.    * Subacute  right sided vision loss-patient had symptoms about a month ago.  -CT head shows a age-indeterminate right sided thalamic CVA Appreciate Neurology input MRI shows old CVA  * Paroxysmal atrial fibrillation on Coumadin.  Coumadin held for angiogram On heparin drip will change to  Lovenox to go home with for a few days if scheduled for surgery by podiatry in a week. If not scheduled for surgery then coumadin. Will follow-up with vascular surgery and podiatry  * End-stage renal disease on hemodialysis-patient gets dialysis on Tuesday Thursday Saturday Getting dialysis per nephro  * Hypotension with h/o hypertension-continue lisinopril, Hold carvedilol, Norvasc.  * Hypoglycemia with h/o Diabetes type 2 without complication-stop lantus, continue SSI,   * Hyperlipidemia-continue atorvastatin  * Throat  Pain-resolved - Tessalon Perles as needed  Follow-up with vascular surgery, podiatry, nephrology and [facility]ology [facility]   All the records are reviewed and case discussed with Care Management/Social Worker. Management plans discussed with the patient, nursing and they are in agreement.  CODE STATUS: FULL CODE  DVT Prophylaxis: SCDs  TOTAL TIME TAKING CARE OF THIS PATIENT: 35 minutes.   POSSIBLE D/C IN 2 DAYS, DEPENDING ON CLINICAL CONDITION.  Ramonita Lab M.D on 07/03/2016 at 4:18 PM  Between 7am to 6pm - Pager - [phone]  After 6pm go to [email or web address] - password [email or web address]  [facility] [place] [facility]  Office  [phone]  CC: Primary care physician; Pcp Not In System  Note: This dictation was prepared with Dragon dictation along with smaller phrase technology. Any transcriptional errors that result from this process are unintentional.

## 2016-07-03 NOTE — Progress Notes (Signed)
ANTICOAGULATION CONSULT NOTE   Pharmacy Consult for heparin Indication: atrial fibrillation  No Known Allergies  Patient Measurements: Height: 5\' 8"  (172.7 cm) Weight: 165 lb 5.5 oz (75 kg) IBW/kg (Calculated) : 68.4 Heparin Dosing Weight:   Vital Signs: Temp: 98.1 F (36.7 C) (04/27 0713) Temp Source: Oral (04/27 0713) BP: 137/63 (04/27 1053) Pulse Rate: 84 (04/27 1053)  Labs:  Recent Labs (last 2 labs)    Recent Labs  06/24/16 1524 06/25/16 0434 06/26/16 0444  HGB 9.9* 10.1* --   HCT 29.0* 29.1* --   PLT 324 265 --   LABPROT 36.2* 38.6* 17.8*  INR 3.53 3.82 1.45  CREATININE 6.66* 7.88* --       Estimated Creatinine Clearance: 9.4 mL/min (A) (by C-G formula based on SCr of 7.88 mg/dL (H)).    Assessment: 61 yom with severe pain in both feet. Planning for angiogram on Tuesday. Patient takes VKA at home for AF. Pharmacy consulted to dose heparin for AF bridge perioperatively.   Goal of Therapy: Heparin level 0.3-0.7 units/ml Monitor platelets by anticoagulation protocol: Yes  Plan: 5/1 HL = 0.53 @ 1455 which is therapeutic. Continue heparin infusion at current rate of 1600 units/hr and order confirmatory HL in 8 hours. CBC with AM labs tomorrow.         5/1 23:00 heparin level 0.51. Continue current regimen. Recheck heparin level and CBC with AM labs.                5/2 AM heparin level 0.59. Continue current regimen. Recheck heparin level and CBC with tomorrow AM labs.  5/3 AM heparin level 0.34. Continue current regimen. Recheck heparin level and CBC with tomorrow AM labs.  5/4 AM heparin level 0.34. Continue current regimen. Recheck heparin level and CBC with tomorrow AM labs.   Thank you for this consult.  Fulton ReekMatt James Lafalce, PharmD, BCPS  07/03/16 6:19 AM      Addendum:  Heparin ordered to infuse at 1600 units/hr. Last charted dose on MAR was 1300 units/hr at 0619 this morning. Confirmed with RN that heparin is currently  infusing at 1600 units/hr and rate has not been adjusted this shift so HL should reflect 1600 units/hr dose.    Sanvika Cuttino S, PharmD 07/03/16 6:19 AM

## 2016-07-03 NOTE — Anesthesia Post-op Follow-up Note (Cosign Needed)
Anesthesia QCDR form completed.        

## 2016-07-03 NOTE — Anesthesia Preprocedure Evaluation (Signed)
Anesthesia Evaluation  Patient identified by MRN, date of birth, ID band Patient awake    Reviewed: Allergy & Precautions, H&P , NPO status , Patient's Chart, lab work & pertinent test results  History of Anesthesia Complications Negative for: history of anesthetic complications  Airway Mallampati: III  TM Distance: >3 FB Neck ROM: limited    Dental  (+) Poor Dentition, Chipped, Missing   Pulmonary shortness of breath and with exertion, former smoker,    Pulmonary exam normal breath sounds clear to auscultation       Cardiovascular Exercise Tolerance: Poor hypertension, + angina + CAD and + Past MI  Normal cardiovascular exam Rhythm:regular Rate:Normal     Neuro/Psych CVA, Residual Symptoms negative psych ROS   GI/Hepatic negative GI ROS, Neg liver ROS,   Endo/Other  diabetes, Type 2, Insulin Dependent  Renal/GU Renal disease     Musculoskeletal   Abdominal   Peds  Hematology negative hematology ROS (+)   Anesthesia Other Findings Past Medical History: No date: Anginal pain (HCC) No date: Coronary artery disease No date: Diabetes mellitus without complication (HCC) No date: Dialysis patient (HCC)     Comment: Tues, Thurs, Sat No date: Dyspnea     Comment: with exertion No date: Elevated lipids No date: Enlarged prostate No date: ESRD (end stage renal disease) (HCC)     Comment: dialysis M-W-F No date: Hypertension 08/2015: Myocardial infarction (HCC) No date: Stroke [facility])     Comment: Lt side this year July  Past Surgical History: 06/26/2016: ABDOMINAL AORTOGRAM W/LOWER EXTREMITY Left     Comment: Procedure: Abdominal Aortogram w/Lower               Extremity;  Surgeon: Renford Dills, MD;                Location: [facility] INVASIVE CV LAB;  Service:               Cardiovascular;  Laterality: Left; 01/10/2016: AV FISTULA PLACEMENT Left     Comment: Procedure: INSERTION OF ARTERIOVENOUS (AV)     GORE-TEX GRAFT ARM ( BRACH / AXILLARY GRAFT );               Surgeon: Renford Dills, MD;  Location: [facility]              ORS;  Service: Vascular;  Laterality: Left; No date: CARDIAC CATHETERIZATION 01/2014: CORONARY ARTERY BYPASS GRAFT     Comment: [facility] (LIMA -> LAD and SVG -> OM) No date: INSERTION OF DIALYSIS CATHETER Right     Comment: Perma catheter 01/17/2016: PERIPHERAL VASCULAR CATHETERIZATION N/A     Comment: Procedure: Thrombectomy;  Surgeon: Renford Dills, MD;  Location: [facility] INVASIVE CV LAB;                Service: Cardiovascular;  Laterality: N/A; 01/28/2016: PERIPHERAL VASCULAR CATHETERIZATION Left     Comment: Procedure: A/V Fistulagram;  Surgeon: Renford Dills, MD;  Location: [facility] INVASIVE CV LAB;              Service: Cardiovascular;  Laterality: Left; 03/24/2016: PERIPHERAL VASCULAR CATHETERIZATION N/A     Comment: Procedure: Dialysis/Perma Catheter Removal;                Surgeon: Renford Dills, MD;  Location: [facility]  INVASIVE CV LAB;  Service: Cardiovascular;                Laterality: N/A; 01/28/2016: UPPER EXTREMITY ANGIOGRAM Left     Comment: Procedure: Upper Extremity Angiogram;                Surgeon: Renford DillsGregory G Schnier, MD;  Location: [facility]              INVASIVE CV LAB;  Service: Cardiovascular;                Laterality: Left;  BMI    Body Mass Index:  26.82 kg/m      Reproductive/Obstetrics negative OB ROS                             Anesthesia Physical Anesthesia Plan  ASA: IV  Anesthesia Plan: General   Post-op Pain Management:    Induction: Intravenous  Airway Management Planned: LMA  Additional Equipment:   Intra-op Plan:   Post-operative Plan: Extubation in OR  Informed Consent: I have reviewed the patients History and Physical, chart, labs and discussed the procedure including the risks, benefits and alternatives for the proposed anesthesia with the  patient or authorized representative who has indicated his/her understanding and acceptance.   Dental Advisory Given  Plan Discussed with: Anesthesiologist, CRNA and Surgeon  Anesthesia Plan Comments: (Patient consented for risks of anesthesia including but not limited to:  - adverse reactions to medications - damage to teeth, lips or other oral mucosa - sore throat or hoarseness - Damage to heart, brain, lungs or loss of life  Patient voiced understanding.)        Anesthesia Quick Evaluation

## 2016-07-03 NOTE — Progress Notes (Signed)
ANTICOAGULATION CONSULT NOTE - Initial Consult  Pharmacy Consult for warfarin Indication: AF  No Known Allergies  Patient Measurements: Height: 5\' 8"  (172.7 cm) Weight: 176 lb 5.9 oz (80 kg) IBW/kg (Calculated) : 68.4 Heparin Dosing Weight:   Vital Signs: Temp: 97.3 F (36.3 C) (05/04 0715) Temp Source: Oral (05/04 0715) BP: 128/66 (05/04 0715) Pulse Rate: 74 (05/04 0715)  Labs:  Recent Labs  07/01/16 0440 07/01/16 1645 07/02/16 0446 07/02/16 1759 07/03/16 0519  HGB 8.6*  --  8.1* 8.6* 7.8*  HCT 25.6*  --  24.0* 26.3* 23.3*  PLT 241  --  216 240 227  LABPROT  --  13.7 14.7  --  14.6  INR  --  1.05 1.14  --  1.13  HEPARINUNFRC 0.59  --  0.34  --  0.34  CREATININE 7.51*  --   --   --   --     Estimated Creatinine Clearance: 9.9 mL/min (A) (by C-G formula based on SCr of 7.51 mg/dL (H)).   Medical History: Past Medical History:  Diagnosis Date  . Anginal pain (HCC)   . Coronary artery disease   . Diabetes mellitus without complication (HCC)   . Dialysis patient (HCC)    Tues, Thurs, Sat  . Dyspnea    with exertion  . Elevated lipids   . Enlarged prostate   . ESRD (end stage renal disease) (HCC)    dialysis M-W-F  . Hypertension   . Myocardial infarction (HCC) 08/2015  . Stroke [facility](HCC)    Lt side this year July    Medications:  Infusions:  . [MAR Hold] sodium chloride    . [MAR Hold] sodium chloride 10 mL (07/03/16 0722)  . sodium chloride    . heparin 1,600 Units/hr (07/03/16 0024)    Assessment: 61 yom cc necrotic toes, followed by nephrology for ESRD HD, vascular surgery for angiogram (today), and hospitalist. Admitted with supratherapeutic INR, bridged with UFH for procedure. INR reversed with Vitamin K 2 mg subcutaneously on 4/26. Pharmacy consulted to resume VKA post angio.   Date INR Dose 5/2 1.05 5 mg 5/3 1.14 HELD 5/4 1.13 HELD   Goal of Therapy:  INR 2-3 Monitor platelets by anticoagulation protocol: Yes   Plan:  Endarterectomy and  distal angiogram today. Wait for vascular recommendations on VKA resumption.  Carola FrostNathan A Domenico Achord, Pharm.D., BCPS Clinical Pharmacist 07/03/2016,2:02 PM

## 2016-07-03 NOTE — Anesthesia Procedure Notes (Signed)
Procedure Name: LMA Insertion Date/Time: 07/03/2016 7:52 AM Performed by: Karoline CaldwellSTARR, Ajahnae Rathgeber Pre-anesthesia Checklist: Patient identified, Patient being monitored, Timeout performed, Emergency Drugs available and Suction available Patient Re-evaluated:Patient Re-evaluated prior to inductionOxygen Delivery Method: Circle system utilized Preoxygenation: Pre-oxygenation with 100% oxygen Intubation Type: IV induction Ventilation: Mask ventilation without difficulty LMA: LMA inserted LMA Size: 4.0 Number of attempts: 1 Placement Confirmation: positive ETCO2 and breath sounds checked- equal and bilateral Tube secured with: Tape Dental Injury: Teeth and Oropharynx as per pre-operative assessment

## 2016-07-03 NOTE — Anesthesia Postprocedure Evaluation (Signed)
Anesthesia Post Note  Patient: Eddie Myers  Procedure(s) Performed: Procedure(s) (LRB): Peripheral Vascular Balloon Angioplasty (N/A) Lower Extremity Angiography Lower Extremity Intervention  Patient location during evaluation: PACU Anesthesia Type: General Level of consciousness: awake and alert Pain management: pain level controlled Vital Signs Assessment: post-procedure vital signs reviewed and stable Respiratory status: spontaneous breathing, nonlabored ventilation, respiratory function stable and patient connected to nasal cannula oxygen Cardiovascular status: blood pressure returned to baseline and stable Postop Assessment: no signs of nausea or vomiting Anesthetic complications: no     Last Vitals:  Vitals:   07/03/16 1448 07/03/16 1452  BP:  (!) 114/57  Pulse: (!) 122 90  Resp: (!) 9 10  Temp:      Last Pain:  Vitals:   07/03/16 1448  TempSrc:   PainSc: 6                  Cleda MccreedyJoseph K Ariz Terrones

## 2016-07-04 LAB — HEPARIN LEVEL (UNFRACTIONATED): Heparin Unfractionated: 0.1 [IU]/mL — ABNORMAL LOW (ref 0.30–0.70)

## 2016-07-04 LAB — CBC
HEMATOCRIT: 24 % — AB (ref 40.0–52.0)
HEMOGLOBIN: 7.9 g/dL — AB (ref 13.0–18.0)
MCH: 28.9 pg (ref 26.0–34.0)
MCHC: 33 g/dL (ref 32.0–36.0)
MCV: 87.7 fL (ref 80.0–100.0)
Platelets: 255 10*3/uL (ref 150–440)
RBC: 2.73 MIL/uL — AB (ref 4.40–5.90)
RDW: 15.5 % — AB (ref 11.5–14.5)
WBC: 9.2 10*3/uL (ref 3.8–10.6)

## 2016-07-04 LAB — BASIC METABOLIC PANEL
ANION GAP: 8 (ref 5–15)
BUN: 33 mg/dL — ABNORMAL HIGH (ref 6–20)
CALCIUM: 7.7 mg/dL — AB (ref 8.9–10.3)
CO2: 24 mmol/L (ref 22–32)
Chloride: 100 mmol/L — ABNORMAL LOW (ref 101–111)
Creatinine, Ser: 7.83 mg/dL — ABNORMAL HIGH (ref 0.61–1.24)
GFR calc non Af Amer: 7 mL/min — ABNORMAL LOW (ref >60)
GFR, EST AFRICAN AMERICAN: 8 mL/min — AB (ref >60)
Glucose, Bld: 154 mg/dL — ABNORMAL HIGH (ref 65–99)
POTASSIUM: 5.3 mmol/L — AB (ref 3.5–5.1)
Sodium: 132 mmol/L — ABNORMAL LOW (ref 135–145)

## 2016-07-04 LAB — PROTIME-INR
INR: 1.24
Prothrombin Time: 15.7 seconds — ABNORMAL HIGH (ref 11.4–15.2)

## 2016-07-04 LAB — GLUCOSE, CAPILLARY
Glucose-Capillary: 155 mg/dL — ABNORMAL HIGH (ref 65–99)
Glucose-Capillary: 160 mg/dL — ABNORMAL HIGH (ref 65–99)
Glucose-Capillary: 163 mg/dL — ABNORMAL HIGH (ref 65–99)

## 2016-07-04 LAB — APTT: APTT: 43 s — AB (ref 24–36)

## 2016-07-04 MED ORDER — HEPARIN (PORCINE) IN NACL 100-0.45 UNIT/ML-% IJ SOLN
1800.0000 [IU]/h | INTRAMUSCULAR | Status: DC
  Administered 2016-07-04: 1200 [IU]/h via INTRAVENOUS
  Administered 2016-07-05: 1500 [IU]/h via INTRAVENOUS
  Administered 2016-07-06: 1800 [IU]/h via INTRAVENOUS
  Filled 2016-07-04 (×4): qty 250

## 2016-07-04 MED ORDER — ENOXAPARIN SODIUM 80 MG/0.8ML ~~LOC~~ SOLN: 1.0000 mg/kg | Freq: Two times a day (BID) | SUBCUTANEOUS | Status: DC

## 2016-07-04 MED ORDER — WARFARIN SODIUM 5 MG PO TABS
5.0000 mg | ORAL_TABLET | Freq: Once | ORAL | Status: AC
  Administered 2016-07-04: 5 mg via ORAL
  Filled 2016-07-04: qty 1

## 2016-07-04 MED ORDER — WARFARIN - PHARMACIST DOSING INPATIENT
Freq: Every day | Status: DC
  Administered 2016-07-04 – 2016-07-05 (×2)

## 2016-07-04 NOTE — Progress Notes (Signed)
HD STARTED  

## 2016-07-04 NOTE — Progress Notes (Signed)
PRE DIALYSIS ASSESSMENT 

## 2016-07-04 NOTE — Op Note (Signed)
OPERATIVE NOTE   PROCEDURE: 1. 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 DM 3. ESRD  POST-OPERATIVE DIAGNOSIS: Same  SURGEON: Festus BarrenJason Danica Camarena, MD  CO-surgeon: Levora DredgeGregory Schnier, MD  ANESTHESIA: general  ESTIMATED BLOOD LOSS: 500 cc  FINDING(S): 1. significant plaque in right common femoral, profunda femoris, and superficial femoral arteries  SPECIMEN(S): Right common femoral, profunda femoris, and superficial femoral artery plaque.  INDICATIONS:  Patient presents with severe PAD with gangrenous changes to the toes on the right foot.  Right femoral endarterectomy is planned to try to improve perfusion. The risks and benefits as well as alternative therapies including intervention were reviewed in detail all questions were answered the patient agrees to proceed with surgery.  DESCRIPTION: After obtaining full informed written consent, the patient was brought back to the operating room and placed supine upon the operating table. The patient received IV antibiotics prior to induction. After obtaining adequate anesthesia, the patient was prepped and draped in the standard fashion appropriate time out is called.   Vertical incision was created overlying the right femoral arteries. The common femoral artery proximally, and superficial femoral artery, and primary profunda femoris artery branches were encircled with vessel loops and prepared for control. The right femoral arteries were found to have significant plaque from the common femoral artery into the profunda and superficial femoral arteries.   4000 units of heparin was given and allowed circulate for 5 minutes. Additional 2000 units doses were given on several occasions  during the procedure for a total of 10,000 units given   Attention is then turned to the right femoral artery. An arteriotomy is made with 11 blade and extended with Potts scissors in the common femoral artery and carried down onto the first 4-5 cm of the superficial femoral artery. An endarterectomy was then performed. The [facility] elevator was used to create a plane. The proximal endpoint was cut flush with tenotomy scissors. This was in the proximal common femoral artery. An eversion endarterectomy was then performed for the first 2 cm of the profunda femorus artery which had two early branches.  The largest, more medial portion was addressed. Good backbleeding was then seen. The distal endpoint of the SFA endarterectomy was created with gentle traction and cut flush with tenotomy scissors and the distal endpoint was tacked down with three 7-0 Prolene sutures. The Cormatrix patcth is then selected and prepared for a patch angioplasty.  It is cut and beveled and started at the proximal endpoint with a 6-0 Prolene suture.  Approximately one half of the suture line is run medially and laterally and the distal end point was cut and bevelled to match the arteriotomy.  A second 6-0 Prolene was started at the distal end point and run to the mid portion to complete the arteriotomy.  The vessel was flushed prior to release of control and completion of the anastomosis.  At this point, flow was established first to the profunda femoris artery and then to the superficial femoral artery. Easily palpable pulses are noted well beyond the anastomosis.  Dr. Gilda CreaseSchnier was then the primary surgeon performing an endovascular portion of this procedure next, and he will dictate that portion of the procedure.  After the endovascular portion, it was noted the profunda femorus artery did not have significant flow beyond the  endarterectomy site.  This profunda femorus artery was then dissected out another 5-6 cm and control was  obtained with a Cooley clam proximally p to keep inline flow through the SFA and a profunda clamp distally.  The profunda was then opened up with an anterior wall arteriotomy with an 11 Blade and extended with Potts scissors.  The endarterectomy was performed with the elevator and a nice feathered distal edge was created and tacked down with 7-0 Prolene sutures.  A Cormatrix patch was then cut and beveled to fit the arteriotomy and a patch angioplasty was performed with a 6-0 Prolene.  Upon release of control there was a kink at the proximal edge of the profunda.  We elected to perform an end to end anastomosis of the proximal profunda femorus artery to the common femoral artery to reduce the kink and shorten the artery.  This was done with a series of interrupted 6-0 Prolene sutures.  After this, there was a strong pulse in the profunda femorus artery beyond the suture line and the SFA flow remained strong.  Surgicel and Evicel topical hemostatic agents were placed in the femoral incision and hemostasis was complete. The femoral incision was then closed in a layered fashion with 2 layers of 2-0 Vicryl, 2 layers of 3-0 Vicryl, and 4-0 Monocryl for the skin closure. Dermabond and sterile dressing were then placed over the incision.  The patient was then awakened from anesthesia and taken to the recovery room in stable condition having tolerated the procedure well.  COMPLICATIONS: None  CONDITION: Stable     Festus Barren 07/04/2016 11:21 AM   This note was created with Dragon Medical transcription system. Any errors in dictation are purely unintentional.

## 2016-07-04 NOTE — Progress Notes (Signed)
ANTICOAGULATION CONSULT NOTE - Initial Consult  Pharmacy Consult for heparin drip Indication: atrial fibrillation  No Known Allergies  Patient Measurements: Height: 5\' 8"  (172.7 cm) Weight: 176 lb 5.9 oz (80 kg) IBW/kg (Calculated) : 68.4 Heparin Dosing Weight: 80 kg  Vital Signs: Temp: 98.2 F (36.8 C) (05/05 0413) Temp Source: Oral (05/05 0413) BP: 135/64 (05/05 0413) Pulse Rate: 100 (05/05 0413)  Labs:  Recent Labs  07/02/16 0446  07/03/16 0519 07/03/16 1438 07/04/16 0505  HGB 8.1*  < > 7.8* 8.3* 7.9*  HCT 24.0*  < > 23.3* 25.4* 24.0*  PLT 216  < > 227 241 255  LABPROT 14.7  --  14.6  --  15.7*  INR 1.14  --  1.13  --  1.24  HEPARINUNFRC 0.34  --  0.34  --   --   CREATININE  --   --   --   --  7.83*  < > = values in this interval not displayed.  Estimated Creatinine Clearance: 9.5 mL/min (A) (by C-G formula based on SCr of 7.83 mg/dL (H)).   Medical History: Past Medical History:  Diagnosis Date  . Anginal pain (HCC)   . Coronary artery disease   . Diabetes mellitus without complication (HCC)   . Dialysis patient (HCC)    Tues, Thurs, Sat  . Dyspnea    with exertion  . Elevated lipids   . Enlarged prostate   . ESRD (end stage renal disease) (HCC)    dialysis M-W-F  . Hypertension   . Myocardial infarction (HCC) 08/2015  . Stroke [facility](HCC)    Lt side this year July    Medications:  Scheduled:  . amoxicillin-clavulanate  1 tablet Oral Q24H  . aspirin EC  81 mg Oral Daily  . atorvastatin  80 mg Oral QPM  . clopidogrel  75 mg Oral Daily  . epoetin (EPOGEN/PROCRIT) injection  4,000 Units Intravenous Q T,Th,Sa-HD  . ferric citrate  420 mg Oral TID WC  . insulin aspart  0-5 Units Subcutaneous QHS  . insulin aspart  0-9 Units Subcutaneous TID WC  . lisinopril  2.5 mg Oral Daily  . living well with diabetes book   Does not apply Once  . Melatonin  5 mg Oral QHS  . pneumococcal 23 valent vaccine  0.5 mL Intramuscular Tomorrow-1000  . senna  1 tablet Oral  Daily  . tamsulosin  0.4 mg Oral QHS   Infusions:  . heparin      Assessment: Pharmacy consulted to dose heparin in this [age] yoF ESRD on HD with afib. Patient was receiving heparin until surgery on 5/4, heparin is now being restarted (~18 hours post-op).  Goal of Therapy:  Heparin level 0.3-0.7 units/ml Monitor platelets by anticoagulation protocol: Yes   Plan:  Give 0 units bolus x 1 Start heparin infusion at 1200 units/hr Check anti-Xa level in 8 hours and daily while on heparin Continue to monitor H&H and platelets  Horris LatinoHolly Chesley Valls, PharmD Pharmacy Resident 07/04/2016 10:01 AM

## 2016-07-04 NOTE — Progress Notes (Signed)
POST DIALYSIS ASSESSMENT 

## 2016-07-04 NOTE — Progress Notes (Signed)
ANTICOAGULATION CONSULT NOTE - Initial Consult  Pharmacy Consult for warfarin Indication: AF  No Known Allergies  Patient Measurements: Height: 5\' 8"  (172.7 cm) Weight: 177 lb 14.6 oz (80.7 kg) IBW/kg (Calculated) : 68.4 Heparin Dosing Weight: 80 kg  Vital Signs: Temp: 98.4 F (36.9 C) (05/05 1432) Temp Source: Oral (05/05 1432) BP: 116/61 (05/05 1432) Pulse Rate: 116 (05/05 1432)  Labs:  Recent Labs  07/02/16 0446  07/03/16 0519 07/03/16 1438 07/04/16 0505 07/04/16 1053  HGB 8.1*  < > 7.8* 8.3* 7.9*  --   HCT 24.0*  < > 23.3* 25.4* 24.0*  --   PLT 216  < > 227 241 255  --   APTT  --   --   --   --   --  43*  LABPROT 14.7  --  14.6  --  15.7*  --   INR 1.14  --  1.13  --  1.24  --   HEPARINUNFRC 0.34  --  0.34  --   --   --   CREATININE  --   --   --   --  7.83*  --   < > = values in this interval not displayed.  Estimated Creatinine Clearance: 9.5 mL/min (A) (by C-G formula based on SCr of 7.83 mg/dL (H)).   Medical History: Past Medical History:  Diagnosis Date  . Anginal pain (HCC)   . Coronary artery disease   . Diabetes mellitus without complication (HCC)   . Dialysis patient (HCC)    Tues, Thurs, Sat  . Dyspnea    with exertion  . Elevated lipids   . Enlarged prostate   . ESRD (end stage renal disease) (HCC)    dialysis M-W-F  . Hypertension   . Myocardial infarction (HCC) 08/2015  . Stroke [facility](HCC)    Lt side this year July    Medications:  Infusions:  . heparin 1,200 Units/hr (07/04/16 1457)    Assessment: 61 yom cc necrotic toes, followed by nephrology for ESRD HD, vascular surgery for angiogram (5/4), and hospitalist. Admitted with supratherapeutic INR (3.5), bridged with UFH for procedure. INR reversed with Vitamin K 2 mg subcutaneously on 4/26. Pharmacy consulted to resume VKA post angio. Patient also started on heparin drip 5/5.  Per PTA med list, patient takes warfarin 5 mg daily but is supposed to begin taking 2.5 mg on Monday and  Friday.  Date INR Dose 5/2 1.05 5 mg 5/3 1.14 HELD 5/4 1.13 HELD 5/5 1.24 5 mg   Goal of Therapy:  INR 2-3 Monitor platelets by anticoagulation protocol: Yes   Plan:  Will give warfarin 5 mg x 1 will follow PT/INR and dose appropriately   Horris LatinoHolly Rozalia Dino, PharmD Pharmacy Resident 07/04/2016 3:15 PM

## 2016-07-04 NOTE — Progress Notes (Signed)
HD COMPLETED  

## 2016-07-04 NOTE — Progress Notes (Signed)
[facility]  ROUNDING NOTE   Subjective:   History taken with assistance of Spanish interpeter. Hemodialysis for later today.   Angioplasty yesterday.    Objective:  Vital signs in last 24 hours:  Temp:  [97.2 F (36.2 C)-98.7 F (37.1 C)] 98.2 F (36.8 C) (05/05 0413) Pulse Rate:  [64-122] 100 (05/05 0413) Resp:  [9-20] 16 (05/05 0413) BP: (102-135)/(52-94) 135/64 (05/05 0413) SpO2:  [90 %-100 %] 98 % (05/05 0413)  Weight change: -0.8 kg (-1 lb 12.2 oz) Filed Weights   07/02/16 1018 07/02/16 1330 07/03/16 0715  Weight: 80.8 kg (178 lb 2.1 oz) 79.3 kg (174 lb 13.2 oz) 80 kg (176 lb 5.9 oz)    Intake/Output: I/O last 3 completed shifts: In: 1102 [P.O.:60; I.V.:700; Blood:300; IV Piggyback:42] Out: 900 [Urine:400; Blood:500]   Intake/Output this shift:  Total I/O In: 240 [P.O.:240] Out: -   Physical Exam: General: No acute distress  Head: Normocephalic, atraumatic. Moist oral mucosal membranes  Eyes: Anicteric  Neck: Supple, trachea midline  Lungs:  Clear to auscultation, normal effort  Heart: S1S2 no rubs  Abdomen:  Soft, nontender, BS present  Extremities: No peripheral edema. Gangrene left 1st, 2nd and 3rd toe, right 2nd toe  Neurologic: Nonfocal, moving all four extremities  Skin: No lesions  Access: LUE AVF    Basic Metabolic Panel:  Recent Labs Lab 06/27/16 1610 06/29/16 1616 07/01/16 0440 07/04/16 0505  NA  --  136 134* 132*  K  --  3.9 4.4 5.3*  CL  --  98* 97* 100*  CO2  --  29 28 24   GLUCOSE  --  158* 73 154*  BUN  --  14 28* 33*  CREATININE  --  3.69* 7.51* 7.83*  CALCIUM  --  8.2* 8.1* 7.7*  PHOS 5.3* 2.8  --   --     Liver Function Tests:  Recent Labs Lab 06/29/16 1616  ALBUMIN 2.7*   No results for input(s): LIPASE, AMYLASE in the last 168 hours. No results for input(s): AMMONIA in the last 168 hours.  CBC:  Recent Labs Lab 07/02/16 0446 07/02/16 1759 07/03/16 0519 07/03/16 1438 07/04/16 0505  WBC 7.0  8.1 8.1 8.7 9.2  NEUTROABS  --  6.8*  --  5.9  --   HGB 8.1* 8.6* 7.8* 8.3* 7.9*  HCT 24.0* 26.3* 23.3* 25.4* 24.0*  MCV 89.0 89.3 88.1 88.6 87.7  PLT 216 240 227 241 255    Cardiac Enzymes: No results for input(s): CKTOTAL, CKMB, CKMBINDEX, TROPONINI in the last 168 hours.  BNP: Invalid input(s): POCBNP  CBG:  Recent Labs Lab 07/03/16 1427 07/03/16 1537 07/03/16 1656 07/03/16 2112 07/04/16 0741  GLUCAP 114* 137* 137* 141* 155*    Microbiology: Results for orders placed or performed during the hospital encounter of 06/24/16  MRSA PCR Screening     Status: None   Collection Time: 06/24/16  5:05 AM  Result Value Ref Range Status   MRSA by PCR NEGATIVE NEGATIVE Final    Comment:        The GeneXpert MRSA Assay (FDA approved for NASAL specimens only), is one component of a comprehensive MRSA colonization surveillance program. It is not intended to diagnose MRSA infection nor to guide or monitor treatment for MRSA infections.   CULTURE, BLOOD (ROUTINE X 2) w Reflex to ID Panel     Status: None   Collection Time: 06/27/16  7:22 AM  Result Value Ref Range Status   Specimen Description BLOOD  RIGHT HAND  Final   Special Requests   Final    BOTTLES DRAWN AEROBIC AND ANAEROBIC Blood Culture adequate volume   Culture NO GROWTH 5 DAYS  Final   Report Status 07/02/2016 FINAL  Final  CULTURE, BLOOD (ROUTINE X 2) w Reflex to ID Panel     Status: None   Collection Time: 06/27/16  7:29 AM  Result Value Ref Range Status   Specimen Description BLOOD RIGHT ASSIST CONTROL  Final   Special Requests   Final    BOTTLES DRAWN AEROBIC AND ANAEROBIC Blood Culture adequate volume   Culture NO GROWTH 5 DAYS  Final   Report Status 07/02/2016 FINAL  Final    Coagulation Studies:  Recent Labs  07/01/16 1645 07/02/16 0446 07/03/16 0519 07/04/16 0505  LABPROT 13.7 14.7 14.6 15.7*  INR 1.05 1.14 1.13 1.24    Urinalysis: No results for input(s): COLORURINE, LABSPEC, PHURINE,  GLUCOSEU, HGBUR, BILIRUBINUR, KETONESUR, PROTEINUR, UROBILINOGEN, NITRITE, LEUKOCYTESUR in the last 72 hours.  Invalid input(s): APPERANCEUR    Imaging: No results found.   Medications:   . heparin     . amoxicillin-clavulanate  1 tablet Oral Q24H  . aspirin EC  81 mg Oral Daily  . atorvastatin  80 mg Oral QPM  . clopidogrel  75 mg Oral Daily  . epoetin (EPOGEN/PROCRIT) injection  4,000 Units Intravenous Q T,Th,Sa-HD  . ferric citrate  420 mg Oral TID WC  . insulin aspart  0-5 Units Subcutaneous QHS  . insulin aspart  0-9 Units Subcutaneous TID WC  . lisinopril  2.5 mg Oral Daily  . living well with diabetes book   Does not apply Once  . Melatonin  5 mg Oral QHS  . pneumococcal 23 valent vaccine  0.5 mL Intramuscular Tomorrow-1000  . senna  1 tablet Oral Daily  . tamsulosin  0.4 mg Oral QHS   acetaminophen **OR** acetaminophen, benzonatate, bisacodyl, heparin, HYDROmorphone (DILAUDID) injection, ipratropium-albuterol, lidocaine (PF), lidocaine-prilocaine, nitroGLYCERIN, ondansetron **OR** ondansetron (ZOFRAN) IV, ondansetron (ZOFRAN) IV, oxyCODONE, pentafluoroprop-tetrafluoroeth, phenol, phenol-menthol, polyethylene glycol  Assessment/ Plan:  61 y.o. Hispanic male ESRD on HD TTS, coronary artery disease, diabetes mellitus type 2, hyperlipidemia, hypertension, history of CVA, anemia chronic kidney disease, secondary hyperparathyroidism who presents with gangrenous changes on toes of both feet.  [facility] TTS [address] Left AVF   1.  ESRD on HD TTS:  Continue TTS schedule.   Dialysis for later today  2.  Anemia chronic kidney disease:  Hemoglobin 7.9 - Epogen with dialysis.  3.  Secondary hyperparathyroidism.  Phosphorus at goal - Auryxia with meals.   4.  Hypertension: blood pressure at goal.  - Continue amlodipine,carvedilol, and lisinopril.  5.   Peripheral arterial disease:  Status post right lower extremity endarterectomies on 5/4 Dr. Gilda Crease.   LOS:  10 Donna Snooks 5/5/201810:10 AM

## 2016-07-04 NOTE — Progress Notes (Signed)
1 Day Post-Op   Subjective/Chief Complaint: Doing OK. Pain moderately controlled. Mostly at right groin.   Objective: Vital signs in last 24 hours: Temp:  [97.2 F (36.2 C)-98.7 F (37.1 C)] 98.2 F (36.8 C) (05/05 0413) Pulse Rate:  [64-122] 100 (05/05 0413) Resp:  [9-20] 16 (05/05 0413) BP: (102-135)/(52-94) 135/64 (05/05 0413) SpO2:  [90 %-100 %] 98 % (05/05 0413) Last BM Date: 07/02/16  Intake/Output from previous day: 05/04 0701 - 05/05 0700 In: 1102 [P.O.:60; I.V.:700; Blood:300; IV Piggyback:42] Out: 900 [Urine:400; Blood:500] Intake/Output this shift: No intake/output data recorded.  General appearance: alert and mild distress Extremities: Right: mild edema, toes necrotic-baseline, mild erythema, warm Pulses: +PT Skin: as above Incision/Wound:  Lab Results:   Recent Labs  07/03/16 1438 07/04/16 0505  WBC 8.7 9.2  HGB 8.3* 7.9*  HCT 25.4* 24.0*  PLT 241 255   BMET  Recent Labs  07/04/16 0505  NA 132*  K 5.3*  CL 100*  CO2 24  GLUCOSE 154*  BUN 33*  CREATININE 7.83*  CALCIUM 7.7*   PT/INR  Recent Labs  07/03/16 0519 07/04/16 0505  LABPROT 14.6 15.7*  INR 1.13 1.24   ABG No results for input(s): PHART, HCO3 in the last 72 hours.  Invalid input(s): PCO2, PO2  Studies/Results: No results found.  Anti-infectives: Anti-infectives    Start     Dose/Rate Route Frequency Ordered Stop   07/03/16 1546  ceFAZolin (ANCEF) IVPB 1 g/50 mL premix     1 g 100 mL/hr over 30 Minutes Intravenous Every 12 hours 07/03/16 1536 07/04/16 2159   07/03/16 0600  ceFAZolin (ANCEF) IVPB 1 g/50 mL premix    Comments:  Send with pt to OR   1 g 100 mL/hr over 30 Minutes Intravenous On call 07/02/16 1250 07/03/16 0814   07/01/16 0500  ceFAZolin (ANCEF) IVPB 1 g/50 mL premix    Comments:  Send with pt to OR   1 g 100 mL/hr over 30 Minutes Intravenous On call 06/30/16 1828 07/02/16 0500   06/30/16 1200  amoxicillin-clavulanate (AUGMENTIN) 500-125 MG per  tablet 500 mg     1 tablet Oral Every 24 hours 06/30/16 0951     06/30/16 1000  amoxicillin-clavulanate (AUGMENTIN) 500-125 MG per tablet 500 mg  Status:  Discontinued     1 tablet Oral Every 12 hours 06/30/16 0726 06/30/16 0951   06/28/16 1000  piperacillin-tazobactam (ZOSYN) IVPB 3.375 g  Status:  Discontinued     3.375 g 12.5 mL/hr over 240 Minutes Intravenous Every 12 hours 06/28/16 0736 06/30/16 0726   06/27/16 1200  vancomycin (VANCOCIN) IVPB 750 mg/150 ml premix  Status:  Discontinued     750 mg 150 mL/hr over 60 Minutes Intravenous Every T-Th-Sa (Hemodialysis) 06/27/16 1122 06/27/16 1127   06/27/16 1200  vancomycin (VANCOCIN) 750 mg in sodium chloride 0.9 % 150 mL IVPB  Status:  Discontinued     750 mg 150 mL/hr over 60 Minutes Intravenous Every T-Th-Sa (Hemodialysis) 06/27/16 1127 06/30/16 0726   06/27/16 1100  vancomycin (VANCOCIN) 1,750 mg in sodium chloride 0.9 % 500 mL IVPB     1,750 mg 250 mL/hr over 120 Minutes Intravenous  Once 06/27/16 1055 06/27/16 1411   06/26/16 0730  ceFAZolin (ANCEF) IVPB 1 g/50 mL premix     1 g 100 mL/hr over 30 Minutes Intravenous  Once 06/26/16 0722 06/26/16 0842      Assessment/Plan: s/p Procedure(s): Peripheral Vascular Balloon Angioplasty (N/A) Lower Extremity Angiography Lower Extremity Intervention Continue  Plavix  OOB to chair/ambulate Begin PT as tolerated  LOS: 10 days    Eli Hose A 07/04/2016

## 2016-07-04 NOTE — Progress Notes (Signed)
[facility] - [place] [facility]   PATIENT NAME: Eddie Myers    MR#:  [id number]  DATE OF BIRTH:  06/17/1955  SUBJECTIVE:  CHIEF COMPLAINT:  No chief complaint on file. PatientHad a right-sided endarterectomy yesterday. No complaints today REVIEW OF SYSTEMS:    Review of Systems  Constitutional: Positive for malaise/fatigue. Negative for chills and fever.  HENT: Negative for sore throat.   Eyes: Negative for blurred vision, double vision and pain.  Respiratory: Negative for cough, hemoptysis, shortness of breath and wheezing.   Cardiovascular: Negative for chest pain, palpitations, orthopnea and leg swelling.  Gastrointestinal: Negative for abdominal pain, constipation, diarrhea, heartburn, nausea and vomiting.  Genitourinary: Negative for dysuria and hematuria.  Musculoskeletal: Negative for back pain and joint pain.  Skin: Negative for rash.  Neurological: Positive for weakness. Negative for sensory change, speech change, focal weakness and headaches.  Endo/Heme/Allergies: Does not bruise/bleed easily.  Psychiatric/Behavioral: Negative for depression. The patient is not nervous/anxious.    DRUG ALLERGIES:  No Known Allergies  VITALS:  Blood pressure 124/69, pulse (!) 103, temperature 98.9 F (37.2 C), temperature source Oral, resp. rate (!) 9, height 5\' 8"  (1.727 m), weight 80.7 kg (177 lb 14.6 oz), SpO2 98 %.  PHYSICAL EXAMINATION:   Physical Exam  GENERAL:  61 y.o.-year-old patient lying in the bed with no acute distress.  EYES: Pupils equal, round, reactive to light and accommodation. No scleral icterus. Extraocular muscles intact.  HEENT: Head atraumatic, normocephalic. Oropharynx and nasopharynx clear.  NECK:  Supple, no jugular venous distention. No thyroid enlargement, no tenderness.  LUNGS: Normal breath sounds bilaterally, no wheezing, rales, rhonchi. No use of accessory muscles of respiration.  CARDIOVASCULAR: S1, S2 normal. No murmurs, rubs,  or gallops.  ABDOMEN: Soft, nontender, nondistended. Bowel sounds present. No organomegaly or mass.  EXTREMITIES: Toes bilaterally are discolored with dry gangrene of the right second toe and left foot great toe NEUROLOGIC: Cranial nerves II through XII are intact. No focal Motor or sensory deficits b/l.   PSYCHIATRIC: The patient is alert and oriented x 3.  SKIN: No obvious rash, lesion, or ulcer.  LABORATORY PANEL:   CBC  Recent Labs Lab 07/04/16 0505  WBC 9.2  HGB 7.9*  HCT 24.0*  PLT 255   ------------------------------------------------------------------------------------------------------------------ Chemistries   Recent Labs Lab 07/04/16 0505  NA 132*  K 5.3*  CL 100*  CO2 24  GLUCOSE 154*  BUN 33*  CREATININE 7.83*  CALCIUM 7.7*   ------------------------------------------------------------------------------------------------------------------  Cardiac Enzymes No results for input(s): TROPONINI in the last 168 hours. ------------------------------------------------------------------------------------------------------------------  RADIOLOGY:  No results found.   ASSESSMENT AND PLAN:  Eddie Myers  is a 61 y.o. male with a known history of End-stage renal disease on hemodialysis, diabetes, hypertension, hyperlipidemia, history of previous CVA, history of coronary artery disease status post bypass who presented to the hospital from his cardiologist's office due to bilateral lower extremity necrotic toes redness and pain.  * Fever likely due to gangrenous toes -Afebrile - PO Augmentin to finish 7 days course.  * Bilateral lower extremity cyanosis with necrotic toes --Cardiology has cleared the patient for surgery. Discussed with Dr. Lewie LoronGolan, patient's recent nuclear stress test was normal  s/p Right lower extremity angiogram -  rt common femoral endarterectomy 07/04/16 with distal angioplasty .07/03/16 -On aspirin 81 mg, Plavix 75 mg and heparin drip while waiting  for podiatry consult -Podiatry consult is placed. Resumption of Coumadin based on podiatry plans  Pain control with IV and oral  pain medications Status post left lower extremity angiogram with angioplasty of left superficial femoral, popliteal and peroneal arteries.  * Subacute right sided vision loss-patient had symptoms about a month ago.  -CT head shows a age-indeterminate right sided thalamic CVA Appreciate Neurology input MRI shows old CVA  * Paroxysmal atrial fibrillation on Coumadin.  Coumadin held for angiogram On heparin drip , will start the patient on Coumadin if no surgery plans by podiatry in a week. Will follow-up with vascular surgery and podiatry  * End-stage renal disease on hemodialysis-patient gets dialysis on Tuesday Thursday Saturday Getting dialysis per nephro  * Hypotension with h/o hypertension-continue lisinopril, Hold carvedilol, Norvasc.  * Hypoglycemia with h/o Diabetes type 2 without complication-stop lantus, continue SSI,   * Hyperlipidemia-continue atorvastatin  * Throat  Pain-resolved - Tessalon Perles as needed  Follow-up with vascular surgery, podiatry, nephrology and [facility]ology [facility]   All the records are reviewed and case discussed with Care Management/Social Worker. Management plans discussed with the patient with the help of Spanish interpreter, nursing and they are in agreement.  CODE STATUS: FULL CODE  DVT Prophylaxis: SCDs  TOTAL TIME TAKING CARE OF THIS PATIENT: 35 minutes.   POSSIBLE D/C IN 2 DAYS, DEPENDING ON CLINICAL CONDITION.  Ramonita Lab M.D on 07/04/2016 at 1:15 PM  Between 7am to 6pm - Pager - [phone]  After 6pm go to [email or web address] - password [email or web address]  [facility]  Office  [phone]  CC: Primary care physician; System, Pcp Not In  Note: This dictation was prepared with Dragon dictation along with smaller phrase technology. Any transcriptional errors that result from this process  are unintentional.

## 2016-07-05 LAB — GLUCOSE, CAPILLARY
GLUCOSE-CAPILLARY: 167 mg/dL — AB (ref 65–99)
Glucose-Capillary: 164 mg/dL — ABNORMAL HIGH (ref 65–99)
Glucose-Capillary: 174 mg/dL — ABNORMAL HIGH (ref 65–99)
Glucose-Capillary: 136 mg/dL — ABNORMAL HIGH (ref 65–99)

## 2016-07-05 LAB — PROTIME-INR
INR: 1.52
Prothrombin Time: 18.5 seconds — ABNORMAL HIGH (ref 11.4–15.2)

## 2016-07-05 LAB — HEPARIN LEVEL (UNFRACTIONATED): Heparin Unfractionated: 0.16 IU/mL — ABNORMAL LOW (ref 0.30–0.70)

## 2016-07-05 LAB — APTT: aPTT: 109 seconds — ABNORMAL HIGH (ref 24–36)

## 2016-07-05 MED ORDER — HEPARIN BOLUS VIA INFUSION
2400.0000 [IU] | Freq: Once | INTRAVENOUS | Status: AC
  Administered 2016-07-05: 2400 [IU] via INTRAVENOUS
  Filled 2016-07-05: qty 2400

## 2016-07-05 MED ORDER — WARFARIN SODIUM 5 MG PO TABS
5.0000 mg | ORAL_TABLET | Freq: Once | ORAL | Status: AC
  Administered 2016-07-05: 5 mg via ORAL
  Filled 2016-07-05: qty 1

## 2016-07-05 MED ORDER — OXYCODONE HCL 5 MG PO TABS
10.0000 mg | ORAL_TABLET | ORAL | Status: DC | PRN
  Administered 2016-07-05 – 2016-07-08 (×5): 10 mg via ORAL
  Filled 2016-07-05 (×6): qty 2

## 2016-07-05 MED ORDER — EPOETIN ALFA 10000 UNIT/ML IJ SOLN
10000.0000 [IU] | INTRAMUSCULAR | Status: DC
  Administered 2016-07-07: 10000 [IU] via INTRAVENOUS

## 2016-07-05 MED ORDER — MORPHINE SULFATE (PF) 2 MG/ML IV SOLN
2.0000 mg | INTRAVENOUS | Status: DC | PRN
Start: 2016-07-05 — End: 2016-07-08
  Administered 2016-07-05 – 2016-07-07 (×11): 2 mg via INTRAVENOUS
  Filled 2016-07-05 (×11): qty 1

## 2016-07-05 MED ORDER — OXYCODONE HCL 5 MG PO TABS
5.0000 mg | ORAL_TABLET | ORAL | Status: DC | PRN
  Administered 2016-07-06 (×3): 5 mg via ORAL
  Filled 2016-07-05 (×4): qty 1

## 2016-07-05 NOTE — Progress Notes (Signed)
ANTICOAGULATION CONSULT NOTE - Follow Up Consult  Pharmacy Consult for warfarin Indication: AF  No Known Allergies  Patient Measurements: Height: 5\' 8"  (172.7 cm) Weight: 177 lb 14.6 oz (80.7 kg) IBW/kg (Calculated) : 68.4 Heparin Dosing Weight: 80 kg  Vital Signs: Temp: 98.5 F (36.9 C) (05/06 0524) Temp Source: Oral (05/06 0524) BP: 97/51 (05/06 0524) Pulse Rate: 103 (05/06 0524)  Labs:  Recent Labs  07/03/16 0519 07/03/16 1438 07/04/16 0505 07/04/16 1053 07/04/16 2254 07/05/16 0537 07/05/16 0538  HGB 7.8* 8.3* 7.9*  --   --   --   --   HCT 23.3* 25.4* 24.0*  --   --   --   --   PLT 227 241 255  --   --   --   --   APTT  --   --   --  43*  --  109*  --   LABPROT 14.6  --  15.7*  --   --   --  18.5*  INR 1.13  --  1.24  --   --   --  1.52  HEPARINUNFRC 0.34  --   --   --  0.10*  --   --   CREATININE  --   --  7.83*  --   --   --   --     Estimated Creatinine Clearance: 9.5 mL/min (A) (by C-G formula based on SCr of 7.83 mg/dL (H)).   Medical History: Past Medical History:  Diagnosis Date  . Anginal pain (HCC)   . Coronary artery disease   . Diabetes mellitus without complication (HCC)   . Dialysis patient (HCC)    Tues, Thurs, Sat  . Dyspnea    with exertion  . Elevated lipids   . Enlarged prostate   . ESRD (end stage renal disease) (HCC)    dialysis M-W-F  . Hypertension   . Myocardial infarction (HCC) 08/2015  . Stroke [facility](HCC)    Lt side this year July    Medications:  Infusions:  . heparin 1,500 Units/hr (07/05/16 0435)    Assessment: 61 yom cc necrotic toes, followed by nephrology for ESRD HD, vascular surgery for angiogram (5/4), and hospitalist. Admitted with supratherapeutic INR (3.5), bridged with UFH for procedure. INR reversed with Vitamin K 2 mg subcutaneously on 4/26. Pharmacy consulted to resume VKA post angio. Patient also started on heparin drip 5/5.  Per PTA med list, patient takes warfarin 5 mg daily but is supposed to begin  taking 2.5 mg on Monday and Friday.  Date INR Dose 5/2 1.05 5 mg 5/3 1.14 HELD 5/4 1.13 HELD 5/5 1.24 5 mg  5/6       1.52   Goal of Therapy:  INR 2-3 Monitor platelets by anticoagulation protocol: Yes   Plan:  Will give another warfarin 5 mg x 1 will follow PT/INR and dose appropriately   Demetrius Charityeldrin D. Napolean Sia, PharmD  07/05/2016 8:46 AM

## 2016-07-05 NOTE — Progress Notes (Signed)
[facility]  ROUNDING NOTE   Subjective:   History taken with assistance of Spanish interpeter. Hemodialysis treatment yesterday. Tolerated treatment well. UF of 1.5  Groin pain continues.  Out of bed to chair.    Objective:  Vital signs in last 24 hours:  Temp:  [98.3 F (36.8 C)-98.9 F (37.2 C)] 98.5 F (36.9 C) (05/06 0524) Pulse Rate:  [97-116] 103 (05/06 0524) Resp:  [9-34] 20 (05/06 0524) BP: (97-143)/(51-76) 97/51 (05/06 0524) SpO2:  [97 %-100 %] 97 % (05/06 0524) Weight:  [80.7 kg (177 lb 14.6 oz)] 80.7 kg (177 lb 14.6 oz) (05/05 1050)  Weight change: 0.7 kg (1 lb 8.7 oz) Filed Weights   07/02/16 1330 07/03/16 0715 07/04/16 1050  Weight: 79.3 kg (174 lb 13.2 oz) 80 kg (176 lb 5.9 oz) 80.7 kg (177 lb 14.6 oz)    Intake/Output: I/O last 3 completed shifts: In: 512 [P.O.:420; IV Piggyback:92] Out: 2200 [Urine:700; Other:1500]   Intake/Output this shift:  Total I/O In: 240 [P.O.:240] Out: -   Physical Exam: General: No acute distress  Head: Normocephalic, atraumatic. Moist oral mucosal membranes  Eyes: Anicteric  Neck: Supple, trachea midline  Lungs:  Clear to auscultation, normal effort  Heart: S1S2 no rubs  Abdomen:  Soft, nontender, BS present  Extremities: No peripheral edema. Gangrene left 1st, 2nd and 3rd toe, right 2nd toe  Neurologic: Nonfocal, moving all four extremities  Skin: No lesions  Access: LUE AVF    Basic Metabolic Panel:  Recent Labs Lab 06/29/16 1616 07/01/16 0440 07/04/16 0505  NA 136 134* 132*  K 3.9 4.4 5.3*  CL 98* 97* 100*  CO2 29 28 24   GLUCOSE 158* 73 154*  BUN 14 28* 33*  CREATININE 3.69* 7.51* 7.83*  CALCIUM 8.2* 8.1* 7.7*  PHOS 2.8  --   --     Liver Function Tests:  Recent Labs Lab 06/29/16 1616  ALBUMIN 2.7*   No results for input(s): LIPASE, AMYLASE in the last 168 hours. No results for input(s): AMMONIA in the last 168 hours.  CBC:  Recent Labs Lab 07/02/16 0446 07/02/16 1759  07/03/16 0519 07/03/16 1438 07/04/16 0505  WBC 7.0 8.1 8.1 8.7 9.2  NEUTROABS  --  6.8*  --  5.9  --   HGB 8.1* 8.6* 7.8* 8.3* 7.9*  HCT 24.0* 26.3* 23.3* 25.4* 24.0*  MCV 89.0 89.3 88.1 88.6 87.7  PLT 216 240 227 241 255    Cardiac Enzymes: No results for input(s): CKTOTAL, CKMB, CKMBINDEX, TROPONINI in the last 168 hours.  BNP: Invalid input(s): POCBNP  CBG:  Recent Labs Lab 07/03/16 2112 07/04/16 0741 07/04/16 1648 07/04/16 2106 07/05/16 0805  GLUCAP 141* 155* 160* 163* 164*    Microbiology: Results for orders placed or performed during the hospital encounter of 06/24/16  MRSA PCR Screening     Status: None   Collection Time: 06/24/16  5:05 AM  Result Value Ref Range Status   MRSA by PCR NEGATIVE NEGATIVE Final    Comment:        The GeneXpert MRSA Assay (FDA approved for NASAL specimens only), is one component of a comprehensive MRSA colonization surveillance program. It is not intended to diagnose MRSA infection nor to guide or monitor treatment for MRSA infections.   CULTURE, BLOOD (ROUTINE X 2) w Reflex to ID Panel     Status: None   Collection Time: 06/27/16  7:22 AM  Result Value Ref Range Status   Specimen Description BLOOD RIGHT HAND  Final   Special Requests   Final    BOTTLES DRAWN AEROBIC AND ANAEROBIC Blood Culture adequate volume   Culture NO GROWTH 5 DAYS  Final   Report Status 07/02/2016 FINAL  Final  CULTURE, BLOOD (ROUTINE X 2) w Reflex to ID Panel     Status: None   Collection Time: 06/27/16  7:29 AM  Result Value Ref Range Status   Specimen Description BLOOD RIGHT ASSIST CONTROL  Final   Special Requests   Final    BOTTLES DRAWN AEROBIC AND ANAEROBIC Blood Culture adequate volume   Culture NO GROWTH 5 DAYS  Final   Report Status 07/02/2016 FINAL  Final    Coagulation Studies:  Recent Labs  07/03/16 0519 07/04/16 0505 07/05/16 0538  LABPROT 14.6 15.7* 18.5*  INR 1.13 1.24 1.52    Urinalysis: No results for input(s):  COLORURINE, LABSPEC, PHURINE, GLUCOSEU, HGBUR, BILIRUBINUR, KETONESUR, PROTEINUR, UROBILINOGEN, NITRITE, LEUKOCYTESUR in the last 72 hours.  Invalid input(s): APPERANCEUR    Imaging: No results found.   Medications:   . heparin 1,500 Units/hr (07/05/16 0435)   . amoxicillin-clavulanate  1 tablet Oral Q24H  . aspirin EC  81 mg Oral Daily  . atorvastatin  80 mg Oral QPM  . clopidogrel  75 mg Oral Daily  . [START ON 07/07/2016] epoetin (EPOGEN/PROCRIT) injection  10,000 Units Intravenous Q T,Th,Sa-HD  . ferric citrate  420 mg Oral TID WC  . insulin aspart  0-5 Units Subcutaneous QHS  . insulin aspart  0-9 Units Subcutaneous TID WC  . lisinopril  2.5 mg Oral Daily  . living well with diabetes book   Does not apply Once  . Melatonin  5 mg Oral QHS  . pneumococcal 23 valent vaccine  0.5 mL Intramuscular Tomorrow-1000  . senna  1 tablet Oral Daily  . tamsulosin  0.4 mg Oral QHS  . warfarin  5 mg Oral ONCE-1800  . Warfarin - Pharmacist Dosing Inpatient   Does not apply q1800   acetaminophen **OR** acetaminophen, benzonatate, bisacodyl, heparin, ipratropium-albuterol, lidocaine (PF), lidocaine-prilocaine, morphine injection, nitroGLYCERIN, ondansetron **OR** ondansetron (ZOFRAN) IV, ondansetron (ZOFRAN) IV, oxyCODONE, oxyCODONE, pentafluoroprop-tetrafluoroeth, phenol, phenol-menthol, polyethylene glycol  Assessment/ Plan:  61 y.o. Hispanic male ESRD on HD TTS, coronary artery disease, diabetes mellitus type 2, hyperlipidemia, hypertension, history of CVA, anemia chronic kidney disease, secondary hyperparathyroidism who presents with gangrenous changes on toes of both feet.  [facility] TTS [address] Left AVF   1.  ESRD on HD TTS: with hyperkalemia. 2 K bath yesterday.  Continue TTS schedule.   Dialysis in chair on next treatment  2.  Anemia chronic kidney disease:  Hemoglobin 7.9 - Epogen with dialysis. Increase to 10000 units  3.  Secondary hyperparathyroidism.  Phosphorus at  goal - Auryxia with meals.   4.  Hypertension: blood pressure at goal.  - Continue amlodipine,carvedilol, and lisinopril.  5.   Peripheral arterial disease:  Status post right lower extremity endarterectomies on 5/4 Dr. Gilda Crease.  Discussed care with daughter over the phone.  Discussed care with Dr. Amado Coe and Dr. Evie Lacks   LOS: [address] [facility] 5/6/201810:12 AM

## 2016-07-05 NOTE — Progress Notes (Addendum)
ANTICOAGULATION CONSULT NOTE - Initial Consult  Pharmacy Consult for heparin drip Indication: atrial fibrillation  No Known Allergies  Patient Measurements: Height: 5\' 8"  (172.7 cm) Weight: 177 lb 14.6 oz (80.7 kg) IBW/kg (Calculated) : 68.4 Heparin Dosing Weight: 80 kg  Vital Signs: Temp: 98.3 F (36.8 C) (05/05 2033) Temp Source: Oral (05/05 2033) BP: 107/59 (05/05 2033) Pulse Rate: 105 (05/05 2033)  Labs:  Recent Labs  07/02/16 0446  07/03/16 0519 07/03/16 1438 07/04/16 0505 07/04/16 1053 07/04/16 2254  HGB 8.1*  < > 7.8* 8.3* 7.9*  --   --   HCT 24.0*  < > 23.3* 25.4* 24.0*  --   --   PLT 216  < > 227 241 255  --   --   APTT  --   --   --   --   --  43*  --   LABPROT 14.7  --  14.6  --  15.7*  --   --   INR 1.14  --  1.13  --  1.24  --   --   HEPARINUNFRC 0.34  --  0.34  --   --   --  0.10*  CREATININE  --   --   --   --  7.83*  --   --   < > = values in this interval not displayed.  Estimated Creatinine Clearance: 9.5 mL/min (A) (by C-G formula based on SCr of 7.83 mg/dL (H)).   Medical History: Past Medical History:  Diagnosis Date  . Anginal pain (HCC)   . Coronary artery disease   . Diabetes mellitus without complication (HCC)   . Dialysis patient (HCC)    Tues, Thurs, Sat  . Dyspnea    with exertion  . Elevated lipids   . Enlarged prostate   . ESRD (end stage renal disease) (HCC)    dialysis M-W-F  . Hypertension   . Myocardial infarction (HCC) 08/2015  . Stroke [facility])    Lt side this year July    Medications:  Scheduled:  . amoxicillin-clavulanate  1 tablet Oral Q24H  . aspirin EC  81 mg Oral Daily  . atorvastatin  80 mg Oral QPM  . clopidogrel  75 mg Oral Daily  . epoetin (EPOGEN/PROCRIT) injection  4,000 Units Intravenous Q T,Th,Sa-HD  . ferric citrate  420 mg Oral TID WC  . heparin  2,400 Units Intravenous Once  . insulin aspart  0-5 Units Subcutaneous QHS  . insulin aspart  0-9 Units Subcutaneous TID WC  . lisinopril  2.5 mg Oral  Daily  . living well with diabetes book   Does not apply Once  . Melatonin  5 mg Oral QHS  . pneumococcal 23 valent vaccine  0.5 mL Intramuscular Tomorrow-1000  . senna  1 tablet Oral Daily  . tamsulosin  0.4 mg Oral QHS  . Warfarin - Pharmacist Dosing Inpatient   Does not apply q1800   Infusions:  . heparin 1,200 Units/hr (07/04/16 1457)    Assessment: Pharmacy consulted to dose heparin in this 88 yoF ESRD on HD with afib. Patient was receiving heparin until surgery on 5/4, heparin is now being restarted (~18 hours post-op).  Goal of Therapy:  Heparin level 0.3-0.7 units/ml Monitor platelets by anticoagulation protocol: Yes   Plan:  Give 0 units bolus x 1 Start heparin infusion at 1200 units/hr Check anti-Xa level in 8 hours and daily while on heparin Continue to monitor H&H and platelets   5/5 23:00 heparin level  0.10. 2400 unit bolus and increase rate to 1500 units/hr. Recheck in 8 hours   Fulton ReekMatt Shemeka Wardle, PharmD, BCPS  07/05/16 1:33 AM

## 2016-07-05 NOTE — Progress Notes (Signed)
[employer] - [place] [facility]   PATIENT NAME: Eddie Myers    MR#:  [id number]  DATE OF BIRTH:  [birth year]  SUBJECTIVE:  CHIEF COMPLAINT:  No chief complaint on file. PatientHad a right-sided endarterectomy 5/4.Reporting right groin pain REVIEW OF SYSTEMS:    Review of Systems  Constitutional: Positive for malaise/fatigue. Negative for chills and fever.  HENT: Negative for sore throat.   Eyes: Negative for blurred vision, double vision and pain.  Respiratory: Negative for cough, hemoptysis, shortness of breath and wheezing.   Cardiovascular: Negative for chest pain, palpitations, orthopnea and leg swelling.  Gastrointestinal: Negative for abdominal pain, constipation, diarrhea, heartburn, nausea and vomiting.  Genitourinary: Negative for dysuria and hematuria.  Musculoskeletal: Negative for back pain and joint pain.  Skin: Negative for rash.  Neurological: Positive for weakness. Negative for sensory change, speech change, focal weakness and headaches.  Endo/Heme/Allergies: Does not bruise/bleed easily.  Psychiatric/Behavioral: Negative for depression. The patient is not nervous/anxious.    DRUG ALLERGIES:  No Known Allergies  VITALS:  Blood pressure (!) 97/51, pulse (!) 103, temperature 98.5 F (36.9 C), temperature source Oral, resp. rate 20, height 5\' 8"  (1.727 m), weight 80.7 kg (177 lb 14.6 oz), SpO2 97 %.  PHYSICAL EXAMINATION:   Physical Exam  GENERAL:  61 y.o.-year-old patient lying in the bed with no acute distress.  EYES: Pupils equal, round, reactive to light and accommodation. No scleral icterus. Extraocular muscles intact.  HEENT: Head atraumatic, normocephalic. Oropharynx and nasopharynx clear.  NECK:  Supple, no jugular venous distention. No thyroid enlargement, no tenderness.  LUNGS: Normal breath sounds bilaterally, no wheezing, rales, rhonchi. No use of accessory muscles of respiration.  CARDIOVASCULAR: S1, S2 normal. No murmurs, rubs,  or gallops.  ABDOMEN: Soft, nontender, nondistended. Bowel sounds present. No organomegaly or mass. Right groin area with clean incision, tender to touch EXTREMITIES: Toes bilaterally are discolored with dry gangrene of the right second toe and left foot great toe NEUROLOGIC: Cranial nerves II through XII are intact. No focal Motor or sensory deficits b/l.   PSYCHIATRIC: The patient is alert and oriented x 3.  SKIN: No obvious rash, lesion, or ulcer.  LABORATORY PANEL:   CBC  Recent Labs Lab 07/04/16 0505  WBC 9.2  HGB 7.9*  HCT 24.0*  PLT 255   ------------------------------------------------------------------------------------------------------------------ Chemistries   Recent Labs Lab 07/04/16 0505  NA 132*  K 5.3*  CL 100*  CO2 24  GLUCOSE 154*  BUN 33*  CREATININE 7.83*  CALCIUM 7.7*   ------------------------------------------------------------------------------------------------------------------  Cardiac Enzymes No results for input(s): TROPONINI in the last 168 hours. ------------------------------------------------------------------------------------------------------------------  RADIOLOGY:  No results found.   ASSESSMENT AND PLAN:  Eddie Myers  is a 61 y.o. male with a known history of End-stage renal disease on hemodialysis, diabetes, hypertension, hyperlipidemia, history of previous CVA, history of coronary artery disease status post bypass who presented to the hospital from his cardiologist's office due to bilateral lower extremity necrotic toes redness and pain.  * Bilateral lower extremity cyanosis with necrotic toes --Cardiology has cleared the patient for surgery. Discussed with Dr. Lewie Loron, patient's recent nuclear stress test was normal  s/p Right lower extremity angiogram -  rt common femoral endarterectomy 07/04/16 with distal angioplasty .07/03/16 -On aspirin 81 mg, Plavix 75 mg and heparin drip while waiting for podiatry consult,Discussed with Dr.  Orland Jarred yesterday, not considering any surgeries at this time. Coumadin resumed. INR at 1.5 today, continue heparin drip in the interim. Discussed with vascular surgery  okay to continue aspirin 81 mg, Plavix 75 and Coumadin  * Fever likely due to gangrenous toes -Afebrile  - PO Augmentin to finish 7 days course.  Pain control with IV and oral pain medications Status post left lower extremity angiogram with angioplasty of left superficial femoral, popliteal and peroneal arteries.  * Subacute right sided vision loss-patient had symptoms about a month ago.  -CT head shows a age-indeterminate right sided thalamic CVA Appreciate Neurology input MRI shows old CVA  * Paroxysmal atrial fibrillation on Coumadin.  Coumadin held for angiogram, resumed now  . Will follow-up with vascular surgery and podiatry  * End-stage renal disease on hemodialysis-patient gets dialysis on Tuesday Thursday Saturday Getting dialysis per nephro  * Hypotension with h/o hypertension-continue lisinopril, Hold carvedilol, Norvasc.  * Hypoglycemia with h/o Diabetes type 2 without complication-stop lantus, continue SSI,   * Hyperlipidemia-continue atorvastatin  * Throat  Pain-resolved - Tessalon Perles as needed  -PT is recommending skilled nursing facility. Follow up with case management  Follow-up with vascular surgery, podiatry, nephrology and cardiology [facility]   All the records are reviewed and case discussed with Care Management/Social Worker. Management plans discussed with the patient with the help of Spanish interpreter, nursing and they are in agreement.  CODE STATUS: FULL CODE  DVT Prophylaxis: SCDs  TOTAL TIME TAKING CARE OF THIS PATIENT: 35 minutes.   POSSIBLE D/C IN 2 DAYS, DEPENDING ON CLINICAL CONDITION.  Ramonita LabGouru, [name] M.D on 07/05/2016 at 1:44 PM  Between 7am to 6pm - Pager - [phone]  After 6pm go to [email or web address] - password [email or web address]  [facility]  Office   [phone]  CC: Primary care physician; System, Pcp Not In  Note: This dictation was prepared with Dragon dictation along with smaller phrase technology. Any transcriptional errors that result from this process are unintentional.

## 2016-07-05 NOTE — Progress Notes (Signed)
2 Days Post-Op   Subjective/Chief Complaint: Doing Ok. Still has moderate pain at groin incision and foot. Controlled with current regimen. Tolerated being in chair yesterday. Tol HD.   Objective: Vital signs in last 24 hours: Temp:  [98.3 F (36.8 C)-98.9 F (37.2 C)] 98.5 F (36.9 C) (05/06 0524) Pulse Rate:  [97-116] 103 (05/06 0524) Resp:  [9-34] 20 (05/06 0524) BP: (97-143)/(51-76) 97/51 (05/06 0524) SpO2:  [97 %-100 %] 97 % (05/06 0524) Weight:  [80.7 kg (177 lb 14.6 oz)] 80.7 kg (177 lb 14.6 oz) (05/05 1050) Last BM Date: 07/02/16  Intake/Output from previous day: 05/05 0701 - 05/06 0700 In: 410 [P.O.:360; IV Piggyback:50] Out: 1800 [Urine:300] Intake/Output this shift: No intake/output data recorded.  General appearance: alert and no distress Extremities: warm, incision C/D/I, decreased erythema over dorsum of right foot, no edema, stable dry gangrene of toes Pulses: +PT  Lab Results:   Recent Labs  07/03/16 1438 07/04/16 0505  WBC 8.7 9.2  HGB 8.3* 7.9*  HCT 25.4* 24.0*  PLT 241 255   BMET  Recent Labs  07/04/16 0505  NA 132*  K 5.3*  CL 100*  CO2 24  GLUCOSE 154*  BUN 33*  CREATININE 7.83*  CALCIUM 7.7*   PT/INR  Recent Labs  07/04/16 0505 07/05/16 0538  LABPROT 15.7* 18.5*  INR 1.24 1.52   ABG No results for input(s): PHART, HCO3 in the last 72 hours.  Invalid input(s): PCO2, PO2  Studies/Results: No results found.  Anti-infectives: Anti-infectives    Start     Dose/Rate Route Frequency Ordered Stop   07/03/16 1546  ceFAZolin (ANCEF) IVPB 1 g/50 mL premix     1 g 100 mL/hr over 30 Minutes Intravenous Every 12 hours 07/03/16 1536 07/04/16 1014   07/03/16 0600  ceFAZolin (ANCEF) IVPB 1 g/50 mL premix    Comments:  Send with pt to OR   1 g 100 mL/hr over 30 Minutes Intravenous On call 07/02/16 1250 07/03/16 0814   07/01/16 0500  ceFAZolin (ANCEF) IVPB 1 g/50 mL premix    Comments:  Send with pt to OR   1 g 100 mL/hr over 30  Minutes Intravenous On call 06/30/16 1828 07/02/16 0500   06/30/16 1200  amoxicillin-clavulanate (AUGMENTIN) 500-125 MG per tablet 500 mg     1 tablet Oral Every 24 hours 06/30/16 0951     06/30/16 1000  amoxicillin-clavulanate (AUGMENTIN) 500-125 MG per tablet 500 mg  Status:  Discontinued     1 tablet Oral Every 12 hours 06/30/16 0726 06/30/16 0951   06/28/16 1000  piperacillin-tazobactam (ZOSYN) IVPB 3.375 g  Status:  Discontinued     3.375 g 12.5 mL/hr over 240 Minutes Intravenous Every 12 hours 06/28/16 0736 06/30/16 0726   06/27/16 1200  vancomycin (VANCOCIN) IVPB 750 mg/150 ml premix  Status:  Discontinued     750 mg 150 mL/hr over 60 Minutes Intravenous Every T-Th-Sa (Hemodialysis) 06/27/16 1122 06/27/16 1127   06/27/16 1200  vancomycin (VANCOCIN) 750 mg in sodium chloride 0.9 % 150 mL IVPB  Status:  Discontinued     750 mg 150 mL/hr over 60 Minutes Intravenous Every T-Th-Sa (Hemodialysis) 06/27/16 1127 06/30/16 0726   06/27/16 1100  vancomycin (VANCOCIN) 1,750 mg in sodium chloride 0.9 % 500 mL IVPB     1,750 mg 250 mL/hr over 120 Minutes Intravenous  Once 06/27/16 1055 06/27/16 1411   06/26/16 0730  ceFAZolin (ANCEF) IVPB 1 g/50 mL premix     1 g 100  mL/hr over 30 Minutes Intravenous  Once 06/26/16 1610 06/26/16 0842      Assessment/Plan: s/p Procedure(s): Peripheral Vascular Balloon Angioplasty (N/A) Lower Extremity Angiography Lower Extremity Intervention OOB with PT  Coumadin daily  LOS: 11 days    Eli Hose A 07/05/2016

## 2016-07-05 NOTE — Progress Notes (Signed)
Per Dr. Rob HickmanGouru's request, I notified Dr. Orland Jarredroxler to please see pt. Dr. Orland Jarredroxler acknowledged, stated he would see pt.

## 2016-07-05 NOTE — Progress Notes (Signed)
Physical Therapy Evaluation Patient Details Name: Eddie DraftsMauro Myers MRN: [id number] DOB: [birth year] Today's Date: 07/05/2016   History of Present Illness  61 y.o. Hispanic male ESRD on HD TTS, coronary artery disease, diabetes mellitus type 2, hyperlipidemia, hypertension, history of CVA, anemia chronic kidney disease, secondary hyperparathyroidism who presents with gangrenous changes on toes of both feet.Status post left lower extremity angiogram with angioplasty of left superficial femoral, popliteal and peroneal arteries  Right common femoral, profunda femoris, and superficial femoral artery endarterectomies and patch angioplasty separately to the PFA alone and the CFA/SFA combined.  Clinical Impression  Patient is in recliner when approached for PT evaluation. He agrees to attempt transfers and walking. He transfers with min assist with RW sit to stand and cues for hand placement and safety. Patient ambulates 5 feet with RW and moaning with LE movement. He is able to ambulate forward and backwards slowly with antalgic gait. He is able to stand independently with UE support with RW for several minutes. He has decreased BLE strength hip flex and abd 2+/5 and knee flex / ext 3/5 BLE with pain during all movements. Patient will benefit from skilled PT to improve mobility and gait.     Follow Up Recommendations SNF    Equipment Recommendations  Rolling walker with 5" wheels    Recommendations for Other Services       Precautions / Restrictions Restrictions Weight Bearing Restrictions: Yes      Mobility  Bed Mobility Overal bed mobility:  (Patient was in recliner when apporached for PT)                Transfers Overall transfer level: Needs assistance Equipment used: Rolling walker (2 wheeled) Transfers: Sit to/from Stand Sit to Stand: Min assist            Ambulation/Gait Ambulation/Gait assistance: Modified independent (Device/Increase time) Ambulation Distance (Feet): 5  Feet Assistive device: Rolling walker (2 wheeled) Gait Pattern/deviations: Step-to pattern   Gait velocity interpretation: <1.8 ft/sec, indicative of risk for recurrent falls General Gait Details:  (slow, antalgic)  Stairs            Wheelchair Mobility    Modified Rankin (Stroke Patients Only)       Balance Overall balance assessment: No apparent balance deficits (not formally assessed)                                           Pertinent Vitals/Pain Pain Assessment: Faces Pain Score: 5  Faces Pain Scale: Hurts whole lot Pain Descriptors / Indicators: Moaning Pain Intervention(s): Limited activity within patient's tolerance    Home Living Family/patient expects to be discharged to:: Private residence Living Arrangements: Children                    Prior Function                 Hand Dominance        Extremity/Trunk Assessment   Upper Extremity Assessment Upper Extremity Assessment: Overall WFL for tasks assessed    Lower Extremity Assessment Lower Extremity Assessment: Overall WFL for tasks assessed    Cervical / Trunk Assessment Cervical / Trunk Assessment: Normal  Communication      Cognition Arousal/Alertness: Awake/alert Behavior During Therapy: WFL for tasks assessed/performed  General Comments      Exercises     Assessment/Plan    PT Assessment Patient needs continued PT services  PT Problem List Decreased strength;Decreased mobility;Pain;Decreased activity tolerance       PT Treatment Interventions Gait training;Therapeutic exercise;Therapeutic activities    PT Goals (Current goals can be found in the Care Plan section)  Acute Rehab PT Goals Patient Stated Goal: no goal stated PT Goal Formulation: With patient Time For Goal Achievement: 07/19/16 Potential to Achieve Goals: Good    Frequency Min 2X/week   Barriers to discharge   (unknown)      Co-evaluation               AM-PAC PT "6 Clicks" Daily Activity  Outcome Measure Difficulty turning over in bed (including adjusting bedclothes, sheets and blankets)?: Total Difficulty moving from lying on back to sitting on the side of the bed? : Total Difficulty sitting down on and standing up from a chair with arms (e.g., wheelchair, bedside commode, etc,.)?: Total Help needed moving to and from a bed to chair (including a wheelchair)?: Total Help needed walking in hospital room?: Total Help needed climbing 3-5 steps with a railing? : Total 6 Click Score: 6    End of Session Equipment Utilized During Treatment: Gait belt Activity Tolerance: Patient limited by pain;Patient limited by fatigue Patient left: in chair;with chair alarm set Nurse Communication: Mobility status PT Visit Diagnosis: Muscle weakness (generalized) (M62.81);Difficulty in walking, not elsewhere classified (R26.2);Pain Pain - Right/Left: Right Pain - part of body: Leg    Time: 1030-1110 PT Time Calculation (min) (ACUTE ONLY): 40 min   Charges:   PT Evaluation $PT Eval Low Complexity: 1 Procedure PT Treatments $Therapeutic Activity: 8-22 mins   PT G Codes:   PT G-Codes **NOT FOR INPATIENT CLASS** Functional Assessment Tool Used: Clinical judgement Functional Limitation: Mobility: Walking and moving around Mobility: Walking and Moving Around Current Status (Z6109): At least 40 percent but less than 60 percent impaired, limited or restricted Mobility: Walking and Moving Around Goal Status [phone]): At least 20 percent but less than 40 percent impaired, limited or restricted    Eddie Myers, PT, DPT  Eddie Myers S 07/05/2016, 12:46 PM

## 2016-07-05 NOTE — Progress Notes (Addendum)
ANTICOAGULATION CONSULT NOTE - Initial Consult  Pharmacy Consult for heparin drip Indication: atrial fibrillation  No Known Allergies  Patient Measurements: Height: 5\' 8"  (172.7 cm) Weight: 177 lb 14.6 oz (80.7 kg) IBW/kg (Calculated) : 68.4 Heparin Dosing Weight: 80 kg  Vital Signs: Temp: 98.1 F (36.7 C) (05/06 1337) Temp Source: Oral (05/06 1337) BP: 100/66 (05/06 1337) Pulse Rate: 101 (05/06 1337)  Labs:  Recent Labs  07/03/16 0519 07/03/16 1438 07/04/16 0505 07/04/16 1053 07/04/16 2254 07/05/16 0537 07/05/16 0538 07/05/16 1319  HGB 7.8* 8.3* 7.9*  --   --   --   --   --   HCT 23.3* 25.4* 24.0*  --   --   --   --   --   PLT 227 241 255  --   --   --   --   --   APTT  --   --   --  43*  --  109*  --   --   LABPROT 14.6  --  15.7*  --   --   --  18.5*  --   INR 1.13  --  1.24  --   --   --  1.52  --   HEPARINUNFRC 0.34  --   --   --  0.10*  --   --  0.16*  CREATININE  --   --  7.83*  --   --   --   --   --     Estimated Creatinine Clearance: 9.5 mL/min (A) (by C-G formula based on SCr of 7.83 mg/dL (H)).   Medical History: Past Medical History:  Diagnosis Date  . Anginal pain (HCC)   . Coronary artery disease   . Diabetes mellitus without complication (HCC)   . Dialysis patient (HCC)    Tues, Thurs, Sat  . Dyspnea    with exertion  . Elevated lipids   . Enlarged prostate   . ESRD (end stage renal disease) (HCC)    dialysis M-W-F  . Hypertension   . Myocardial infarction (HCC) 08/2015  . Stroke [facility])    Lt side this year July    Medications:  Scheduled:  . amoxicillin-clavulanate  1 tablet Oral Q24H  . aspirin EC  81 mg Oral Daily  . atorvastatin  80 mg Oral QPM  . clopidogrel  75 mg Oral Daily  . [START ON 07/07/2016] epoetin (EPOGEN/PROCRIT) injection  10,000 Units Intravenous Q T,Th,Sa-HD  . ferric citrate  420 mg Oral TID WC  . insulin aspart  0-5 Units Subcutaneous QHS  . insulin aspart  0-9 Units Subcutaneous TID WC  . lisinopril  2.5 mg  Oral Daily  . living well with diabetes book   Does not apply Once  . Melatonin  5 mg Oral QHS  . pneumococcal 23 valent vaccine  0.5 mL Intramuscular Tomorrow-1000  . senna  1 tablet Oral Daily  . tamsulosin  0.4 mg Oral QHS  . warfarin  5 mg Oral ONCE-1800  . Warfarin - Pharmacist Dosing Inpatient   Does not apply q1800   Infusions:  . heparin 1,500 Units/hr (07/05/16 1305)    Assessment: Pharmacy consulted to dose heparin in this 35 yoF ESRD on HD with afib. Patient was receiving heparin until surgery on 5/4, heparin is now being restarted (~18 hours post-op).  Goal of Therapy:  Heparin level 0.3-0.7 units/ml Monitor platelets by anticoagulation protocol: Yes   Plan:  Give 0 units bolus x 1 Start heparin infusion  at 1200 units/hr Check anti-Xa level in 8 hours and daily while on heparin Continue to monitor H&H and platelets   5/5 23:00 heparin level 0.10. 2400 unit bolus and increase rate to 1500 units/hr. Recheck in 8 hours  5/6: Heparin level resulted @ 0.16. Will give a bolus of 2400 units. Will also increase heparin gtt to 1800 units/hr. Will recheck Heparin level @ 0000.   5/6 00:00 heparin level 0.40 Continue current regimen. Recheck in 8 hours to confirm.   Demetrius Charityeldrin D. James, PharmD  07/05/16 3:08 PM

## 2016-07-05 NOTE — Progress Notes (Signed)
Patient Demographics  Eddie Myers, is a 61 y.o. male   MRN: [id number]   DOB - 10-06-1955  Admit Date - 06/24/2016    Outpatient Primary MD for the patient is System, Pcp Not In  Consult requested in the Hospital by Ramonita Lab, MD, On 07/05/2016   With History of -  Past Medical History:  Diagnosis Date  . Anginal pain (HCC)   . Coronary artery disease   . Diabetes mellitus without complication (HCC)   . Dialysis patient (HCC)    Tues, Thurs, Sat  . Dyspnea    with exertion  . Elevated lipids   . Enlarged prostate   . ESRD (end stage renal disease) (HCC)    dialysis M-W-F  . Hypertension   . Myocardial infarction (HCC) 08/2015  . Stroke [facility])    Lt side this year July      Past Surgical History:  Procedure Laterality Date  . ABDOMINAL AORTOGRAM W/LOWER EXTREMITY Left 06/26/2016   Procedure: Abdominal Aortogram w/Lower Extremity;  Surgeon: Renford Dills, MD;  Location: [facility] INVASIVE CV LAB;  Service: Cardiovascular;  Laterality: Left;  . AV FISTULA PLACEMENT Left 01/10/2016   Procedure: INSERTION OF ARTERIOVENOUS (AV) GORE-TEX GRAFT ARM ( BRACH / AXILLARY GRAFT );  Surgeon: Renford Dills, MD;  Location: [facility] ORS;  Service: Vascular;  Laterality: Left;  . CARDIAC CATHETERIZATION    . CORONARY ARTERY BYPASS GRAFT  01/2014   [facility] (LIMA -> LAD and SVG -> OM)  . INSERTION OF DIALYSIS CATHETER Right    Perma catheter  . PERIPHERAL VASCULAR CATHETERIZATION N/A 01/17/2016   Procedure: Thrombectomy;  Surgeon: Renford Dills, MD;  Location: [facility] INVASIVE CV LAB;  Service: Cardiovascular;  Laterality: N/A;  . PERIPHERAL VASCULAR CATHETERIZATION Left 01/28/2016   Procedure: A/V Fistulagram;  Surgeon: Renford Dills, MD;  Location: [facility] INVASIVE CV LAB;  Service: Cardiovascular;   Laterality: Left;  . PERIPHERAL VASCULAR CATHETERIZATION N/A 03/24/2016   Procedure: Dialysis/Perma Catheter Removal;  Surgeon: Renford Dills, MD;  Location: [facility] INVASIVE CV LAB;  Service: Cardiovascular;  Laterality: N/A;  . UPPER EXTREMITY ANGIOGRAM Left 01/28/2016   Procedure: Upper Extremity Angiogram;  Surgeon: Renford Dills, MD;  Location: [facility] INVASIVE CV LAB;  Service: Cardiovascular;  Laterality: Left;    in for   No chief complaint on file.    HPI  Eddie Myers  is a 61 y.o. male, Was admitted hospital about 2 weeks ago with gangrenous changes to both feet. Undergone several different procedures on each lower extremity to try to improve the circulation most recently on Friday. Patient states he is more comfortable with it now still has gangrenous changes to the toes on both feet. He's been followed by Dr. Gwyneth Revels and Dr. Linus Galas during his hospital stay.    Review of Systems    In addition to the HPI above,  No Fever-chills, No Headache, No changes with Vision or hearing, No problems swallowing food or Liquids, No Chest pain,  Cough or Shortness of Breath, No Abdominal pain, No Nausea or Vommitting, Bowel movements are regular, No Blood in stool or Urine, No dysuria, No new skin rashes or bruises, No new joints pains-aches,  No new weakness, tingling, numbness in any extremity, No recent weight gain or loss, No polyuria, polydypsia or polyphagia, No significant Mental Stressors.  A full 10 point Review of Systems was done, except as stated above, all other Review of Systems were negative.   Social History Social History  Substance Use Topics  . Smoking status: Former Smoker    Packs/day: 0.50    Years: 45.00    Quit date: 01/05/2015  . Smokeless tobacco: Never Used  . Alcohol use No   Family History Family History  Problem Relation Age of Onset  . Cancer Mother   . Diabetes Mother   . Diabetes Father      Prior to Admission medications    Medication Sig Start Date End Date Taking? Authorizing Provider  amLODipine (NORVASC) 2.5 MG tablet Take 2.5 mg by mouth daily.   Yes [provider]  atorvastatin (LIPITOR) 80 MG tablet Take 80 mg by mouth every evening.  02/07/16  Yes [provider]  carvedilol (COREG) 25 MG tablet Take 1 tablet (25 mg total) by mouth 2 (two) times daily. Please contact the patient's cardiologist to see if this medication should be continued 03/24/16 06/24/16 Yes Schnier, Latina Craver, MD  ferric citrate (AURYXIA) 1 GM 210 MG(Fe) tablet Take 420 mg by mouth 3 (three) times daily with meals.   Yes [provider]  glipiZIDE (GLUCOTROL) 5 MG tablet Take 5 mg by mouth 2 (two) times daily.    Yes [provider]  insulin glargine (LANTUS) 100 UNIT/ML injection Inject 25 Units into the skin at bedtime.    Yes [provider]  insulin regular (NOVOLIN R,HUMULIN R) 100 units/mL injection Inject 0-10 Units into the skin 3 (three) times daily before meals.    Yes [provider]  lisinopril (PRINIVIL,ZESTRIL) 2.5 MG tablet Take 2.5 mg by mouth daily.   Yes [provider]  Melatonin 3 MG TABS Take 3 mg by mouth at bedtime.    Yes [provider]  nitroGLYCERIN (NITROSTAT) 0.4 MG SL tablet Place 0.4 mg under the tongue every 5 (five) minutes as needed for chest pain.   Yes [provider]  tamsulosin (FLOMAX) 0.4 MG CAPS capsule Take 0.4 mg by mouth at bedtime.   Yes [provider]  warfarin (COUMADIN) 5 MG tablet Take as directed by [facility] Patient taking differently: Take 5 mg by mouth daily at 6 PM.  04/08/16  Yes End, Cristal Deer, MD    Anti-infectives    Start     Dose/Rate Route Frequency Ordered Stop   07/03/16 1546  ceFAZolin (ANCEF) IVPB 1 g/50 mL premix     1 g 100 mL/hr over 30 Minutes Intravenous Every 12 hours 07/03/16 1536 07/04/16 1014   07/03/16 0600  ceFAZolin (ANCEF) IVPB 1 g/50 mL premix    Comments:  Send  with pt to OR   1 g 100 mL/hr over 30 Minutes Intravenous On call 07/02/16 1250 07/03/16 0814   07/01/16 0500  ceFAZolin (ANCEF) IVPB 1 g/50 mL premix    Comments:  Send with pt to OR   1 g 100 mL/hr over 30 Minutes Intravenous On call 06/30/16 1828 07/02/16 0500   06/30/16 1200  amoxicillin-clavulanate (AUGMENTIN) 500-125 MG per tablet 500 mg  1 tablet Oral Every 24 hours 06/30/16 0951     06/30/16 1000  amoxicillin-clavulanate (AUGMENTIN) 500-125 MG per tablet 500 mg  Status:  Discontinued     1 tablet Oral Every 12 hours 06/30/16 0726 06/30/16 0951   06/28/16 1000  piperacillin-tazobactam (ZOSYN) IVPB 3.375 g  Status:  Discontinued     3.375 g 12.5 mL/hr over 240 Minutes Intravenous Every 12 hours 06/28/16 0736 06/30/16 0726   06/27/16 1200  vancomycin (VANCOCIN) IVPB 750 mg/150 ml premix  Status:  Discontinued     750 mg 150 mL/hr over 60 Minutes Intravenous Every T-Th-Sa (Hemodialysis) 06/27/16 1122 06/27/16 1127   06/27/16 1200  vancomycin (VANCOCIN) 750 mg in sodium chloride 0.9 % 150 mL IVPB  Status:  Discontinued     750 mg 150 mL/hr over 60 Minutes Intravenous Every T-Th-Sa (Hemodialysis) 06/27/16 1127 06/30/16 0726   06/27/16 1100  vancomycin (VANCOCIN) 1,750 mg in sodium chloride 0.9 % 500 mL IVPB     1,750 mg 250 mL/hr over 120 Minutes Intravenous  Once 06/27/16 1055 06/27/16 1411   06/26/16 0730  ceFAZolin (ANCEF) IVPB 1 g/50 mL premix     1 g 100 mL/hr over 30 Minutes Intravenous  Once 06/26/16 0722 06/26/16 0842      Scheduled Meds: . amoxicillin-clavulanate  1 tablet Oral Q24H  . aspirin EC  81 mg Oral Daily  . atorvastatin  80 mg Oral QPM  . clopidogrel  75 mg Oral Daily  . [START ON 07/07/2016] epoetin (EPOGEN/PROCRIT) injection  10,000 Units Intravenous Q T,Th,Sa-HD  . ferric citrate  420 mg Oral TID WC  . insulin aspart  0-5 Units Subcutaneous QHS  . insulin aspart  0-9 Units Subcutaneous TID WC  . lisinopril  2.5 mg Oral Daily  . living well with diabetes  book   Does not apply Once  . Melatonin  5 mg Oral QHS  . pneumococcal 23 valent vaccine  0.5 mL Intramuscular Tomorrow-1000  . senna  1 tablet Oral Daily  . tamsulosin  0.4 mg Oral QHS  . warfarin  5 mg Oral ONCE-1800  . Warfarin - Pharmacist Dosing Inpatient   Does not apply q1800   Continuous Infusions: . heparin 1,500 Units/hr (07/05/16 1305)  No Known Allergies  Physical Exam  Vitals  Blood pressure (!) 97/51, pulse (!) 103, temperature 98.5 F (36.9 C), temperature source Oral, resp. rate 20, height 5\' 8"  (1.727 m), weight 80.7 kg (177 lb 14.6 oz), SpO2 97 %.  Lower Extremity exam  Vascular: Nonpalpable bilaterally. Patient had reconstructive procedures to both lower extremities. Maybe has some improvements this first timeout seems is difficult for me to make a comparison to what he was doing before but he does state that he has less pain in both feet now.  Dermatological: Gangrenous changes to the left hallux and the right second toe. There is some necrotic tissue on the dorsum of the second third toes of the left foot but overall the color almost toes is better than it is to the great toe which is significantly gangrenous. Both right and left feet are dry with no evidence of infection or purulence or progressing or localized cellulitis to the region. No undue swelling or redness. Currently the plantar aspect of the foot appears to be viable up to the toes. Right now is difficult to say if the second third toes on the left foot are going to remain viable or if they to gradually become necrotic.  Data Review  CBC  Recent Labs Lab 07/02/16 0446 07/02/16 1759 07/03/16 0519 07/03/16 1438 07/04/16 0505  WBC 7.0 8.1 8.1 8.7 9.2  HGB 8.1* 8.6* 7.8* 8.3* 7.9*  HCT 24.0* 26.3* 23.3* 25.4* 24.0*  PLT 216 240 227 241 255  MCV 89.0 89.3 88.1 88.6 87.7  MCH 30.0 29.2 29.5 29.1 28.9  MCHC 33.7 32.7 33.5 32.8 33.0  RDW 14.9* 15.0* 15.1* 15.2* 15.5*  LYMPHSABS  --  0.5*  --  1.5   --   MONOABS  --  0.6  --  1.0  --   EOSABS  --  0.1  --  0.3  --   BASOSABS  --  0.0  --  0.1  --    ------------------------------------------------------------------------------------------------------------------  Chemistries   Recent Labs Lab 06/29/16 1616 07/01/16 0440 07/04/16 0505  NA 136 134* 132*  K 3.9 4.4 5.3*  CL 98* 97* 100*  CO2 29 28 24   GLUCOSE 158* 73 154*  BUN 14 28* 33*  CREATININE 3.69* 7.51* 7.83*  CALCIUM 8.2* 8.1* 7.7*   -------------------------------------------------------------------------------------------------------------  Assessment & Plan: 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 Questionable as to whether or not the second third toe on the left will be viable with a 2 gradually turn necrotic. No signs of infection or cellulitis is noted to the region as this timeframe. Plan: My suggestion this point would be to give the other toes and other portions of his feet time to establish decent flow before amputating the left great toe and the right second toe. Hopefully that'll be the only areas that need to be removed but it's uncertain as to whether or not the second third toes on the left foot will survive. Also like Dr. Alberteen Spindle and/or Dr. Ether Griffins take a look at him and compare his flow to prior to these most recent procedures and see if it looks like he is improved significantly enough to proceed with surgery. His peripheral arterial procedures seemed to help some but still not fully restore circulation to either foot, as would be expected. I would recommend he stay on oral antibiotic and it is my understanding is going to rehabilitation is fine he needs to wear socks and take his antibiotics and he needs an appointment to come back to see Dr. Ether Griffins Dr. Alberteen Spindle either later this week or early next week for reevaluation and possible surgical planning.  Active Problems:    Gangrene of lower extremity [facility])   Family Communication: Plan discussed with patient and family in the presence of a translator Epimenio Sarin M.D on 07/05/2016 at 2:26 PM  Thank you for the consult, we will follow the patient with you in the Hospital.

## 2016-07-06 ENCOUNTER — Encounter: Payer: Self-pay | Admitting: Vascular Surgery

## 2016-07-06 LAB — TYPE AND SCREEN
ABO/RH(D): A POS
ANTIBODY SCREEN: NEGATIVE
UNIT DIVISION: 0
UNIT DIVISION: 0

## 2016-07-06 LAB — BASIC METABOLIC PANEL
Anion gap: 8 (ref 5–15)
BUN: 31 mg/dL — AB (ref 6–20)
CHLORIDE: 97 mmol/L — AB (ref 101–111)
CO2: 27 mmol/L (ref 22–32)
CREATININE: 7.07 mg/dL — AB (ref 0.61–1.24)
Calcium: 7.7 mg/dL — ABNORMAL LOW (ref 8.9–10.3)
GFR calc non Af Amer: 7 mL/min — ABNORMAL LOW (ref >60)
GFR, EST AFRICAN AMERICAN: 9 mL/min — AB (ref >60)
Glucose, Bld: 176 mg/dL — ABNORMAL HIGH (ref 65–99)
POTASSIUM: 4.5 mmol/L (ref 3.5–5.1)
Sodium: 132 mmol/L — ABNORMAL LOW (ref 135–145)

## 2016-07-06 LAB — BPAM RBC
BLOOD PRODUCT EXPIRATION DATE: 201805232359
Blood Product Expiration Date: 201805232359
ISSUE DATE / TIME: 201805041125
ISSUE DATE / TIME: 201805041356
UNIT TYPE AND RH: 6200
Unit Type and Rh: 6200

## 2016-07-06 LAB — CBC
HCT: 20.7 % — ABNORMAL LOW (ref 40.0–52.0)
Hemoglobin: 6.8 g/dL — ABNORMAL LOW (ref 13.0–18.0)
MCH: 29.7 pg (ref 26.0–34.0)
MCHC: 32.9 g/dL (ref 32.0–36.0)
MCV: 90.2 fL (ref 80.0–100.0)
PLATELETS: 284 10*3/uL (ref 150–440)
RBC: 2.29 MIL/uL — ABNORMAL LOW (ref 4.40–5.90)
RDW: 15.7 % — AB (ref 11.5–14.5)
WBC: 9.1 10*3/uL (ref 3.8–10.6)

## 2016-07-06 LAB — HEPARIN LEVEL (UNFRACTIONATED)
Heparin Unfractionated: 0.4 IU/mL (ref 0.30–0.70)
Heparin Unfractionated: 0.44 IU/mL (ref 0.30–0.70)

## 2016-07-06 LAB — SURGICAL PATHOLOGY

## 2016-07-06 LAB — PROTIME-INR
INR: 1.77
Prothrombin Time: 20.8 seconds — ABNORMAL HIGH (ref 11.4–15.2)

## 2016-07-06 LAB — GLUCOSE, CAPILLARY
GLUCOSE-CAPILLARY: 152 mg/dL — AB (ref 65–99)
GLUCOSE-CAPILLARY: 175 mg/dL — AB (ref 65–99)
GLUCOSE-CAPILLARY: 201 mg/dL — AB (ref 65–99)
Glucose-Capillary: 151 mg/dL — ABNORMAL HIGH (ref 65–99)

## 2016-07-06 LAB — PREPARE RBC (CROSSMATCH)

## 2016-07-06 LAB — HEMOGLOBIN AND HEMATOCRIT, BLOOD
HCT: 24.7 % — ABNORMAL LOW (ref 40.0–52.0)
Hemoglobin: 8.3 g/dL — ABNORMAL LOW (ref 13.0–18.0)

## 2016-07-06 MED ORDER — SENNOSIDES-DOCUSATE SODIUM 8.6-50 MG PO TABS
1.0000 | ORAL_TABLET | Freq: Every day | ORAL | Status: DC
  Administered 2016-07-06 – 2016-07-08 (×3): 1 via ORAL
  Filled 2016-07-06 (×3): qty 1

## 2016-07-06 MED ORDER — NEPRO/CARBSTEADY PO LIQD
237.0000 mL | Freq: Two times a day (BID) | ORAL | Status: DC
  Administered 2016-07-07 – 2016-07-08 (×2): 237 mL via ORAL

## 2016-07-06 MED ORDER — WARFARIN SODIUM 5 MG PO TABS
5.0000 mg | ORAL_TABLET | Freq: Once | ORAL | Status: AC
  Administered 2016-07-06: 5 mg via ORAL
  Filled 2016-07-06: qty 1

## 2016-07-06 MED ORDER — SODIUM CHLORIDE 0.9 % IV SOLN
Freq: Once | INTRAVENOUS | Status: AC
  Administered 2016-07-06: 15:00:00 via INTRAVENOUS

## 2016-07-06 MED ORDER — POLYETHYLENE GLYCOL 3350 17 G PO PACK
17.0000 g | PACK | Freq: Every day | ORAL | Status: DC
  Administered 2016-07-06 – 2016-07-08 (×3): 17 g via ORAL
  Filled 2016-07-06 (×3): qty 1

## 2016-07-06 NOTE — Progress Notes (Signed)
[facility]  ROUNDING NOTE   Subjective:   History taken with assistance of Spanish interpeter. Denies any acute complaints today Ready for dialysis tomorrow Hemoglobin down to 6.8.  Blood transfusion planned  Objective:  Vital signs in last 24 hours:  Temp:  [97.7 F (36.5 C)-98.8 F (37.1 C)] 97.8 F (36.6 C) (05/07 1457) Pulse Rate:  [85-92] 90 (05/07 1457) Resp:  [11-20] 18 (05/07 1457) BP: (100-111)/(48-57) 100/55 (05/07 1457) SpO2:  [88 %-99 %] 99 % (05/07 1457)  Weight change:  Filed Weights   07/02/16 1330 07/03/16 0715 07/04/16 1050  Weight: 79.3 kg (174 lb 13.2 oz) 80 kg (176 lb 5.9 oz) 80.7 kg (177 lb 14.6 oz)    Intake/Output: I/O last 3 completed shifts: In: 1046 [P.O.:480; I.V.:566] Out: 350 [Urine:350]   Intake/Output this shift:  Total I/O In: 464.2 [I.V.:164.2; Blood:300] Out: 0   Physical Exam: General: No acute distress  Head: Normocephalic, atraumatic. Moist oral mucosal membranes  Eyes: Anicteric  Neck: Supple, trachea midline  Lungs:  Clear to auscultation, normal effort  Heart: S1S2 no rubs  Abdomen:  Soft, nontender, BS present  Extremities: No peripheral edema. Gangrene left 1st, 2nd and 3rd toe, right 2nd toe  Neurologic: Nonfocal, moving all four extremities  Skin: No lesions  Access: LUE AVF    Basic Metabolic Panel:  Recent Labs Lab 07/01/16 0440 07/04/16 0505 07/06/16 0016  NA 134* 132* 132*  K 4.4 5.3* 4.5  CL 97* 100* 97*  CO2 28 24 27   GLUCOSE 73 154* 176*  BUN 28* 33* 31*  CREATININE 7.51* 7.83* 7.07*  CALCIUM 8.1* 7.7* 7.7*    Liver Function Tests: No results for input(s): AST, ALT, ALKPHOS, BILITOT, PROT, ALBUMIN in the last 168 hours. No results for input(s): LIPASE, AMYLASE in the last 168 hours. No results for input(s): AMMONIA in the last 168 hours.  CBC:  Recent Labs Lab 07/02/16 1759 07/03/16 0519 07/03/16 1438 07/04/16 0505 07/06/16 0016  WBC 8.1 8.1 8.7 9.2 9.1  NEUTROABS 6.8*   --  5.9  --   --   HGB 8.6* 7.8* 8.3* 7.9* 6.8*  HCT 26.3* 23.3* 25.4* 24.0* 20.7*  MCV 89.3 88.1 88.6 87.7 90.2  PLT 240 227 241 255 284    Cardiac Enzymes: No results for input(s): CKTOTAL, CKMB, CKMBINDEX, TROPONINI in the last 168 hours.  BNP: Invalid input(s): POCBNP  CBG:  Recent Labs Lab 07/05/16 1630 07/05/16 2115 07/06/16 0720 07/06/16 1128 07/06/16 1640  GLUCAP 174* 136* 152* 175* 151*    Microbiology: Results for orders placed or performed during the hospital encounter of 06/24/16  MRSA PCR Screening     Status: None   Collection Time: 06/24/16  5:05 AM  Result Value Ref Range Status   MRSA by PCR NEGATIVE NEGATIVE Final    Comment:        The GeneXpert MRSA Assay (FDA approved for NASAL specimens only), is one component of a comprehensive MRSA colonization surveillance program. It is not intended to diagnose MRSA infection nor to guide or monitor treatment for MRSA infections.   CULTURE, BLOOD (ROUTINE X 2) w Reflex to ID Panel     Status: None   Collection Time: 06/27/16  7:22 AM  Result Value Ref Range Status   Specimen Description BLOOD RIGHT HAND  Final   Special Requests   Final    BOTTLES DRAWN AEROBIC AND ANAEROBIC Blood Culture adequate volume   Culture NO GROWTH 5 DAYS  Final  Report Status 07/02/2016 FINAL  Final  CULTURE, BLOOD (ROUTINE X 2) w Reflex to ID Panel     Status: None   Collection Time: 06/27/16  7:29 AM  Result Value Ref Range Status   Specimen Description BLOOD RIGHT ASSIST CONTROL  Final   Special Requests   Final    BOTTLES DRAWN AEROBIC AND ANAEROBIC Blood Culture adequate volume   Culture NO GROWTH 5 DAYS  Final   Report Status 07/02/2016 FINAL  Final    Coagulation Studies:  Recent Labs  07/04/16 0505 07/05/16 0538 07/06/16 0016  LABPROT 15.7* 18.5* 20.8*  INR 1.24 1.52 1.77    Urinalysis: No results for input(s): COLORURINE, LABSPEC, PHURINE, GLUCOSEU, HGBUR, BILIRUBINUR, KETONESUR, PROTEINUR,  UROBILINOGEN, NITRITE, LEUKOCYTESUR in the last 72 hours.  Invalid input(s): APPERANCEUR    Imaging: No results found.   Medications:    . amoxicillin-clavulanate  1 tablet Oral Q24H  . aspirin EC  81 mg Oral Daily  . atorvastatin  80 mg Oral QPM  . [START ON 07/07/2016] epoetin (EPOGEN/PROCRIT) injection  10,000 Units Intravenous Q T,Th,Sa-HD  . [START ON 07/07/2016] feeding supplement (NEPRO CARB STEADY)  237 mL Oral BID BM  . ferric citrate  420 mg Oral TID WC  . insulin aspart  0-5 Units Subcutaneous QHS  . insulin aspart  0-9 Units Subcutaneous TID WC  . lisinopril  2.5 mg Oral Daily  . living well with diabetes book   Does not apply Once  . Melatonin  5 mg Oral QHS  . pneumococcal 23 valent vaccine  0.5 mL Intramuscular Tomorrow-1000  . senna-docusate  1 tablet Oral Daily  . tamsulosin  0.4 mg Oral QHS  . Warfarin - Pharmacist Dosing Inpatient   Does not apply q1800   acetaminophen **OR** acetaminophen, benzonatate, bisacodyl, heparin, ipratropium-albuterol, lidocaine (PF), lidocaine-prilocaine, morphine injection, nitroGLYCERIN, ondansetron **OR** ondansetron (ZOFRAN) IV, ondansetron (ZOFRAN) IV, oxyCODONE, oxyCODONE, pentafluoroprop-tetrafluoroeth, phenol, phenol-menthol, polyethylene glycol  Assessment/ Plan:  61 y.o. Hispanic male ESRD on HD TTS, coronary artery disease, diabetes mellitus type 2, hyperlipidemia, hypertension, history of CVA, anemia chronic kidney disease, secondary hyperparathyroidism who presents with gangrenous changes on toes of both feet.  [facility] TTS [address] Left AVF   1.  ESRD on HD TTS:   Continue TTS schedule.   Dialysis in chair tomorrow  2.  Anemia chronic kidney disease:  Hemoglobin 6.8 - Epogen with dialysis.   - blood transfusion - stool hemoccult  3.  Secondary hyperparathyroidism.   - Auryxia with meals.  - monitor phos  4. Peripheral arterial disease:  Status post right lower extremity endarterectomies on 5/4 Dr.  Gilda CreaseSchnier.     LOS: 12 Alexandar Weisenberger 5/7/20185:47 PM

## 2016-07-06 NOTE — Progress Notes (Signed)
Initial Nutrition Assessment  DOCUMENTATION CODES:   Not applicable  INTERVENTION:  Recommend Nepro Shake po BID between meals, each supplement provides 425 kcal and 19 grams protein. Discussed with patient that he will only need these until appetite returns to baseline.  Recommend renal-appropriate multivitamin with minerals (Rena-vite) QHS.  Provided renal education (note to follow). Patient does not know his fluid restriction as the reported one he gave is far too low. He would benefit from fluid restriction during admission so he could learn appropriate fluid restriction for home.  NUTRITION DIAGNOSIS:   Inadequate oral intake related to poor appetite as evidenced by per patient/family report.  GOAL:   Patient will meet greater than or equal to 90% of their needs  MONITOR:   PO intake, Supplement acceptance, Labs, Weight trends, I & O's  REASON FOR ASSESSMENT:   LOS    ASSESSMENT:   61 year old male with PMHx of hyperlipidemia, HTN, CAD, DM type 2, hx CVA, ESRD on HD, hx CABG 01/2014, MI 08/2015, who presented on 4/25 with bilateral lower extremity pain/redness and necrotic toes, loss of vision in right eye.   -Per Neurology note patient with central retinal artery occlusion in setting of multiple stroke risk factors. Complete loss of vision perception in right eye. -Patient s/p vascular intervention for left lower extremity for limb salvage on 4/27. -Patient s/p angiogram 5/2. -Patient s/p vascular intervention for right lower extremity on 5/4. -Per chart may require amputations of left great toe and right second toe after observation for 1-2 weeks.  Spoke with patient and family member at bedside with Spanish interpreter Eddie Myers(Eddie Myers [phone]). Patient reports his appetite has been poor for the past 3 days. He is unsure why he has a poor appetite, but is just not hungry. Denies any N/V, abdominal pain, any other pain, constipation/diarrhea, or difficulty  chewing/swallowing. He reports for the past few days he has only been eating one meal per day (chicken, mashed potatoes or white rice). Reports he usually has two meals per day (breakfast and late lunch). He reports he has been on HD for the past year. Reports he has not ever received any education on renal diet. Patient reports he has a fluid restriction of 6 fl oz per day (180 ml). Discussed with RN that patient does not have a fluid restriction here - will attempt to find out fluid restriction from Nephrologist.  Patient reports his weight has increased since he started dialysis - reports he believes it is true weight gain and not due to fluid. He is unsure of dry weight. Earliest weight in chart was 145.5 lbs on 11/05/2015 and patient has since gained weight. During this admission patient has gained 4.9 kg, which may be due to fluid as patient is not on a fluid restriction. He was 73.1 kg after HD on 4/28 - will use this weight to estimate needs.  Medications reviewed and include: Augmentin, Epogen 10000 units every HD session, ferric citrate 420 mg TID with meals, Novolog sliding scale TID with meals and QHS, senna, warfarin, morphine PRN, oxycodone PRN.  Labs reviewed: CBG 136-175 past 24 hrs, Sodium 132, Chloride 97, BUN 31, Creatinine 7.07.  Nutrition-Focused physical exam completed. Findings are no fat depletion, no muscle depletion, and no edema.   I/O: 50 ml UOP yesterday and none so far today, one unmeasured occurrence of emesis yesterday  HD UF: 2 L on 4/26, 2 L on 4/28, 1.5 L on 5/3, 1.5 L on 5/5  Discussed  with RN.  Diet Order:  Diet renal/carb modified with fluid restriction Diet-HS Snack? Nothing; Room service appropriate? Yes; Fluid consistency: Thin  Skin:  Reviewed, no issues  Last BM:  07/02/2016 - type 5 per chart  Height:   Ht Readings from Last 1 Encounters:  07/03/16 5\' 8"  (1.727 m)    Weight:   Wt Readings from Last 1 Encounters:  07/04/16 177 lb 14.6 oz (80.7 kg)     Ideal Body Weight:  70 kg  BMI:  Body mass index is 27.05 kg/m.  Estimated Nutritional Needs:   Kcal:  1610-9604 (MSJ x 1.3-1.5)  Protein:  88-102 grams (1.2-1.4 grams/kg)  Fluid:  UOP + 1 L  EDUCATION NEEDS:   Education needs addressed  Helane Rima, MS, RD, LDN Pager: [phone] After Hours Pager: [phone]

## 2016-07-06 NOTE — NC FL2 (Signed)
[place] MEDICAID FL2 LEVEL OF CARE SCREENING TOOL     IDENTIFICATION  Patient Name: Eddie Myers Birthdate: 25-Dec-1955 Sex: male Admission Date (Current Location): 06/24/2016  [place] and [place] Number:  Chiropodist and Address:  [facility], [address] [place], [place] [address]      Provider Number: [id number]  Attending Physician Name and Address:  Ramonita Lab, MD  Relative Name and Phone Number:       Current Level of Care: Hospital Recommended Level of Care: Skilled Nursing Facility Prior Approval Number:    Date Approved/Denied:   PASRR Number:    Discharge Plan: SNF    Current Diagnoses: Patient Active Problem List   Diagnosis Date Noted  . Gangrene (HCC) 06/24/2016  . Vision loss of right eye 06/24/2016  . Gangrene of lower extremity (HCC) 06/24/2016  . Encounter for therapeutic drug monitoring 03/25/2016  . Paroxysmal atrial fibrillation (HCC) 03/07/2016  . Renal dialysis device, implant, or graft complication 01/22/2016  . ESRD on dialysis (HCC) 01/06/2016  . Type 2 diabetes mellitus with complication (HCC) 01/06/2016  . Anginal pain (HCC) 11/20/2015  . Hypertension 11/20/2015  . Coronary artery disease involving native coronary artery of native heart without angina pectoris 11/20/2015  . Cardiomyopathy, ischemic 11/20/2015  . Stroke (HCC) 11/20/2015    Orientation RESPIRATION BLADDER Height & Weight     Self, Time, Situation, Place  Normal Continent Weight: 177 lb 14.6 oz (80.7 kg) Height:  5\' 8"  (172.7 cm)  BEHAVIORAL SYMPTOMS/MOOD NEUROLOGICAL BOWEL NUTRITION STATUS   (none)  (none) Continent Diet (renal carb modified)  AMBULATORY STATUS COMMUNICATION OF NEEDS Skin   Extensive Assist Verbally Normal                       Personal Care Assistance Level of Assistance  Bathing, Dressing Bathing Assistance: Limited assistance   Dressing Assistance: Limited assistance     Functional Limitations  Info   (none)          SPECIAL CARE FACTORS FREQUENCY  PT (By licensed PT) (outpatient dialysis)                    Contractures Contractures Info: Not present    Additional Factors Info  Code Status, Allergies Code Status Info: full Allergies Info: nka           Current Medications (07/06/2016):  This is the current hospital active medication list Current Facility-Administered Medications  Medication Dose Route Frequency Provider Last Rate Last Dose  . 0.9 %  sodium chloride infusion   Intravenous Once Haylen Bellotti, MD      . acetaminophen (TYLENOL) tablet 650 mg  650 mg Oral Q6H PRN Houston Siren, MD   650 mg at 06/28/16 0403   Or  . acetaminophen (TYLENOL) suppository 650 mg  650 mg Rectal Q6H PRN Houston Siren, MD      . amoxicillin-clavulanate (AUGMENTIN) 500-125 MG per tablet 500 mg  1 tablet Oral Q24H Delfino Lovett, MD   500 mg at 07/06/16 1217  . aspirin EC tablet 81 mg  81 mg Oral Daily Schnier, Latina Craver, MD   81 mg at 07/06/16 1015  . atorvastatin (LIPITOR) tablet 80 mg  80 mg Oral QPM Houston Siren, MD   80 mg at 07/05/16 1724  . benzonatate (TESSALON) capsule 200 mg  200 mg Oral BID PRN Delfino Lovett, MD   200 mg at 06/29/16 1716  . bisacodyl (  DULCOLAX) suppository 10 mg  10 mg Rectal Daily PRN Kolluru, Threasa HeadsSarath, MD   10 mg at 07/02/16 1540  . clopidogrel (PLAVIX) tablet 75 mg  75 mg Oral Daily Schnier, Latina CraverGregory G, MD   75 mg at 07/06/16 1017  . [START ON 07/07/2016] epoetin alfa (EPOGEN,PROCRIT) injection 10,000 Units  10,000 Units Intravenous Q T,Th,Sa-HD Kolluru, Sarath, MD      . ferric citrate (AURYXIA) tablet 420 mg  420 mg Oral TID WC Sudini, Srikar, MD   420 mg at 07/06/16 1217  . heparin injection 1,000 Units  1,000 Units Dialysis PRN Lateef, Munsoor, MD      . insulin aspart (novoLOG) injection 0-5 Units  0-5 Units Subcutaneous QHS Houston SirenSainani, Vivek J, MD   2 Units at 07/02/16 2123  . insulin aspart (novoLOG) injection 0-9 Units  0-9 Units Subcutaneous  TID WC Houston SirenSainani, Vivek J, MD   3 Units at 07/06/16 1217  . ipratropium-albuterol (DUONEB) 0.5-2.5 (3) MG/3ML nebulizer solution 3 mL  3 mL Nebulization Q6H PRN Momin Misko, MD      . lidocaine (PF) (XYLOCAINE) 1 % injection 5 mL  5 mL Intradermal PRN Lateef, Munsoor, MD      . lidocaine-prilocaine (EMLA) cream 1 application  1 application Topical PRN Lateef, Munsoor, MD      . lisinopril (PRINIVIL,ZESTRIL) tablet 2.5 mg  2.5 mg Oral Daily Houston SirenSainani, Vivek J, MD   2.5 mg at 07/06/16 1015  . living well with diabetes book MISC   Does not apply Once Milagros LollSudini, Srikar, MD   Stopped at 06/25/16 1317  . Melatonin TABS 5 mg  5 mg Oral QHS Houston SirenSainani, Vivek J, MD   5 mg at 07/05/16 2218  . morphine 2 MG/ML injection 2 mg  2 mg Intravenous Q4H PRN Dravin Lance, MD   2 mg at 07/06/16 1018  . nitroGLYCERIN (NITROSTAT) SL tablet 0.4 mg  0.4 mg Sublingual Q5 min PRN Houston SirenSainani, Vivek J, MD      . ondansetron (ZOFRAN) tablet 4 mg  4 mg Oral Q6H PRN Houston SirenSainani, Vivek J, MD       Or  . ondansetron (ZOFRAN) injection 4 mg  4 mg Intravenous Q6H PRN Houston SirenSainani, Vivek J, MD      . ondansetron [facility](ZOFRAN) injection 4 mg  4 mg Intravenous Q6H PRN Stegmayer, Kimberly A, PA-C   4 mg at 07/05/16 1236  . oxyCODONE (Oxy IR/ROXICODONE) immediate release tablet 10 mg  10 mg Oral Q4H PRN Rolm BaptiseGilliam, Holly N, RPH   10 mg at 07/05/16 2218  . oxyCODONE (Oxy IR/ROXICODONE) immediate release tablet 5 mg  5 mg Oral Q4H PRN Daune Colgate, MD   5 mg at 07/06/16 1356  . pentafluoroprop-tetrafluoroeth (GEBAUERS) aerosol 1 application  1 application Topical PRN Lateef, Munsoor, MD      . phenol (CHLORASEPTIC) mouth spray 1 spray  1 spray Mouth/Throat PRN Oralia ManisWillis, David, MD      . phenol-menthol (CEPASTAT) lozenge 1 lozenge  1 lozenge Buccal PRN Oralia ManisWillis, David, MD   1 lozenge at 07/03/16 2332  . pneumococcal 23 valent vaccine (PNU-IMMUNE) injection 0.5 mL  0.5 mL Intramuscular Tomorrow-1000 Sainani, Vivek J, MD      . polyethylene glycol (MIRALAX / GLYCOLAX)  packet 17 g  17 g Oral Daily PRN Kolluru, Sarath, MD   17 g at 07/02/16 1538  . senna (SENOKOT) tablet 8.6 mg  1 tablet Oral Daily Milagros LollSudini, Srikar, MD   8.6 mg at 07/06/16 1015  . tamsulosin (FLOMAX) capsule 0.4  mg  0.4 mg Oral QHS Houston Siren, MD   0.4 mg at 07/05/16 2218  . warfarin (COUMADIN) tablet 5 mg  5 mg Oral ONCE-1800 Storey Stangeland, MD      . Warfarin - Pharmacist Dosing Inpatient   Does not apply q1800 Rolm Baptise, [facility]         Discharge Medications: Please see discharge summary for a list of discharge medications.  Relevant Imaging Results:  Relevant Lab Results:   Additional Information ss: [id number]  York Spaniel, LCSW

## 2016-07-06 NOTE — Progress Notes (Signed)
3 Days Post-Op   Subjective/Chief Complaint: Patient seen.   Objective: Vital signs in last 24 hours: Temp:  [98.1 F (36.7 C)-98.8 F (37.1 C)] 98.8 F (37.1 C) (05/07 0519) Pulse Rate:  [87-101] 92 (05/07 0551) Resp:  [11-20] 11 (05/07 0551) BP: (100-105)/(48-66) 102/48 (05/07 0519) SpO2:  [88 %-97 %] 97 % (05/07 0551) Last BM Date: 07/02/16  Intake/Output from previous day: 05/06 0701 - 05/07 0700 In: 926 [P.O.:360; I.V.:566] Out: 50 [Urine:50] Intake/Output this shift: Total I/O In: 50.2 [I.V.:50.2] Out: 0   Appearance to the skin on the left foot has somewhat improved. Gangrenous discoloration is confined to the hallux as well as somewhat on the dorsal left second and third toes. No active drainage and again the skin temperature and appearance has improved since I last saw him last week. Still some erythema with gangrenous changes in the right second toe but stable with no drainage.  Lab Results:   Recent Labs  07/04/16 0505 07/06/16 0016  WBC 9.2 9.1  HGB 7.9* 6.8*  HCT 24.0* 20.7*  PLT 255 284   BMET  Recent Labs  07/04/16 0505 07/06/16 0016  NA 132* 132*  K 5.3* 4.5  CL 100* 97*  CO2 24 27  GLUCOSE 154* 176*  BUN 33* 31*  CREATININE 7.83* 7.07*  CALCIUM 7.7* 7.7*   PT/INR  Recent Labs  07/05/16 0538 07/06/16 0016  LABPROT 18.5* 20.8*  INR 1.52 1.77   ABG No results for input(s): PHART, HCO3 in the last 72 hours.  Invalid input(s): PCO2, PO2  Studies/Results: No results found.  Anti-infectives: Anti-infectives    Start     Dose/Rate Route Frequency Ordered Stop   07/03/16 1546  ceFAZolin (ANCEF) IVPB 1 g/50 mL premix     1 g 100 mL/hr over 30 Minutes Intravenous Every 12 hours 07/03/16 1536 07/04/16 1014   07/03/16 0600  ceFAZolin (ANCEF) IVPB 1 g/50 mL premix    Comments:  Send with pt to OR   1 g 100 mL/hr over 30 Minutes Intravenous On call 07/02/16 1250 07/03/16 0814   07/01/16 0500  ceFAZolin (ANCEF) IVPB 1 g/50 mL premix     Comments:  Send with pt to OR   1 g 100 mL/hr over 30 Minutes Intravenous On call 06/30/16 1828 07/02/16 0500   06/30/16 1200  amoxicillin-clavulanate (AUGMENTIN) 500-125 MG per tablet 500 mg     1 tablet Oral Every 24 hours 06/30/16 0951     06/30/16 1000  amoxicillin-clavulanate (AUGMENTIN) 500-125 MG per tablet 500 mg  Status:  Discontinued     1 tablet Oral Every 12 hours 06/30/16 0726 06/30/16 0951   06/28/16 1000  piperacillin-tazobactam (ZOSYN) IVPB 3.375 g  Status:  Discontinued     3.375 g 12.5 mL/hr over 240 Minutes Intravenous Every 12 hours 06/28/16 0736 06/30/16 0726   06/27/16 1200  vancomycin (VANCOCIN) IVPB 750 mg/150 ml premix  Status:  Discontinued     750 mg 150 mL/hr over 60 Minutes Intravenous Every T-Th-Sa (Hemodialysis) 06/27/16 1122 06/27/16 1127   06/27/16 1200  vancomycin (VANCOCIN) 750 mg in sodium chloride 0.9 % 150 mL IVPB  Status:  Discontinued     750 mg 150 mL/hr over 60 Minutes Intravenous Every T-Th-Sa (Hemodialysis) 06/27/16 1127 06/30/16 0726   06/27/16 1100  vancomycin (VANCOCIN) 1,750 mg in sodium chloride 0.9 % 500 mL IVPB     1,750 mg 250 mL/hr over 120 Minutes Intravenous  Once 06/27/16 1055 06/27/16 1411   06/26/16  0730  ceFAZolin (ANCEF) IVPB 1 g/50 mL premix     1 g 100 mL/hr over 30 Minutes Intravenous  Once 06/26/16 0722 06/26/16 0842      Assessment/Plan: s/p Procedure(s): Peripheral Vascular Balloon Angioplasty (N/A) Lower Extremity Angiography Lower Extremity Intervention Assessment: Stable dry gangrenous changes bilateral with peripheral vascular disease status post intervention   Plan: Toes on the left foot appears stable and at this point would recommend observation over the next week or 2 to evaluate for continued improvement or demarcation prior to amputation. Hopefully we would be able to attempt isolated digit amputations rather than transmetatarsal. At this point I would also recommend allowing the right second toe to continue  to demarcate and will speak with vascular surgery about timing for amputation of the right second toe as well.  LOS: 12 days    Eddie Myers Eddie Myers 07/06/2016

## 2016-07-06 NOTE — Progress Notes (Signed)
[facility]  Daily Progress Note   Subjective: 3 Days Post-Op: Right common femoral, profunda femoris, and superficial femoral artery endarterectomies with separate core matrix patch angioplasty of the common and SFA and then a second patch on the profunda femoris. Introduction catheter into right posterior tibial artery with additional catheter placement within the right peroneal artery representing third order catheter placement with additional third order. Percutaneous transluminal and plasty to 3 mm right posterior tibial artery. Percutaneous transluminal angioplasty of the popliteal and distal SFA to 5-6 mm with Lutonix drug-eluting balloons.  Patient laying in bed comfortably however very tender to palpation to the right groin. Patient informs most of his pain is located in right groin with minimal radiating distally toward toes.   Objective: Vitals:   07/06/16 0551 07/06/16 1311 07/06/16 1425 07/06/16 1457  BP:  (!) 111/57 (!) 102/57 (!) 100/55  Pulse: 92 89 85 90  Resp: 11  20 18   Temp:  97.7 F (36.5 C) 98 F (36.7 C) 97.8 F (36.6 C)  TempSrc:  Oral Oral Oral  SpO2: 97% 96% 98% 99%  Weight:      Height:        Intake/Output Summary (Last 24 hours) at 07/06/16 1723 Last data filed at 07/06/16 1442  Gross per 24 hour  Intake            850.2 ml  Output                0 ml  Net            850.2 ml   Physical Exam: A&Ox3, NAD CV: RRR Pulmonary: CTA Bilaterally Abdomen: Soft, Non-tender, Non-distended Right Groin: Incision intact, clean and dry. Right Lower Extremity: Thigh soft, calf soft. Foot warm. Stable necrotic big toe. Unable to appreciate pedal pulses.    Laboratory: CBC    Component Value Date/Time   WBC 9.1 07/06/2016 0016   HGB 6.8 (L) 07/06/2016 0016   HCT 20.7 (L) 07/06/2016 0016   PLT 284 07/06/2016 0016   BMET    Component Value Date/Time   NA 132 (L) 07/06/2016 0016   K 4.5 07/06/2016 0016   CL 97 (L) 07/06/2016 0016   CO2 27 07/06/2016 0016   GLUCOSE 176 (H) 07/06/2016 0016   BUN 31 (H) 07/06/2016 0016   CREATININE 7.07 (H) 07/06/2016 0016   CALCIUM 7.7 (L) 07/06/2016 0016   GFRNONAA 7 (L) 07/06/2016 0016   GFRAA 9 (L) 07/06/2016 0016   Assessment/Planning: 61 year old male with ESRD and PAD s/p right femoral endarterectomy - POD #3 1) Patient with drop in H&H with black stools? - Agree with stopping heparin. Will also stop Plavix. He is currently getting a unit of PRBC for black stool and drop in H&H. 2) Patient is in considerable amount of pain - thigh soft, incision - clean, dry and intact - patient with extensive surgical intervention to femoral artery and profunda - will follow pain if continues may consider CT. 3) PT: Patient to work with PT.  4) Will follow.  Discussed with Julio AlmSchnier  Butch Otterson PA-C 07/06/2016 5:23 PM

## 2016-07-06 NOTE — Progress Notes (Signed)
[facility] [place] [facility]   PATIENT NAME: Eddie Myers    MR#:  [id number]  DATE OF BIRTH:  03-17-1955  SUBJECTIVE:  CHIEF COMPLAINT:  No chief complaint on file. PatientHad a right-sided endarterectomy 5/4.Reporting right groin pain is better , and black stool REVIEW OF SYSTEMS:    Review of Systems  Constitutional: Positive for malaise/fatigue. Negative for chills and fever.  HENT: Negative for sore throat.   Eyes: Negative for blurred vision, double vision and pain.  Respiratory: Negative for cough, hemoptysis, shortness of breath and wheezing.   Cardiovascular: Negative for chest pain, palpitations, orthopnea and leg swelling.  Gastrointestinal: Negative for abdominal pain, constipation, diarrhea, heartburn, nausea and vomiting.  Genitourinary: Negative for dysuria and hematuria.  Musculoskeletal: Negative for back pain and joint pain.  Skin: Negative for rash.  Neurological: Positive for weakness. Negative for sensory change, speech change, focal weakness and headaches.  Endo/Heme/Allergies: Does not bruise/bleed easily.  Psychiatric/Behavioral: Negative for depression. The patient is not nervous/anxious.    DRUG ALLERGIES:  No Known Allergies  VITALS:  Blood pressure (!) 100/55, pulse 90, temperature 97.8 F (36.6 C), temperature source Oral, resp. rate 18, height 5\' 8"  (1.727 m), weight 80.7 kg (177 lb 14.6 oz), SpO2 99 %.  PHYSICAL EXAMINATION:   Physical Exam  GENERAL:  61 y.o.-year-old patient lying in the bed with no acute distress.  EYES: Pupils equal, round, reactive to light and accommodation. No scleral icterus. Extraocular muscles intact.  HEENT: Head atraumatic, normocephalic. Oropharynx and nasopharynx clear.  NECK:  Supple, no jugular venous distention. No thyroid enlargement, no tenderness.  LUNGS: Normal breath sounds bilaterally, no wheezing, rales, rhonchi. No use of accessory muscles of respiration.  CARDIOVASCULAR: S1, S2  normal. No murmurs, rubs, or gallops.  ABDOMEN: Soft, nontender, nondistended. Bowel sounds present. No organomegaly or mass. Right groin area with clean incision, tender to touch EXTREMITIES: Toes bilaterally are discolored with dry gangrene of the right second toe and left foot great toe NEUROLOGIC: Cranial nerves II through XII are intact. No focal Motor or sensory deficits b/l.   PSYCHIATRIC: The patient is alert and oriented x 3.  SKIN: No obvious rash, lesion, or ulcer.  LABORATORY PANEL:   CBC  Recent Labs Lab 07/06/16 0016  WBC 9.1  HGB 6.8*  HCT 20.7*  PLT 284   ------------------------------------------------------------------------------------------------------------------ Chemistries   Recent Labs Lab 07/06/16 0016  NA 132*  K 4.5  CL 97*  CO2 27  GLUCOSE 176*  BUN 31*  CREATININE 7.07*  CALCIUM 7.7*   ------------------------------------------------------------------------------------------------------------------  Cardiac Enzymes No results for input(s): TROPONINI in the last 168 hours. ------------------------------------------------------------------------------------------------------------------  RADIOLOGY:  No results found.   ASSESSMENT AND PLAN:  Eddie Myers  is a 61 y.o. male with a known history of End-stage renal disease on hemodialysis, diabetes, hypertension, hyperlipidemia, history of previous CVA, history of coronary artery disease status post bypass who presented to the hospital from his cardiologist's office due to bilateral lower extremity necrotic toes redness and pain.  * Bilateral lower extremity cyanosis with necrotic toes --Cardiology has cleared the patient for surgery. Discussed with Dr. Lewie Loron, patient's recent nuclear stress test was normal  s/p Right lower extremity angiogram -  rt common femoral endarterectomy 07/04/16 with distal angioplasty .07/03/16 -On aspirin 81 mg, Plavix 75 mg and heparin drip while waiting for podiatry  consult,Discussed with Dr. Orland Jarred yesterday, not considering any surgeries at this time. Coumadin resumed. INR at 1.7 today, holding  heparin drip in  the interim. Discussed with vascular surgery 5/6 okay to continue aspirin 81 mg, Plavix 75 and Coumadin  * Melena  Hemoglobin trended down from 7.9-6.8. Transfuse 1 unit of blood. Monitor serial hemoglobin and hematocrit Will check stool for Hemoccult. Holding heparin drip, patient is on Coumadin, aspirin and Plavix. INR at 1.77 today Page vascular to update  * Fever likely due to gangrenous toes -Afebrile  - PO Augmentin to finish 7 days course.  Pain control with IV and oral pain medications Status post left lower extremity angiogram with angioplasty of left superficial femoral, popliteal and peroneal arteries.  * Subacute right sided vision loss-patient had symptoms about a month ago.  -CT head shows a age-indeterminate right sided thalamic CVA Appreciate Neurology input MRI shows old CVA  * Paroxysmal atrial fibrillation on Coumadin.  Coumadin held for angiogram, resumed now  . Will follow-up with vascular surgery and podiatry  * End-stage renal disease on hemodialysis-patient gets dialysis on Tuesday Thursday Saturday Getting dialysis per nephro. Dr. Thedore MinsSingh is planned to transfuse 1 more unit of blood  tomorrow during dialysis if needed  * Hypotension with h/o hypertension-continue lisinopril, Hold carvedilol, Norvasc.  * Hypoglycemia with h/o Diabetes type 2 without complication-stop lantus, continue SSI,   * Hyperlipidemia-continue atorvastatin  * Throat  Pain-resolved - Tessalon Perles as needed  -PT is recommending skilled nursing facility. Follow up with case management  Follow-up with vascular surgery, podiatry, nephrology and cardiology [facility]   All the records are reviewed and case discussed with Care Management/Social Worker. Management plans discussed with the patient with the help of Spanish interpreter,  nursing and they are in agreement.  CODE STATUS: FULL CODE  DVT Prophylaxis: SCDs  TOTAL TIME TAKING CARE OF THIS PATIENT: 35 minutes.   POSSIBLE D/C IN 2 DAYS, DEPENDING ON CLINICAL CONDITION.  Eddie Myers, Eddie Calender M.D on 07/06/2016 at 3:45 PM  Between 7am to 6pm - Pager - [phone]  After 6pm go to [email or web address] - password [email or web address]  [facility]  Office  [phone]  CC: Primary care physician; System, Pcp Not In  Note: This dictation was prepared with Dragon dictation along with smaller phrase technology. Any transcriptional errors that result from this process are unintentional.

## 2016-07-06 NOTE — Plan of Care (Signed)
Problem: Food- and Nutrition-Related Knowledge Deficit (NB-1.1) Goal: Nutrition education Formal process to instruct or train a patient/client in a skill or to impart knowledge to help patients/clients voluntarily manage or modify food choices and eating behavior to maintain or improve health. Outcome: Completed/Met Date Met: 07/06/16 Nutrition Education Note  RD identified need for Renal Education during initial assessment for length of stay. Completed education with Spanish interpreter. Provided "Chronic Kidney Disease Stage V Nutrition Therapy" handout to patient/family (Spanish version). Reviewed food groups and provided written recommended serving sizes specifically determined for patient's current nutritional status.   Explained why diet restrictions are needed and provided lists of foods to limit/avoid that are high potassium, sodium, and phosphorus. Provided specific recommendations on safer alternatives of these foods. Strongly encouraged compliance of this diet.   Discussed importance of protein intake at each meal and snack. Provided examples of how to maximize protein intake throughout the day. Discussed need for fluid restriction with dialysis, importance of minimizing weight gain between HD treatments, and renal-friendly beverage options.  Encouraged pt to discuss specific diet questions/concerns with RD at HD outpatient facility. Teach back method used.  Expect fair compliance.  Body mass index is 27.05 kg/m. Pt meets criteria for overweight based on current BMI.  Current diet order is Renal/Carbohydrate Modified, patient is consuming approximately 0-100% of meals at this time. Labs and medications reviewed.  Willey Blade, MS, RD, LDN Pager: [phone] After Hours Pager: [phone]

## 2016-07-06 NOTE — Progress Notes (Signed)
ANTICOAGULATION CONSULT NOTE - Follow Up Consult  Pharmacy Consult for warfarin Indication: AF  No Known Allergies  Patient Measurements: Height: 5\' 8"  (172.7 cm) Weight: 177 lb 14.6 oz (80.7 kg) IBW/kg (Calculated) : 68.4 Heparin Dosing Weight: 80 kg  Vital Signs: Temp: 98.4 F (36.9 C) (05/06 2007) Temp Source: Oral (05/06 2007) BP: 105/51 (05/06 2007) Pulse Rate: 89 (05/06 2007)  Labs:  Recent Labs  07/03/16 1438 07/04/16 0505 07/04/16 1053 07/04/16 2254 07/05/16 0537 07/05/16 0538 07/05/16 1319 07/06/16 0016  HGB 8.3* 7.9*  --   --   --   --   --  6.8*  HCT 25.4* 24.0*  --   --   --   --   --  20.7*  PLT 241 255  --   --   --   --   --  284  APTT  --   --  43*  --  109*  --   --   --   LABPROT  --  15.7*  --   --   --  18.5*  --  20.8*  INR  --  1.24  --   --   --  1.52  --  1.77  HEPARINUNFRC  --   --   --  0.10*  --   --  0.16* 0.40  CREATININE  --  7.83*  --   --   --   --   --  7.07*    Estimated Creatinine Clearance: 10.5 mL/min (A) (by C-G formula based on SCr of 7.07 mg/dL (H)).   Medical History: Past Medical History:  Diagnosis Date  . Anginal pain (HCC)   . Coronary artery disease   . Diabetes mellitus without complication (HCC)   . Dialysis patient (HCC)    Tues, Thurs, Sat  . Dyspnea    with exertion  . Elevated lipids   . Enlarged prostate   . ESRD (end stage renal disease) (HCC)    dialysis M-W-F  . Hypertension   . Myocardial infarction (HCC) 08/2015  . Stroke [facility](HCC)    Lt side this year July    Medications:  Infusions:  . heparin 1,800 Units/hr (07/05/16 1531)    Assessment: 61 yom cc necrotic toes, followed by nephrology for ESRD HD, vascular surgery for angiogram (5/4), and hospitalist. Admitted with supratherapeutic INR (3.5), bridged with UFH for procedure. INR reversed with Vitamin K 2 mg subcutaneously on 4/26. Pharmacy consulted to resume VKA post angio. Patient also started on heparin drip 5/5.  Per PTA med list,  patient takes warfarin 5 mg daily but is supposed to begin taking 2.5 mg on Monday and Friday.  Date INR Dose 5/2 1.05 5 mg 5/3 1.14 HELD 5/4 1.13 HELD 5/5 1.24 5 mg  5/6       1.52     5 mg 5/7       1.77     5 mg  Goal of Therapy:  INR 2-3 Monitor platelets by anticoagulation protocol: Yes   Plan:  Will give another warfarin 5 mg x 1 will follow PT/INR and dose appropriately   Fulton ReekMatt Kolsen Choe, PharmD, BCPS  07/06/16 2:31 AM

## 2016-07-06 NOTE — Progress Notes (Signed)
PT Cancellation Note  Patient Details Name: Eddie DraftsMauro Myers MRN: [id number] DOB: 03-24-1955   Cancelled Treatment:    Reason Eval/Treat Not Completed: Patient not medically ready   Chart reviewed.  HgB noted to be 6.8.  Held per therapy protocols.  Will continue as appropriate.   Danielle DessSarah Citlalic Norlander 07/06/2016, 11:51 AM

## 2016-07-07 ENCOUNTER — Inpatient Hospital Stay: Payer: Medicare Other

## 2016-07-07 ENCOUNTER — Encounter: Payer: Self-pay | Admitting: Vascular Surgery

## 2016-07-07 DIAGNOSIS — D649 Anemia, unspecified: Secondary | ICD-10-CM

## 2016-07-07 DIAGNOSIS — I2510 Atherosclerotic heart disease of native coronary artery without angina pectoris: Secondary | ICD-10-CM

## 2016-07-07 DIAGNOSIS — I480 Paroxysmal atrial fibrillation: Secondary | ICD-10-CM

## 2016-07-07 LAB — CBC
HCT: 21.1 % — ABNORMAL LOW (ref 40.0–52.0)
Hemoglobin: 7.2 g/dL — ABNORMAL LOW (ref 13.0–18.0)
MCH: 30.2 pg (ref 26.0–34.0)
MCHC: 34.2 g/dL (ref 32.0–36.0)
MCV: 88.1 fL (ref 80.0–100.0)
PLATELETS: 325 10*3/uL (ref 150–440)
RBC: 2.4 MIL/uL — AB (ref 4.40–5.90)
RDW: 15.4 % — AB (ref 11.5–14.5)
WBC: 7.9 10*3/uL (ref 3.8–10.6)

## 2016-07-07 LAB — BASIC METABOLIC PANEL
Anion gap: 10 (ref 5–15)
BUN: 45 mg/dL — AB (ref 6–20)
CALCIUM: 7.8 mg/dL — AB (ref 8.9–10.3)
CO2: 27 mmol/L (ref 22–32)
CREATININE: 8.99 mg/dL — AB (ref 0.61–1.24)
Chloride: 94 mmol/L — ABNORMAL LOW (ref 101–111)
GFR calc Af Amer: 6 mL/min — ABNORMAL LOW (ref >60)
GFR, EST NON AFRICAN AMERICAN: 6 mL/min — AB (ref >60)
Glucose, Bld: 167 mg/dL — ABNORMAL HIGH (ref 65–99)
POTASSIUM: 4.6 mmol/L (ref 3.5–5.1)
SODIUM: 131 mmol/L — AB (ref 135–145)

## 2016-07-07 LAB — PREPARE RBC (CROSSMATCH)

## 2016-07-07 LAB — GLUCOSE, CAPILLARY
GLUCOSE-CAPILLARY: 164 mg/dL — AB (ref 65–99)
GLUCOSE-CAPILLARY: 191 mg/dL — AB (ref 65–99)
Glucose-Capillary: 149 mg/dL — ABNORMAL HIGH (ref 65–99)
Glucose-Capillary: 173 mg/dL — ABNORMAL HIGH (ref 65–99)

## 2016-07-07 LAB — OCCULT BLOOD X 1 CARD TO LAB, STOOL
FECAL OCCULT BLD: NEGATIVE
Fecal Occult Bld: NEGATIVE

## 2016-07-07 LAB — PROTIME-INR
INR: 1.97
PROTHROMBIN TIME: 22.7 s — AB (ref 11.4–15.2)

## 2016-07-07 LAB — HEMOGLOBIN AND HEMATOCRIT, BLOOD
HEMATOCRIT: 28.1 % — AB (ref 40.0–52.0)
HEMOGLOBIN: 9.5 g/dL — AB (ref 13.0–18.0)

## 2016-07-07 MED ORDER — IOPAMIDOL (ISOVUE-300) INJECTION 61%
100.0000 mL | Freq: Once | INTRAVENOUS | Status: AC | PRN
  Administered 2016-07-07: 100 mL via INTRAVENOUS

## 2016-07-07 MED ORDER — SODIUM CHLORIDE 0.9 % IV SOLN: Freq: Once | INTRAVENOUS | Status: DC

## 2016-07-07 MED ORDER — LACTULOSE 10 GM/15ML PO SOLN
30.0000 g | ORAL | Status: AC
  Administered 2016-07-07: 30 g via ORAL
  Filled 2016-07-07: qty 60

## 2016-07-07 NOTE — Progress Notes (Signed)
Transfusion start

## 2016-07-07 NOTE — Progress Notes (Signed)
  End of hd 

## 2016-07-07 NOTE — Progress Notes (Signed)
HD START 

## 2016-07-07 NOTE — Progress Notes (Signed)
Physical Therapy Treatment Patient Details Name: Eddie Myers MRN: [id number] DOB: [birth year] Today's Date: 07/07/2016    History of Present Illness 61 y.o. Hispanic male ESRD on HD TTS, coronary artery disease, diabetes mellitus type 2, hyperlipidemia, hypertension, history of CVA, anemia chronic kidney disease, secondary hyperparathyroidism who presents with gangrenous changes on toes of both feet.Status post left lower extremity angiogram with angioplasty of left superficial femoral, popliteal and peroneal arteries  Right common femoral, profunda femoris, and superficial femoral artery endarterectomies and patch angioplasty separately to the PFA alone and the CFA/SFA combinedStatus post left lower extremity angiogram with angioplasty of left superficial femoral, popliteal and peroneal arteries.    PT Comments    Pt in bed, agrees to session.  Pt with mod assist for bed mobility.  He was able to stand with min/mod a x 1 then ambulate 20' with slow cautious gait.  Pt without LOB but generally unsteady due to weakness and pain and unsafe to ambulate without assist.  Verbal cues required for hand placements for safety.  Pt remained out of bed in recliner at end of session.   SNF remain an appropriate discharge plan in increase overall functional independence.   Follow Up Recommendations  SNF     Equipment Recommendations  Rolling walker with 5" wheels    Recommendations for Other Services       Precautions / Restrictions Precautions Precautions: Fall Restrictions Weight Bearing Restrictions: No    Mobility  Bed Mobility Overal bed mobility: Needs Assistance Bed Mobility: Sit to Supine       Sit to supine: Mod assist      Transfers Overall transfer level: Needs assistance Equipment used: Rolling walker (2 wheeled) Transfers: Sit to/from Stand Sit to Stand: Min assist;Mod assist            Ambulation/Gait Ambulation/Gait assistance: Min assist Ambulation Distance  (Feet): 20 Feet Assistive device: Rolling walker (2 wheeled) Gait Pattern/deviations: Step-to pattern   Gait velocity interpretation: <1.8 ft/sec, indicative of risk for recurrent falls     Stairs            Wheelchair Mobility    Modified Rankin (Stroke Patients Only)       Balance Overall balance assessment: Needs assistance Sitting-balance support: Feet supported Sitting balance-Leahy Scale: Good     Standing balance support: Bilateral upper extremity supported Standing balance-Leahy Scale: Fair                 High Level Balance Comments: affected by pain            Cognition Arousal/Alertness: Awake/alert Behavior During Therapy: WFL for tasks assessed/performed Overall Cognitive Status: Within Functional Limits for tasks assessed                                        Exercises      General Comments        Pertinent Vitals/Pain Pain Assessment: Faces Faces Pain Scale: Hurts even more Pain Location: increased with mobility Pain Descriptors / Indicators: Moaning Pain Intervention(s): Limited activity within patient's tolerance;Repositioned    Home Living                      Prior Function            PT Goals (current goals can now be found in the care plan section) Progress towards PT goals: Progressing toward  goals    Frequency    Min 2X/week      PT Plan      Co-evaluation              AM-PAC PT "6 Clicks" Daily Activity  Outcome Measure  Difficulty turning over in bed (including adjusting bedclothes, sheets and blankets)?: Total Difficulty moving from lying on back to sitting on the side of the bed? : Total Difficulty sitting down on and standing up from a chair with arms (e.g., wheelchair, bedside commode, etc,.)?: Total Help needed moving to and from a bed to chair (including a wheelchair)?: Total Help needed walking in hospital room?: A Little Help needed climbing 3-5 steps with a  railing? : A Lot 6 Click Score: 9    End of Session Equipment Utilized During Treatment: Gait belt Activity Tolerance: No increased pain;Patient limited by fatigue Patient left: in chair;with chair alarm set;with family/visitor present;with call bell/phone within reach   Pain - part of body: Leg     Time: [id number] PT Time Calculation (min) (ACUTE ONLY): 13 min  Charges:  $Gait Training: 8-22 mins                    G Codes:       Danielle DessSarah Makayela Secrest, PTA 07/07/16, 11:27 AM

## 2016-07-07 NOTE — Progress Notes (Signed)
[facility]  ROUNDING NOTE   Subjective:     HEMODIALYSIS FLOWSHEET:  Blood Flow Rate (mL/min): 400 mL/min Arterial Pressure (mmHg): -150 mmHg Venous Pressure (mmHg): 200 mmHg Transmembrane Pressure (mmHg): 50 mmHg Ultrafiltration Rate (mL/min): 690 mL/min Dialysate Flow Rate (mL/min): 600 ml/min Conductivity: Machine : 14.3 Conductivity: Machine : 14.3 Dialysis Fluid Bolus: Normal Saline Bolus Amount (mL): 250 mL Dialysate Change:  (3) Intra-Hemodialysis Comments: 2000. ended    Objective:  Vital signs in last 24 hours:  Temp:  [97.8 F (36.6 C)-98.5 F (36.9 C)] 98 F (36.7 C) (05/08 1555) Pulse Rate:  [80-96] 95 (05/08 1604) Resp:  [9-20] 14 (05/08 1604) BP: (99-147)/(54-86) 145/80 (05/08 1604) SpO2:  [95 %-100 %] 98 % (05/08 1604)  Weight change:  Filed Weights   07/02/16 1330 07/03/16 0715 07/04/16 1050  Weight: 79.3 kg (174 lb 13.2 oz) 80 kg (176 lb 5.9 oz) 80.7 kg (177 lb 14.6 oz)    Intake/Output: I/O last 3 completed shifts: In: 1390.2 [P.O.:360; I.V.:730.2; Blood:300] Out: 50 [Urine:50]   Intake/Output this shift:  Total I/O In: 1120 [P.O.:480; Blood:640] Out: 180 [Other:180]  Physical Exam: General: No acute distress  Head: Normocephalic, atraumatic. Moist oral mucosal membranes  Eyes: Anicteric  Neck: Supple, trachea midline  Lungs:  Clear to auscultation, normal effort  Heart: S1S2 no rubs  Abdomen:  Soft, nontender,    Extremities: No peripheral edema. Gangrene left 1st, 2nd and 3rd toe, right 2nd toe  Neurologic: Nonfocal, moving all four extremities  Skin: No lesions  Access: LUE AVF    Basic Metabolic Panel:  Recent Labs Lab 07/01/16 0440 07/04/16 0505 07/06/16 0016 07/07/16 0501  NA 134* 132* 132* 131*  K 4.4 5.3* 4.5 4.6  CL 97* 100* 97* 94*  CO2 28 24 27 27   GLUCOSE 73 154* 176* 167*  BUN 28* 33* 31* 45*  CREATININE 7.51* 7.83* 7.07* 8.99*  CALCIUM 8.1* 7.7* 7.7* 7.8*    Liver Function Tests: No  results for input(s): AST, ALT, ALKPHOS, BILITOT, PROT, ALBUMIN in the last 168 hours. No results for input(s): LIPASE, AMYLASE in the last 168 hours. No results for input(s): AMMONIA in the last 168 hours.  CBC:  Recent Labs Lab 07/02/16 1759 07/03/16 0519 07/03/16 1438 07/04/16 0505 07/06/16 0016 07/06/16 1931 07/07/16 0501  WBC 8.1 8.1 8.7 9.2 9.1  --  7.9  NEUTROABS 6.8*  --  5.9  --   --   --   --   HGB 8.6* 7.8* 8.3* 7.9* 6.8* 8.3* 7.2*  HCT 26.3* 23.3* 25.4* 24.0* 20.7* 24.7* 21.1*  MCV 89.3 88.1 88.6 87.7 90.2  --  88.1  PLT 240 227 241 255 284  --  325    Cardiac Enzymes: No results for input(s): CKTOTAL, CKMB, CKMBINDEX, TROPONINI in the last 168 hours.  BNP: Invalid input(s): POCBNP  CBG:  Recent Labs Lab 07/06/16 1640 07/06/16 2114 07/07/16 0802 07/07/16 1150 07/07/16 1705  GLUCAP 151* 201* 164* 191* 149*    Microbiology: Results for orders placed or performed during the hospital encounter of 06/24/16  MRSA PCR Screening     Status: None   Collection Time: 06/24/16  5:05 AM  Result Value Ref Range Status   MRSA by PCR NEGATIVE NEGATIVE Final    Comment:        The GeneXpert MRSA Assay (FDA approved for NASAL specimens only), is one component of a comprehensive MRSA colonization surveillance program. It is not intended to diagnose MRSA infection nor  to guide or monitor treatment for MRSA infections.   CULTURE, BLOOD (ROUTINE X 2) w Reflex to ID Panel     Status: None   Collection Time: 06/27/16  7:22 AM  Result Value Ref Range Status   Specimen Description BLOOD RIGHT HAND  Final   Special Requests   Final    BOTTLES DRAWN AEROBIC AND ANAEROBIC Blood Culture adequate volume   Culture NO GROWTH 5 DAYS  Final   Report Status 07/02/2016 FINAL  Final  CULTURE, BLOOD (ROUTINE X 2) w Reflex to ID Panel     Status: None   Collection Time: 06/27/16  7:29 AM  Result Value Ref Range Status   Specimen Description BLOOD RIGHT ASSIST CONTROL  Final    Special Requests   Final    BOTTLES DRAWN AEROBIC AND ANAEROBIC Blood Culture adequate volume   Culture NO GROWTH 5 DAYS  Final   Report Status 07/02/2016 FINAL  Final    Coagulation Studies:  Recent Labs  07/05/16 0538 07/06/16 0016 07/07/16 0501  LABPROT 18.5* 20.8* 22.7*  INR 1.52 1.77 1.97    Urinalysis: No results for input(s): COLORURINE, LABSPEC, PHURINE, GLUCOSEU, HGBUR, BILIRUBINUR, KETONESUR, PROTEINUR, UROBILINOGEN, NITRITE, LEUKOCYTESUR in the last 72 hours.  Invalid input(s): APPERANCEUR    Imaging: Ct Pelvis W Contrast  Result Date: 07/07/2016 CLINICAL DATA:  Severe right groin pain. Drop in hematocrit and hemoglobin. Status post right common femoral, profunda femoris and superficial femoral artery endarterectomies with separate core matrix patch angioplasty of the common and superficial femoral arteries and second patchy angioplasty of the profunda femoris artery on 07/03/2016. Additional angioplasties of the right popliteal and right posterior tibial arteries. EXAM: CT PELVIS AND BILATERAL LOWER EXTREMITIES WITH CONTRAST TECHNIQUE: Multidetector CT imaging of the pelvis and bilateral lower extremities was performed according to the standard protocol following bolus administration of intravenous contrast. Multiplanar CT image reconstructions were also generated. CONTRAST:  100mL ISOVUE-300 IOPAMIDOL (ISOVUE-300) INJECTION 61% COMPARISON:  None. FINDINGS: CT PELVIS FINDINGS Urinary Tract:  Unremarkable urinary bladder and distal ureters. Bowel:  Unremarkable. Vascular/Lymphatic: Atheromatous arterial calcifications and plaques. Reproductive:  Normal sized prostate gland. Other: Small bilateral inguinal hernias containing fat. Mild right groin. Vascular and subcutaneous edema and postoperative air, extending superiorly and inferiorly. No discrete abscess or hematoma seen. Musculoskeletal: Mild lower lumbar spine degenerative changes. Left iliac bone cyst. CT LEFT/RIGHT LOWER  EXTREMITY FINDINGS Bones/Joint/Cartilage Unremarkable. Ligaments Suboptimally assessed by CT. Muscles and Tendons Mild low density in the lateral aspect of the proximal adductor longus muscle on the right. No significant enlargement of the masses Soft tissues Mild diffuse subcutaneous edema anteriorly, laterally and medially in the proximal right thigh. Vascular: Multifocal arterial atheromatous calcifications and plaques. IMPRESSION: 1. Right groin postoperative changes with mild associated soft tissue edema and air, including mild edema in the lateral aspect of the proximal adductor longus muscle on the right. No visible hematoma. 2. Extensive bilateral arterial atheromatous changes. 3. Small bilateral inguinal hernias containing fat. Electronically Signed   By: Beckie SaltsSteven  Reid M.D.   On: 07/07/2016 13:28   Ct Extrem Lower W Cm Bil  Result Date: 07/07/2016 CLINICAL DATA:  Severe right groin pain. Drop in hematocrit and hemoglobin. Status post right common femoral, profunda femoris and superficial femoral artery endarterectomies with separate core matrix patch angioplasty of the common and superficial femoral arteries and second patchy angioplasty of the profunda femoris artery on 07/03/2016. Additional angioplasties of the right popliteal and right posterior tibial arteries. EXAM: CT PELVIS AND BILATERAL  LOWER EXTREMITIES WITH CONTRAST TECHNIQUE: Multidetector CT imaging of the pelvis and bilateral lower extremities was performed according to the standard protocol following bolus administration of intravenous contrast. Multiplanar CT image reconstructions were also generated. CONTRAST:  ISOVUE-300 IOPAMIDOL (ISOVUE-300) INJECTION 61% COMPARISON:  None. FINDINGS: CT PELVIS FINDINGS Urinary Tract:  Unremarkable urinary bladder and distal ureters. Bowel:  Unremarkable. Vascular/Lymphatic: Atheromatous arterial calcifications and plaques. Reproductive:  Normal sized prostate gland. Other: Small bilateral  inguinal hernias containing fat. Mild right groin. Vascular and subcutaneous edema and postoperative air, extending superiorly and inferiorly. No discrete abscess or hematoma seen. Musculoskeletal: Mild lower lumbar spine degenerative changes. Left iliac bone cyst. CT LEFT/RIGHT LOWER EXTREMITY FINDINGS Bones/Joint/Cartilage Unremarkable. Ligaments Suboptimally assessed by CT. Muscles and Tendons Mild low density in the lateral aspect of the proximal adductor longus muscle on the right. No significant enlargement of the masses Soft tissues Mild diffuse subcutaneous edema anteriorly, laterally and medially in the proximal right thigh. Vascular: Multifocal arterial atheromatous calcifications and plaques. IMPRESSION: 1. Right groin postoperative changes with mild associated soft tissue edema and air, including mild edema in the lateral aspect of the proximal adductor longus muscle on the right. No visible hematoma. 2. Extensive bilateral arterial atheromatous changes. 3. Small bilateral inguinal hernias containing fat. Electronically Signed   By: Beckie Salts M.D.   On: 07/07/2016 13:28     Medications:   . sodium chloride     . amoxicillin-clavulanate  1 tablet Oral Q24H  . atorvastatin  80 mg Oral QPM  . epoetin (EPOGEN/PROCRIT) injection  10,000 Units Intravenous Q T,Th,Sa-HD  . feeding supplement (NEPRO CARB STEADY)  237 mL Oral BID BM  . ferric citrate  420 mg Oral TID WC  . insulin aspart  0-5 Units Subcutaneous QHS  . insulin aspart  0-9 Units Subcutaneous TID WC  . lisinopril  2.5 mg Oral Daily  . living well with diabetes book   Does not apply Once  . Melatonin  5 mg Oral QHS  . pneumococcal 23 valent vaccine  0.5 mL Intramuscular Tomorrow-1000  . polyethylene glycol  17 g Oral Daily  . senna-docusate  1 tablet Oral Daily  . tamsulosin  0.4 mg Oral QHS   acetaminophen **OR** acetaminophen, benzonatate, bisacodyl, heparin, ipratropium-albuterol, lidocaine (PF), lidocaine-prilocaine,  morphine injection, nitroGLYCERIN, ondansetron **OR** ondansetron (ZOFRAN) IV, ondansetron (ZOFRAN) IV, oxyCODONE, oxyCODONE, pentafluoroprop-tetrafluoroeth, phenol, phenol-menthol, polyethylene glycol  Assessment/ Plan:  61 y.o. Hispanic male ESRD on HD TTS, coronary artery disease, diabetes mellitus type 2, hyperlipidemia, hypertension, history of CVA, anemia chronic kidney disease, secondary hyperparathyroidism who presents with gangrenous changes on toes of both feet.  [facility] TTS [address] Left AVF   1.  ESRD on HD TTS:   Continue TTS schedule.   Patient seen during dialysis Tolerating well   2.  Anemia chronic kidney disease:  Hemoglobin low - Epogen with dialysis.   - blood transfusion - stool hemoccult  3.  Secondary hyperparathyroidism.   - Auryxia with meals.  - monitor phos  4. Peripheral arterial disease:  Status post right lower extremity endarterectomies on 5/4 Dr. Gilda Crease.     LOS: 13 Zanita Millman 5/8/20185:39 PM

## 2016-07-07 NOTE — Progress Notes (Signed)
Progress Note  Patient Name: Eddie Myers DraftsMauro Whittingham Date of Encounter: 07/07/2016  Primary Cardiologist: Serrena Linderman  Subjective   I was asked to reevaluate patient by Dr. Amado CoeGouru due to worsening anemia and previous anticoagulation for paroxysmal atrial fibrillation. [name] complains only of right groin pain at site of femoral endarterectomy incision. He reports his feet are feeling better. He denies chest pain, shortness of breath, and palpitations. [name] denies noticing any active bleeding.  Inpatient Medications    Scheduled Meds: . amoxicillin-clavulanate  1 tablet Oral Q24H  . atorvastatin  80 mg Oral QPM  . epoetin (EPOGEN/PROCRIT) injection  10,000 Units Intravenous Q T,Th,Sa-HD  . feeding supplement (NEPRO CARB STEADY)  237 mL Oral BID BM  . ferric citrate  420 mg Oral TID WC  . insulin aspart  0-5 Units Subcutaneous QHS  . insulin aspart  0-9 Units Subcutaneous TID WC  . lisinopril  2.5 mg Oral Daily  . living well with diabetes book   Does not apply Once  . Melatonin  5 mg Oral QHS  . pneumococcal 23 valent vaccine  0.5 mL Intramuscular Tomorrow-1000  . polyethylene glycol  17 g Oral Daily  . senna-docusate  1 tablet Oral Daily  . tamsulosin  0.4 mg Oral QHS   Continuous Infusions: . sodium chloride     PRN Meds: acetaminophen **OR** acetaminophen, benzonatate, bisacodyl, heparin, ipratropium-albuterol, lidocaine (PF), lidocaine-prilocaine, morphine injection, nitroGLYCERIN, ondansetron **OR** ondansetron (ZOFRAN) IV, ondansetron (ZOFRAN) IV, oxyCODONE, oxyCODONE, pentafluoroprop-tetrafluoroeth, phenol, phenol-menthol, polyethylene glycol   Vital Signs    Vitals:   07/07/16 1545 07/07/16 1555 07/07/16 1604 07/07/16 2025  BP: 126/75 (!) 147/86 (!) 145/80 (!) 103/54  Pulse: 86 96 95 81  Resp: 10 11 14 18   Temp:  98 F (36.7 C)  98.2 F (36.8 C)  TempSrc:  Oral  Oral  SpO2: 100% 99% 98% 93%  Weight:      Height:        Intake/Output Summary (Last 24 hours) at  07/07/16 2211 Last data filed at 07/07/16 2058  Gross per 24 hour  Intake             1120 ml  Output              180 ml  Net              940 ml   Filed Weights   07/02/16 1330 07/03/16 0715 07/04/16 1050  Weight: 174 lb 13.2 oz (79.3 kg) 176 lb 5.9 oz (80 kg) 177 lb 14.6 oz (80.7 kg)    Telemetry    Normal sinus rhythm with rare PACs - Personally Reviewed  ECG   None  Physical Exam   ZOX:[id number]:Well-developed, well-nourished man lying in bed. He appears pale. Neck: No JVD or HJR. Cardiac: RRR, no murmurs, rubs, or gallops.  Respiratory:  clear anteriorly without wheezes or crackles. Normal work of breathing. Patient has a hard time sitting up due to right groin pain. GI: Soft, nontender, non-distended  MS:  blackish discoloration of the left great and right second toes is notable. Mild erythema of the dorsal aspects of both feet is present. Right femoral arteriotomy incision is well healed with minimal erythema. Notable tenderness to soft palpation is present. Neuro:  Nonfocal  Psych: Normal affect   Labs    Chemistry Recent Labs Lab 07/04/16 0505 07/06/16 0016 07/07/16 0501  NA 132* 132* 131*  K 5.3* 4.5 4.6  CL 100* 97* 94*  CO2 24 27  27  GLUCOSE 154* 176* 167*  BUN 33* 31* 45*  CREATININE 7.83* 7.07* 8.99*  CALCIUM 7.7* 7.7* 7.8*  GFRNONAA 7* 7* 6*  GFRAA 8* 9* 6*  ANIONGAP 8 8 10      Hematology Recent Labs Lab 07/04/16 0505 07/06/16 0016 07/06/16 1931 07/07/16 0501 07/07/16 1708  WBC 9.2 9.1  --  7.9  --   RBC 2.73* 2.29*  --  2.40*  --   HGB 7.9* 6.8* 8.3* 7.2* 9.5*  HCT 24.0* 20.7* 24.7* 21.1* 28.1*  MCV 87.7 90.2  --  88.1  --   MCH 28.9 29.7  --  30.2  --   MCHC 33.0 32.9  --  34.2  --   RDW 15.5* 15.7*  --  15.4*  --   PLT 255 284  --  325  --     Cardiac EnzymesNo results for input(s): TROPONINI in the last 168 hours. No results for input(s): TROPIPOC in the last 168 hours.   BNPNo results for input(s): BNP, PROBNP in the last 168 hours.     DDimer No results for input(s): DDIMER in the last 168 hours.   Radiology    Ct Pelvis W Contrast  Result Date: 07/07/2016 CLINICAL DATA:  Severe right groin pain. Drop in hematocrit and hemoglobin. Status post right common femoral, profunda femoris and superficial femoral artery endarterectomies with separate core matrix patch angioplasty of the common and superficial femoral arteries and second patchy angioplasty of the profunda femoris artery on 07/03/2016. Additional angioplasties of the right popliteal and right posterior tibial arteries. EXAM: CT PELVIS AND BILATERAL LOWER EXTREMITIES WITH CONTRAST TECHNIQUE: Multidetector CT imaging of the pelvis and bilateral lower extremities was performed according to the standard protocol following bolus administration of intravenous contrast. Multiplanar CT image reconstructions were also generated. CONTRAST:  ISOVUE-300 IOPAMIDOL (ISOVUE-300) INJECTION 61% COMPARISON:  None. FINDINGS: CT PELVIS FINDINGS Urinary Tract:  Unremarkable urinary bladder and distal ureters. Bowel:  Unremarkable. Vascular/Lymphatic: Atheromatous arterial calcifications and plaques. Reproductive:  Normal sized prostate gland. Other: Small bilateral inguinal hernias containing fat. Mild right groin. Vascular and subcutaneous edema and postoperative air, extending superiorly and inferiorly. No discrete abscess or hematoma seen. Musculoskeletal: Mild lower lumbar spine degenerative changes. Left iliac bone cyst. CT LEFT/RIGHT LOWER EXTREMITY FINDINGS Bones/Joint/Cartilage Unremarkable. Ligaments Suboptimally assessed by CT. Muscles and Tendons Mild low density in the lateral aspect of the proximal adductor longus muscle on the right. No significant enlargement of the masses Soft tissues Mild diffuse subcutaneous edema anteriorly, laterally and medially in the proximal right thigh. Vascular: Multifocal arterial atheromatous calcifications and plaques. IMPRESSION: 1. Right groin  postoperative changes with mild associated soft tissue edema and air, including mild edema in the lateral aspect of the proximal adductor longus muscle on the right. No visible hematoma. 2. Extensive bilateral arterial atheromatous changes. 3. Small bilateral inguinal hernias containing fat. Electronically Signed   By: Beckie Salts M.D.   On: 07/07/2016 13:28   Ct Extrem Lower W Cm Bil  Result Date: 07/07/2016 CLINICAL DATA:  Severe right groin pain. Drop in hematocrit and hemoglobin. Status post right common femoral, profunda femoris and superficial femoral artery endarterectomies with separate core matrix patch angioplasty of the common and superficial femoral arteries and second patchy angioplasty of the profunda femoris artery on 07/03/2016. Additional angioplasties of the right popliteal and right posterior tibial arteries. EXAM: CT PELVIS AND BILATERAL LOWER EXTREMITIES WITH CONTRAST TECHNIQUE: Multidetector CT imaging of the pelvis and bilateral lower extremities was performed  according to the standard protocol following bolus administration of intravenous contrast. Multiplanar CT image reconstructions were also generated. CONTRAST:  ISOVUE-300 IOPAMIDOL (ISOVUE-300) INJECTION 61% COMPARISON:  None. FINDINGS: CT PELVIS FINDINGS Urinary Tract:  Unremarkable urinary bladder and distal ureters. Bowel:  Unremarkable. Vascular/Lymphatic: Atheromatous arterial calcifications and plaques. Reproductive:  Normal sized prostate gland. Other: Small bilateral inguinal hernias containing fat. Mild right groin. Vascular and subcutaneous edema and postoperative air, extending superiorly and inferiorly. No discrete abscess or hematoma seen. Musculoskeletal: Mild lower lumbar spine degenerative changes. Left iliac bone cyst. CT LEFT/RIGHT LOWER EXTREMITY FINDINGS Bones/Joint/Cartilage Unremarkable. Ligaments Suboptimally assessed by CT. Muscles and Tendons Mild low density in the lateral aspect of the proximal  adductor longus muscle on the right. No significant enlargement of the masses Soft tissues Mild diffuse subcutaneous edema anteriorly, laterally and medially in the proximal right thigh. Vascular: Multifocal arterial atheromatous calcifications and plaques. IMPRESSION: 1. Right groin postoperative changes with mild associated soft tissue edema and air, including mild edema in the lateral aspect of the proximal adductor longus muscle on the right. No visible hematoma. 2. Extensive bilateral arterial atheromatous changes. 3. Small bilateral inguinal hernias containing fat. Electronically Signed   By: Beckie Salts M.D.   On: 07/07/2016 13:28    Cardiac Studies   None  Patient Profile     61 y.o. 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  Assessment & Plan    Normocytic anemia This is most likely multifactorial, including blood loss from recent vascular interventions and chronic disease including his PVD with gangrene and Julisa Flippo-stage renal disease. Questionable melanotic stools also documented, though Hemoccult was negative. Though Mr. Berent certainly warrants anticoagulation under normal settings with a CHADSVASC score of at least 5, it is reasonable to hold anticoagulation in the setting of acute blood loss anemia requiring blood transfusion. His hemoglobin responded appropriately to blood transfusion today.  I would advocate for restarting low-dose aspirin when it is felt to be safe from a bleeding standpoint. Long-term, reinitiation of therapeutic anticoagulation with warfarin encouraged given elevated stroke risk. Once INR is therapeutic, aspirin could be discontinued to minimize risk of bleeding from a cardiac  standpoint.  Consider iron studies, as well as workup for potential hemolysis, though recent transfusion may skew results somewhat; I will defer this to the primary service.  Continue epoetin per nephrology and internal medicine services.  Paroxysmal atrial fibrillation Telemetry demonstrates sinus rhythm.  Consider restarting carvedilol.  Restart anticoagulation when felt safe to do so surgical/bleeding standpoint. Would advocate for long-term anticoagulation with warfarin, though at least low-dose aspirin should be considered if therapeutic warfarin is felt to be too risky.  Coronary artery disease No symptoms of myocardial ischemia.  Continue secondary prevention with high intensity statin therapy. Consider restarting carvedilol aspirin warfarin, as above.  Peripheral vascular disease with gangrene of toes on both feet Patient has postsurgical pain in the right groin but notes that his feet have felt better since revascularization. CT of the pelvis today showed no evidence of abscess or hematoma in the right groin surgical bed.  Further management per vascular surgery and internal medicine.  Signed, Yvonne Kendall, MD  07/07/2016, 10:11 PM

## 2016-07-07 NOTE — Progress Notes (Signed)
END OF TRANSFUSION

## 2016-07-07 NOTE — Progress Notes (Signed)
[facility] - [place] at [facility]   PATIENT NAME: Eddie Myers    MR#:  [id number]  DATE OF BIRTH:  [birth year]  SUBJECTIVE:  CHIEF COMPLAINT:  No chief complaint on file. PatientHad a right-sided endarterectomy 5/4. Still complaining of right groin pain, for dialysis today REVIEW OF SYSTEMS:    Review of Systems  Constitutional: Positive for malaise/fatigue. Negative for chills and fever.  HENT: Negative for sore throat.   Eyes: Negative for blurred vision, double vision and pain.  Respiratory: Negative for cough, hemoptysis, shortness of breath and wheezing.   Cardiovascular: Negative for chest pain, palpitations, orthopnea and leg swelling.  Gastrointestinal: Negative for abdominal pain, constipation, diarrhea, heartburn, nausea and vomiting.  Genitourinary: Negative for dysuria and hematuria.  Musculoskeletal: Negative for back pain and joint pain.  Skin: Negative for rash.  Neurological: Positive for weakness. Negative for sensory change, speech change, focal weakness and headaches.  Endo/Heme/Allergies: Does not bruise/bleed easily.  Psychiatric/Behavioral: Negative for depression. The patient is not nervous/anxious.    DRUG ALLERGIES:  No Known Allergies  VITALS:  Blood pressure (!) 99/54, pulse 88, temperature 98.5 F (36.9 C), temperature source Oral, resp. rate 18, height 5\' 8"  (1.727 m), weight 80.7 kg (177 lb 14.6 oz), SpO2 95 %.  PHYSICAL EXAMINATION:   Physical Exam  Musculoskeletal:  Right groin area incision is erythematous and tender    GENERAL:  61 y.o.-year-old patient lying in the bed with no acute distress.  EYES: Pupils equal, round, reactive to light and accommodation. No scleral icterus. Extraocular muscles intact.  HEENT: Head atraumatic, normocephalic. Oropharynx and nasopharynx clear.  NECK:  Supple, no jugular venous distention. No thyroid enlargement, no tenderness.  LUNGS: Normal breath sounds bilaterally, no wheezing, rales,  rhonchi. No use of accessory muscles of respiration.  CARDIOVASCULAR: S1, S2 normal. No murmurs, rubs, or gallops.  ABDOMEN: Soft, nontender, nondistended. Bowel sounds present. No organomegaly or mass. Right groin area with clean incision, tender to touch, erythematous EXTREMITIES: Toes bilaterally are discolored with dry gangrene of the right second toe and left foot great toe NEUROLOGIC: Cranial nerves II through XII are intact. No focal Motor or sensory deficits b/l.   PSYCHIATRIC: The patient is alert and oriented x 3.  SKIN: No obvious rash, lesion, or ulcer.  LABORATORY PANEL:   CBC  Recent Labs Lab 07/07/16 0501  WBC 7.9  HGB 7.2*  HCT 21.1*  PLT 325   ------------------------------------------------------------------------------------------------------------------ Chemistries   Recent Labs Lab 07/07/16 0501  NA 131*  K 4.6  CL 94*  CO2 27  GLUCOSE 167*  BUN 45*  CREATININE 8.99*  CALCIUM 7.8*   ------------------------------------------------------------------------------------------------------------------  Cardiac Enzymes No results for input(s): TROPONINI in the last 168 hours. ------------------------------------------------------------------------------------------------------------------  RADIOLOGY:  No results found.   ASSESSMENT AND PLAN:  Eddie Myers  is a 61 y.o. male with a known history of End-stage renal disease on hemodialysis, diabetes, hypertension, hyperlipidemia, history of previous CVA, history of coronary artery disease status post bypass who presented to the hospital from his cardiologist's office due to bilateral lower extremity necrotic toes redness and pain.  * Acute anemia  -blood loss anemia from procedure endarterectomy,  ? Melena  Hemoglobin trended down from 7.9-6.8. Transfused 1 unit of blood 5/7. Patient's hemoglobin 8.3-7.2. We will transfuse 1 more unit of blood during hemodialysis today. Monitor serial hemoglobin and  hematocrit. Stool for occult blood is negative 1 Will repeat again. Discontinued heparin drip, Coumadin, aspirin and Plavix Vascular surgery is agreeable. Discussed  with cardiology Dr. Okey DupreEnd, agrees with the current plan   * Bilateral lower extremity cyanosis with necrotic toes --Cardiology has cleared the patient for surgery. Discussed with Dr. Lewie LoronGolan, patient's recent nuclear stress test was normal  s/p Right lower extremity angiogram -  rt common femoral endarterectomy 07/04/16 with distal angioplasty .07/03/16 -d/ced  aspirin 81 mg, Plavix 75 mg , coumdain and heparin drip in view of acute anemia  podiatry not considering any surgeries at this time. Coumadin resumed. INR at 1.9today -CT scan of the pelvis and lower extremity with postoperative changes but no acute bleed  * Fever likely due to gangrenous toes -Afebrile  - PO Augmentin to finish 7 days course.  Pain control with IV and oral pain medications Status post left lower extremity angiogram with angioplasty of left superficial femoral, popliteal and peroneal arteries.  * Subacute right sided vision loss-patient had symptoms about a month ago.  -CT head shows a age-indeterminate right sided thalamic CVA Appreciate Neurology input MRI shows old CVA  * Paroxysmal atrial fibrillation  Coumadin held secondary to acute anemia . Will follow-up with vascular surgery and podiatry  * End-stage renal disease on hemodialysis-patient gets dialysis on Tuesday Thursday Saturday Getting dialysis per nephro. Dr. Thedore MinsSingh is planned to transfuse 1 more unit of blood  tomorrow during dialysis if needed  * Hypotension with h/o hypertension-continue lisinopril, Hold carvedilol, Norvasc.  * Hypoglycemia with h/o Diabetes type 2 without complication-stop lantus, continue SSI,   * Hyperlipidemia-continue atorvastatin   -PT is recommending skilled nursing facility. Follow up with case management  Follow-up with vascular surgery, podiatry,  nephrology and cardiology [facility]   All the records are reviewed and case discussed with Care Management/Social Worker. Management plans discussed with the patient with the help of Spanish interpreter, nursing and they are in agreement.  CODE STATUS: FULL CODE  DVT Prophylaxis: SCDs  TOTAL TIME TAKING CARE OF THIS PATIENT: 35 minutes.   POSSIBLE D/C IN 2 DAYS, DEPENDING ON CLINICAL CONDITION.  Ramonita LabGouru, Jaretzy Lhommedieu M.D on 07/07/2016 at 10:22 AM  Between 7am to 6pm - Pager - [phone]  After 6pm go to [email or web address] - password [email or web address]  [facility]  Office  [phone]  CC: Primary care physician; System, Pcp Not In  Note: This dictation was prepared with Dragon dictation along with smaller phrase technology. Any transcriptional errors that result from this process are unintentional.

## 2016-07-07 NOTE — Progress Notes (Signed)
Post hd vitals 

## 2016-07-07 NOTE — Progress Notes (Signed)
HD start w/ no complications. Interpreter used. Pt has no c/o or questions. 1uprbc to be transfused with HD.

## 2016-07-07 NOTE — Progress Notes (Signed)
PRE TRANSFUSION INFO, INTERPRETER USED

## 2016-07-07 NOTE — Progress Notes (Signed)
Pt ended HD tx 2 hours early AMA. GI upset and requested to return to room. Refused bedpan. PRBC transfused with HD. Report to primary RN and Thedore MinsSingh MD notified.

## 2016-07-07 NOTE — Progress Notes (Signed)
PRE HD ASSESSMENT 

## 2016-07-07 NOTE — Progress Notes (Signed)
Pre hd info 

## 2016-07-07 NOTE — Progress Notes (Signed)
Post Op Day 4  S: still saying 9/10 pain in the groin  O: AF  VSS      Groin CD&I      Right foot warm / hyperemia        CT scan done today is reviewed by myself arterial reconstruction is patent no hematoma no abscess  A:  S/p right leg arterial reconstruction  P:  Will continue pain medication likely just post operative pain  Continue PT/OT Podiatry following

## 2016-07-08 ENCOUNTER — Telehealth: Payer: Self-pay | Admitting: Internal Medicine

## 2016-07-08 LAB — GLUCOSE, CAPILLARY
GLUCOSE-CAPILLARY: 171 mg/dL — AB (ref 65–99)
Glucose-Capillary: 175 mg/dL — ABNORMAL HIGH (ref 65–99)

## 2016-07-08 LAB — TYPE AND SCREEN
ABO/RH(D): A POS
ANTIBODY SCREEN: NEGATIVE
UNIT DIVISION: 0
Unit division: 0

## 2016-07-08 LAB — HEPATITIS B SURFACE ANTIGEN: HEP B S AG: NEGATIVE

## 2016-07-08 LAB — BPAM RBC
Blood Product Expiration Date: 201805142359
Blood Product Expiration Date: 201805152359
ISSUE DATE / TIME: 201805071426
ISSUE DATE / TIME: 201805081437
UNIT TYPE AND RH: 6200
Unit Type and Rh: 6200

## 2016-07-08 LAB — PROTIME-INR
INR: 2.32
Prothrombin Time: 25.9 seconds — ABNORMAL HIGH (ref 11.4–15.2)

## 2016-07-08 MED ORDER — INSULIN ASPART 100 UNIT/ML ~~LOC~~ SOLN: 0.0000 [IU] | Freq: Every day | SUBCUTANEOUS | 11 refills | Status: DC

## 2016-07-08 MED ORDER — WARFARIN SODIUM 5 MG PO TABS: 5.0000 mg | ORAL_TABLET | Freq: Every day | ORAL | 0 refills | Status: AC

## 2016-07-08 MED ORDER — ONDANSETRON HCL 4 MG PO TABS: 4.0000 mg | ORAL_TABLET | Freq: Four times a day (QID) | ORAL | 0 refills | Status: AC | PRN

## 2016-07-08 MED ORDER — IPRATROPIUM-ALBUTEROL 0.5-2.5 (3) MG/3ML IN SOLN: 3.0000 mL | Freq: Four times a day (QID) | RESPIRATORY_TRACT | Status: AC | PRN

## 2016-07-08 MED ORDER — NEPRO/CARBSTEADY PO LIQD: 237.0000 mL | Freq: Two times a day (BID) | ORAL | 0 refills | Status: AC

## 2016-07-08 MED ORDER — INSULIN ASPART 100 UNIT/ML ~~LOC~~ SOLN: 0.0000 [IU] | Freq: Three times a day (TID) | SUBCUTANEOUS | 11 refills | Status: DC

## 2016-07-08 MED ORDER — OXYCODONE HCL 5 MG PO TABS: 5.0000 mg | ORAL_TABLET | ORAL | 0 refills | Status: DC | PRN

## 2016-07-08 MED ORDER — ACETAMINOPHEN 325 MG PO TABS: 650.0000 mg | ORAL_TABLET | Freq: Four times a day (QID) | ORAL | Status: AC | PRN

## 2016-07-08 MED ORDER — POLYETHYLENE GLYCOL 3350 17 G PO PACK: 17.0000 g | PACK | Freq: Every day | ORAL | 0 refills | Status: AC | PRN

## 2016-07-08 MED ORDER — ASPIRIN EC 81 MG PO TBEC: 81.0000 mg | DELAYED_RELEASE_TABLET | Freq: Every day | ORAL | Status: AC

## 2016-07-08 MED ORDER — SENNOSIDES-DOCUSATE SODIUM 8.6-50 MG PO TABS: 1.0000 | ORAL_TABLET | Freq: Every evening | ORAL | Status: AC | PRN

## 2016-07-08 NOTE — Care Management (Signed)
Patient to discharge to SNF today.  Copy of Im provided to patient.  Eddie Myers HD liaison notified of discharge.

## 2016-07-08 NOTE — Telephone Encounter (Signed)
TCM... Pt is being discharged today and saw Dr End in hospital  He is coming on 07/22/16 to see Ward Givenshris Berge

## 2016-07-08 NOTE — Telephone Encounter (Signed)
Patient currently admitted at this time. 

## 2016-07-08 NOTE — Clinical Social Work Note (Signed)
CSW has extended bed offers with the assistance of the hospital interpreter. Patient and family have chosen [facility]. [facility] is aware. Nurse to call report. Discharge information to be sent. York SpanielMonica Jillian Warth MSW,LcSW [phone]

## 2016-07-08 NOTE — [place] FL2 (Signed)
[place] MEDICAID FL2 LEVEL OF CARE SCREENING TOOL     IDENTIFICATION  Patient Name: Eddie Myers Birthdate: [birth year] Sex: male Admission Date (Current Location): 06/24/2016  [place] and [place] Number:  Chiropodist and Address:  [facility], [address], [place], [place] [address]      Provider Number: [id number]  Attending Physician Name and Address:  Ramonita Lab, MD  Relative Name and Phone Number:       Current Level of Care: Hospital Recommended Level of Care: Skilled Nursing Facility Prior Approval Number:    Date Approved/Denied:   PASRR Number:    Discharge Plan: SNF    Current Diagnoses: Patient Active Problem List   Diagnosis Date Noted  . Gangrene (HCC) 06/24/2016  . Vision loss of right eye 06/24/2016  . Gangrene of lower extremity (HCC) 06/24/2016  . Encounter for therapeutic drug monitoring 03/25/2016  . Paroxysmal atrial fibrillation (HCC) 03/07/2016  . Renal dialysis device, implant, or graft complication 01/22/2016  . ESRD on dialysis (HCC) 01/06/2016  . Type 2 diabetes mellitus with complication (HCC) 01/06/2016  . Anginal pain (HCC) 11/20/2015  . Hypertension 11/20/2015  . Coronary artery disease involving native coronary artery of native heart without angina pectoris 11/20/2015  . Cardiomyopathy, ischemic 11/20/2015  . Stroke (HCC) 11/20/2015    Orientation RESPIRATION BLADDER Height & Weight     Self, Time, Situation, Place  Normal Continent Weight: 177 lb 14.6 oz (80.7 kg) Height:  5\' 8"  (172.7 cm)  BEHAVIORAL SYMPTOMS/MOOD NEUROLOGICAL BOWEL NUTRITION STATUS   (none)  (none) Continent Diet (renal)  AMBULATORY STATUS COMMUNICATION OF NEEDS Skin   Limited Assist Verbally Normal                       Personal Care Assistance Level of Assistance  Bathing, Dressing Bathing Assistance: Limited assistance   Dressing Assistance: Limited assistance     Functional Limitations Info   (no  issues)          SPECIAL CARE FACTORS FREQUENCY  PT (By licensed PT)                    Contractures Contractures Info: Not present    Additional Factors Info  Code Status, Allergies Code Status Info: full Allergies Info: nka           Current Medications (07/08/2016):  This is the current hospital active medication list Current Facility-Administered Medications  Medication Dose Route Frequency Provider Last Rate Last Dose  . 0.9 %  sodium chloride infusion   Intravenous Once Makendra Vigeant, MD      . acetaminophen (TYLENOL) tablet 650 mg  650 mg Oral Q6H PRN Houston Siren, MD   650 mg at 06/28/16 0403   Or  . acetaminophen (TYLENOL) suppository 650 mg  650 mg Rectal Q6H PRN Houston Siren, MD      . amoxicillin-clavulanate (AUGMENTIN) 500-125 MG per tablet 500 mg  1 tablet Oral Q24H Sherryll Burger, Vipul, MD   500 mg at 07/07/16 1210  . atorvastatin (LIPITOR) tablet 80 mg  80 mg Oral QPM Houston Siren, MD   80 mg at 07/07/16 1733  . benzonatate (TESSALON) capsule 200 mg  200 mg Oral BID PRN Delfino Lovett, MD   200 mg at 06/29/16 1716  . bisacodyl (DULCOLAX) suppository 10 mg  10 mg Rectal Daily PRN Lamont Dowdy, MD   10 mg at 07/07/16 0925  . epoetin alfa (EPOGEN,PROCRIT) injection  10,000 Units  10,000 Units Intravenous Q T,Th,Sa-HD Lamont DowdyKolluru, Sarath, MD   10,000 Units at 07/07/16 1549  . feeding supplement (NEPRO CARB STEADY) liquid 237 mL  237 mL Oral BID BM Kennady Zimmerle, MD   237 mL at 07/07/16 0925  . ferric citrate (AURYXIA) tablet 420 mg  420 mg Oral TID WC Sudini, Wardell HeathSrikar, MD   420 mg at 07/08/16 0811  . heparin injection 1,000 Units  1,000 Units Dialysis PRN Lateef, Munsoor, MD      . insulin aspart (novoLOG) injection 0-5 Units  0-5 Units Subcutaneous QHS Houston SirenSainani, Vivek J, MD   2 Units at 07/06/16 2216  . insulin aspart (novoLOG) injection 0-9 Units  0-9 Units Subcutaneous TID WC Houston SirenSainani, Vivek J, MD   2 Units at 07/08/16 [phone]  . ipratropium-albuterol (DUONEB) 0.5-2.5  (3) MG/3ML nebulizer solution 3 mL  3 mL Nebulization Q6H PRN Suda Forbess, MD      . lidocaine (PF) (XYLOCAINE) 1 % injection 5 mL  5 mL Intradermal PRN Lateef, Munsoor, MD      . lidocaine-prilocaine (EMLA) cream 1 application  1 application Topical PRN Lateef, Munsoor, MD      . lisinopril (PRINIVIL,ZESTRIL) tablet 2.5 mg  2.5 mg Oral Daily Houston SirenSainani, Vivek J, MD   Stopped at 07/07/16 1000  . living well with diabetes book MISC   Does not apply Once Milagros LollSudini, Srikar, MD   Stopped at 06/25/16 1317  . Melatonin TABS 5 mg  5 mg Oral QHS Houston SirenSainani, Vivek J, MD   5 mg at 07/07/16 2120  . morphine 2 MG/ML injection 2 mg  2 mg Intravenous Q4H PRN Venise Ellingwood, MD   2 mg at 07/07/16 1753  . nitroGLYCERIN (NITROSTAT) SL tablet 0.4 mg  0.4 mg Sublingual Q5 min PRN Houston SirenSainani, Vivek J, MD      . ondansetron (ZOFRAN) tablet 4 mg  4 mg Oral Q6H PRN Houston SirenSainani, Vivek J, MD       Or  . ondansetron (ZOFRAN) injection 4 mg  4 mg Intravenous Q6H PRN Houston SirenSainani, Vivek J, MD      . ondansetron [facility](ZOFRAN) injection 4 mg  4 mg Intravenous Q6H PRN Stegmayer, Kimberly A, PA-C   4 mg at 07/05/16 1236  . oxyCODONE (Oxy IR/ROXICODONE) immediate release tablet 10 mg  10 mg Oral Q4H PRN Rolm BaptiseGilliam, Holly N, RPH   10 mg at 07/08/16 0536  . oxyCODONE (Oxy IR/ROXICODONE) immediate release tablet 5 mg  5 mg Oral Q4H PRN Matheu Ploeger, MD   5 mg at 07/06/16 2035  . pentafluoroprop-tetrafluoroeth (GEBAUERS) aerosol 1 application  1 application Topical PRN Lateef, Munsoor, MD      . phenol (CHLORASEPTIC) mouth spray 1 spray  1 spray Mouth/Throat PRN Oralia ManisWillis, David, MD      . phenol-menthol (CEPASTAT) lozenge 1 lozenge  1 lozenge Buccal PRN Oralia ManisWillis, David, MD   1 lozenge at 07/03/16 2332  . pneumococcal 23 valent vaccine (PNU-IMMUNE) injection 0.5 mL  0.5 mL Intramuscular Tomorrow-1000 Sainani, Vivek J, MD      . polyethylene glycol (MIRALAX / GLYCOLAX) packet 17 g  17 g Oral Daily PRN Kolluru, Sarath, MD   17 g at 07/02/16 1538  . polyethylene glycol  (MIRALAX / GLYCOLAX) packet 17 g  17 g Oral Daily Jaeli Grubb, MD   17 g at 07/07/16 0925  . senna-docusate (Senokot-S) tablet 1 tablet  1 tablet Oral Daily Weslee Prestage, MD   1 tablet at 07/07/16 0836  . tamsulosin (FLOMAX) capsule  0.4 mg  0.4 mg Oral QHS Houston Siren, MD   0.4 mg at 07/07/16 2120     Discharge Medications: Please see discharge summary for a list of discharge medications.  Relevant Imaging Results:  Relevant Lab Results:   Additional Information ss: [id number]  York Spaniel, LCSW

## 2016-07-08 NOTE — Progress Notes (Signed)
Pt A and O x 4. VSS. Pt tolerating diet well. No complaints of pain or nausea. IV removed intact, prescriptions given. Pt voiced understanding of discharge instructions with no further questions. Interpreter used for discharge instructions. Report called to [facility]. Pt discharge via wheelchair with axillary. Pt's daughter to transport him to facility.

## 2016-07-08 NOTE — Discharge Summary (Signed)
[facility] - [place] at [facility]   PATIENT NAME: Eddie Myers    MR#:  [id number]  DATE OF BIRTH:  1955-09-27  DATE OF ADMISSION:  06/24/2016 ADMITTING PHYSICIAN: Houston Siren, MD  DATE OF DISCHARGE: 07/08/16 PRIMARY CARE PHYSICIAN: System, Pcp Not In    ADMISSION DIAGNOSIS:  bilateral lower extremity gangrene atherosclerosis with gangrene atheroscolerosis with gangrene Femoral endarterectomy and distal angioplasty   DR DEW TO ASSIST  DISCHARGE DIAGNOSIS:  Active Problems:   Gangrene of lower extremity (HCC)   SECONDARY DIAGNOSIS:   Past Medical History:  Diagnosis Date  . Anginal pain (HCC)   . Coronary artery disease   . Diabetes mellitus without complication (HCC)   . Dialysis patient (HCC)    Tues, Thurs, Sat  . Dyspnea    with exertion  . Elevated lipids   . Enlarged prostate   . ESRD (end stage renal disease) (HCC)    dialysis M-W-F  . Hypertension   . Myocardial infarction (HCC) 08/2015  . Renal insufficiency   . Stroke [facility])    Lt side this year July    HOSPITAL COURSE:  HPI  Eddie Myers  is a 61 y.o. male with a known history of End-stage renal disease on hemodialysis, diabetes, hypertension, hyperlipidemia, history of previous CVA, history of coronary artery disease status post bypass who presented to the hospital from his cardiologist's office due to bilateral lower extremity necrotic toes redness and pain. Patient says that he has noticed over the past 2 weeks that his left lower extremity has become more painful with some of his toes having significant discoloration. He also noticed that his right second toe also has significant discoloration and pain in it. He went to see his cardiologist for follow-up and they contacted hospitalist service for direct admission. Patient also says about a month ago he lost vision in his right eye but did not seek any help. He has some right-sided peripheral vision.  He denies any headache,  nausea, vomiting, chest pain, shortness of breath, fever, chills or any other associated symptoms presently.  * Acute anemia  -blood loss anemia from procedure endarterectomy, Stool for Hemoccult is negative Hemoglobin trended down from 7.9-6.8. Transfused 1 unit of blood 5/7. Patient's hemoglobin 8.3-7.2.  patient had 1 more unit of blood transfusion during hemodialysis yesterday .  hemoglobin is at 9.5 today . Discontinued heparin drip.resume Coumadin and aspirin 81 mg enteric-coated. Discontinue Plavix as per vascular recommendations  Follow-up with vascular and cardiology [facility]  as an outpatient   * Bilateral lower extremity cyanosis with necrotic toes --Cardiology has cleared the patient for surgery. Discussed with Dr. Lewie Loron, patient's recent nuclear stress test was normal  s/p Right lower extremity angiogram -  rt common femoral endarterectomy 07/04/16 with distal angioplasty .07/03/16  podiatry not considering any surgeries at this time. Coumadin resumed. INR at 2.32 today -CT scan of the pelvis and lower extremity with postoperative changes but no acute bleed -resume Coumadin and aspirin 81 mg enteric-coated per vascular recommnedations  * Fever likely due to gangrenous toes -Afebrile  - PO Augmentin finished 7 days course total. Discontinue antibiotics  Pain control with IV and oral pain medications Status post left lower extremity angiogram with angioplasty of left superficial femoral, popliteal and peroneal arteries.  * Subacute right sided vision loss-patient had symptoms about a month ago.  -CT head shows a age-indeterminate right sided thalamic CVA Appreciate Neurology input MRI shows old CVA  * Paroxysmal atrial fibrillation  Resume Coumadin at 5 mg, repeat PT/INR on May 10, further Coumadin management by nursing home physician  * End-stage renal disease on hemodialysis-patient gets dialysis on Tuesday Thursday Saturday   * Hypotension with h/o hypertension-continue  lisinopril, Hold carvedilol, Norvasc.  * Hypoglycemia with h/o Diabetes type 2 without complication-stop lantus, continue SSI,   * Hyperlipidemia-continue atorvastatin   -PT is recommending skilled nursing facility. Pt  Agreeable , d/c  snf today  Follow-up with vascular surgery, podiatry, nephrology and cardiology [facility]    DISCHARGE CONDITIONS:   FAIR  CONSULTS OBTAINED:  Treatment Team:  Renford DillsSchnier, Gregory G, MD Mady HaagensenLateef, Munsoor, MD Kym Groomriadhosp, Neuro1, MD Linus Galasline, Todd, DPM Regan Lemmingamnitz, Will Martin, MD   PROCEDURES  rt common femoral endarterectomy 07/04/16 with distal angioplasty .07/03/16  DRUG ALLERGIES:  No Known Allergies  DISCHARGE MEDICATIONS:   Current Discharge Medication List    START taking these medications   Details  acetaminophen (TYLENOL) 325 MG tablet Take 2 tablets (650 mg total) by mouth every 6 (six) hours as needed for mild pain (or Fever >/= 101).    aspirin EC 81 MG tablet Take 1 tablet (81 mg total) by mouth daily.    !! insulin aspart (NOVOLOG) 100 UNIT/ML injection Inject 0-9 Units into the skin 3 (three) times daily with meals. Qty: 10 mL, Refills: 11    !! insulin aspart (NOVOLOG) 100 UNIT/ML injection Inject 0-5 Units into the skin at bedtime. Qty: 10 mL, Refills: 11    ipratropium-albuterol (DUONEB) 0.5-2.5 (3) MG/3ML SOLN Take 3 mLs by nebulization every 6 (six) hours as needed. Qty: 360 mL    Nutritional Supplements (FEEDING SUPPLEMENT, NEPRO CARB STEADY,) LIQD Take 237 mLs by mouth 2 (two) times daily between meals. Qty: 60 Can, Refills: 0    ondansetron (ZOFRAN) 4 MG tablet Take 1 tablet (4 mg total) by mouth every 6 (six) hours as needed for nausea. Qty: 20 tablet, Refills: 0    oxyCODONE (OXY IR/ROXICODONE) 5 MG immediate release tablet Take 1 tablet (5 mg total) by mouth every 4 (four) hours as needed for moderate pain or breakthrough pain (mild pain). Qty: 30 tablet, Refills: 0    polyethylene glycol (MIRALAX / GLYCOLAX)  packet Take 17 g by mouth daily as needed for moderate constipation. Qty: 14 each, Refills: 0    senna-docusate (SENOKOT-S) 8.6-50 MG tablet Take 1 tablet by mouth at bedtime as needed for mild constipation.     !! - Potential duplicate medications found. Please discuss with provider.    CONTINUE these medications which have CHANGED   Details  warfarin (COUMADIN) 5 MG tablet Take 1 tablet (5 mg total) by mouth daily at 6 PM. 5 mg once daily for 2 days. Repeat PT/INR on May 10 further management of Coumadin by the nursing home physician Qty: 2 tablet, Refills: 0      CONTINUE these medications which have NOT CHANGED   Details  atorvastatin (LIPITOR) 80 MG tablet Take 80 mg by mouth every evening.     ferric citrate (AURYXIA) 1 GM 210 MG(Fe) tablet Take 420 mg by mouth 3 (three) times daily with meals.    glipiZIDE (GLUCOTROL) 5 MG tablet Take 5 mg by mouth 2 (two) times daily.     insulin regular (NOVOLIN R,HUMULIN R) 100 units/mL injection Inject 0-10 Units into the skin 3 (three) times daily before meals.     lisinopril (PRINIVIL,ZESTRIL) 2.5 MG tablet Take 2.5 mg by mouth daily.    Melatonin 3 MG TABS Take  3 mg by mouth at bedtime.     nitroGLYCERIN (NITROSTAT) 0.4 MG SL tablet Place 0.4 mg under the tongue every 5 (five) minutes as needed for chest pain.    tamsulosin (FLOMAX) 0.4 MG CAPS capsule Take 0.4 mg by mouth at bedtime.      STOP taking these medications     amLODipine (NORVASC) 2.5 MG tablet      carvedilol (COREG) 25 MG tablet      insulin glargine (LANTUS) 100 UNIT/ML injection          DISCHARGE INSTRUCTIONS:  Follow-up with primary care physician at the facility in 2 days with PT/INR on May 10 for the management of the Coumadin by primary care physician at the facility Follow up with  vascular surgery, podiatry, cardiology as recommended Continue hemodialysis on Tuesday, Thursday and Saturday Follow-up with nephrology in 2-3 days   DIET:  Cardiac  diet and Diabetic diet  DISCHARGE CONDITION:  Fair  ACTIVITY:  Activity as tolerated per PT   OXYGEN:  Home Oxygen: No.   Oxygen Delivery: room air  DISCHARGE LOCATION:  nursing home   If you experience worsening of your admission symptoms, develop shortness of breath, life threatening emergency, suicidal or homicidal thoughts you must seek medical attention immediately by calling 911 or calling your MD immediately  if symptoms less severe.  You Must read complete instructions/literature along with all the possible adverse reactions/side effects for all the Medicines you take and that have been prescribed to you. Take any new Medicines after you have completely understood and accpet all the possible adverse reactions/side effects.   Please note  You were cared for by a hospitalist during your hospital stay. If you have any questions about your discharge medications or the care you received while you were in the hospital after you are discharged, you can call the unit and asked to speak with the hospitalist on call if the hospitalist that took care of you is not available. Once you are discharged, your primary care physician will handle any further medical issues. Please note that NO REFILLS for any discharge medications will be authorized once you are discharged, as it is imperative that you return to your primary care physician (or establish a relationship with a primary care physician if you do not have one) for your aftercare needs so that they can reassess your need for medications and monitor your lab values.     Today  No chief complaint on file.  Patient is doing better. Pain in the right groin area improved. Agreeable to go to rehabilitation center  ROS:  CONSTITUTIONAL: Denies fevers, chills. Denies any fatigue, weakness.  EYES: Denies blurry vision, double vision, eye pain. EARS, NOSE, THROAT: Denies tinnitus, ear pain, hearing loss. RESPIRATORY: Denies cough, wheeze,  shortness of breath.  CARDIOVASCULAR: Denies chest pain, palpitations, edema.  GASTROINTESTINAL: Denies nausea, vomiting, diarrhea, abdominal pain. Denies bright red blood per rectum. GENITOURINARY: Denies dysuria, hematuria. ENDOCRINE: Denies nocturia or thyroid problems. HEMATOLOGIC AND LYMPHATIC: Denies easy bruising or bleeding. SKIN: Denies rash or lesion. MUSCULOSKELETAL: Denies pain in neck, back, shoulder, knees, hips or arthritic symptoms.  NEUROLOGIC: Denies paralysis, paresthesias.  PSYCHIATRIC: Denies anxiety or depressive symptoms.   VITAL SIGNS:  Blood pressure 113/60, pulse 77, temperature 97.9 F (36.6 C), temperature source Oral, resp. rate 16, height 5\' 8"  (1.727 m), weight 80.7 kg (177 lb 14.6 oz), SpO2 95 %.  I/O:    Intake/Output Summary (Last 24 hours) at 07/08/16 1247 Last  data filed at 07/08/16 1148  Gross per 24 hour  Intake             1120 ml  Output              180 ml  Net              940 ml    PHYSICAL EXAMINATION:  GENERAL:  61 y.o.-year-old patient lying in the bed with no acute distress.  EYES: Pupils equal, round, reactive to light and accommodation. No scleral icterus. Extraocular muscles intact.  HEENT: Head atraumatic, normocephalic. Oropharynx and nasopharynx clear.  NECK:  Supple, no jugular venous distention. No thyroid enlargement, no tenderness.  LUNGS: Normal breath sounds bilaterally, no wheezing, rales,rhonchi or crepitation. No use of accessory muscles of respiration.  CARDIOVASCULAR: S1, S2 normal. No murmurs, rubs, or gallops.  ABDOMEN: Soft, non-tender, non-distended. Bowel sounds present. No organomegaly or mass.  EXTREMITIES: Toes bilaterally are discolored with dry gangrene of the right second toe and left foot great toeRight groin area clean incision healing well No pedal edema, cyanosis, or clubbing.  NEUROLOGIC: Cranial nerves II through XII are intact. Muscle strength 5/5 in all extremities. Sensation intact. Gait not  checked.  PSYCHIATRIC: The patient is alert and oriented x 3.  SKIN: No obvious rash, lesion, or ulcer.   DATA REVIEW:   CBC  Recent Labs Lab 07/07/16 0501 07/07/16 1708  WBC 7.9  --   HGB 7.2* 9.5*  HCT 21.1* 28.1*  PLT 325  --     Chemistries   Recent Labs Lab 07/07/16 0501  NA 131*  K 4.6  CL 94*  CO2 27  GLUCOSE 167*  BUN 45*  CREATININE 8.99*  CALCIUM 7.8*    Cardiac Enzymes No results for input(s): TROPONINI in the last 168 hours.  Microbiology Results  Results for orders placed or performed during the hospital encounter of 06/24/16  MRSA PCR Screening     Status: None   Collection Time: 06/24/16  5:05 AM  Result Value Ref Range Status   MRSA by PCR NEGATIVE NEGATIVE Final    Comment:        The GeneXpert MRSA Assay (FDA approved for NASAL specimens only), is one component of a comprehensive MRSA colonization surveillance program. It is not intended to diagnose MRSA infection nor to guide or monitor treatment for MRSA infections.   CULTURE, BLOOD (ROUTINE X 2) w Reflex to ID Panel     Status: None   Collection Time: 06/27/16  7:22 AM  Result Value Ref Range Status   Specimen Description BLOOD RIGHT HAND  Final   Special Requests   Final    BOTTLES DRAWN AEROBIC AND ANAEROBIC Blood Culture adequate volume   Culture NO GROWTH 5 DAYS  Final   Report Status 07/02/2016 FINAL  Final  CULTURE, BLOOD (ROUTINE X 2) w Reflex to ID Panel     Status: None   Collection Time: 06/27/16  7:29 AM  Result Value Ref Range Status   Specimen Description BLOOD RIGHT ASSIST CONTROL  Final   Special Requests   Final    BOTTLES DRAWN AEROBIC AND ANAEROBIC Blood Culture adequate volume   Culture NO GROWTH 5 DAYS  Final   Report Status 07/02/2016 FINAL  Final    RADIOLOGY:  Ct Pelvis W Contrast  Result Date: 07/07/2016 CLINICAL DATA:  Severe right groin pain. Drop in hematocrit and hemoglobin. Status post right common femoral, profunda femoris and superficial  femoral artery endarterectomies  with separate core matrix patch angioplasty of the common and superficial femoral arteries and second patchy angioplasty of the profunda femoris artery on 07/03/2016. Additional angioplasties of the right popliteal and right posterior tibial arteries. EXAM: CT PELVIS AND BILATERAL LOWER EXTREMITIES WITH CONTRAST TECHNIQUE: Multidetector CT imaging of the pelvis and bilateral lower extremities was performed according to the standard protocol following bolus administration of intravenous contrast. Multiplanar CT image reconstructions were also generated. CONTRAST:  ISOVUE-300 IOPAMIDOL (ISOVUE-300) INJECTION 61% COMPARISON:  None. FINDINGS: CT PELVIS FINDINGS Urinary Tract:  Unremarkable urinary bladder and distal ureters. Bowel:  Unremarkable. Vascular/Lymphatic: Atheromatous arterial calcifications and plaques. Reproductive:  Normal sized prostate gland. Other: Small bilateral inguinal hernias containing fat. Mild right groin. Vascular and subcutaneous edema and postoperative air, extending superiorly and inferiorly. No discrete abscess or hematoma seen. Musculoskeletal: Mild lower lumbar spine degenerative changes. Left iliac bone cyst. CT LEFT/RIGHT LOWER EXTREMITY FINDINGS Bones/Joint/Cartilage Unremarkable. Ligaments Suboptimally assessed by CT. Muscles and Tendons Mild low density in the lateral aspect of the proximal adductor longus muscle on the right. No significant enlargement of the masses Soft tissues Mild diffuse subcutaneous edema anteriorly, laterally and medially in the proximal right thigh. Vascular: Multifocal arterial atheromatous calcifications and plaques. IMPRESSION: 1. Right groin postoperative changes with mild associated soft tissue edema and air, including mild edema in the lateral aspect of the proximal adductor longus muscle on the right. No visible hematoma. 2. Extensive bilateral arterial atheromatous changes. 3. Small bilateral inguinal hernias  containing fat. Electronically Signed   By: Beckie Salts M.D.   On: 07/07/2016 13:28   Ct Extrem Lower W Cm Bil  Result Date: 07/07/2016 CLINICAL DATA:  Severe right groin pain. Drop in hematocrit and hemoglobin. Status post right common femoral, profunda femoris and superficial femoral artery endarterectomies with separate core matrix patch angioplasty of the common and superficial femoral arteries and second patchy angioplasty of the profunda femoris artery on 07/03/2016. Additional angioplasties of the right popliteal and right posterior tibial arteries. EXAM: CT PELVIS AND BILATERAL LOWER EXTREMITIES WITH CONTRAST TECHNIQUE: Multidetector CT imaging of the pelvis and bilateral lower extremities was performed according to the standard protocol following bolus administration of intravenous contrast. Multiplanar CT image reconstructions were also generated. CONTRAST:  ISOVUE-300 IOPAMIDOL (ISOVUE-300) INJECTION 61% COMPARISON:  None. FINDINGS: CT PELVIS FINDINGS Urinary Tract:  Unremarkable urinary bladder and distal ureters. Bowel:  Unremarkable. Vascular/Lymphatic: Atheromatous arterial calcifications and plaques. Reproductive:  Normal sized prostate gland. Other: Small bilateral inguinal hernias containing fat. Mild right groin. Vascular and subcutaneous edema and postoperative air, extending superiorly and inferiorly. No discrete abscess or hematoma seen. Musculoskeletal: Mild lower lumbar spine degenerative changes. Left iliac bone cyst. CT LEFT/RIGHT LOWER EXTREMITY FINDINGS Bones/Joint/Cartilage Unremarkable. Ligaments Suboptimally assessed by CT. Muscles and Tendons Mild low density in the lateral aspect of the proximal adductor longus muscle on the right. No significant enlargement of the masses Soft tissues Mild diffuse subcutaneous edema anteriorly, laterally and medially in the proximal right thigh. Vascular: Multifocal arterial atheromatous calcifications and plaques. IMPRESSION: 1. Right  groin postoperative changes with mild associated soft tissue edema and air, including mild edema in the lateral aspect of the proximal adductor longus muscle on the right. No visible hematoma. 2. Extensive bilateral arterial atheromatous changes. 3. Small bilateral inguinal hernias containing fat. Electronically Signed   By: Beckie Salts M.D.   On: 07/07/2016 13:28    EKG:   Orders placed or performed in visit on 06/24/16  . EKG 12-Lead  Management plans discussed with the patient, family and they are in agreement.  CODE STATUS:     Code Status Orders        Start     Ordered   06/24/16 1439  Full code  Continuous     06/24/16 1441    Code Status History    Date Active Date Inactive Code Status Order ID Comments User Context   01/28/2016 11:52 AM 01/28/2016  4:07 PM Full Code [id number]  Schnier, Latina Craver, MD Inpatient      TOTAL TIME TAKING CARE OF THIS PATIENT: 45  minutes.   Note: This dictation was prepared with Dragon dictation along with smaller phrase technology. Any transcriptional errors that result from this process are unintentional.   @[facility] @  on 07/08/2016 at 12:47 PM  Between 7am to 6pm - Pager - [phone]  After 6pm go to [email or web address] - password [email or web address]  [facility]  Office  [phone]  CC: Primary care physician; System, Pcp Not In

## 2016-07-08 NOTE — Clinical Social Work Placement (Signed)
   CLINICAL SOCIAL WORK PLACEMENT  NOTE  Date:  07/08/2016  Patient Details  Name: Eddie DraftsMauro Vercher MRN: [id number] Date of Birth: [birth year]  Clinical Social Work is seeking post-discharge placement for this patient at the Skilled  Nursing Facility level of care (*CSW will initial, date and re-position this form in  chart as items are completed):  Yes   Patient/family provided with [facility] Clinical Social Work Department's list of facilities offering this level of care within the geographic area requested by the patient (or if unable, by the patient's family).  Yes   Patient/family informed of their freedom to choose among providers that offer the needed level of care, that participate in Medicare, Medicaid or managed care program needed by the patient, have an available bed and are willing to accept the patient.  Yes   Patient/family informed of [facility] ownership interest [facility] and [facility], as well as of the fact that they are under no obligation to receive care at these facilities.  PASRR submitted to EDS on 07/06/16     PASRR number received on 07/06/16     Existing PASRR number confirmed on       FL2 transmitted to all facilities in geographic area requested by pt/family on       FL2 transmitted to all facilities within larger geographic area on       Patient informed that his/her managed care company has contracts with or will negotiate with certain facilities, including the following:        Yes   Patient/family informed of bed offers received.  Patient chooses bed at  [facility])     Physician recommends and patient chooses bed at  [facility](SNF)    Patient to be transferred to  ([facility]) on 07/08/16.  Patient to be transferred to facility by  (Family member)     Patient family notified on 07/08/16 of transfer.  Name of family member notified:   (daughter)     PHYSICIAN       Additional Comment:     _______________________________________________ York SpanielMonica Zemira Zehring, LCSW 07/08/2016, 12:06 PM

## 2016-07-08 NOTE — Procedures (Signed)
Called report, Abby, RN states that pt is getting d/c before 1500 today D/T transportation available by this time, also, states that Nephrology aware and will d/c hd orders.

## 2016-07-08 NOTE — Clinical Social Work Note (Signed)
Clinical Social Work Assessment  Patient Details  Name: Eddie Myers MRN: [id number] Date of Birth: 1955/05/12  Date of referral:  07/08/16               Reason for consult:  Facility Placement                Permission sought to share information with:  Facility Medical sales representativeContact Representative, Family Supports Permission granted to share information::  Yes, Verbal Permission Granted  Name::        Agency::     Relationship::     Contact Information:     Housing/Transportation Living arrangements for the past 2 months:  Skilled Building surveyorursing Facility Source of Information:  Patient Patient Interpreter Needed:  None Criminal Activity/Legal Involvement Pertinent to Current Situation/Hospitalization:  No - Comment as needed Significant Relationships:  Adult Children Lives with:  Adult Children Do you feel safe going back to the place where you live?  Yes Need for family participation in patient care:  Yes (Comment)  Care giving concerns:  Patient resides at home with his son who works during the day.   Social Worker assessment / plan:  CSW spoke with patient with a hospital interpreter and informed him of the PT recommendations for STR. CSW explained what STR rehab entails and what type of environment STR is provided. Patient is agreeable to rehab. Patient states his family member will transport him. Bed search initated and will await bed offers.  Employment status:  Retired Health and safety inspectornsurance information:  Medicare PT Recommendations:  Skilled Nursing Facility Information / Referral to community resources:     Patient/Family's Response to care:  Patient expressed appreciation for CSW visit.  Patient/Family's Understanding of and Emotional Response to Diagnosis, Current Treatment, and Prognosis:  Patient verbalized understanding of the STR process and stated he is in agreement to go until he becomes stronger.  Emotional Assessment Appearance:  Appears stated age Attitude/Demeanor/Rapport:   (pleasant  and cooperative) Affect (typically observed):  Accepting, Adaptable, Calm, Pleasant, Appropriate Orientation:  Oriented to Self, Oriented to Place, Oriented to  Time, Oriented to Situation Alcohol / Substance use:  Not Applicable Psych involvement (Current and /or in the community):  No (Comment)  Discharge Needs  Concerns to be addressed:  Care Coordination Readmission within the last 30 days:  No Current discharge risk:  None Barriers to Discharge:  No Barriers Identified   York SpanielMonica Jakiya Bookbinder, LCSW 07/08/2016, 11:19 AM

## 2016-07-08 NOTE — Progress Notes (Signed)
[facility]  ROUNDING NOTE   Subjective:    conversation through [place] interpreter He feels well today No shortness of breath Able to read without nausea or vomiting He cut his dialysis treatment short yesterday because of need to go to bathroom Asking about completing the treatment today   Objective:  Vital signs in last 24 hours:  Temp:  [97.7 F (36.5 C)-98.2 F (36.8 C)] 97.9 F (36.6 C) (05/09 1213) Pulse Rate:  [38-96] 77 (05/09 1213) Resp:  [10-18] 16 (05/09 0538) BP: (99-147)/(47-86) 113/60 (05/09 1232) SpO2:  [90 %-100 %] 95 % (05/09 1213)  Weight change:  Filed Weights   07/02/16 1330 07/03/16 0715 07/04/16 1050  Weight: 79.3 kg (174 lb 13.2 oz) 80 kg (176 lb 5.9 oz) 80.7 kg (177 lb 14.6 oz)    Intake/Output: I/O last 3 completed shifts: In: 1360 [P.O.:720; Blood:640] Out: 230 [Urine:50; Other:180]   Intake/Output this shift:  Total I/O In: 480 [P.O.:480] Out: 0   Physical Exam: General: No acute distress  Head: Normocephalic, atraumatic. Moist oral mucosal membranes  Eyes: Anicteric  Neck: Supple, trachea midline  Lungs:  Clear to auscultation, normal effort  Heart: S1S2 no rubs  Abdomen:  Soft, nontender,    Extremities: No peripheral edema. Gangrene left 1st, 2nd and 3rd toe, right 2nd toe  Neurologic: Nonfocal, moving all four extremities  Skin: No lesions  Access: LUE AVF    Basic Metabolic Panel:  Recent Labs Lab 07/04/16 0505 07/06/16 0016 07/07/16 0501  NA 132* 132* 131*  K 5.3* 4.5 4.6  CL 100* 97* 94*  CO2 24 27 27   GLUCOSE 154* 176* 167*  BUN 33* 31* 45*  CREATININE 7.83* 7.07* 8.99*  CALCIUM 7.7* 7.7* 7.8*    Liver Function Tests: No results for input(s): AST, ALT, ALKPHOS, BILITOT, PROT, ALBUMIN in the last 168 hours. No results for input(s): LIPASE, AMYLASE in the last 168 hours. No results for input(s): AMMONIA in the last 168 hours.  CBC:  Recent Labs Lab 07/02/16 1759 07/03/16 0519  07/03/16 1438 07/04/16 0505 07/06/16 0016 07/06/16 1931 07/07/16 0501 07/07/16 1708  WBC 8.1 8.1 8.7 9.2 9.1  --  7.9  --   NEUTROABS 6.8*  --  5.9  --   --   --   --   --   HGB 8.6* 7.8* 8.3* 7.9* 6.8* 8.3* 7.2* 9.5*  HCT 26.3* 23.3* 25.4* 24.0* 20.7* 24.7* 21.1* 28.1*  MCV 89.3 88.1 88.6 87.7 90.2  --  88.1  --   PLT 240 227 241 255 284  --  325  --     Cardiac Enzymes: No results for input(s): CKTOTAL, CKMB, CKMBINDEX, TROPONINI in the last 168 hours.  BNP: Invalid input(s): POCBNP  CBG:  Recent Labs Lab 07/07/16 1150 07/07/16 1705 07/07/16 2109 07/08/16 0725 07/08/16 1128  GLUCAP 191* 149* 173* 171* 175*    Microbiology: Results for orders placed or performed during the hospital encounter of 06/24/16  MRSA PCR Screening     Status: None   Collection Time: 06/24/16  5:05 AM  Result Value Ref Range Status   MRSA by PCR NEGATIVE NEGATIVE Final    Comment:        The GeneXpert MRSA Assay (FDA approved for NASAL specimens only), is one component of a comprehensive MRSA colonization surveillance program. It is not intended to diagnose MRSA infection nor to guide or monitor treatment for MRSA infections.   CULTURE, BLOOD (ROUTINE X 2) w Reflex to ID  Panel     Status: None   Collection Time: 06/27/16  7:22 AM  Result Value Ref Range Status   Specimen Description BLOOD RIGHT HAND  Final   Special Requests   Final    BOTTLES DRAWN AEROBIC AND ANAEROBIC Blood Culture adequate volume   Culture NO GROWTH 5 DAYS  Final   Report Status 07/02/2016 FINAL  Final  CULTURE, BLOOD (ROUTINE X 2) w Reflex to ID Panel     Status: None   Collection Time: 06/27/16  7:29 AM  Result Value Ref Range Status   Specimen Description BLOOD RIGHT ASSIST CONTROL  Final   Special Requests   Final    BOTTLES DRAWN AEROBIC AND ANAEROBIC Blood Culture adequate volume   Culture NO GROWTH 5 DAYS  Final   Report Status 07/02/2016 FINAL  Final    Coagulation Studies:  Recent Labs   07/06/16 0016 07/07/16 0501 07/08/16 0450  LABPROT 20.8* 22.7* 25.9*  INR 1.77 1.97 2.32    Urinalysis: No results for input(s): COLORURINE, LABSPEC, PHURINE, GLUCOSEU, HGBUR, BILIRUBINUR, KETONESUR, PROTEINUR, UROBILINOGEN, NITRITE, LEUKOCYTESUR in the last 72 hours.  Invalid input(s): APPERANCEUR    Imaging: Ct Pelvis W Contrast  Result Date: 07/07/2016 CLINICAL DATA:  Severe right groin pain. Drop in hematocrit and hemoglobin. Status post right common femoral, profunda femoris and superficial femoral artery endarterectomies with separate core matrix patch angioplasty of the common and superficial femoral arteries and second patchy angioplasty of the profunda femoris artery on 07/03/2016. Additional angioplasties of the right popliteal and right posterior tibial arteries. EXAM: CT PELVIS AND BILATERAL LOWER EXTREMITIES WITH CONTRAST TECHNIQUE: Multidetector CT imaging of the pelvis and bilateral lower extremities was performed according to the standard protocol following bolus administration of intravenous contrast. Multiplanar CT image reconstructions were also generated. CONTRAST:  100mL ISOVUE-300 IOPAMIDOL (ISOVUE-300) INJECTION 61% COMPARISON:  None. FINDINGS: CT PELVIS FINDINGS Urinary Tract:  Unremarkable urinary bladder and distal ureters. Bowel:  Unremarkable. Vascular/Lymphatic: Atheromatous arterial calcifications and plaques. Reproductive:  Normal sized prostate gland. Other: Small bilateral inguinal hernias containing fat. Mild right groin. Vascular and subcutaneous edema and postoperative air, extending superiorly and inferiorly. No discrete abscess or hematoma seen. Musculoskeletal: Mild lower lumbar spine degenerative changes. Left iliac bone cyst. CT LEFT/RIGHT LOWER EXTREMITY FINDINGS Bones/Joint/Cartilage Unremarkable. Ligaments Suboptimally assessed by CT. Muscles and Tendons Mild low density in the lateral aspect of the proximal adductor longus muscle on the right. No  significant enlargement of the masses Soft tissues Mild diffuse subcutaneous edema anteriorly, laterally and medially in the proximal right thigh. Vascular: Multifocal arterial atheromatous calcifications and plaques. IMPRESSION: 1. Right groin postoperative changes with mild associated soft tissue edema and air, including mild edema in the lateral aspect of the proximal adductor longus muscle on the right. No visible hematoma. 2. Extensive bilateral arterial atheromatous changes. 3. Small bilateral inguinal hernias containing fat. Electronically Signed   By: Beckie SaltsSteven  Reid M.D.   On: 07/07/2016 13:28   Ct Extrem Lower W Cm Bil  Result Date: 07/07/2016 CLINICAL DATA:  Severe right groin pain. Drop in hematocrit and hemoglobin. Status post right common femoral, profunda femoris and superficial femoral artery endarterectomies with separate core matrix patch angioplasty of the common and superficial femoral arteries and second patchy angioplasty of the profunda femoris artery on 07/03/2016. Additional angioplasties of the right popliteal and right posterior tibial arteries. EXAM: CT PELVIS AND BILATERAL LOWER EXTREMITIES WITH CONTRAST TECHNIQUE: Multidetector CT imaging of the pelvis and bilateral lower extremities was performed according to  the standard protocol following bolus administration of intravenous contrast. Multiplanar CT image reconstructions were also generated. CONTRAST:  ISOVUE-300 IOPAMIDOL (ISOVUE-300) INJECTION 61% COMPARISON:  None. FINDINGS: CT PELVIS FINDINGS Urinary Tract:  Unremarkable urinary bladder and distal ureters. Bowel:  Unremarkable. Vascular/Lymphatic: Atheromatous arterial calcifications and plaques. Reproductive:  Normal sized prostate gland. Other: Small bilateral inguinal hernias containing fat. Mild right groin. Vascular and subcutaneous edema and postoperative air, extending superiorly and inferiorly. No discrete abscess or hematoma seen. Musculoskeletal: Mild lower lumbar  spine degenerative changes. Left iliac bone cyst. CT LEFT/RIGHT LOWER EXTREMITY FINDINGS Bones/Joint/Cartilage Unremarkable. Ligaments Suboptimally assessed by CT. Muscles and Tendons Mild low density in the lateral aspect of the proximal adductor longus muscle on the right. No significant enlargement of the masses Soft tissues Mild diffuse subcutaneous edema anteriorly, laterally and medially in the proximal right thigh. Vascular: Multifocal arterial atheromatous calcifications and plaques. IMPRESSION: 1. Right groin postoperative changes with mild associated soft tissue edema and air, including mild edema in the lateral aspect of the proximal adductor longus muscle on the right. No visible hematoma. 2. Extensive bilateral arterial atheromatous changes. 3. Small bilateral inguinal hernias containing fat. Electronically Signed   By: Beckie Salts M.D.   On: 07/07/2016 13:28     Medications:   . sodium chloride     . amoxicillin-clavulanate  1 tablet Oral Q24H  . atorvastatin  80 mg Oral QPM  . epoetin (EPOGEN/PROCRIT) injection  10,000 Units Intravenous Q T,Th,Sa-HD  . feeding supplement (NEPRO CARB STEADY)  237 mL Oral BID BM  . ferric citrate  420 mg Oral TID WC  . insulin aspart  0-5 Units Subcutaneous QHS  . insulin aspart  0-9 Units Subcutaneous TID WC  . lisinopril  2.5 mg Oral Daily  . living well with diabetes book   Does not apply Once  . Melatonin  5 mg Oral QHS  . polyethylene glycol  17 g Oral Daily  . senna-docusate  1 tablet Oral Daily  . tamsulosin  0.4 mg Oral QHS   acetaminophen **OR** acetaminophen, benzonatate, bisacodyl, heparin, ipratropium-albuterol, lidocaine (PF), lidocaine-prilocaine, morphine injection, nitroGLYCERIN, ondansetron **OR** ondansetron (ZOFRAN) IV, ondansetron (ZOFRAN) IV, oxyCODONE, oxyCODONE, pentafluoroprop-tetrafluoroeth, phenol, phenol-menthol, polyethylene glycol  Assessment/ Plan:  61 y.o. Hispanic male ESRD on HD TTS, coronary artery disease,  diabetes mellitus type 2, hyperlipidemia, hypertension, history of CVA, anemia chronic kidney disease, secondary hyperparathyroidism who presents with gangrenous changes on toes of both feet.  [facility] TTS [address] Left AVF   1.  ESRD on HD TTS:   Continue TTS schedule.   We'll order short makeup treatment for 2 hours   2.  Anemia chronic kidney disease:  Hemoglobin low - Epogen with dialysis.   - blood transfusion - stool hemoccult  3.  Secondary hyperparathyroidism.   - Auryxia with meals.  - monitor phos  4. Peripheral arterial disease:  Status post right lower extremity endarterectomies on 5/4 Dr. Gilda Crease.  Please note that patient later received a bed assignment at rehabilitation; no transportation available after 3 PM Make up Dialysis treatment is cancelled for today. He will follow up for routine dialysis tomorrow     LOS: 14 Sondi Desch 5/9/20183:18 PM

## 2016-07-08 NOTE — Discharge Instructions (Signed)
Follow-up with primary care physician at the facility in 2 days with PT/INR on May 10 for the management of the Coumadin by primary care physician at the facility Follow up with  vascular surgery, podiatry, cardiology as recommended Continue hemodialysis on Tuesday, Thursday and Saturday Follow-up with nephrology in 2-3 days

## 2016-07-08 NOTE — Progress Notes (Signed)
Per Dr. Amado CoeGouru okay to discharge patient instead of going to hemodialysis per nephrology.

## 2016-07-09 NOTE — Telephone Encounter (Signed)
Patient [facility] 07/[facility].  Patient understands to follow up with provider Ward Givenshris Berge NP on 07/22/16 at 09:00AM at [facility]. Patient understands discharge instructions? Yes Patient understands medications and regiment? Yes Patient understands to bring all medications to this visit? Yes  Patients daughter verbalized understanding to have him call back if he has any further questions.

## 2016-07-20 ENCOUNTER — Inpatient Hospital Stay
Admission: AD | Admit: 2016-07-20 | Discharge: 2016-07-24 | DRG: 255 | Disposition: A | Discharge status: Skilled Nursing Facility | Payer: Medicare Other | Source: Ambulatory Visit | Attending: Internal Medicine | Admitting: Internal Medicine

## 2016-07-20 ENCOUNTER — Encounter
Admission: AD | Disposition: A | Discharge status: Skilled Nursing Facility | Payer: Self-pay | Source: Ambulatory Visit | Attending: Internal Medicine

## 2016-07-20 DIAGNOSIS — I2510 Atherosclerotic heart disease of native coronary artery without angina pectoris: Secondary | ICD-10-CM | POA: Diagnosis present

## 2016-07-20 DIAGNOSIS — Z7901 Long term (current) use of anticoagulants: Secondary | ICD-10-CM

## 2016-07-20 DIAGNOSIS — E1169 Type 2 diabetes mellitus with other specified complication: Secondary | ICD-10-CM | POA: Diagnosis present

## 2016-07-20 DIAGNOSIS — Z992 Dependence on renal dialysis: Secondary | ICD-10-CM

## 2016-07-20 DIAGNOSIS — E785 Hyperlipidemia, unspecified: Secondary | ICD-10-CM | POA: Diagnosis present

## 2016-07-20 DIAGNOSIS — M86671 Other chronic osteomyelitis, right ankle and foot: Secondary | ICD-10-CM | POA: Diagnosis present

## 2016-07-20 DIAGNOSIS — Z8673 Personal history of transient ischemic attack (TIA), and cerebral infarction without residual deficits: Secondary | ICD-10-CM

## 2016-07-20 DIAGNOSIS — E1152 Type 2 diabetes mellitus with diabetic peripheral angiopathy with gangrene: Secondary | ICD-10-CM | POA: Diagnosis present

## 2016-07-20 DIAGNOSIS — I252 Old myocardial infarction: Secondary | ICD-10-CM

## 2016-07-20 DIAGNOSIS — M86672 Other chronic osteomyelitis, left ankle and foot: Secondary | ICD-10-CM | POA: Diagnosis present

## 2016-07-20 DIAGNOSIS — I96 Gangrene, not elsewhere classified: Secondary | ICD-10-CM | POA: Diagnosis present

## 2016-07-20 DIAGNOSIS — I120 Hypertensive chronic kidney disease with stage 5 chronic kidney disease or end stage renal disease: Secondary | ICD-10-CM | POA: Diagnosis present

## 2016-07-20 DIAGNOSIS — I480 Paroxysmal atrial fibrillation: Secondary | ICD-10-CM | POA: Diagnosis present

## 2016-07-20 DIAGNOSIS — Z7984 Long term (current) use of oral hypoglycemic drugs: Secondary | ICD-10-CM

## 2016-07-20 DIAGNOSIS — Z79899 Other long term (current) drug therapy: Secondary | ICD-10-CM | POA: Diagnosis not present

## 2016-07-20 DIAGNOSIS — H5461 Unqualified visual loss, right eye, normal vision left eye: Secondary | ICD-10-CM | POA: Diagnosis present

## 2016-07-20 DIAGNOSIS — Z951 Presence of aortocoronary bypass graft: Secondary | ICD-10-CM | POA: Diagnosis not present

## 2016-07-20 DIAGNOSIS — Z794 Long term (current) use of insulin: Secondary | ICD-10-CM | POA: Diagnosis not present

## 2016-07-20 DIAGNOSIS — Z7982 Long term (current) use of aspirin: Secondary | ICD-10-CM

## 2016-07-20 DIAGNOSIS — Z87891 Personal history of nicotine dependence: Secondary | ICD-10-CM

## 2016-07-20 DIAGNOSIS — N186 End stage renal disease: Secondary | ICD-10-CM | POA: Diagnosis present

## 2016-07-20 DIAGNOSIS — E1122 Type 2 diabetes mellitus with diabetic chronic kidney disease: Secondary | ICD-10-CM | POA: Diagnosis present

## 2016-07-20 LAB — CBC WITH DIFFERENTIAL/PLATELET
Basophils Absolute: 0.1 10*3/uL (ref 0–0.1)
Basophils Relative: 1 %
Eosinophils Absolute: 0.2 10*3/uL (ref 0–0.7)
Eosinophils Relative: 3 %
HEMATOCRIT: 29.4 % — AB (ref 40.0–52.0)
HEMOGLOBIN: 9.9 g/dL — AB (ref 13.0–18.0)
Lymphocytes Relative: 13 %
Lymphs Abs: 0.8 10*3/uL — ABNORMAL LOW (ref 1.0–3.6)
MCH: 29.5 pg (ref 26.0–34.0)
MCHC: 33.8 g/dL (ref 32.0–36.0)
MCV: 87.5 fL (ref 80.0–100.0)
Monocytes Absolute: 0.5 10*3/uL (ref 0.2–1.0)
Monocytes Relative: 9 %
Neutro Abs: 4.3 10*3/uL (ref 1.4–6.5)
Neutrophils Relative %: 74 %
Platelets: 287 10*3/uL (ref 150–440)
RBC: 3.36 MIL/uL — AB (ref 4.40–5.90)
RDW: 17 % — ABNORMAL HIGH (ref 11.5–14.5)
WBC: 5.9 10*3/uL (ref 3.8–10.6)

## 2016-07-20 LAB — PROTIME-INR
INR: 3.54
Prothrombin Time: 36.3 seconds — ABNORMAL HIGH (ref 11.4–15.2)

## 2016-07-20 LAB — COMPREHENSIVE METABOLIC PANEL
ALBUMIN: 2.8 g/dL — AB (ref 3.5–5.0)
ALK PHOS: 109 U/L (ref 38–126)
ALT: 17 U/L (ref 17–63)
AST: 23 U/L (ref 15–41)
Anion gap: 10 (ref 5–15)
BILIRUBIN TOTAL: 0.6 mg/dL (ref 0.3–1.2)
BUN: 27 mg/dL — AB (ref 6–20)
CALCIUM: 8.4 mg/dL — AB (ref 8.9–10.3)
CO2: 28 mmol/L (ref 22–32)
Chloride: 95 mmol/L — ABNORMAL LOW (ref 101–111)
Creatinine, Ser: 5.65 mg/dL — ABNORMAL HIGH (ref 0.61–1.24)
GFR calc Af Amer: 11 mL/min — ABNORMAL LOW (ref >60)
GFR calc non Af Amer: 10 mL/min — ABNORMAL LOW (ref >60)
GLUCOSE: 131 mg/dL — AB (ref 65–99)
Potassium: 3.3 mmol/L — ABNORMAL LOW (ref 3.5–5.1)
Sodium: 133 mmol/L — ABNORMAL LOW (ref 135–145)
TOTAL PROTEIN: 7.4 g/dL (ref 6.5–8.1)

## 2016-07-20 LAB — LACTIC ACID, PLASMA: Lactic Acid, Venous: 0.9 mmol/L (ref 0.5–1.9)

## 2016-07-20 LAB — GLUCOSE, CAPILLARY
GLUCOSE-CAPILLARY: 147 mg/dL — AB (ref 65–99)
GLUCOSE-CAPILLARY: 138 mg/dL — AB (ref 65–99)
Glucose-Capillary: 128 mg/dL — ABNORMAL HIGH (ref 65–99)
Glucose-Capillary: 103 mg/dL — ABNORMAL HIGH (ref 65–99)

## 2016-07-20 SURGERY — AMPUTATION, TOE
Anesthesia: Choice | Laterality: Left

## 2016-07-20 MED ORDER — MORPHINE SULFATE (PF) 2 MG/ML IV SOLN
2.0000 mg | INTRAVENOUS | Status: DC | PRN
  Administered 2016-07-23 – 2016-07-24 (×3): 2 mg via INTRAVENOUS
  Filled 2016-07-20 (×3): qty 1

## 2016-07-20 MED ORDER — INSULIN ASPART 100 UNIT/ML ~~LOC~~ SOLN: 0.0000 [IU] | Freq: Every day | SUBCUTANEOUS | Status: DC

## 2016-07-20 MED ORDER — IPRATROPIUM-ALBUTEROL 0.5-2.5 (3) MG/3ML IN SOLN: 3.0000 mL | Freq: Four times a day (QID) | RESPIRATORY_TRACT | Status: DC

## 2016-07-20 MED ORDER — ACETAMINOPHEN 325 MG PO TABS
650.0000 mg | ORAL_TABLET | Freq: Four times a day (QID) | ORAL | Status: DC | PRN
  Administered 2016-07-23: 650 mg via ORAL
  Filled 2016-07-20 (×2): qty 2

## 2016-07-20 MED ORDER — OXYCODONE HCL 5 MG PO TABS
5.0000 mg | ORAL_TABLET | ORAL | Status: DC | PRN
  Administered 2016-07-20 – 2016-07-23 (×11): 5 mg via ORAL
  Filled 2016-07-20 (×11): qty 1

## 2016-07-20 MED ORDER — POTASSIUM CHLORIDE CRYS ER 20 MEQ PO TBCR
40.0000 meq | EXTENDED_RELEASE_TABLET | ORAL | Status: AC
  Administered 2016-07-20 (×2): 40 meq via ORAL
  Filled 2016-07-20 (×2): qty 2

## 2016-07-20 MED ORDER — TAMSULOSIN HCL 0.4 MG PO CAPS
0.4000 mg | ORAL_CAPSULE | Freq: Every day | ORAL | Status: DC
  Administered 2016-07-20 – 2016-07-23 (×4): 0.4 mg via ORAL
  Filled 2016-07-20 (×4): qty 1

## 2016-07-20 MED ORDER — PIPERACILLIN-TAZOBACTAM 3.375 G IVPB
3.3750 g | Freq: Two times a day (BID) | INTRAVENOUS | Status: DC
  Administered 2016-07-20 – 2016-07-24 (×9): 3.375 g via INTRAVENOUS
  Filled 2016-07-20 (×10): qty 50

## 2016-07-20 MED ORDER — FERRIC CITRATE 1 GM 210 MG(FE) PO TABS
420.0000 mg | ORAL_TABLET | Freq: Three times a day (TID) | ORAL | Status: DC
  Administered 2016-07-20 – 2016-07-24 (×6): 420 mg via ORAL
  Filled 2016-07-20 (×13): qty 2

## 2016-07-20 MED ORDER — POLYETHYLENE GLYCOL 3350 17 G PO PACK: 17.0000 g | PACK | Freq: Every day | ORAL | Status: DC | PRN

## 2016-07-20 MED ORDER — METOPROLOL TARTRATE 25 MG PO TABS
12.5000 mg | ORAL_TABLET | Freq: Two times a day (BID) | ORAL | Status: DC
  Administered 2016-07-20 – 2016-07-24 (×9): 12.5 mg via ORAL
  Filled 2016-07-20 (×9): qty 1

## 2016-07-20 MED ORDER — ONDANSETRON HCL 4 MG PO TABS: 4.0000 mg | ORAL_TABLET | Freq: Four times a day (QID) | ORAL | Status: DC | PRN

## 2016-07-20 MED ORDER — ONDANSETRON HCL 4 MG/2ML IJ SOLN
4.0000 mg | Freq: Four times a day (QID) | INTRAMUSCULAR | Status: DC | PRN
Start: 2016-07-20 — End: 2016-07-24

## 2016-07-20 MED ORDER — SODIUM CHLORIDE 0.9 % IV BOLUS (SEPSIS)
1000.0000 mL | Freq: Once | INTRAVENOUS | Status: AC
  Administered 2016-07-20: 1000 mL via INTRAVENOUS

## 2016-07-20 MED ORDER — ACETAMINOPHEN 325 MG PO TABS: 650.0000 mg | ORAL_TABLET | Freq: Four times a day (QID) | ORAL | Status: DC | PRN

## 2016-07-20 MED ORDER — ATORVASTATIN CALCIUM 20 MG PO TABS
80.0000 mg | ORAL_TABLET | Freq: Every evening | ORAL | Status: DC
  Filled 2016-07-20: qty 4

## 2016-07-20 MED ORDER — METOPROLOL TARTRATE 5 MG/5ML IV SOLN
5.0000 mg | INTRAVENOUS | Status: DC | PRN
  Administered 2016-07-20: 5 mg via INTRAVENOUS
  Filled 2016-07-20: qty 5

## 2016-07-20 MED ORDER — ACETAMINOPHEN 650 MG RE SUPP: 650.0000 mg | Freq: Four times a day (QID) | RECTAL | Status: DC | PRN

## 2016-07-20 MED ORDER — INSULIN ASPART 100 UNIT/ML ~~LOC~~ SOLN
0.0000 [IU] | Freq: Three times a day (TID) | SUBCUTANEOUS | Status: DC
  Administered 2016-07-21: 3 [IU] via SUBCUTANEOUS
  Administered 2016-07-22 – 2016-07-24 (×5): 1 [IU] via SUBCUTANEOUS
  Filled 2016-07-20: qty 1
  Filled 2016-07-20: qty 3
  Filled 2016-07-20 (×4): qty 1

## 2016-07-20 MED ORDER — NITROGLYCERIN 0.4 MG SL SUBL: 0.4000 mg | SUBLINGUAL_TABLET | SUBLINGUAL | Status: DC | PRN

## 2016-07-20 MED ORDER — SENNOSIDES-DOCUSATE SODIUM 8.6-50 MG PO TABS: 1.0000 | ORAL_TABLET | Freq: Every evening | ORAL | Status: DC | PRN

## 2016-07-20 MED ORDER — IPRATROPIUM-ALBUTEROL 0.5-2.5 (3) MG/3ML IN SOLN: 3.0000 mL | Freq: Four times a day (QID) | RESPIRATORY_TRACT | Status: DC | PRN

## 2016-07-20 NOTE — [place] FL2 (Signed)
[place] MEDICAID FL2 LEVEL OF CARE SCREENING TOOL     IDENTIFICATION  Patient Name: Eddie Myers Birthdate: 20-Sep-1955 Sex: male Admission Date (Current Location): 07/20/2016  [place]   Facility and Address:  [facility], [address], [place], [place] [address]      Provider Number: [id number]  Attending Physician Name and Address:  Ramonita LabGouru, Aime Carreras, MD  Relative Name and Phone Number:       Current Level of Care: Hospital Recommended Level of Care: Skilled Nursing Facility Prior Approval Number:    Date Approved/Denied:   PASRR Number:   [id number]  Discharge Plan: SNF    Current Diagnoses: Patient Active Problem List   Diagnosis Date Noted  . Gangrene of foot (HCC) 07/20/2016  . Gangrene (HCC) 06/24/2016  . Vision loss of right eye 06/24/2016  . Gangrene of lower extremity (HCC) 06/24/2016  . Encounter for therapeutic drug monitoring 03/25/2016  . Paroxysmal atrial fibrillation (HCC) 03/07/2016  . Renal dialysis device, implant, or graft complication 01/22/2016  . ESRD on dialysis (HCC) 01/06/2016  . Type 2 diabetes mellitus with complication (HCC) 01/06/2016  . Anginal pain (HCC) 11/20/2015  . Hypertension 11/20/2015  . Coronary artery disease involving native coronary artery of native heart without angina pectoris 11/20/2015  . Cardiomyopathy, ischemic 11/20/2015  . Stroke (HCC) 11/20/2015    Orientation RESPIRATION BLADDER Height & Weight     Self, Time, Situation, Place  Normal Continent Weight:   Height:     BEHAVIORAL SYMPTOMS/MOOD NEUROLOGICAL BOWEL NUTRITION STATUS   (none)  (none) Continent Diet (Diet: NPO to be advanced. )  AMBULATORY STATUS COMMUNICATION OF NEEDS Skin   Extensive Assist Verbally Other (Comment) (Toe amputation )                       Personal Care Assistance Level of Assistance  Bathing, Feeding, Dressing Bathing Assistance: Limited assistance Feeding assistance:  Independent Dressing Assistance: Limited assistance     Functional Limitations Info  Sight, Hearing, Speech Sight Info: Impaired Hearing Info: Adequate Speech Info: Adequate    SPECIAL CARE FACTORS FREQUENCY  PT (By licensed PT), OT (By licensed OT)     PT Frequency:  (5) OT Frequency:  (5)            Contractures      Additional Factors Info  Code Status, Allergies, Insulin Sliding Scale Code Status Info:  (Full Code. ) Allergies Info:  (No Known Allergies. )   Insulin Sliding Scale Info:  (NovoLog Insulin Injections. )       Current Medications (07/20/2016):  This is the current hospital active medication list Current Facility-Administered Medications  Medication Dose Route Frequency Provider Last Rate Last Dose  . acetaminophen (TYLENOL) tablet 650 mg  650 mg Oral Q6H PRN Jayveion Stalling, MD       Or  . acetaminophen (TYLENOL) suppository 650 mg  650 mg Rectal Q6H PRN Najeeb Uptain, MD      . atorvastatin (LIPITOR) tablet 80 mg  80 mg Oral QPM Rylei Masella, MD      . insulin aspart (novoLOG) injection 0-5 Units  0-5 Units Subcutaneous QHS Vaughan Garfinkle, MD      . insulin aspart (novoLOG) injection 0-9 Units  0-9 Units Subcutaneous TID WC Alaiah Lundy, MD      . ipratropium-albuterol (DUONEB) 0.5-2.5 (3) MG/3ML nebulizer solution 3 mL  3 mL Nebulization Q6H Adarryl Goldammer, MD      .  morphine 2 MG/ML injection 2 mg  2 mg Intravenous Q4H PRN Alianna Wurster, MD      . nitroGLYCERIN (NITROSTAT) SL tablet 0.4 mg  0.4 mg Sublingual Q5 min PRN Boluwatife Mutchler, MD      . ondansetron (ZOFRAN) tablet 4 mg  4 mg Oral Q6H PRN Fitzhugh Vizcarrondo, MD       Or  . ondansetron (ZOFRAN) injection 4 mg  4 mg Intravenous Q6H PRN Tomothy Eddins, MD      . oxyCODONE (Oxy IR/ROXICODONE) immediate release tablet 5 mg  5 mg Oral Q4H PRN Kalonji Zurawski, MD      . piperacillin-tazobactam (ZOSYN) IVPB 3.375 g  3.375 g Intravenous Q12H Laketta Soderberg, MD      . polyethylene glycol (MIRALAX / GLYCOLAX) packet 17 g  17 g  Oral Daily PRN Maurice Ramseur, MD      . potassium chloride SA (K-DUR,KLOR-CON) CR tablet 40 mEq  40 mEq Oral Q4H Ermina Oberman, MD      . senna-docusate (Senokot-S) tablet 1 tablet  1 tablet Oral QHS PRN Gayleen Sholtz, MD      . sodium chloride 0.9 % bolus 1,000 mL  1,000 mL Intravenous Once Sudini, Srikar, MD      . tamsulosin (FLOMAX) capsule 0.4 mg  0.4 mg Oral QHS Ladawn Boullion, Deanna Artis, MD         Discharge Medications: Please see discharge summary for a list of discharge medications.  Relevant Imaging Results:  Relevant Lab Results:   Additional Information  (SSN: [id number])  Sample, Darleen Crocker, LCSW

## 2016-07-20 NOTE — Progress Notes (Signed)
Patient is A&O x4, Spanish speaking only. Incision to right groin is intact with dried puss to some areas with some hardness to the bottom part of incision. Tele in place, converted to NSR, HR 80's. Blood sugars with no coverage given. INR 3.54, no surgery this shift. Diet ordered and tolerated well. Dressing to bilat foot, placed by Dr. Alberteen Spindleline this am.

## 2016-07-20 NOTE — Progress Notes (Signed)
HR 145-155 sustained, per tele. Notified MD. MD to place new orders.

## 2016-07-20 NOTE — H&P (Signed)
[facility] - [place] at [facility]   PATIENT NAME: Eddie Myers    MR#:  [id number]  DATE OF BIRTH:  June 17, 1955  DATE OF ADMISSION:  07/20/2016  PRIMARY CARE PHYSICIAN: System, Pcp Not In   REQUESTING/REFERRING PHYSICIAN: dr.Cline   CHIEF COMPLAINT:    HISTORY OF PRESENT ILLNESS:  Eddie Myers  is a 61 y.o. male with a known history of End-stage renal disease on hemodialysis Tuesday, Thursday and Saturday, diabetes, hypertension, hyperlipidemia, history of previous CVA, history of coronary artery disease status post bypass who presented to the hospital as a direct admit from podiatry office after his follow-up visit today as his dry gangrene of the left foot is becoming wet and podiatry is planning to do surgery today. Patient is on Coumadin and he is reporting that it was held a few days back today's INR is pending. Patient's last food intake was yesterday night. Reports left foot is painful. Patient also is reporting that the right groin area where he had endarterectomy done during the previous admission is hurting and noticed some pus. Denies any fever  PAST MEDICAL HISTORY:   Past Medical History:  Diagnosis Date  . Anginal pain (HCC)   . Coronary artery disease   . Diabetes mellitus without complication (HCC)   . Dialysis patient (HCC)    Tues, Thurs, Sat  . Dyspnea    with exertion  . Elevated lipids   . Enlarged prostate   . ESRD (end stage renal disease) (HCC)    dialysis M-W-F  . Hypertension   . Myocardial infarction (HCC) 08/2015  . Renal insufficiency   . Stroke [facility])    Lt side this year July    PAST SURGICAL HISTOIRY:   Past Surgical History:  Procedure Laterality Date  . ABDOMINAL AORTOGRAM W/LOWER EXTREMITY Left 06/26/2016   Procedure: Abdominal Aortogram w/Lower Extremity;  Surgeon: Renford Dills, MD;  Location: [facility] INVASIVE CV LAB;  Service: Cardiovascular;  Laterality: Left;  . AV FISTULA PLACEMENT Left 01/10/2016    Procedure: INSERTION OF ARTERIOVENOUS (AV) GORE-TEX GRAFT ARM ( BRACH / AXILLARY GRAFT );  Surgeon: Renford Dills, MD;  Location: [facility] ORS;  Service: Vascular;  Laterality: Left;  . CARDIAC CATHETERIZATION    . CORONARY ARTERY BYPASS GRAFT  01/2014   [facility] (LIMA -> LAD and SVG -> OM)  . INSERTION OF DIALYSIS CATHETER Right    Perma catheter  . LOWER EXTREMITY ANGIOGRAPHY  07/03/2016   Procedure: Lower Extremity Angiography;  Surgeon: Renford Dills, MD;  Location: [facility] INVASIVE CV LAB;  Service: Cardiovascular;;  . LOWER EXTREMITY INTERVENTION  07/03/2016   Procedure: Lower Extremity Intervention;  Surgeon: Renford Dills, MD;  Location: [facility] INVASIVE CV LAB;  Service: Cardiovascular;;  . PERIPHERAL VASCULAR BALLOON ANGIOPLASTY Left 07/01/2016   Procedure: Peripheral Vascular Balloon Angioplasty;  Surgeon: Renford Dills, MD;  Location: [facility] INVASIVE CV LAB;  Service: Cardiovascular;  Laterality: Left;  . PERIPHERAL VASCULAR BALLOON ANGIOPLASTY N/A 07/03/2016   Procedure: Peripheral Vascular Balloon Angioplasty;  Surgeon: Renford Dills, MD;  Location: [facility] INVASIVE CV LAB;  Service: Cardiovascular;  Laterality: N/A;  . PERIPHERAL VASCULAR CATHETERIZATION N/A 01/17/2016   Procedure: Thrombectomy;  Surgeon: Renford Dills, MD;  Location: [facility] INVASIVE CV LAB;  Service: Cardiovascular;  Laterality: N/A;  . PERIPHERAL VASCULAR CATHETERIZATION Left 01/28/2016   Procedure: A/V Fistulagram;  Surgeon: Renford Dills, MD;  Location: [facility] INVASIVE CV LAB;  Service: Cardiovascular;  Laterality: Left;  . PERIPHERAL  VASCULAR CATHETERIZATION N/A 03/24/2016   Procedure: Dialysis/Perma Catheter Removal;  Surgeon: Renford Dills, MD;  Location: [facility] INVASIVE CV LAB;  Service: Cardiovascular;  Laterality: N/A;  . UPPER EXTREMITY ANGIOGRAM Left 01/28/2016   Procedure: Upper Extremity Angiogram;  Surgeon: Renford Dills, MD;  Location: [facility] INVASIVE CV LAB;  Service: Cardiovascular;   Laterality: Left;    SOCIAL HISTORY:   Social History  Substance Use Topics  . Smoking status: Former Smoker    Packs/day: 0.50    Years: 45.00    Quit date: 01/05/2015  . Smokeless tobacco: Never Used  . Alcohol use No    FAMILY HISTORY:   Family History  Problem Relation Age of Onset  . Cancer Mother   . Diabetes Mother   . Diabetes Father     DRUG ALLERGIES:  No Known Allergies  REVIEW OF SYSTEMS:  CONSTITUTIONAL: No fever, fatigue or weakness.  EYES: No blurred or double vision.  EARS, NOSE, AND THROAT: No tinnitus or ear pain.  RESPIRATORY: No cough, shortness of breath, wheezing or hemoptysis.  CARDIOVASCULAR: No chest pain, orthopnea, edema.  GASTROINTESTINAL: No nausea, vomiting, diarrhea or abdominal pain.  GENITOURINARY: No dysuria, hematuria.  ENDOCRINE: No polyuria, nocturia,  HEMATOLOGY: No anemia, easy bruising or bleeding SKIN: No rash or lesion. MUSCULOSKELETAL: Right groin incision site is painful and is reporting pus. Left foot toes are painful NEUROLOGIC: No tingling, numbness, weakness.  PSYCHIATRY: No anxiety or depression.   MEDICATIONS AT HOME:   Prior to Admission medications   Medication Sig Start Date End Date Taking? Authorizing Provider  acetaminophen (TYLENOL) 325 MG tablet Take 2 tablets (650 mg total) by mouth every 6 (six) hours as needed for mild pain (or Fever >/= 101). 07/08/16   Ramonita Lab, MD  aspirin EC 81 MG tablet Take 1 tablet (81 mg total) by mouth daily. 07/08/16   Tiegan Terpstra, Deanna Artis, MD  atorvastatin (LIPITOR) 80 MG tablet Take 80 mg by mouth every evening.  02/07/16   [provider]  ferric citrate (AURYXIA) 1 GM 210 MG(Fe) tablet Take 420 mg by mouth 3 (three) times daily with meals.    [provider]  glipiZIDE (GLUCOTROL) 5 MG tablet Take 5 mg by mouth 2 (two) times daily.     [provider]  insulin aspart (NOVOLOG) 100 UNIT/ML injection Inject 0-9 Units into the skin 3 (three) times daily with  meals. 07/08/16   Velta Rockholt, Deanna Artis, MD  insulin aspart (NOVOLOG) 100 UNIT/ML injection Inject 0-5 Units into the skin at bedtime. 07/08/16   Rmoni Keplinger, Deanna Artis, MD  insulin regular (NOVOLIN R,HUMULIN R) 100 units/mL injection Inject 0-10 Units into the skin 3 (three) times daily before meals.     [provider]  ipratropium-albuterol (DUONEB) 0.5-2.5 (3) MG/3ML SOLN Take 3 mLs by nebulization every 6 (six) hours as needed. 07/08/16   Ludger Bones, Deanna Artis, MD  lisinopril (PRINIVIL,ZESTRIL) 2.5 MG tablet Take 2.5 mg by mouth daily.    [provider]  Melatonin 3 MG TABS Take 3 mg by mouth at bedtime.     [provider]  nitroGLYCERIN (NITROSTAT) 0.4 MG SL tablet Place 0.4 mg under the tongue every 5 (five) minutes as needed for chest pain.    [provider]  Nutritional Supplements (FEEDING SUPPLEMENT, NEPRO CARB STEADY,) LIQD Take 237 mLs by mouth 2 (two) times daily between meals. 07/08/16   Hunter Bachar, Deanna Artis, MD  ondansetron (ZOFRAN) 4 MG tablet Take 1 tablet (4 mg total) by mouth every 6 (six)  hours as needed for nausea. 07/08/16   Callan Yontz, Deanna ArtisAruna, MD  oxyCODONE (OXY IR/ROXICODONE) 5 MG immediate release tablet Take 1 tablet (5 mg total) by mouth every 4 (four) hours as needed for moderate pain or breakthrough pain (mild pain). 07/08/16   Avriel Kandel, Deanna ArtisAruna, MD  polyethylene glycol (MIRALAX / GLYCOLAX) packet Take 17 g by mouth daily as needed for moderate constipation. 07/08/16   Antron Seth, Deanna ArtisAruna, MD  senna-docusate (SENOKOT-S) 8.6-50 MG tablet Take 1 tablet by mouth at bedtime as needed for mild constipation. 07/08/16   Marilu Rylander, Deanna ArtisAruna, MD  tamsulosin (FLOMAX) 0.4 MG CAPS capsule Take 0.4 mg by mouth at bedtime.    [provider]  warfarin (COUMADIN) 5 MG tablet Take 1 tablet (5 mg total) by mouth daily at 6 PM. 5 mg once daily for 2 days. Repeat PT/INR on May 10 further management of Coumadin by the nursing home physician 07/08/16   Ramonita LabGouru, Lucian Baswell, MD      VITAL SIGNS:  There were no vitals taken for  this visit.  PHYSICAL EXAMINATION:  GENERAL:  61 y.o.-year-old patient lying in the bed with no acute distress.  EYES: Pupils equal, round, reactive to light and accommodation. No scleral icterus. Extraocular muscles intact.  HEENT: Head atraumatic, normocephalic. Oropharynx and nasopharynx clear.  NECK:  Supple, no jugular venous distention. No thyroid enlargement, no tenderness.  LUNGS: Normal breath sounds bilaterally, no wheezing, rales,rhonchi or crepitation. No use of accessory muscles of respiration.  CARDIOVASCULAR: S1, S2 normal. No murmurs, rubs, or gallops.  ABDOMEN: Soft, nontender, nondistended. Bowel sounds present. No organomegaly or mass.  EXTREMITIES:Left foot greate toe is gangrenous and tender with no clear demarcation. Right foot second toe is in clear dressing done by Dr. Alberteen Spindleline today morning   NEUROLOGIC: Cranial nerves II through XII are intact. Muscle strength 5/5 in all extremities. Sensation intact. Gait not checked.  PSYCHIATRIC: The patient is alert and oriented x 3.  SKIN: No obvious rash, lesion, or ulcer.   LABORATORY PANEL:   CBC  Recent Labs Lab 07/20/16 1006  WBC 5.9  HGB 9.9*  HCT 29.4*  PLT 287   ------------------------------------------------------------------------------------------------------------------  Chemistries   Recent Labs Lab 07/20/16 1006  NA 133*  K 3.3*  CL 95*  CO2 28  GLUCOSE 131*  BUN 27*  CREATININE 5.65*  CALCIUM 8.4*  AST 23  ALT 17  ALKPHOS 109  BILITOT 0.6   ------------------------------------------------------------------------------------------------------------------  Cardiac Enzymes No results for input(s): TROPONINI in the last 168 hours. ------------------------------------------------------------------------------------------------------------------  RADIOLOGY:  No results found.  EKG:   Orders placed or performed during the hospital encounter of 07/20/16  . EKG 12-Lead  . EKG 12-Lead     IMPRESSION AND PLAN:  Eddie Myers  is a 61 y.o. male with a known history of End-stage renal disease on hemodialysis Tuesday, Thursday and Saturday, diabetes, hypertension, hyperlipidemia, history of previous CVA, history of coronary artery disease status post bypass who presented to the hospital as a direct admit from podiatry office after his follow-up visit today as his dry gangrene of the left foot is becoming wet and podiatry is planning to do surgery today.   * Bilateral lower extremity cyanosis with necrotic toes -Admitted to MedSurg unit -Patient is nothing by mouth except for meds and sips from last night -Today's PT/INR is pending -Patient is reporting that his Coumadin is on hold --Cardiology has cleared the patient for surgery during previous admission  06/24/2016 .patient's recent nuclear stress test was normal -CT scan of  the pelvis and lower extremity with postoperative changes but no acute bleed -resume Coumadin and aspirin 81 mg enteric-coated per vascular recommnedations  *right groin incision with possible infection  Patient is started on IV Zosyn , consult vascular surgery  Pain control with IV and oral pain medications Status post left lower extremity angiogram with angioplasty of left superficial femoral, popliteal and peroneal arteries.s/p Right lower extremity angiogram - rt common femoral endarterectomy 07/04/16 with distal angioplasty .07/03/16   * Subacute right sided vision loss-patient had symptoms about 6 weeks ago.  -CT head shows a age-indeterminate right sided thalamic CV MRI shows old CVA  * Paroxysmal atrial fibrillation  Will hold Coumadin and check PT/INR    * End-stage renal disease on hemodialysis-patient gets dialysis on Tuesday Thursday Saturday. Consult nephrology    * Hypotension with h/o hypertension-continue lisinopril, carvedilol, Norvasc.  *h/o Diabetes type 2 without complication-patient's Lantus was discontinued during the  previous admission for hypoglycemia , continue SSI,   * Hyperlipidemia-continue atorvastatin    All the records are reviewed and case discussed with ED provider. Management plans discussed with the patient, daughter with the help of Spanish-speaking interpreter Mr. Maryjane Hurter and they are in agreement.  CODE STATUS:fc ,daughter HCPOA  TOTAL TIME TAKING CARE OF THIS PATIENT: 45 minutes.   Note: This dictation was prepared with Dragon dictation along with smaller phrase technology. Any transcriptional errors that result from this process are unintentional.  Ramonita Lab M.D on 07/20/2016 at 10:59 AM  Between 7am to 6pm - Pager - [phone]  After 6pm go to [email or web address] - password [email or web address]  [facility]  Office  [phone]  CC: Primary care physician; System, Pcp Not In

## 2016-07-20 NOTE — Progress Notes (Signed)
CH responded to AD for Hispanic Pt. Pt's daughter at the bedside. CH contacted translator, provided education; Pt to review AD and call CH when he ready to complete it. CH services available as needed.     07/20/16 1100  Clinical Encounter Type  Visited With Patient;Family  Visit Type Initial;Follow-up;Spiritual support  Referral From Nurse  Consult/Referral To Chaplain  Spiritual Encounters  Spiritual Needs Literature;Brochure  Stress Factors  Patient Stress Factors Health changes  Family Stress Factors Health changes  Advance Directives (For Healthcare)  Does Patient Have a Medical Advance Directive? No

## 2016-07-20 NOTE — Progress Notes (Signed)
Bolus finished, HR still elevated 130's. Notified MD, requesting tele order.

## 2016-07-20 NOTE — Progress Notes (Signed)
[facility]  ROUNDING NOTE   Subjective:   Eddie Myers admitted to [facility] on 07/20/2016 for Gangrene n/a  History taken with assistance of Spanish Interpreter. Daughter at bedside.   Patient went to see podiatry today where he was complaining of more pain.    Objective:  Vital signs in last 24 hours:     Weight change:  There were no vitals filed for this visit.  Intake/Output: No intake/output data recorded.   Intake/Output this shift:  No intake/output data recorded.  Physical Exam: General: No acute distress  Head: Normocephalic, atraumatic. Moist oral mucosal membranes  Eyes: Anicteric  Neck: Supple, trachea midline  Lungs:  Clear to auscultation, normal effort  Heart: irregular  Abdomen:  Soft, nontender,    Extremities: No peripheral edema. Gangrene left 1st, 2nd and 3rd toe, right 2nd toe  Neurologic: Nonfocal, moving all four extremities  Skin: No lesions  Access: LUE AVF    Basic Metabolic Panel:  Recent Labs Lab 07/20/16 1006  NA 133*  K 3.3*  CL 95*  CO2 28  GLUCOSE 131*  BUN 27*  CREATININE 5.65*  CALCIUM 8.4*    Liver Function Tests:  Recent Labs Lab 07/20/16 1006  AST 23  ALT 17  ALKPHOS 109  BILITOT 0.6  PROT 7.4  ALBUMIN 2.8*   No results for input(s): LIPASE, AMYLASE in the last 168 hours. No results for input(s): AMMONIA in the last 168 hours.  CBC:  Recent Labs Lab 07/20/16 1006  WBC 5.9  NEUTROABS 4.3  HGB 9.9*  HCT 29.4*  MCV 87.5  PLT 287    Cardiac Enzymes: No results for input(s): CKTOTAL, CKMB, CKMBINDEX, TROPONINI in the last 168 hours.  BNP: Invalid input(s): POCBNP  CBG:  Recent Labs Lab 07/20/16 1109  GLUCAP 128*    Microbiology: Results for orders placed or performed during the hospital encounter of 06/24/16  MRSA PCR Screening     Status: None   Collection Time: 06/24/16  5:05 AM  Result Value Ref Range Status   MRSA by PCR NEGATIVE NEGATIVE Final    Comment:        The  GeneXpert MRSA Assay (FDA approved for NASAL specimens only), is one component of a comprehensive MRSA colonization surveillance program. It is not intended to diagnose MRSA infection nor to guide or monitor treatment for MRSA infections.   CULTURE, BLOOD (ROUTINE X 2) w Reflex to ID Panel     Status: None   Collection Time: 06/27/16  7:22 AM  Result Value Ref Range Status   Specimen Description BLOOD RIGHT HAND  Final   Special Requests   Final    BOTTLES DRAWN AEROBIC AND ANAEROBIC Blood Culture adequate volume   Culture NO GROWTH 5 DAYS  Final   Report Status 07/02/2016 FINAL  Final  CULTURE, BLOOD (ROUTINE X 2) w Reflex to ID Panel     Status: None   Collection Time: 06/27/16  7:29 AM  Result Value Ref Range Status   Specimen Description BLOOD RIGHT ASSIST CONTROL  Final   Special Requests   Final    BOTTLES DRAWN AEROBIC AND ANAEROBIC Blood Culture adequate volume   Culture NO GROWTH 5 DAYS  Final   Report Status 07/02/2016 FINAL  Final    Coagulation Studies:  Recent Labs  07/20/16 1115  LABPROT 36.3*  INR 3.54    Urinalysis: No results for input(s): COLORURINE, LABSPEC, PHURINE, GLUCOSEU, HGBUR, BILIRUBINUR, KETONESUR, PROTEINUR, UROBILINOGEN, NITRITE, LEUKOCYTESUR in the last 72  hours.  Invalid input(s): APPERANCEUR    Imaging: No results found.   Medications:   . piperacillin-tazobactam (ZOSYN)  IV 3.375 g (07/20/16 1234)   . atorvastatin  80 mg Oral QPM  . insulin aspart  0-5 Units Subcutaneous QHS  . insulin aspart  0-9 Units Subcutaneous TID WC  . ipratropium-albuterol  3 mL Nebulization Q6H  . potassium chloride  40 mEq Oral Q4H  . tamsulosin  0.4 mg Oral QHS   acetaminophen **OR** acetaminophen, morphine injection, nitroGLYCERIN, ondansetron **OR** ondansetron (ZOFRAN) IV, oxyCODONE, polyethylene glycol, senna-docusate  Assessment/ Plan:  61 y.o. Hispanic male ESRD on HD TTS, coronary artery disease, diabetes mellitus type 2, hyperlipidemia,  hypertension, history of CVA, anemia chronic kidney disease, secondary hyperparathyroidism who presents with gangrenous changes on toes of both feet.  [facility] TTS [address] Left AVF   1.  ESRD on HD TTS:   Continue TTS schedule.   Next treatment for tomorrow.    2.  Anemia chronic kidney disease:  Hemoglobin 9.9 - Epogen with dialysis.    3.  Secondary hyperparathyroidism.   - Auryxia with meals.   4. Peripheral arterial disease:  Scheduled for toe amputation after cardiac clearance.   5. Hypertension: blood pressure at goal.      LOS: 0 Natan Hartog 5/21/201812:50 PM

## 2016-07-20 NOTE — Clinical Social Work Note (Signed)
Clinical Social Work Assessment  Patient Details  Name: Eddie Myers MRN: [id number] Date of Birth: 02-19-1956  Date of referral:  07/20/16               Reason for consult:  Other (Comment Required) (From [facility] STR)                Permission sought to share information with:  Chartered certified accountant granted to share information::  Yes, Verbal Permission Granted  Name::      Eddie Myers  Agency::   [place]   Relationship::     Contact Information:     Housing/Transportation Living arrangements for the past 2 months:  [place], [place] of Information:  Patient, Adult Children Patient Interpreter Needed:  Spanish Criminal Activity/Legal Involvement Pertinent to Current Situation/Hospitalization:  No - Comment as needed Significant Relationships:  Adult Children Lives with:  Self, Facility Resident Do you feel safe going back to the place where you live?  Yes Need for family participation in patient care:  Yes (Comment)  Care giving concerns:  Patient is currently at [facility] for STR.    Social Worker assessment / plan:  Holiday representative (CSW) reviewed chart and noted that patient is from [facility]. Per [facility] liaison patient is at [facility] for short term rehab and went to a podiatry appointment today and was sent to [facility] from the appointment. Per [facility] Myers patient can return to [facility] when stable. CSW met with patient and his daughter/ HPOA [facility] [phone] was at bedside. CSW utilized a Administrator, sports. Per patient he has been at [facility] for STR and is okay with returning to [facility] from [facility]. Daughter is also in agreement with patient returning to [facility] from [facility] when medically stable. CSW will continue to follow and assist as needed.    Employment status:  Temporary Insurance information:  Medicare PT Recommendations:  Not assessed at this time Information / Referral to community resources:  [place]  Patient/Family's Response to care:  Patient and daughter prefer for patient to return to [facility] and continue his rehab.   Patient/Family's Understanding of and Emotional Response to Diagnosis, Current Treatment, and Prognosis:  Patient and daughter were very pleasant and thanked CSW for visit.   Emotional Assessment Appearance:  Appears stated age Attitude/Demeanor/Rapport:    Affect (typically observed):  Accepting, Adaptable, Pleasant Orientation:  Oriented to Self, Oriented to Place, Oriented to  Time, Oriented to Situation Alcohol / Substance use:  Not Applicable Psych involvement (Current and /or in the community):  No (Comment)  Discharge Needs  Concerns to be addressed:  Discharge Planning Concerns Readmission within the last 30 days:  Yes Current discharge risk:  Dependent with Mobility Barriers to Discharge:  Continued Medical Work up   [employer], Veronia Beets, LCSW 07/20/2016, 10:13 AM

## 2016-07-20 NOTE — Progress Notes (Signed)
Patient well known to me. He is status post extensive right femoral endarterectomy with core matrix patch angioplasty for revascularization. He is admitted today with increasing pain and conversion of his dry gangrenous toes to wet infection. The plan is for toe amputation. Also noted some drainage at the groin incision site and I am asked to evaluate.  The groin does not appear infected there appears to be some superficial maceration.  Plan: I would patent the incision with Betadine twice a day and cover with a dry gauze dressing. The patient is currently on antibiotics for his foot infection and in the event that his groin is more severely affected than it appears these antibiotics would be appropriate coverage as well.

## 2016-07-20 NOTE — Care Management Note (Addendum)
Case Management Note  Patient Details  Name: Eddie Myers MRN: [id number] Date of Birth: 12/25/1955  Subjective/Objective:  Patient from [facility]. Admitted directly from podiatrist office due to gangrene to bilateral toes. Per last visit, patient is active with  [facility]. PCP Deloris PingLeslie Sharpe. [facility]. A chronic HD patient. [address] [place] Dimas ChyleAmanda Morris HD liaison aware of admission.                Action/Plan: [facility] to [facility]. Scheduled for amputation tonight  Expected Discharge Date:  07/23/16               Expected Discharge Plan:     In-House Referral:     Discharge planning Services     Post Acute Care Choice:    Choice offered to:     DME Arranged:    DME Agency:     HH Arranged:    HH Agency:     Status of Service:     If discussed at [employer] Meetings, dates discussed:    Additional Comments:  Marily MemosLisa M Shanon Becvar, RN 07/20/2016, 3:04 PM

## 2016-07-20 NOTE — Progress Notes (Signed)
Pharmacy Antibiotic Note  Eddie Myers is a 61 y.o. male admitted on 07/20/2016 with Gangrenous changes on toes.  Pharmacy has been consulted for Zosyn dosing. Patient with ESRD on 3 times a week HD.  Plan: Zosyn 3.375g IV q12h EI     No data recorded.   Recent Labs Lab 07/20/16 1006  WBC 5.9  CREATININE 5.65*  LATICACIDVEN 0.9    CrCl cannot be calculated (Unknown ideal weight.).    No Known Allergies  Antimicrobials this admission: Zosyn 5/21 >>   Thank you for allowing pharmacy to be a part of this patient's care.  Clovia CuffLisa Kinsey Karch, PharmD, BCPS 07/20/2016 1:32 PM

## 2016-07-20 NOTE — Progress Notes (Signed)
Patient is a direct admit from Dr. Dory Larsenline's office. Dressing to bilat feet are intact. Patient is spanish speaking only. Translator in room during admit. HR 140's. Notified Dr. Elpidio AnisSudini. Gave orders for bolus. Oriented to room, call light, TV and bed controls. Bed alarm on for safety. Incision to right groin area, some puss noted. Daughter at bedside, also only spanish speaking.

## 2016-07-20 NOTE — Consult Note (Signed)
Reason for Consult: Gangrenous changes on toes of both feet. Referring Physician: Ryne Mctigue is an 61 y.o. male.  HPI: This is a 61 year old male with history of severe peripheral vascular disease with extensive recent hospitalization having had revascularization procedures on both lower extremities. Dry gangrenous changes were present and the patient was stable for discharge to allow for demarcation and see if there were any signs of healing. Was seen outpatient today and there is some significant drainage and patient was readmitted for amputation of the gangrenous toes.  Past Medical History:  Diagnosis Date  . Anginal pain ([place])   . Coronary artery disease   . Diabetes mellitus without complication ([place])   . Dialysis patient ([place])    Tues, Thurs, Sat  . Dyspnea    with exertion  . Elevated lipids   . Enlarged prostate   . ESRD (end stage renal disease) ([place])    dialysis M-W-F  . Hypertension   . Myocardial infarction ([place]) 08/2015  . Renal insufficiency   . Stroke [facility])    Lt side this year July    Past Surgical History:  Procedure Laterality Date  . ABDOMINAL AORTOGRAM W/LOWER EXTREMITY Left 06/26/2016   Procedure: Abdominal Aortogram w/Lower Extremity;  Surgeon: Katha Cabal, MD;  Location: [place] CV LAB;  Service: Cardiovascular;  Laterality: Left;  . AV FISTULA PLACEMENT Left 01/10/2016   Procedure: INSERTION OF ARTERIOVENOUS (AV) GORE-TEX GRAFT ARM ( BRACH / AXILLARY GRAFT );  Surgeon: Katha Cabal, MD;  Location: [facility] ORS;  Service: Vascular;  Laterality: Left;  . CARDIAC CATHETERIZATION    . CORONARY ARTERY BYPASS GRAFT  01/2014   [facility] (LIMA -> LAD and SVG -> OM)  . INSERTION OF DIALYSIS CATHETER Right    Perma catheter  . LOWER EXTREMITY ANGIOGRAPHY  07/03/2016   Procedure: Lower Extremity Angiography;  Surgeon: Katha Cabal, MD;  Location: [place] CV LAB;  Service: Cardiovascular;;  . LOWER EXTREMITY INTERVENTION  07/03/2016    Procedure: Lower Extremity Intervention;  Surgeon: Katha Cabal, MD;  Location: [place] CV LAB;  Service: Cardiovascular;;  . PERIPHERAL VASCULAR BALLOON ANGIOPLASTY Left 07/01/2016   Procedure: Peripheral Vascular Balloon Angioplasty;  Surgeon: Katha Cabal, MD;  Location: [place] CV LAB;  Service: Cardiovascular;  Laterality: Left;  . PERIPHERAL VASCULAR BALLOON ANGIOPLASTY N/A 07/03/2016   Procedure: Peripheral Vascular Balloon Angioplasty;  Surgeon: Katha Cabal, MD;  Location: [place] CV LAB;  Service: Cardiovascular;  Laterality: N/A;  . PERIPHERAL VASCULAR CATHETERIZATION N/A 01/17/2016   Procedure: Thrombectomy;  Surgeon: Katha Cabal, MD;  Location: [place] CV LAB;  Service: Cardiovascular;  Laterality: N/A;  . PERIPHERAL VASCULAR CATHETERIZATION Left 01/28/2016   Procedure: A/V Fistulagram;  Surgeon: Katha Cabal, MD;  Location: [place] CV LAB;  Service: Cardiovascular;  Laterality: Left;  . PERIPHERAL VASCULAR CATHETERIZATION N/A 03/24/2016   Procedure: Dialysis/Perma Catheter Removal;  Surgeon: Katha Cabal, MD;  Location: [place] CV LAB;  Service: Cardiovascular;  Laterality: N/A;  . UPPER EXTREMITY ANGIOGRAM Left 01/28/2016   Procedure: Upper Extremity Angiogram;  Surgeon: Katha Cabal, MD;  Location: [place] CV LAB;  Service: Cardiovascular;  Laterality: Left;    Family History  Problem Relation Age of Onset  . Cancer Mother   . Diabetes Mother   . Diabetes Father     Social History:  reports that he quit smoking about 18 months ago. He has a 22.50 pack-year smoking history. He  has never used smokeless tobacco. He reports that he does not drink alcohol or use drugs.  Allergies: No Known Allergies  Medications:  Scheduled: . atorvastatin  80 mg Oral QPM  . ferric citrate  420 mg Oral TID WC  . insulin aspart  0-5 Units Subcutaneous QHS  . insulin aspart  0-9 Units Subcutaneous TID WC  .  ipratropium-albuterol  3 mL Nebulization Q6H  . potassium chloride  40 mEq Oral Q4H  . tamsulosin  0.4 mg Oral QHS    Results for orders placed or performed during the hospital encounter of 07/20/16 (from the past 48 hour(s))  CBC with Differential/Platelet     Status: Abnormal   Collection Time: 07/20/16 10:06 AM  Result Value Ref Range   WBC 5.9 3.8 - 10.6 K/uL   RBC 3.36 (L) 4.40 - 5.90 MIL/uL   Hemoglobin 9.9 (L) 13.0 - 18.0 g/dL   HCT 29.4 (L) 40.0 - 52.0 %   MCV 87.5 80.0 - 100.0 fL   MCH 29.5 26.0 - 34.0 pg   MCHC 33.8 32.0 - 36.0 g/dL   RDW 17.0 (H) 11.5 - 14.5 %   Platelets 287 150 - 440 K/uL   Neutrophils Relative % 74 %   Neutro Abs 4.3 1.4 - 6.5 K/uL   Lymphocytes Relative 13 %   Lymphs Abs 0.8 (L) 1.0 - 3.6 K/uL   Monocytes Relative 9 %   Monocytes Absolute 0.5 0.2 - 1.0 K/uL   Eosinophils Relative 3 %   Eosinophils Absolute 0.2 0 - 0.7 K/uL   Basophils Relative 1 %   Basophils Absolute 0.1 0 - 0.1 K/uL  Comprehensive metabolic panel     Status: Abnormal   Collection Time: 07/20/16 10:06 AM  Result Value Ref Range   Sodium 133 (L) 135 - 145 mmol/L   Potassium 3.3 (L) 3.5 - 5.1 mmol/L   Chloride 95 (L) 101 - 111 mmol/L   CO2 28 22 - 32 mmol/L   Glucose, Bld 131 (H) 65 - 99 mg/dL   BUN 27 (H) 6 - 20 mg/dL   Creatinine, Ser 5.65 (H) 0.61 - 1.24 mg/dL   Calcium 8.4 (L) 8.9 - 10.3 mg/dL   Total Protein 7.4 6.5 - 8.1 g/dL   Albumin 2.8 (L) 3.5 - 5.0 g/dL   AST 23 15 - 41 U/L   ALT 17 17 - 63 U/L   Alkaline Phosphatase 109 38 - 126 U/L   Total Bilirubin 0.6 0.3 - 1.2 mg/dL   GFR calc non Af Amer 10 (L) >60 mL/min   GFR calc Af Amer 11 (L) >60 mL/min    Comment: (NOTE) The eGFR has been calculated using the CKD EPI equation. This calculation has not been validated in all clinical situations. eGFR's persistently <60 mL/min signify possible Chronic Kidney Disease.    Anion gap 10 5 - 15  Lactic acid, plasma     Status: None   Collection Time: 07/20/16 10:06  AM  Result Value Ref Range   Lactic Acid, Venous 0.9 0.5 - 1.9 mmol/L  Glucose, capillary     Status: Abnormal   Collection Time: 07/20/16 11:09 AM  Result Value Ref Range   Glucose-Capillary 128 (H) 65 - 99 mg/dL  Protime-INR     Status: Abnormal   Collection Time: 07/20/16 11:15 AM  Result Value Ref Range   Prothrombin Time 36.3 (H) 11.4 - 15.2 seconds   INR 3.54     No results found.  Review of  Systems  Constitutional: Negative for chills and fever.  HENT: Negative.   Eyes: Negative.   Respiratory: Negative.   Cardiovascular: Negative.   Gastrointestinal: Negative for nausea and vomiting.  Genitourinary: Negative.   Musculoskeletal:       Pain in the gangrenous toes on both feet as well as some around his right groin area.  Skin:       Relates start discoloration with some drainage and toes on both feet. Also some drainage from the surgical site in his right groin.  Neurological: Negative.   Endo/Heme/Allergies: Negative.   Psychiatric/Behavioral: Negative.    There were no vitals taken for this visit. Physical Exam  Cardiovascular:  DP and PT pulses are not clearly palpated bilateral.  Musculoskeletal:  Guarded range of motion in the pedal joints. Muscle testing deferred.  Neurological:  Protective threshold monofilament wire and proprioception both. Be grossly intact.  Skin:  The skin is cool dry and atrophic with some cyanotic discoloration in the feet distally. Black gangrenous changes are noted to the distal aspect of the left great toe and right second toe with some mild drainage. No a sending cellulitis. The gangrenous areas distally are well demarcated.    Assessment/Plan: Assessment: 1. Gangrenous changes left great toe and right second toe. 2. Peripheral vascular disease, severe. 3. Diabetes  Plan: Discussed with the patient through an interpreter the need for amputation of the gangrenous toes. Discussed with the patient that the main potential complication  is inability to heal due to his vascular status and diabetes which may require a more proximal amputation. We will obtain a consent form for amputation of the left first and right second toes. Patient is currently nothing by mouth. Nurse related that the patient is in atrial fibrillation and patient will have to be cleared by internal medicine. If cleared we will go ahead and proceed with amputation of the above-mentioned toes as scheduled tonight. I did speak with Dr. Delana Meyer  who agreed to go ahead and take the toes prior to any vascular reassessment.  Durward Fortes 07/20/2016, 1:10 PM

## 2016-07-21 LAB — COMPREHENSIVE METABOLIC PANEL
ALK PHOS: 115 U/L (ref 38–126)
ALT: 15 U/L — ABNORMAL LOW (ref 17–63)
ANION GAP: 8 (ref 5–15)
AST: 20 U/L (ref 15–41)
Albumin: 2.6 g/dL — ABNORMAL LOW (ref 3.5–5.0)
BUN: 33 mg/dL — ABNORMAL HIGH (ref 6–20)
CALCIUM: 8.3 mg/dL — AB (ref 8.9–10.3)
CHLORIDE: 99 mmol/L — AB (ref 101–111)
CO2: 27 mmol/L (ref 22–32)
Creatinine, Ser: 6.61 mg/dL — ABNORMAL HIGH (ref 0.61–1.24)
GFR, EST AFRICAN AMERICAN: 9 mL/min — AB (ref >60)
GFR, EST NON AFRICAN AMERICAN: 8 mL/min — AB (ref >60)
Glucose, Bld: 101 mg/dL — ABNORMAL HIGH (ref 65–99)
Potassium: 5 mmol/L (ref 3.5–5.1)
SODIUM: 134 mmol/L — AB (ref 135–145)
Total Bilirubin: 0.6 mg/dL (ref 0.3–1.2)
Total Protein: 7.1 g/dL (ref 6.5–8.1)

## 2016-07-21 LAB — CBC
HCT: 28.9 % — ABNORMAL LOW (ref 40.0–52.0)
Hemoglobin: 9.6 g/dL — ABNORMAL LOW (ref 13.0–18.0)
MCH: 29 pg (ref 26.0–34.0)
MCHC: 33.3 g/dL (ref 32.0–36.0)
MCV: 87.1 fL (ref 80.0–100.0)
PLATELETS: 304 10*3/uL (ref 150–440)
RBC: 3.32 MIL/uL — ABNORMAL LOW (ref 4.40–5.90)
RDW: 17.6 % — ABNORMAL HIGH (ref 11.5–14.5)
WBC: 7.3 10*3/uL (ref 3.8–10.6)

## 2016-07-21 LAB — GLUCOSE, CAPILLARY
GLUCOSE-CAPILLARY: 114 mg/dL — AB (ref 65–99)
GLUCOSE-CAPILLARY: 103 mg/dL — AB (ref 65–99)
GLUCOSE-CAPILLARY: 236 mg/dL — AB (ref 65–99)
Glucose-Capillary: 90 mg/dL (ref 65–99)

## 2016-07-21 LAB — HIV ANTIBODY (ROUTINE TESTING W REFLEX): HIV Screen 4th Generation wRfx: NONREACTIVE

## 2016-07-21 LAB — PROTIME-INR
INR: 4.2
PROTHROMBIN TIME: 41.6 s — AB (ref 11.4–15.2)

## 2016-07-21 NOTE — Progress Notes (Signed)
Post hd vitals 

## 2016-07-21 NOTE — Progress Notes (Signed)
  End of hd 

## 2016-07-21 NOTE — Progress Notes (Signed)
Post hd assessment 

## 2016-07-21 NOTE — Progress Notes (Signed)
Treatment initiated without issue

## 2016-07-21 NOTE — Progress Notes (Signed)
Pre hd 

## 2016-07-21 NOTE — Progress Notes (Signed)
Patient returned from dialysis. Report received. BS 236. VSS. IV ABX started. Lunch ordered.

## 2016-07-21 NOTE — Progress Notes (Signed)
   Subjective/Chief Complaint: Spoke with Dr. Allena KatzPatel about the patient's INR. Discussed that we will plan for surgery for amputation of the toes tomorrow evening after work.   Objective: Vital signs in last 24 hours: Temp:  [97.9 F (36.6 C)-99.1 F (37.3 C)] 98.9 F (37.2 C) (05/22 0951) Pulse Rate:  [82-153] 87 (05/22 1225) Resp:  [15-20] 16 (05/22 1225) BP: (112-148)/(67-94) 132/75 (05/22 1225) SpO2:  [91 %-98 %] 97 % (05/22 1225) Weight:  [76 kg (167 lb 8.8 oz)] 76 kg (167 lb 8.8 oz) (05/22 0951) Last BM Date: 07/20/16  Intake/Output from previous day: 05/21 0701 - 05/22 0700 In: 220 [P.O.:120; IV Piggyback:100] Out: 250 [Urine:250] Intake/Output this shift: No intake/output data recorded.  Foot not assessed  Lab Results:   Recent Labs  07/20/16 1006 07/21/16 0335  WBC 5.9 7.3  HGB 9.9* 9.6*  HCT 29.4* 28.9*  PLT 287 304   BMET  Recent Labs  07/20/16 1006 07/21/16 0335  NA 133* 134*  K 3.3* 5.0  CL 95* 99*  CO2 28 27  GLUCOSE 131* 101*  BUN 27* 33*  CREATININE 5.65* 6.61*  CALCIUM 8.4* 8.3*   PT/INR  Recent Labs  07/20/16 1115 07/21/16 0335  LABPROT 36.3* 41.6*  INR 3.54 4.20*   ABG No results for input(s): PHART, HCO3 in the last 72 hours.  Invalid input(s): PCO2, PO2  Studies/Results: No results found.  Anti-infectives: Anti-infectives    Start     Dose/Rate Route Frequency Ordered Stop   07/20/16 1030  piperacillin-tazobactam (ZOSYN) IVPB 3.375 g     3.375 g 12.5 mL/hr over 240 Minutes Intravenous Every 12 hours 07/20/16 1016        Assessment/Plan: s/p Procedure(s): AMPUTATION TOE LEFT GREAT TOE AND RIGHT SECOND TOE (Left) Assessment: Gangrene left great toe and right second toe. With peripheral vascular disease.   Plan: Plan for surgery tomorrow evening after work. Nothing by mouth after breakfast tomorrow.  LOS: 1 day    Ricci Barkerodd W Nile Dorning 07/21/2016

## 2016-07-21 NOTE — Progress Notes (Signed)
[facility]  ROUNDING NOTE   Subjective:   Seen and examined on hemodialysis. Tolerating treatment well. UF goal of 0.5 litre    HEMODIALYSIS FLOWSHEET:  Blood Flow Rate (mL/min): 400 mL/min Arterial Pressure (mmHg): -150 mmHg Venous Pressure (mmHg): 200 mmHg Transmembrane Pressure (mmHg): 40 mmHg Ultrafiltration Rate (mL/min): 330 mL/min Dialysate Flow Rate (mL/min): 600 ml/min Conductivity: Machine : 13.8 Conductivity: Machine : 13.8 Dialysis Fluid Bolus: Normal Saline Bolus Amount (mL): 250 mL Dialysate Change:  (3k)    Objective:  Vital signs in last 24 hours:  Temp:  [97.9 F (36.6 C)-99.1 F (37.3 C)] 98.9 F (37.2 C) (05/22 0951) Pulse Rate:  [82-153] 87 (05/22 1225) Resp:  [15-20] 16 (05/22 1225) BP: (112-148)/(67-94) 132/75 (05/22 1225) SpO2:  [91 %-98 %] 97 % (05/22 1225) Weight:  [76 kg (167 lb 8.8 oz)] 76 kg (167 lb 8.8 oz) (05/22 0951)  Weight change:  Filed Weights   07/20/16 1000 07/21/16 0951  Weight: 82 kg (180 lb 12.4 oz) 76 kg (167 lb 8.8 oz)    Intake/Output: I/O last 3 completed shifts: In: 220 [P.O.:120; IV Piggyback:100] Out: 250 [Urine:250]   Intake/Output this shift:  No intake/output data recorded.  Physical Exam: General: No acute distress  Head: Normocephalic, atraumatic. Moist oral mucosal membranes  Eyes: Anicteric  Neck: Supple, trachea midline  Lungs:  Clear to auscultation, normal effort  Heart: irregular  Abdomen:  Soft, nontender,    Extremities: No peripheral edema. Gangrene left 1st, 2nd and 3rd toe, right 2nd toe  Neurologic: Nonfocal, moving all four extremities  Skin: No lesions  Access: LUE AVF    Basic Metabolic Panel:  Recent Labs Lab 07/20/16 1006 07/21/16 0335  NA 133* 134*  K 3.3* 5.0  CL 95* 99*  CO2 28 27  GLUCOSE 131* 101*  BUN 27* 33*  CREATININE 5.65* 6.61*  CALCIUM 8.4* 8.3*    Liver Function Tests:  Recent Labs Lab 07/20/16 1006 07/21/16 0335  AST 23 20  ALT 17 15*   ALKPHOS 109 115  BILITOT 0.6 0.6  PROT 7.4 7.1  ALBUMIN 2.8* 2.6*   No results for input(s): LIPASE, AMYLASE in the last 168 hours. No results for input(s): AMMONIA in the last 168 hours.  CBC:  Recent Labs Lab 07/20/16 1006 07/21/16 0335  WBC 5.9 7.3  NEUTROABS 4.3  --   HGB 9.9* 9.6*  HCT 29.4* 28.9*  MCV 87.5 87.1  PLT 287 304    Cardiac Enzymes: No results for input(s): CKTOTAL, CKMB, CKMBINDEX, TROPONINI in the last 168 hours.  BNP: Invalid input(s): POCBNP  CBG:  Recent Labs Lab 07/20/16 1109 07/20/16 1624 07/20/16 2039 07/20/16 2233 07/21/16 0730  GLUCAP 128* 103* 147* 138* 114*    Microbiology: Results for orders placed or performed during the hospital encounter of 06/24/16  MRSA PCR Screening     Status: None   Collection Time: 06/24/16  5:05 AM  Result Value Ref Range Status   MRSA by PCR NEGATIVE NEGATIVE Final    Comment:        The GeneXpert MRSA Assay (FDA approved for NASAL specimens only), is one component of a comprehensive MRSA colonization surveillance program. It is not intended to diagnose MRSA infection nor to guide or monitor treatment for MRSA infections.   CULTURE, BLOOD (ROUTINE X 2) w Reflex to ID Panel     Status: None   Collection Time: 06/27/16  7:22 AM  Result Value Ref Range Status   Specimen Description  BLOOD RIGHT HAND  Final   Special Requests   Final    BOTTLES DRAWN AEROBIC AND ANAEROBIC Blood Culture adequate volume   Culture NO GROWTH 5 DAYS  Final   Report Status 07/02/2016 FINAL  Final  CULTURE, BLOOD (ROUTINE X 2) w Reflex to ID Panel     Status: None   Collection Time: 06/27/16  7:29 AM  Result Value Ref Range Status   Specimen Description BLOOD RIGHT ASSIST CONTROL  Final   Special Requests   Final    BOTTLES DRAWN AEROBIC AND ANAEROBIC Blood Culture adequate volume   Culture NO GROWTH 5 DAYS  Final   Report Status 07/02/2016 FINAL  Final    Coagulation Studies:  Recent Labs  07/20/16 1115  07/21/16 0335  LABPROT 36.3* 41.6*  INR 3.54 4.20*    Urinalysis: No results for input(s): COLORURINE, LABSPEC, PHURINE, GLUCOSEU, HGBUR, BILIRUBINUR, KETONESUR, PROTEINUR, UROBILINOGEN, NITRITE, LEUKOCYTESUR in the last 72 hours.  Invalid input(s): APPERANCEUR    Imaging: No results found.   Medications:   . piperacillin-tazobactam (ZOSYN)  IV Stopped (07/21/16 0049)   . atorvastatin  80 mg Oral QPM  . ferric citrate  420 mg Oral TID WC  . insulin aspart  0-5 Units Subcutaneous QHS  . insulin aspart  0-9 Units Subcutaneous TID WC  . metoprolol tartrate  12.5 mg Oral BID  . tamsulosin  0.4 mg Oral QHS   acetaminophen **OR** acetaminophen, ipratropium-albuterol, metoprolol tartrate, morphine injection, nitroGLYCERIN, ondansetron **OR** ondansetron (ZOFRAN) IV, oxyCODONE, polyethylene glycol, senna-docusate  Assessment/ Plan:  61 y.o. Hispanic male ESRD on HD TTS, coronary artery disease, diabetes mellitus type 2, hyperlipidemia, hypertension, history of CVA, anemia chronic kidney disease, secondary hyperparathyroidism who presents with gangrenous changes on toes of both feet.  [facility] TTS [address] Left AVF   1.  ESRD on HD TTS:  Seen and examined on hemodialysis. Tolerating treatment well.  Continue TTS schedule.    2.  Anemia chronic kidney disease:  Hemoglobin 9.6 - Epogen 4000 with dialysis.    3.  Secondary hyperparathyroidism.   - Auryxia with meals.   4. Peripheral arterial disease: scheduled for amputation when INR at goal.   5. Hypertension: blood pressure at goal.      LOS: 1 Chelsia Serres 5/22/201812:59 PM

## 2016-07-21 NOTE — Progress Notes (Signed)
Pre dialysis assessment 

## 2016-07-21 NOTE — Progress Notes (Signed)
Dialysis consent and POC reviewed with patient with translator. Allowed time for questions.

## 2016-07-21 NOTE — Progress Notes (Signed)
Per RN patient's INR is a barrier for surgery today. Patient is going to dialysis today. Clinical Child psychotherapistocial Worker (CSW) updated Physicist, medicalJoseph Peak liaison. CSW will continue to follow and assist as needed.   Baker Hughes IncorporatedBailey Graydon Fofana, LCSW [phone]

## 2016-07-21 NOTE — Progress Notes (Signed)
[facility] [place] [facility]   PATIENT NAME: Eddie Myers    MR#:  [id number]  DATE OF BIRTH:  1955-10-08  SUBJECTIVE:  Via Interpreter Patient came in as a direct admit from podiatry office for dry gangrene of left foot and planning for amputation Patient just returned back from hemodialysis denies any complaints. REVIEW OF SYSTEMS:   Review of Systems  Constitutional: Negative for chills, fever and weight loss.  HENT: Negative for ear discharge, ear pain and nosebleeds.   Eyes: Negative for blurred vision, pain and discharge.  Respiratory: Negative for sputum production, shortness of breath, wheezing and stridor.   Cardiovascular: Negative for chest pain, palpitations, orthopnea and PND.  Gastrointestinal: Negative for abdominal pain, diarrhea, nausea and vomiting.  Genitourinary: Negative for frequency and urgency.  Musculoskeletal: Negative for back pain and joint pain.  Neurological: Negative for sensory change, speech change, focal weakness and weakness.  Psychiatric/Behavioral: Negative for depression and hallucinations. The patient is not nervous/anxious.    Tolerating Diet:yes Tolerating PT: pending  DRUG ALLERGIES:  No Known Allergies  VITALS:  Blood pressure 124/67, pulse (!) 115, temperature 98.8 F (37.1 C), temperature source Oral, resp. rate 14, height 5\' 8"  (1.727 m), weight 76 kg (167 lb 8.8 oz), SpO2 96 %.  PHYSICAL EXAMINATION:   Physical Exam  GENERAL:  61 y.o.-year-old patient lying in the bed with no acute distress. Chronically ill EYES: Pupils equal, round, reactive to light and accommodation. No scleral icterus. Extraocular muscles intact.  HEENT: Head atraumatic, normocephalic. Oropharynx and nasopharynx clear.  NECK:  Supple, no jugular venous distention. No thyroid enlargement, no tenderness.  LUNGS: Normal breath sounds bilaterally, no wheezing, rales, rhonchi. No use of accessory muscles of respiration.   CARDIOVASCULAR: S1, S2 normal. No murmurs, rubs, or gallops.  ABDOMEN: Soft, nontender, nondistended. Bowel sounds present. No organomegaly or mass.  EXTREMITIES:bilateral severe gangrene of is toes NEUROLOGIC: Cranial nerves II through XII are intact. No focal Motor or sensory deficits b/l.   PSYCHIATRIC:  patient is alert and oriented x 3.  SKIN: No obvious rash, lesion, or ulcer.   LABORATORY PANEL:  CBC  Recent Labs Lab 07/21/16 0335  WBC 7.3  HGB 9.6*  HCT 28.9*  PLT 304    Chemistries   Recent Labs Lab 07/21/16 0335  NA 134*  K 5.0  CL 99*  CO2 27  GLUCOSE 101*  BUN 33*  CREATININE 6.61*  CALCIUM 8.3*  AST 20  ALT 15*  ALKPHOS 115  BILITOT 0.6   Cardiac Enzymes No results for input(s): TROPONINI in the last 168 hours. RADIOLOGY:  No results found. ASSESSMENT AND PLAN:  Eddie DraftsMauro Myers  is a 61 y.o. male with a known history of End-stage renal disease on hemodialysis Tuesday, Thursday and Saturday, diabetes, hypertension, hyperlipidemia, history of previous CVA, history of coronary artery disease status post bypass who presented to the hospital as a direct admit from podiatry office after his follow-up visit today as his dry gangrene of the left foot is becoming wet and podiatry is planning to do surgery today.   * Bilateral lower extremity cyanosis with necrotic toes -Today's PT/INR is 4.2 -Patient is reporting that his Coumadin is on hold --Cardiology has cleared the patient for surgery during previous admission  06/24/2016 .patient's recent nuclear stress test was normal -CT scan of the pelvis and lower extremity with postoperative changes but no acute bleed -resume Coumadin and aspirin 81 mg enteric-coated per vascular recomendations AFTER surgery  *right  groin incision with possible infection  Patient is started on IV Zosyn  Pain control with IV and oral pain medications Status post left lower extremity angiogram with angioplasty of left superficial  femoral, popliteal and peroneal arteries.s/p Right lower extremity angiogram - rt common femoral endarterectomy 07/04/16 with distal angioplasty .07/03/16   * Subacute right sided vision loss-patient had symptoms about 6 weeks ago.  -CT head shows a age-indeterminate right sided thalamic CV MRI shows old CVA  * Paroxysmal atrial fibrillation  Will hold Coumadin and check PT/INR   * End-stage renal disease on hemodialysis-patient gets dialysis on Tuesday Thursday Saturday.  -Consult nephrology for in house Hemodialysis  * h/o hypertension-continue lisinopril, carvedilol, Norvasc.  *h/o Diabetes type 2 without complication-patient's Lantus was discontinued during the previous admission for hypoglycemia , continue SSI,   * Hyperlipidemia-continue atorvastatin   Case discussed with Care Management/Social Worker. Management plans discussed with the patient, family and they are in agreement.  CODE STATUS: FULL  DVT Prophylaxis: Supratherapeutic INR  TOTAL TIME TAKING CARE OF THIS PATIENT: *30* minutes.  >50% time spent on counselling and coordination of care  POSSIBLE D/C IN 1-2 DAYS, DEPENDING ON CLINICAL CONDITION.  Note: This dictation was prepared with Dragon dictation along with smaller phrase technology. Any transcriptional errors that result from this process are unintentional.  Trey Bebee M.D on 07/21/2016 at 3:02 PM  Between 7am to 6pm - Pager - [phone]  After 6pm go to [email or web address] - password [email or web address]  [facility]  Office  [phone]  CC: Primary care physician; System, Pcp Not In

## 2016-07-21 NOTE — Progress Notes (Signed)
Patient PT and INR lab results were called to the Dr. No new orders given, will continue to monitor.

## 2016-07-21 NOTE — Progress Notes (Signed)
Patient sent to dialysis. A&O x4. VSS. Consents in chart. Graham crackers and peanut butter sent with patient.

## 2016-07-21 NOTE — Anesthesia Preprocedure Evaluation (Addendum)
Anesthesia Evaluation  Patient identified by MRN, date of birth, ID band Patient awake    Reviewed: Allergy & Precautions, H&P , NPO status , Patient's Chart, lab work & pertinent test results, reviewed documented beta blocker date and time   Airway Mallampati: II   Neck ROM: full    Dental  (+) Poor Dentition   Pulmonary neg pulmonary ROS, shortness of breath, former smoker,    Pulmonary exam normal        Cardiovascular hypertension, On Medications + angina with exertion + CAD, + Past MI and + Peripheral Vascular Disease  negative cardio ROS Normal cardiovascular examAtrial Fibrillation  Rhythm:regular Rate:Normal     Neuro/Psych CVA negative neurological ROS  negative psych ROS   GI/Hepatic negative GI ROS, Neg liver ROS,   Endo/Other  negative endocrine ROSdiabetes, Poorly Controlled, Type 1, Insulin Dependent  Renal/GU Renal diseasenegative Renal ROS  negative genitourinary   Musculoskeletal   Abdominal   Peds  Hematology negative hematology ROS (+)   Anesthesia Other Findings Past Medical History: No date: Anginal pain (HCC) No date: Coronary artery disease No date: Diabetes mellitus without complication (HCC) No date: Dialysis patient (HCC)     Comment: Tues, Thurs, Sat No date: Dyspnea     Comment: with exertion No date: Elevated lipids No date: Enlarged prostate No date: ESRD (end stage renal disease) (HCC)     Comment: dialysis M-W-F No date: Hypertension 08/2015: Myocardial infarction (HCC) No date: Renal insufficiency No date: Stroke [facility](HCC)     Comment: Lt side this year July  Past Surgical History: 06/26/2016: ABDOMINAL AORTOGRAM W/LOWER EXTREMITY Left     Comment: Procedure: Abdominal Aortogram w/Lower               Extremity;  Surgeon: Renford DillsGregory G Schnier, MD;                Location: [facility] INVASIVE CV LAB;  Service:               Cardiovascular;  Laterality: Left; 01/10/2016: AV FISTULA  PLACEMENT Left     Comment: Procedure: INSERTION OF ARTERIOVENOUS (AV)               GORE-TEX GRAFT ARM ( BRACH / AXILLARY GRAFT );               Surgeon: Renford DillsGregory G Schnier, MD;  Location: [facility]              ORS;  Service: Vascular;  Laterality: Left; No date: CARDIAC CATHETERIZATION 01/2014: CORONARY ARTERY BYPASS GRAFT     [facility] L[facility] and SVG -> OM) No date: INSERTION OF DIALYSIS CATHETER Right     Comment: Perma catheter 07/03/2016: LOWER EXTREMITY ANGIOGRAPHY     Comment: Procedure: Lower Extremity Angiography;                Surgeon: Renford DillsSchnier, Gregory G, MD;  Location:               [facility] INVASIVE CV LAB;  Service:               Cardiovascular;; 07/03/2016: LOWER EXTREMITY INTERVENTION     Comment: Procedure: Lower Extremity Intervention;                Surgeon: Renford DillsSchnier, Gregory G, MD;  Location:               [facility] INV[facility] CV LAB;  Service:  Cardiovascular;; 07/01/2016: PERIPHERAL VASCULAR BALLOON ANGIOPLASTY Left     Comment: Procedure: Peripheral Vascular Balloon               Angioplasty;  Surgeon: Renford Dills, MD;               Location: [facility] CV [facility];  Service:               Cardiovascular;  Laterality: Left; 07/03/2016: PERIPHERAL VASCULAR BALLOON ANGIOPLASTY N/A     Comment: Procedure: Peripheral Vascular Balloon               Angioplasty;  Surgeon: Renford Dills, MD;               Location: [facility] INV[facility] CV LAB;  Service:               Cardiovascular;  Laterality: N/A; 01/17/2016: PERIPHERAL VASCULAR CATHETERIZATION N/A     Comment: Procedure: Thrombectomy;  Surgeon: Renford Dills, MD;  Location: [facility] INV[facility] CV LAB;                Service: Cardiovascular;  Laterality: N/A; 01/28/2016: PERIPHERAL VASCULAR CATHETERIZATION Left     Comment: Procedure: A/V Fistulagram;  Surgeon: Renford Dills, MD;  Location: [facility] CV [facility];              Service: Cardiovascular;  Laterality:  Left; 03/24/2016: PERIPHERAL VASCULAR CATHETERIZATION N/A     Comment: Procedure: Dialysis/Perma Catheter Removal;                Surgeon: Renford Dills, MD;  Location: [facility]              INVASIVE CV LAB;  Service: Cardiovascular;                Laterality: N/A; 01/28/2016: UPPER EXTREMITY ANGIOGRAM Left     Comment: Procedure: Upper Extremity Angiogram;                Surgeon: Renford Dills, MD;  Location: [facility]              INVASIVE CV LAB;  Service: Cardiovascular;                Laterality: Left;  BMI    Body Mass Index:  25.48 kg/m      Reproductive/Obstetrics negative OB ROS                            Anesthesia Physical Anesthesia Plan  ASA: IV and emergent  Anesthesia Plan: General   Post-op Pain Management:    Induction:   Airway Management Planned:   Additional Equipment:   Intra-op Plan:   Post-operative Plan:   Informed Consent: I have reviewed the patients History and Physical, chart, labs and discussed the procedure including the risks, benefits and alternatives for the proposed anesthesia with the patient or authorized representative who has indicated his/her understanding and acceptance.   Dental Advisory Given  Plan Discussed with: CRNA  Anesthesia Plan Comments:         Anesthesia Quick Evaluation

## 2016-07-21 NOTE — Progress Notes (Signed)
Patient is spanish speaking only. A&O x4. Tolerated IV ABX and diet. Dressings to bilat foot. Dressing to groin changed this shift. Scabs scat throughout from scratching. Blood sugars 114, 103, and 236, received SS insulin as ordered. Oral pain meds with good relief. Tele in place, running NSR 80's-90's.

## 2016-07-22 ENCOUNTER — Inpatient Hospital Stay: Payer: Medicare Other | Admitting: Anesthesiology

## 2016-07-22 ENCOUNTER — Encounter
Admission: AD | Disposition: A | Discharge status: Skilled Nursing Facility | Payer: Self-pay | Source: Ambulatory Visit | Attending: Internal Medicine

## 2016-07-22 ENCOUNTER — Encounter (INDEPENDENT_AMBULATORY_CARE_PROVIDER_SITE_OTHER): Payer: Medicare Other | Admitting: Vascular Surgery

## 2016-07-22 ENCOUNTER — Ambulatory Visit: Payer: Medicare Other | Admitting: Nurse Practitioner

## 2016-07-22 HISTORY — PX: AMPUTATION TOE: SHX6595

## 2016-07-22 LAB — PROTIME-INR
INR: 3.13
Prothrombin Time: 32.9 seconds — ABNORMAL HIGH (ref 11.4–15.2)

## 2016-07-22 LAB — GLUCOSE, CAPILLARY
GLUCOSE-CAPILLARY: 108 mg/dL — AB (ref 65–99)
GLUCOSE-CAPILLARY: 141 mg/dL — AB (ref 65–99)
GLUCOSE-CAPILLARY: 111 mg/dL — AB (ref 65–99)
Glucose-Capillary: 104 mg/dL — ABNORMAL HIGH (ref 65–99)
Glucose-Capillary: 141 mg/dL — ABNORMAL HIGH (ref 65–99)

## 2016-07-22 SURGERY — AMPUTATION, TOE
Anesthesia: General | Site: Foot | Laterality: Bilateral | Wound class: Dirty or Infected

## 2016-07-22 MED ORDER — BUPIVACAINE HCL 0.5 % IJ SOLN
INTRAMUSCULAR | Status: DC | PRN
  Administered 2016-07-22: 15 mL

## 2016-07-22 MED ORDER — EPHEDRINE SULFATE 50 MG/ML IJ SOLN
INTRAMUSCULAR | Status: DC | PRN
  Administered 2016-07-22: 10 mg via INTRAVENOUS

## 2016-07-22 MED ORDER — BUPIVACAINE HCL (PF) 0.5 % IJ SOLN
INTRAMUSCULAR | Status: AC
  Filled 2016-07-22: qty 30

## 2016-07-22 MED ORDER — EPHEDRINE SULFATE 50 MG/ML IJ SOLN
INTRAMUSCULAR | Status: AC
  Filled 2016-07-22: qty 1

## 2016-07-22 MED ORDER — LIDOCAINE HCL (CARDIAC) 20 MG/ML IV SOLN
INTRAVENOUS | Status: DC | PRN
  Administered 2016-07-22: 80 mg via INTRAVENOUS

## 2016-07-22 MED ORDER — PHENYLEPHRINE HCL 10 MG/ML IJ SOLN
INTRAMUSCULAR | Status: DC | PRN
  Administered 2016-07-22 (×3): 100 ug via INTRAVENOUS
  Administered 2016-07-22: 150 ug via INTRAVENOUS

## 2016-07-22 MED ORDER — ONDANSETRON HCL 4 MG/2ML IJ SOLN: 4.0000 mg | Freq: Once | INTRAMUSCULAR | Status: DC | PRN

## 2016-07-22 MED ORDER — LIDOCAINE HCL (PF) 1 % IJ SOLN
INTRAMUSCULAR | Status: AC
  Filled 2016-07-22: qty 30

## 2016-07-22 MED ORDER — TRAZODONE HCL 50 MG PO TABS: 25.0000 mg | ORAL_TABLET | Freq: Every evening | ORAL | Status: DC | PRN

## 2016-07-22 MED ORDER — FENTANYL CITRATE (PF) 100 MCG/2ML IJ SOLN
INTRAMUSCULAR | Status: AC
  Filled 2016-07-22: qty 2

## 2016-07-22 MED ORDER — PROPOFOL 10 MG/ML IV BOLUS
INTRAVENOUS | Status: AC
  Filled 2016-07-22: qty 20

## 2016-07-22 MED ORDER — ONDANSETRON HCL 4 MG/2ML IJ SOLN
INTRAMUSCULAR | Status: AC
Start: 2016-07-22 — End: 2016-07-22
  Filled 2016-07-22: qty 2

## 2016-07-22 MED ORDER — PROPOFOL 10 MG/ML IV BOLUS
INTRAVENOUS | Status: DC | PRN
  Administered 2016-07-22: 80 mg via INTRAVENOUS

## 2016-07-22 MED ORDER — BUPIVACAINE-EPINEPHRINE (PF) 0.5% -1:200000 IJ SOLN
INTRAMUSCULAR | Status: AC
  Filled 2016-07-22: qty 30

## 2016-07-22 MED ORDER — NEOMYCIN-POLYMYXIN B GU 40-200000 IR SOLN
Status: DC | PRN
  Administered 2016-07-22: 2 mL

## 2016-07-22 MED ORDER — SODIUM CHLORIDE 0.9 % IV SOLN
INTRAVENOUS | Status: DC | PRN
  Administered 2016-07-22: 17:00:00 via INTRAVENOUS

## 2016-07-22 MED ORDER — FENTANYL CITRATE (PF) 100 MCG/2ML IJ SOLN: 25.0000 ug | INTRAMUSCULAR | Status: DC | PRN

## 2016-07-22 MED ORDER — FENTANYL CITRATE (PF) 100 MCG/2ML IJ SOLN
INTRAMUSCULAR | Status: DC | PRN
  Administered 2016-07-22 (×2): 50 ug via INTRAVENOUS

## 2016-07-22 MED ORDER — NEOMYCIN-POLYMYXIN B GU 40-200000 IR SOLN
Status: AC
  Filled 2016-07-22: qty 2

## 2016-07-22 SURGICAL SUPPLY — 50 items
BANDAGE ACE 4X5 VEL STRL LF (GAUZE/BANDAGES/DRESSINGS) ×3 IMPLANT
BANDAGE CONFORM 2X5YD N/S (GAUZE/BANDAGES/DRESSINGS) ×6 IMPLANT
BANDAGE ELASTIC 4 CLIP NS LF (GAUZE/BANDAGES/DRESSINGS) ×3 IMPLANT
BANDAGE ELASTIC 4 LF NS (GAUZE/BANDAGES/DRESSINGS) ×3 IMPLANT
BLADE MED AGGRESSIVE (BLADE) ×3 IMPLANT
BLADE OSC/SAGITTAL MD 5.5X18 (BLADE) ×3 IMPLANT
BLADE SURG 15 STRL LF DISP TIS (BLADE) ×2 IMPLANT
BLADE SURG 15 STRL SS (BLADE) ×4
BLADE SURG MINI STRL (BLADE) ×3 IMPLANT
BNDG ESMARK 4X12 TAN STRL LF (GAUZE/BANDAGES/DRESSINGS) ×3 IMPLANT
BNDG GAUZE 4.5X4.1 6PLY STRL (MISCELLANEOUS) ×3 IMPLANT
CANISTER SUCT 1200ML W/VALVE (MISCELLANEOUS) ×3 IMPLANT
CLOSURE WOUND 1/4X4 (GAUZE/BANDAGES/DRESSINGS) ×1
CUFF TOURN 18 STER (MISCELLANEOUS) IMPLANT
CUFF TOURN DUAL PL 12 NO SLV (MISCELLANEOUS) ×6 IMPLANT
DRAPE FLUOR MINI C-ARM 54X84 (DRAPES) ×3 IMPLANT
DURAPREP 26ML APPLICATOR (WOUND CARE) ×3 IMPLANT
ELECT REM PT RETURN 9FT ADLT (ELECTROSURGICAL) ×3
ELECTRODE REM PT RTRN 9FT ADLT (ELECTROSURGICAL) ×1 IMPLANT
GAUZE PETRO XEROFOAM 1X8 (MISCELLANEOUS) ×3 IMPLANT
GAUZE SPONGE 4X4 12PLY STRL (GAUZE/BANDAGES/DRESSINGS) ×6 IMPLANT
GAUZE STRETCH 2X75IN STRL (MISCELLANEOUS) ×3 IMPLANT
GAUZE XEROFORM 4X4 STRL (GAUZE/BANDAGES/DRESSINGS) ×6 IMPLANT
GLOVE BIO SURGEON STRL SZ7.5 (GLOVE) ×3 IMPLANT
GLOVE INDICATOR 8.0 STRL GRN (GLOVE) ×3 IMPLANT
GOWN STRL REUS W/ TWL LRG LVL3 (GOWN DISPOSABLE) ×2 IMPLANT
GOWN STRL REUS W/TWL LRG LVL3 (GOWN DISPOSABLE) ×4
HANDPIECE VERSAJET DEBRIDEMENT (MISCELLANEOUS) IMPLANT
KIT RM TURNOVER STRD PROC AR (KITS) ×3 IMPLANT
LABEL OR SOLS (LABEL) ×3 IMPLANT
NEEDLE FILTER BLUNT 18X 1/2SAF (NEEDLE) ×2
NEEDLE FILTER BLUNT 18X1 1/2 (NEEDLE) ×1 IMPLANT
NEEDLE HYPO 25X1 1.5 SAFETY (NEEDLE) ×6 IMPLANT
NS IRRIG 500ML POUR BTL (IV SOLUTION) ×6 IMPLANT
PACK EXTREMITY ARMC (MISCELLANEOUS) ×3 IMPLANT
SOL .9 NS 3000ML IRR  AL (IV SOLUTION) ×2
SOL .9 NS 3000ML IRR UROMATIC (IV SOLUTION) ×1 IMPLANT
SOL PREP PVP 2OZ (MISCELLANEOUS) ×3
SOLUTION PREP PVP 2OZ (MISCELLANEOUS) ×1 IMPLANT
SPONGE XRAY 4X4 16PLY STRL (MISCELLANEOUS) ×3 IMPLANT
STOCKINETTE STRL 6IN 960660 (GAUZE/BANDAGES/DRESSINGS) ×3 IMPLANT
STRIP CLOSURE SKIN 1/4X4 (GAUZE/BANDAGES/DRESSINGS) ×2 IMPLANT
SUT ETHILON 3-0 FS-10 30 BLK (SUTURE) ×3
SUT ETHILON 3-0 KS 30 BLK (SUTURE) ×6 IMPLANT
SUT ETHILON 4-0 (SUTURE)
SUT ETHILON 4-0 FS2 18XMFL BLK (SUTURE)
SUTURE EHLN 3-0 FS-10 30 BLK (SUTURE) ×1 IMPLANT
SUTURE ETHLN 4-0 FS2 18XMF BLK (SUTURE) IMPLANT
SWAB DUAL CULTURE TRANS RED ST (MISCELLANEOUS) ×3 IMPLANT
SYRINGE 10CC LL (SYRINGE) ×3 IMPLANT

## 2016-07-22 NOTE — Progress Notes (Signed)
Dressing changed to right groin.

## 2016-07-22 NOTE — Anesthesia Post-op Follow-up Note (Cosign Needed)
Anesthesia QCDR form completed.        

## 2016-07-22 NOTE — Progress Notes (Signed)
Patient up to OR via room bed. Last BS 111. Report given. VSS. Last pain med given at 1545, with good relief. Dressing changed to right groin area. Daughter at bedside. Consent sent with patient in chart.

## 2016-07-22 NOTE — Interval H&P Note (Signed)
History and Physical Interval Note:  07/22/2016 5:10 PM  Eddie Myers  has presented today for surgery, with the diagnosis of n/a  The various methods of treatment have been discussed with the patient and family. After consideration of risks, benefits and other options for treatment, the patient has consented to  Procedure(s): AMPUTATION TOE LEFT GREAT TOE AND RIGHT 2ND TOE (Left) as a surgical intervention .  The patient's history has been reviewed, patient examined, no change in status, stable for surgery.  I have reviewed the patient's chart and labs.  Questions were answered to the patient's satisfaction.     Ricci Barkerodd W Eliott Amparan

## 2016-07-22 NOTE — Transfer of Care (Signed)
Immediate Anesthesia Transfer of Care Note  Patient: Eddie Myers DraftsMauro Neisen  Procedure(s) Performed: Procedure(s): AMPUTATION TOE LEFT GREAT TOE AND RIGHT 2ND TOE (Bilateral)  Patient Location: PACU  Anesthesia Type:General  Level of Consciousness: sedated  Airway & Oxygen Therapy: Patient connected to face mask oxygen  Post-op Assessment: Post -op Vital signs reviewed and stable  Post vital signs: stable  Last Vitals:  Vitals:   07/22/16 1527 07/22/16 1835  BP: 128/64 108/63  Pulse: 92 83  Resp: 18 (!) 23  Temp:  36.7 C    Last Pain:  Vitals:   07/22/16 1835  TempSrc: Temporal  PainSc:          Complications: No apparent anesthesia complications

## 2016-07-22 NOTE — H&P (View-Only) (Signed)
Reason for Consult: Gangrenous changes on toes of both feet. Referring Physician: Gouru  Nahum Gruenewald is an 61 y.o. male.  HPI: This is a 61-year-old male with history of severe peripheral vascular disease with extensive recent hospitalization having had revascularization procedures on both lower extremities. Dry gangrenous changes were present and the patient was stable for discharge to allow for demarcation and see if there were any signs of healing. Was seen outpatient today and there is some significant drainage and patient was readmitted for amputation of the gangrenous toes.  Past Medical History:  Diagnosis Date  . Anginal pain (HCC)   . Coronary artery disease   . Diabetes mellitus without complication (HCC)   . Dialysis patient (HCC)    Tues, Thurs, Sat  . Dyspnea    with exertion  . Elevated lipids   . Enlarged prostate   . ESRD (end stage renal disease) (HCC)    dialysis M-W-F  . Hypertension   . Myocardial infarction (HCC) 08/2015  . Renal insufficiency   . Stroke (HCC)    Lt side this year July    Past Surgical History:  Procedure Laterality Date  . ABDOMINAL AORTOGRAM W/LOWER EXTREMITY Left 06/26/2016   Procedure: Abdominal Aortogram w/Lower Extremity;  Surgeon: Gregory G Schnier, MD;  Location: [facility];  Service: Cardiovascular;  Laterality: Left;  . AV FISTULA PLACEMENT Left 01/10/2016   Procedure: INSERTION OF ARTERIOVENOUS (AV) GORE-TEX GRAFT ARM ( BRACH / AXILLARY GRAFT );  Surgeon: Gregory G Schnier, MD;  Location: [facility] ORS;  Service: Vascular;  Laterality: Left;  . CARDIAC CATHETERIZATION    . CORONARY ARTERY BYPASS GRAFT  01/2014   [facility] (LIMA -> LAD and SVG -> OM)  . INSERTION OF DIALYSIS CATHETER Right    Perma catheter  . LOWER EXTREMITY ANGIOGRAPHY  07/03/2016   Procedure: Lower Extremity Angiography;  Surgeon: Schnier, Gregory G, MD;  Location: [facility];  Service: Cardiovascular;;  . LOWER EXTREMITY INTERVENTION  07/03/2016    Procedure: Lower Extremity Intervention;  Surgeon: Schnier, Gregory G, MD;  Location: [facility];  Service: Cardiovascular;;  . PERIPHERAL VASCULAR BALLOON ANGIOPLASTY Left 07/01/2016   Procedure: Peripheral Vascular Balloon Angioplasty;  Surgeon: Schnier, Gregory G, MD;  Location: [facility];  Service: Cardiovascular;  Laterality: Left;  . PERIPHERAL VASCULAR BALLOON ANGIOPLASTY N/A 07/03/2016   Procedure: Peripheral Vascular Balloon Angioplasty;  Surgeon: Schnier, Gregory G, MD;  Location: [facility];  Service: Cardiovascular;  Laterality: N/A;  . PERIPHERAL VASCULAR CATHETERIZATION N/A 01/17/2016   Procedure: Thrombectomy;  Surgeon: Gregory G Schnier, MD;  Location: [facility];  Service: Cardiovascular;  Laterality: N/A;  . PERIPHERAL VASCULAR CATHETERIZATION Left 01/28/2016   Procedure: A/V Fistulagram;  Surgeon: Gregory G Schnier, MD;  Location: [facility];  Service: Cardiovascular;  Laterality: Left;  . PERIPHERAL VASCULAR CATHETERIZATION N/A 03/24/2016   Procedure: Dialysis/Perma Catheter Removal;  Surgeon: Gregory G Schnier, MD;  Location: [facility];  Service: Cardiovascular;  Laterality: N/A;  . UPPER EXTREMITY ANGIOGRAM Left 01/28/2016   Procedure: Upper Extremity Angiogram;  Surgeon: Gregory G Schnier, MD;  Location: [facility];  Service: Cardiovascular;  Laterality: Left;    Family History  Problem Relation Age of Onset  . Cancer Mother   . Diabetes Mother   . Diabetes Father     Social History:  reports that he quit smoking about 18 months ago. He has a 22.50 pack-year smoking history. He   has never used smokeless tobacco. He reports that he does not drink alcohol or use drugs.  Allergies: No Known Allergies  Medications:  Scheduled: . atorvastatin  80 mg Oral QPM  . ferric citrate  420 mg Oral TID WC  . insulin aspart  0-5 Units Subcutaneous QHS  . insulin aspart  0-9 Units Subcutaneous TID WC  .  ipratropium-albuterol  3 mL Nebulization Q6H  . potassium chloride  40 mEq Oral Q4H  . tamsulosin  0.4 mg Oral QHS    Results for orders placed or performed during the hospital encounter of 07/20/16 (from the past 48 hour(s))  CBC with Differential/Platelet     Status: Abnormal   Collection Time: 07/20/16 10:06 AM  Result Value Ref Range   WBC 5.9 3.8 - 10.6 K/uL   RBC 3.36 (L) 4.40 - 5.90 MIL/uL   Hemoglobin 9.9 (L) 13.0 - 18.0 g/dL   HCT 29.4 (L) 40.0 - 52.0 %   MCV 87.5 80.0 - 100.0 fL   MCH 29.5 26.0 - 34.0 pg   MCHC 33.8 32.0 - 36.0 g/dL   RDW 17.0 (H) 11.5 - 14.5 %   Platelets 287 150 - 440 K/uL   Neutrophils Relative % 74 %   Neutro Abs 4.3 1.4 - 6.5 K/uL   Lymphocytes Relative 13 %   Lymphs Abs 0.8 (L) 1.0 - 3.6 K/uL   Monocytes Relative 9 %   Monocytes Absolute 0.5 0.2 - 1.0 K/uL   Eosinophils Relative 3 %   Eosinophils Absolute 0.2 0 - 0.7 K/uL   Basophils Relative 1 %   Basophils Absolute 0.1 0 - 0.1 K/uL  Comprehensive metabolic panel     Status: Abnormal   Collection Time: 07/20/16 10:06 AM  Result Value Ref Range   Sodium 133 (L) 135 - 145 mmol/L   Potassium 3.3 (L) 3.5 - 5.1 mmol/L   Chloride 95 (L) 101 - 111 mmol/L   CO2 28 22 - 32 mmol/L   Glucose, Bld 131 (H) 65 - 99 mg/dL   BUN 27 (H) 6 - 20 mg/dL   Creatinine, Ser 5.65 (H) 0.61 - 1.24 mg/dL   Calcium 8.4 (L) 8.9 - 10.3 mg/dL   Total Protein 7.4 6.5 - 8.1 g/dL   Albumin 2.8 (L) 3.5 - 5.0 g/dL   AST 23 15 - 41 U/L   ALT 17 17 - 63 U/L   Alkaline Phosphatase 109 38 - 126 U/L   Total Bilirubin 0.6 0.3 - 1.2 mg/dL   GFR calc non Af Amer 10 (L) >60 mL/min   GFR calc Af Amer 11 (L) >60 mL/min    Comment: (NOTE) The eGFR has been calculated using the CKD EPI equation. This calculation has not been validated in all clinical situations. eGFR's persistently <60 mL/min signify possible Chronic Kidney Disease.    Anion gap 10 5 - 15  Lactic acid, plasma     Status: None   Collection Time: 07/20/16 10:06  AM  Result Value Ref Range   Lactic Acid, Venous 0.9 0.5 - 1.9 mmol/L  Glucose, capillary     Status: Abnormal   Collection Time: 07/20/16 11:09 AM  Result Value Ref Range   Glucose-Capillary 128 (H) 65 - 99 mg/dL  Protime-INR     Status: Abnormal   Collection Time: 07/20/16 11:15 AM  Result Value Ref Range   Prothrombin Time 36.3 (H) 11.4 - 15.2 seconds   INR 3.54     No results found.  Review of   Systems  Constitutional: Negative for chills and fever.  HENT: Negative.   Eyes: Negative.   Respiratory: Negative.   Cardiovascular: Negative.   Gastrointestinal: Negative for nausea and vomiting.  Genitourinary: Negative.   Musculoskeletal:       Pain in the gangrenous toes on both feet as well as some around his right groin area.  Skin:       Relates start discoloration with some drainage and toes on both feet. Also some drainage from the surgical site in his right groin.  Neurological: Negative.   Endo/Heme/Allergies: Negative.   Psychiatric/Behavioral: Negative.    There were no vitals taken for this visit. Physical Exam  Cardiovascular:  DP and PT pulses are not clearly palpated bilateral.  Musculoskeletal:  Guarded range of motion in the pedal joints. Muscle testing deferred.  Neurological:  Protective threshold monofilament wire and proprioception both. Be grossly intact.  Skin:  The skin is cool dry and atrophic with some cyanotic discoloration in the feet distally. Black gangrenous changes are noted to the distal aspect of the left great toe and right second toe with some mild drainage. No a sending cellulitis. The gangrenous areas distally are well demarcated.    Assessment/Plan: Assessment: 1. Gangrenous changes left great toe and right second toe. 2. Peripheral vascular disease, severe. 3. Diabetes  Plan: Discussed with the patient through an interpreter the need for amputation of the gangrenous toes. Discussed with the patient that the main potential complication  is inability to heal due to his vascular status and diabetes which may require a more proximal amputation. We will obtain a consent form for amputation of the left first and right second toes. Patient is currently nothing by mouth. Nurse related that the patient is in atrial fibrillation and patient will have to be cleared by internal medicine. If cleared we will go ahead and proceed with amputation of the above-mentioned toes as scheduled tonight. I did speak with Dr. Schnier  who agreed to go ahead and take the toes prior to any vascular reassessment.  Oleta Gunnoe W Martavis Gurney 07/20/2016, 1:10 PM     

## 2016-07-22 NOTE — Progress Notes (Addendum)
[facility] - [place] [facility]   PATIENT NAME: Eddie Myers    MR#:  [id number]  DATE OF BIRTH:  [birth year]  SUBJECTIVE:  Via Interpreter  Pt c/o Right sided vision loss. Otherwise currently not deny any complaints REVIEW OF SYSTEMS:   Review of Systems  Constitutional: Negative for chills, fever and weight loss.  HENT: Negative for ear discharge, ear pain and nosebleeds.   Eyes: Negative for blurred vision, pain and discharge.  Respiratory: Negative for sputum production, shortness of breath, wheezing and stridor.   Cardiovascular: Negative for chest pain, palpitations, orthopnea and PND.  Gastrointestinal: Negative for abdominal pain, diarrhea, nausea and vomiting.  Genitourinary: Negative for frequency and urgency.  Musculoskeletal: Negative for back pain and joint pain.  Neurological: Negative for sensory change, speech change, focal weakness and weakness.  Psychiatric/Behavioral: Negative for depression and hallucinations. The patient is not nervous/anxious.    Tolerating Diet:yes Tolerating PT: pending  DRUG ALLERGIES:  No Known Allergies  VITALS:  Blood pressure 135/70, pulse 100, temperature 98.2 F (36.8 C), temperature source Oral, resp. rate 18, height 5\' 8"  (1.727 m), weight 167 lb 8.8 oz (76 kg), SpO2 96 %.  PHYSICAL EXAMINATION:   Physical Exam  GENERAL:  61 y.o.-year-old patient lying in the bed with no acute distress. Chronically ill EYES: Pupils equal, round, reactive to light and accommodation. No scleral icterus. Extraocular muscles intact.  HEENT: Head atraumatic, normocephalic. Oropharynx and nasopharynx clear.  NECK:  Supple, no jugular venous distention. No thyroid enlargement, no tenderness.  LUNGS: Normal breath sounds bilaterally, no wheezing, rales, rhonchi. No use of accessory muscles of respiration.  CARDIOVASCULAR: S1, S2 normal. No murmurs, rubs, or gallops.  ABDOMEN: Soft, nontender, nondistended. Bowel sounds  present. No organomegaly or mass.  EXTREMITIES:bilateral severe gangrene Of toes, right groin stitches in place service some drainage but there is no erythema or swelling NEUROLOGIC: Cranial nerves II through XII are intact. No focal Motor or sensory deficits b/l.   PSYCHIATRIC:  patient is alert and oriented x 3.  SKIN: No obvious rash, lesion, or ulcer.   LABORATORY PANEL:  CBC  Recent Labs Lab 07/21/16 0335  WBC 7.3  HGB 9.6*  HCT 28.9*  PLT 304    Chemistries   Recent Labs Lab 07/21/16 0335  NA 134*  K 5.0  CL 99*  CO2 27  GLUCOSE 101*  BUN 33*  CREATININE 6.61*  CALCIUM 8.3*  AST 20  ALT 15*  ALKPHOS 115  BILITOT 0.6   Cardiac Enzymes No results for input(s): TROPONINI in the last 168 hours. RADIOLOGY:  No results found. ASSESSMENT AND PLAN:  Eddie Myers  is a 61 y.o. male with a known history of End-stage renal disease on hemodialysis Tuesday, Thursday and Saturday, diabetes, hypertension, hyperlipidemia, history of previous CVA, history of coronary artery disease status post bypass who presented to the hospital as a direct admit from podiatry office after his follow-up visit today as his dry gangrene of the left foot is becoming wet and podiatry is planning to do surgery today.   * Bilateral lower extremity cyanosis with necrotic toes: Plan for or today with podiatry -Coumadin on hold --Cardiology has cleared the patient for surgery during previous admission  06/24/2016 .patient's recent nuclear stress test was normal -CT scan of the pelvis and lower extremity with postoperative changes but no acute bleed -resume Coumadin and aspirin 81 mg enteric-coated per vascular recomendations AFTER surgery  *right groin incision with possible infection  Continue  IV Zosyn  Pain control with IV and oral pain medications Status post left lower extremity angiogram with angioplasty of left superficial femoral, popliteal and peroneal arteries.s/p Right lower extremity  angiogram - rt common femoral endarterectomy 07/04/16 with distal angioplasty .07/03/16   * Subacute right sided vision loss-patient had symptoms about 6 weeks ago.  -CT head shows a age-indeterminate right sided thalamic CV MRI shows old CVA  Patient will need outpatient ophthalmology evaluation  * Paroxysmal atrial fibrillation  Will hold Coumadin and check PT/INR   * End-stage renal disease on hemodialysis-patient gets dialysis on Tuesday Thursday Saturday.  -Dialysis tomorrow  * h/o hypertension-continue lisinopril, carvedilol, Norvasc.  *h/o Diabetes type 2 without complication-continue sliding scale insulin  * Hyperlipidemia-continue atorvastatin   Case discussed with Care Management/Social Worker. Management plans discussed with the patient, family and they are in agreement.  CODE STATUS: FULL  DVT Prophylaxis: Supratherapeutic INR  TOTAL TIME TAKING CARE OF THIS PATIENT: *30* minutes.  >50% time spent on counselling and coordination of care  POSSIBLE D/C IN 1-2 DAYS, DEPENDING ON CLINICAL CONDITION.  Note: This dictation was prepared with Dragon dictation along with smaller phrase technology. Any transcriptional errors that result from this process are unintentional.  Auburn BilberryPATEL, Shaylie Eklund M.D on 07/22/2016 at 2:43 PM  Between 7am to 6pm - Pager - [phone]  After 6pm go to [email or web address] - password [email or web address]  [facility]  Office  [phone]  CC: Primary care physician; System, Pcp Not In

## 2016-07-22 NOTE — Op Note (Signed)
Date of operation: 07/22/2016.  Surgeon: Ricci Barkerodd W Daundre Biel DPM.  Preoperative diagnosis: 1. Gangrene left great toe. 2. Gangrene right second toe.  Postoperative diagnosis: Same.  Procedures: 1. Amputation left great toe metatarsophalangeal joint. 2. Amputation right second toe metatarsophalangeal joint.  Anesthesia: LMA with local.  Hemostasis: None.  Estimated blood loss: 10-15 cc.  Pathology: Left great toe and right second toe.  Implants: None.  Complications: None apparent.  Operative indications: This is a 61 year old male with a history of significant peripheral vascular disease with recent vascular interventions in both lower extremities. Gangrenous changes noted to the toes at his previous admission. Started to have some drainage from the toes and the gangrene was well demarcated and was readmitted for amputation of the great toe on the left and right second toe.  Operative procedure: Patient was taken the operating room and placed on the table in the supine position. Following satisfactory LMA anesthesia the left first metatarsal and right second metatarsal area were injected with a total of 15 cc of 0.5% bupivacaine plain. Both feet were then prepped and draped in the usual sterile fashion. Attention was then directed to the distal aspect of the left foot where a racquet-type incision was made from medial to lateral encompassing the base of the left great toe. Incision was carried sharply down to the level of the bone and periosteal dissection carried back to the level of the metatarsophalangeal joint where the toe was disarticulated and removed in toto. It was noted to be good healthy bleeding tissues within the wound. The wound was flushed with copious amounts of sterile saline and closed using 3-0 nylon vertical mattress and simple interrupted sutures.   Attention was then directed to the distal aspect of the right foot where an elliptical incision was made coursing dorsal to  plantar around the medial and lateral base of the right second toe. Incision was carried down to the level of bone where periosteal dissection was taken back to the level of the metatarsophalangeal joint and the toe was disarticulated and removed in toto. Good healthy bleeding tissues were noted and the wound was flushed with copious amounts of sterile saline and closed using 3-0 nylon vertical mattress and simple interrupted sutures. Xeroform 4 x 4's and Kling were then applied to both of the feet to cover the incisions followed by Kerlix. Patient was awakened and transported to the PACU with vital signs stable and in good condition.

## 2016-07-22 NOTE — Progress Notes (Signed)
Pharmacy Antibiotic Note  Eddie Myers is a 61 y.o. male admitted on 07/20/2016 with Gangrenous changes on toes.  Pharmacy has been consulted for Zosyn dosing. Patient with ESRD on 3 times a week HD.  Plan: Continue Zosyn EI 3.375g IV Q12hr.   Height: 5\' 8"  (172.7 cm) Weight: 167 lb 8.8 oz (76 kg) IBW/kg (Calculated) : 68.4  Temp (24hrs), Avg:98.3 F (36.8 C), Min:98 F (36.7 C), Max:98.8 F (37.1 C)   Recent Labs Lab 07/20/16 1006 07/21/16 0335  WBC 5.9 7.3  CREATININE 5.65* 6.61*  LATICACIDVEN 0.9  --     Estimated Creatinine Clearance: 11.2 mL/min (A) (by C-G formula based on SCr of 6.61 mg/dL (H)).    No Known Allergies  Antimicrobials this admission: Zosyn 5/21 >>   Thank you for allowing pharmacy to be a part of this patient's care.  MLS 07/22/2016 12:26 PM

## 2016-07-22 NOTE — Progress Notes (Signed)
[facility]  ROUNDING NOTE   Subjective:   History taken with assistance of Spanish Interpreter.  Hemodialysis yesterday. Tolerated treatment well. UF 0.635mL.   Surgery for today.   Objective:  Vital signs in last 24 hours:  Temp:  [98 F (36.7 C)-98.8 F (37.1 C)] 98.2 F (36.8 C) (05/23 0800) Pulse Rate:  [72-115] 100 (05/23 0800) Resp:  [14-18] 18 (05/23 0800) BP: (119-143)/(67-82) 135/70 (05/23 0800) SpO2:  [96 %-98 %] 96 % (05/23 0800)  Weight change: -6 kg (-13 lb 3.7 oz) Filed Weights   07/20/16 1000 07/21/16 0951  Weight: 82 kg (180 lb 12.4 oz) 76 kg (167 lb 8.8 oz)    Intake/Output: I/O last 3 completed shifts: In: 200 [IV Piggyback:200] Out: 700 [Urine:200; Other:500]   Intake/Output this shift:  Total I/O In: 240 [P.O.:240] Out: -   Physical Exam: General: No acute distress  Head: Normocephalic, atraumatic. Moist oral mucosal membranes  Eyes: Anicteric  Neck: Supple, trachea midline  Lungs:  Clear to auscultation, normal effort  Heart: irregular  Abdomen:  Soft, nontender,    Extremities: No peripheral edema. Gangrene left 1st, 2nd and 3rd toe, right 2nd toe  Neurologic: Nonfocal, moving all four extremities  Skin: No lesions  Access: LUE AVF    Basic Metabolic Panel:  Recent Labs Lab 07/20/16 1006 07/21/16 0335  NA 133* 134*  K 3.3* 5.0  CL 95* 99*  CO2 28 27  GLUCOSE 131* 101*  BUN 27* 33*  CREATININE 5.65* 6.61*  CALCIUM 8.4* 8.3*    Liver Function Tests:  Recent Labs Lab 07/20/16 1006 07/21/16 0335  AST 23 20  ALT 17 15*  ALKPHOS 109 115  BILITOT 0.6 0.6  PROT 7.4 7.1  ALBUMIN 2.8* 2.6*   No results for input(s): LIPASE, AMYLASE in the last 168 hours. No results for input(s): AMMONIA in the last 168 hours.  CBC:  Recent Labs Lab 07/20/16 1006 07/21/16 0335  WBC 5.9 7.3  NEUTROABS 4.3  --   HGB 9.9* 9.6*  HCT 29.4* 28.9*  MCV 87.5 87.1  PLT 287 304    Cardiac Enzymes: No results for input(s):  CKTOTAL, CKMB, CKMBINDEX, TROPONINI in the last 168 hours.  BNP: Invalid input(s): POCBNP  CBG:  Recent Labs Lab 07/21/16 0730 07/21/16 1351 07/21/16 1608 07/21/16 2119 07/22/16 0724  GLUCAP 114* 103* 236* 90 108*    Microbiology: Results for orders placed or performed during the hospital encounter of 06/24/16  MRSA PCR Screening     Status: None   Collection Time: 06/24/16  5:05 AM  Result Value Ref Range Status   MRSA by PCR NEGATIVE NEGATIVE Final    Comment:        The GeneXpert MRSA Assay (FDA approved for NASAL specimens only), is one component of a comprehensive MRSA colonization surveillance program. It is not intended to diagnose MRSA infection nor to guide or monitor treatment for MRSA infections.   CULTURE, BLOOD (ROUTINE X 2) w Reflex to ID Panel     Status: None   Collection Time: 06/27/16  7:22 AM  Result Value Ref Range Status   Specimen Description BLOOD RIGHT HAND  Final   Special Requests   Final    BOTTLES DRAWN AEROBIC AND ANAEROBIC Blood Culture adequate volume   Culture NO GROWTH 5 DAYS  Final   Report Status 07/02/2016 FINAL  Final  CULTURE, BLOOD (ROUTINE X 2) w Reflex to ID Panel     Status: None   Collection  Time: 06/27/16  7:29 AM  Result Value Ref Range Status   Specimen Description BLOOD RIGHT ASSIST CONTROL  Final   Special Requests   Final    BOTTLES DRAWN AEROBIC AND ANAEROBIC Blood Culture adequate volume   Culture NO GROWTH 5 DAYS  Final   Report Status 07/02/2016 FINAL  Final    Coagulation Studies:  Recent Labs  07/20/16 1115 07/21/16 0335 07/22/16 0323  LABPROT 36.3* 41.6* 32.9*  INR 3.54 4.20* 3.13    Urinalysis: No results for input(s): COLORURINE, LABSPEC, PHURINE, GLUCOSEU, HGBUR, BILIRUBINUR, KETONESUR, PROTEINUR, UROBILINOGEN, NITRITE, LEUKOCYTESUR in the last 72 hours.  Invalid input(s): APPERANCEUR    Imaging: No results found.   Medications:   . piperacillin-tazobactam (ZOSYN)  IV 3.375 g  (07/22/16 0843)   . atorvastatin  80 mg Oral QPM  . ferric citrate  420 mg Oral TID WC  . insulin aspart  0-5 Units Subcutaneous QHS  . insulin aspart  0-9 Units Subcutaneous TID WC  . metoprolol tartrate  12.5 mg Oral BID  . tamsulosin  0.4 mg Oral QHS   acetaminophen **OR** acetaminophen, ipratropium-albuterol, metoprolol tartrate, morphine injection, nitroGLYCERIN, ondansetron **OR** ondansetron (ZOFRAN) IV, oxyCODONE, polyethylene glycol, senna-docusate  Assessment/ Plan:  61 y.o. Hispanic male ESRD on HD TTS, coronary artery disease, diabetes mellitus type 2, hyperlipidemia, hypertension, history of CVA, anemia chronic kidney disease, secondary hyperparathyroidism who presents with gangrenous changes on toes of both feet.  [facility] TTS [address] Left AVF   1.  ESRD on HD TTS:  Treatment for tomorrow.     2.  Anemia chronic kidney disease:  Hemoglobin 9.6 - Epogen 4000 with dialysis.    3.  Secondary hyperparathyroidism.   - Auryxia with meals.   4. Peripheral arterial disease: scheduled for amputation now that INR at goal.   5. Hypertension: blood pressure at goal.      LOS: 2 Pluma Diniz 5/23/201811:52 AM

## 2016-07-22 NOTE — Anesthesia Procedure Notes (Signed)
Procedure Name: LMA Insertion Date/Time: 07/22/2016 5:24 PM Performed by: Irving BurtonBACHICH, Imanni Burdine Pre-anesthesia Checklist: Patient identified, Emergency Drugs available, Suction available and Patient being monitored Patient Re-evaluated:Patient Re-evaluated prior to inductionOxygen Delivery Method: Circle system utilized Preoxygenation: Pre-oxygenation with 100% oxygen Intubation Type: IV induction Ventilation: Mask ventilation without difficulty LMA: LMA inserted LMA Size: 4.5 Number of attempts: 1 Placement Confirmation: positive ETCO2 and breath sounds checked- equal and bilateral Tube secured with: Tape Dental Injury: Teeth and Oropharynx as per pre-operative assessment

## 2016-07-22 NOTE — Progress Notes (Signed)
Notified Dr. Allena KatzPatel of INR 3.13 this am and surgery of this pm. No new orders at this time.

## 2016-07-22 NOTE — Anesthesia Postprocedure Evaluation (Signed)
Anesthesia Post Note  Patient: Vonna DraftsMauro Zetina  Procedure(s) Performed: Procedure(s) (LRB): AMPUTATION TOE LEFT GREAT TOE AND RIGHT 2ND TOE (Bilateral)  Patient location during evaluation: PACU Anesthesia Type: General Level of consciousness: awake and alert Pain management: pain level controlled Vital Signs Assessment: post-procedure vital signs reviewed and stable Respiratory status: spontaneous breathing, nonlabored ventilation, respiratory function stable and patient connected to nasal cannula oxygen Cardiovascular status: blood pressure returned to baseline and stable Postop Assessment: no signs of nausea or vomiting Anesthetic complications: no     Last Vitals:  Vitals:   07/22/16 2003 07/22/16 2105  BP: 137/70 122/69  Pulse: 89 (!) 101  Resp: 19 18  Temp: 36.6 C 36.6 C    Last Pain:  Vitals:   07/22/16 2105  TempSrc: Oral  PainSc:                  Yevette EdwardsJames G Adams

## 2016-07-23 ENCOUNTER — Encounter: Payer: Self-pay | Admitting: Podiatry

## 2016-07-23 LAB — CBC
HCT: 27.1 % — ABNORMAL LOW (ref 40.0–52.0)
HEMOGLOBIN: 9.1 g/dL — AB (ref 13.0–18.0)
MCH: 29.9 pg (ref 26.0–34.0)
MCHC: 33.5 g/dL (ref 32.0–36.0)
MCV: 89.1 fL (ref 80.0–100.0)
PLATELETS: 236 10*3/uL (ref 150–440)
RBC: 3.04 MIL/uL — ABNORMAL LOW (ref 4.40–5.90)
RDW: 17.4 % — ABNORMAL HIGH (ref 11.5–14.5)
WBC: 4.8 10*3/uL (ref 3.8–10.6)

## 2016-07-23 LAB — BASIC METABOLIC PANEL
Anion gap: 9 (ref 5–15)
BUN: 25 mg/dL — ABNORMAL HIGH (ref 6–20)
CHLORIDE: 97 mmol/L — AB (ref 101–111)
CO2: 30 mmol/L (ref 22–32)
Calcium: 8 mg/dL — ABNORMAL LOW (ref 8.9–10.3)
Creatinine, Ser: 6.43 mg/dL — ABNORMAL HIGH (ref 0.61–1.24)
GFR, EST AFRICAN AMERICAN: 10 mL/min — AB (ref >60)
GFR, EST NON AFRICAN AMERICAN: 8 mL/min — AB (ref >60)
Glucose, Bld: 125 mg/dL — ABNORMAL HIGH (ref 65–99)
POTASSIUM: 4.3 mmol/L (ref 3.5–5.1)
SODIUM: 136 mmol/L (ref 135–145)

## 2016-07-23 LAB — GLUCOSE, CAPILLARY
GLUCOSE-CAPILLARY: 127 mg/dL — AB (ref 65–99)
Glucose-Capillary: 117 mg/dL — ABNORMAL HIGH (ref 65–99)
Glucose-Capillary: 156 mg/dL — ABNORMAL HIGH (ref 65–99)
Glucose-Capillary: 167 mg/dL — ABNORMAL HIGH (ref 65–99)

## 2016-07-23 LAB — PROTIME-INR
INR: 3.06
PROTHROMBIN TIME: 32.3 s — AB (ref 11.4–15.2)

## 2016-07-23 MED ORDER — HYDROMORPHONE HCL 1 MG/ML IJ SOLN
0.5000 mg | Freq: Once | INTRAMUSCULAR | Status: AC
  Administered 2016-07-23: 0.5 mg via INTRAVENOUS
  Filled 2016-07-23: qty 1

## 2016-07-23 MED ORDER — OXYCODONE HCL 5 MG PO TABS
10.0000 mg | ORAL_TABLET | ORAL | Status: DC | PRN
  Administered 2016-07-23 – 2016-07-24 (×3): 10 mg via ORAL
  Filled 2016-07-23 (×3): qty 2

## 2016-07-23 MED ORDER — OXYCODONE HCL ER 10 MG PO T12A
10.0000 mg | EXTENDED_RELEASE_TABLET | Freq: Two times a day (BID) | ORAL | Status: DC
  Administered 2016-07-23 – 2016-07-24 (×3): 10 mg via ORAL
  Filled 2016-07-23 (×3): qty 1

## 2016-07-23 MED ORDER — EPOETIN ALFA 10000 UNIT/ML IJ SOLN
10000.0000 [IU] | INTRAMUSCULAR | Status: DC
  Administered 2016-07-23: 10000 [IU] via INTRAVENOUS
  Filled 2016-07-23: qty 1

## 2016-07-23 NOTE — Progress Notes (Signed)
Returned from dialysis. Took off 1500 cc, per report. BS 127, lunch ordered.

## 2016-07-23 NOTE — Progress Notes (Signed)
Patient is A&O x4, spanish speaking only. Oral pain meds with relief. Dressing to feet intact, Dr. Alberteen Spindleline to change tomorrow. Dressing to right groin area changed this shift.  Site looking better. Tolerated IV ABX. Tele in place. Bed alarm on for safety. Tolerating diet.

## 2016-07-23 NOTE — Progress Notes (Signed)
Patient went to dialysis. BS 117, Oxy and tylenol given for pain relief. VSS. NSL. Report given. Tele in place.

## 2016-07-23 NOTE — Progress Notes (Signed)
[facility]  ROUNDING NOTE   Subjective:   History taken with assistance of Spanish Interpreter.   Seen and examined on hemodialysis. Tolerating treatment well. UF goal of 1 litre  Surgery yesterday. Dr. Alberteen Spindleline: amputation of left first toe and right second toe. Reports no pain.   Objective:  Vital signs in last 24 hours:  Temp:  [97.1 F (36.2 C)-98.6 F (37 C)] 98 F (36.7 C) (05/24 0730) Pulse Rate:  [82-101] 90 (05/24 0730) Resp:  [7-23] 18 (05/24 0730) BP: (107-137)/(63-80) 125/65 (05/24 0730) SpO2:  [89 %-96 %] 94 % (05/24 0730) Weight:  [76.6 kg (168 lb 14.4 oz)] 76.6 kg (168 lb 14.4 oz) (05/24 0357)  Weight change: 0.613 kg (1 lb 5.6 oz) Filed Weights   07/20/16 1000 07/21/16 0951 07/23/16 0357  Weight: 82 kg (180 lb 12.4 oz) 76 kg (167 lb 8.8 oz) 76.6 kg (168 lb 14.4 oz)    Intake/Output: I/O last 3 completed shifts: In: 440 [P.O.:240; IV Piggyback:200] Out: 375 [Urine:375]   Intake/Output this shift:  No intake/output data recorded.  Physical Exam: General: No acute distress  Head: Normocephalic, atraumatic. Moist oral mucosal membranes  Eyes: Anicteric  Neck: Supple, trachea midline  Lungs:  Clear to auscultation, normal effort  Heart: regular  Abdomen:  Soft, nontender,    Extremities: No peripheral edema. Gangrene left 1st, 2nd and 3rd toe, right 2nd toe  Neurologic: Nonfocal, moving all four extremities  Skin: No lesions  Access: LUE AVF    Basic Metabolic Panel:  Recent Labs Lab 07/20/16 1006 07/21/16 0335 07/23/16 0347  NA 133* 134* 136  K 3.3* 5.0 4.3  CL 95* 99* 97*  CO2 28 27 30   GLUCOSE 131* 101* 125*  BUN 27* 33* 25*  CREATININE 5.65* 6.61* 6.43*  CALCIUM 8.4* 8.3* 8.0*    Liver Function Tests:  Recent Labs Lab 07/20/16 1006 07/21/16 0335  AST 23 20  ALT 17 15*  ALKPHOS 109 115  BILITOT 0.6 0.6  PROT 7.4 7.1  ALBUMIN 2.8* 2.6*   No results for input(s): LIPASE, AMYLASE in the last 168 hours. No results  for input(s): AMMONIA in the last 168 hours.  CBC:  Recent Labs Lab 07/20/16 1006 07/21/16 0335 07/23/16 0347  WBC 5.9 7.3 4.8  NEUTROABS 4.3  --   --   HGB 9.9* 9.6* 9.1*  HCT 29.4* 28.9* 27.1*  MCV 87.5 87.1 89.1  PLT 287 304 236    Cardiac Enzymes: No results for input(s): CKTOTAL, CKMB, CKMBINDEX, TROPONINI in the last 168 hours.  BNP: Invalid input(s): POCBNP  CBG:  Recent Labs Lab 07/22/16 1146 07/22/16 1616 07/22/16 1849 07/22/16 2106 07/23/16 0741  GLUCAP 141* 111* 104* 141* 117*    Microbiology: Results for orders placed or performed during the hospital encounter of 06/24/16  MRSA PCR Screening     Status: None   Collection Time: 06/24/16  5:05 AM  Result Value Ref Range Status   MRSA by PCR NEGATIVE NEGATIVE Final    Comment:        The GeneXpert MRSA Assay (FDA approved for NASAL specimens only), is one component of a comprehensive MRSA colonization surveillance program. It is not intended to diagnose MRSA infection nor to guide or monitor treatment for MRSA infections.   CULTURE, BLOOD (ROUTINE X 2) w Reflex to ID Panel     Status: None   Collection Time: 06/27/16  7:22 AM  Result Value Ref Range Status   Specimen Description BLOOD RIGHT  HAND  Final   Special Requests   Final    BOTTLES DRAWN AEROBIC AND ANAEROBIC Blood Culture adequate volume   Culture NO GROWTH 5 DAYS  Final   Report Status 07/02/2016 FINAL  Final  CULTURE, BLOOD (ROUTINE X 2) w Reflex to ID Panel     Status: None   Collection Time: 06/27/16  7:29 AM  Result Value Ref Range Status   Specimen Description BLOOD RIGHT ASSIST CONTROL  Final   Special Requests   Final    BOTTLES DRAWN AEROBIC AND ANAEROBIC Blood Culture adequate volume   Culture NO GROWTH 5 DAYS  Final   Report Status 07/02/2016 FINAL  Final    Coagulation Studies:  Recent Labs  07/20/16 1115 07/21/16 0335 07/22/16 0323 07/23/16 0347  LABPROT 36.3* 41.6* 32.9* 32.3*  INR 3.54 4.20* 3.13 3.06     Urinalysis: No results for input(s): COLORURINE, LABSPEC, PHURINE, GLUCOSEU, HGBUR, BILIRUBINUR, KETONESUR, PROTEINUR, UROBILINOGEN, NITRITE, LEUKOCYTESUR in the last 72 hours.  Invalid input(s): APPERANCEUR    Imaging: No results found.   Medications:   . piperacillin-tazobactam (ZOSYN)  IV Stopped (07/23/16 0044)   . atorvastatin  80 mg Oral QPM  . epoetin (EPOGEN/PROCRIT) injection  10,000 Units Intravenous Q T,Th,Sa-HD  . ferric citrate  420 mg Oral TID WC  . insulin aspart  0-5 Units Subcutaneous QHS  . insulin aspart  0-9 Units Subcutaneous TID WC  . metoprolol tartrate  12.5 mg Oral BID  . tamsulosin  0.4 mg Oral QHS   acetaminophen **OR** acetaminophen, fentaNYL (SUBLIMAZE) injection, ipratropium-albuterol, metoprolol tartrate, morphine injection, nitroGLYCERIN, ondansetron **OR** ondansetron (ZOFRAN) IV, ondansetron (ZOFRAN) IV, oxyCODONE, polyethylene glycol, senna-docusate, traZODone  Assessment/ Plan:  61 y.o. Hispanic male ESRD on HD TTS, coronary artery disease, diabetes mellitus type 2, hyperlipidemia, hypertension, history of CVA, anemia chronic kidney disease, secondary hyperparathyroidism who presents with gangrenous changes on toes of both feet.  [facility] TTS [address] Left AVF   1.  ESRD on HD TTS:  Seen and examined on hemodialysis. Tolerating treatment well. UF goal of 1 litre Continue TTS schedule   2.  Anemia chronic kidney disease:  Hemoglobin 9.1 - Epogen 10000 with dialysis.    3.  Secondary hyperparathyroidism.   - Auryxia with meals.   4. 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 Alberteen Spindle on 5/23.   5. Hypertension: blood pressure at goal.  - tamsulosin     LOS: 3 Eddie Myers 5/24/20189:26 AM

## 2016-07-23 NOTE — Progress Notes (Signed)
1 Day Post-Op   Subjective/Chief Complaint: Patient seen. Complains of significant pain with his feet. 10 or above.   Objective: Vital signs in last 24 hours: Temp:  [97.1 F (36.2 C)-98.6 F (37 C)] 97.5 F (36.4 C) (05/24 1440) Pulse Rate:  [82-111] 111 (05/24 1440) Resp:  [7-23] 18 (05/24 1440) BP: (107-140)/(62-85) 122/64 (05/24 1440) SpO2:  [89 %-100 %] 95 % (05/24 1440) Weight:  [75.2 kg (165 lb 12.6 oz)-76.6 kg (168 lb 14.4 oz)] 75.2 kg (165 lb 12.6 oz) (05/24 1425) Last BM Date: 07/21/16  Intake/Output from previous day: 05/23 0701 - 05/24 0700 In: 340 [P.O.:240; IV Piggyback:100] Out: 175 [Urine:175] Intake/Output this shift: Total I/O In: 0  Out: 1000 [Other:1000]  The dressings are dry and intact on both feet with no evidence of any strikethrough.  Lab Results:   Recent Labs  07/21/16 0335 07/23/16 0347  WBC 7.3 4.8  HGB 9.6* 9.1*  HCT 28.9* 27.1*  PLT 304 236   BMET  Recent Labs  07/21/16 0335 07/23/16 0347  NA 134* 136  K 5.0 4.3  CL 99* 97*  CO2 27 30  GLUCOSE 101* 125*  BUN 33* 25*  CREATININE 6.61* 6.43*  CALCIUM 8.3* 8.0*   PT/INR  Recent Labs  07/22/16 0323 07/23/16 0347  LABPROT 32.9* 32.3*  INR 3.13 3.06   ABG No results for input(s): PHART, HCO3 in the last 72 hours.  Invalid input(s): PCO2, PO2  Studies/Results: No results found.  Anti-infectives: Anti-infectives    Start     Dose/Rate Route Frequency Ordered Stop   07/20/16 1030  piperacillin-tazobactam (ZOSYN) IVPB 3.375 g     3.375 g 12.5 mL/hr over 240 Minutes Intravenous Every 12 hours 07/20/16 1016        Assessment/Plan: s/p Procedure(s): AMPUTATION TOE LEFT GREAT TOE AND RIGHT 2ND TOE (Bilateral) Gangrene status post amputation left first and right second toes.   Plan: Dressing was left intact. We will plan for a dressing change tomorrow morning and if stable he should be ready from a foot standpoint for transfer to skilled nursing. Patient states his  main concern is pain management. Reassured him that this can be managed at the nursing home. Hopefully we will be able to discharge tomorrow  LOS: 3 days    Eddie Myers 07/23/2016

## 2016-07-23 NOTE — Progress Notes (Signed)
[facility] [place] [facility]   PATIENT NAME: Eddie Myers    MR#:  [id number]  DATE OF BIRTH:  1955/08/25  SUBJECTIVE:   Patient complains of pain in both of the legs.  REVIEW OF SYSTEMS:   Review of Systems  Constitutional: Negative for chills, fever and weight loss.  HENT: Negative for ear discharge, ear pain and nosebleeds.   Eyes: Negative for blurred vision, pain and discharge.  Respiratory: Negative for sputum production, shortness of breath, wheezing and stridor.   Cardiovascular: Negative for chest pain, palpitations, orthopnea and PND.  Gastrointestinal: Negative for abdominal pain, diarrhea, nausea and vomiting.  Genitourinary: Negative for frequency and urgency.  Musculoskeletal: Negative for back pain and joint pain.  Neurological: Negative for sensory change, speech change, focal weakness and weakness.  Psychiatric/Behavioral: Negative for depression and hallucinations. The patient is not nervous/anxious.    Tolerating Diet:yes Tolerating PT: pending  DRUG ALLERGIES:  No Known Allergies  VITALS:  Blood pressure 122/85, pulse 92, temperature 98 F (36.7 C), temperature source Oral, resp. rate 19, height 5\' 8"  (1.727 m), weight 167 lb 15.9 oz (76.2 kg), SpO2 100 %.  PHYSICAL EXAMINATION:   Physical Exam  GENERAL:  61 y.o.-year-old patient lying in the bed with no acute distress. Chronically ill EYES: Pupils equal, round, reactive to light and accommodation. No scleral icterus. Extraocular muscles intact.  HEENT: Head atraumatic, normocephalic. Oropharynx and nasopharynx clear.  NECK:  Supple, no jugular venous distention. No thyroid enlargement, no tenderness.  LUNGS: Normal breath sounds bilaterally, no wheezing, rales, rhonchi. No use of accessory muscles of respiration.  CARDIOVASCULAR: S1, S2 normal. No murmurs, rubs, or gallops.  ABDOMEN: Soft, nontender, nondistended. Bowel sounds present. No organomegaly or mass.   EXTREMITIES:Dressing in place NEUROLOGIC: Cranial nerves II through XII are intact. No focal Motor or sensory deficits b/l.   PSYCHIATRIC:  patient is alert and oriented x 3.  SKIN: No obvious rash, lesion, or ulcer.   LABORATORY PANEL:  CBC  Recent Labs Lab 07/23/16 0347  WBC 4.8  HGB 9.1*  HCT 27.1*  PLT 236    Chemistries   Recent Labs Lab 07/21/16 0335 07/23/16 0347  NA 134* 136  K 5.0 4.3  CL 99* 97*  CO2 27 30  GLUCOSE 101* 125*  BUN 33* 25*  CREATININE 6.61* 6.43*  CALCIUM 8.3* 8.0*  AST 20  --   ALT 15*  --   ALKPHOS 115  --   BILITOT 0.6  --    Cardiac Enzymes No results for input(s): TROPONINI in the last 168 hours. RADIOLOGY:  No results found. ASSESSMENT AND PLAN:  Eddie DraftsMauro Myers  is a 61 y.o. male with a known history of End-stage renal disease on hemodialysis Tuesday, Thursday and Saturday, diabetes, hypertension, hyperlipidemia, history of previous CVA, history of coronary artery disease status post bypass who presented to the hospital as a direct admit from podiatry office after his follow-up visit today as his dry gangrene of the left foot is becoming wet and podiatry is planning to do surgery today.   * Bilateral lower extremity cyanosis with necrotic toes:Status post amputation of the left great toe metatarsophalangeal joint as well Amputation right second toe metatarsophalangeal  - Resume Coumadin --Cardiology has cleared the patient for surgery during previous admission  06/24/2016 .patient's recent nuclear stress test was normal -CT scan of the pelvis and lower extremity with postoperative changes but no acute bleed -resume Coumadin and aspirin 81 mg enteric-coated per vascular  recomendations AFTER surgery  *right groin incision with possible infection  Continue IV Zosyn changed to oral Augmentin soon Status post left lower extremity angiogram with angioplasty of left superficial femoral, popliteal and peroneal arteries.s/p Right lower  extremity angiogram - rt common femoral endarterectomy 07/04/16 with distal angioplasty .07/03/16   * Subacute right sided vision loss-patient had symptoms about 6 weeks ago.  -CT head shows a age-indeterminate right sided thalamic CV MRI shows old CVA  Patient will need outpatient ophthalmology evaluation  * Paroxysmal atrial fibrillation  Continue to hold Coumadin since INR is still 3.06  * End-stage renal disease on hemodialysis-patient gets dialysis on Tuesday Thursday Saturday.  -Dialysis tomorrow  * h/o hypertension-continue lisinopril, carvedilol, Norvasc.  *h/o Diabetes type 2 without complication-continue sliding scale insulin  * Hyperlipidemia-continue atorvastatin   Case discussed with Care Management/Social Worker. Management plans discussed with the patient, family and they are in agreement.  CODE STATUS: FULL   DVT Prophylaxis: Can restart Coumadin INR is still 3.06  TOTAL TIME TAKING CARE OF THIS PATIENT: *30* minutes.  >50% time spent on counselling and coordination of care  POSSIBLE D/C IN 1-2 DAYS, DEPENDING ON CLINICAL CONDITION.  Note: This dictation was prepared with Dragon dictation along with smaller phrase technology. Any transcriptional errors that result from this process are unintentional.  Auburn Bilberry M.D on 07/23/2016 at 12:05 PM  Between 7am to 6pm - Pager - [phone]  After 6pm go to [email or web address] - password [email or web address]  [facility]  Office  [phone]  CC: Primary care physician; System, Pcp Not In

## 2016-07-23 NOTE — Progress Notes (Signed)
Patient had surgery yesterday and is going to dialysis today per RN in progression rounds. Plan is for patient to D/C back to [facility] once medically stable. [facility] liaison is aware of above. Clinical Social Worker (CSW) will continue to follow and assist as needed.   Baker Hughes IncorporatedBailey Kamarri Lovvorn, LCSW [phone]

## 2016-07-24 LAB — CBC
HCT: 24.3 % — ABNORMAL LOW (ref 40.0–52.0)
Hemoglobin: 8.3 g/dL — ABNORMAL LOW (ref 13.0–18.0)
MCH: 30.3 pg (ref 26.0–34.0)
MCHC: 34.1 g/dL (ref 32.0–36.0)
MCV: 89 fL (ref 80.0–100.0)
PLATELETS: 208 10*3/uL (ref 150–440)
RBC: 2.73 MIL/uL — ABNORMAL LOW (ref 4.40–5.90)
RDW: 17.3 % — AB (ref 11.5–14.5)
WBC: 5.7 10*3/uL (ref 3.8–10.6)

## 2016-07-24 LAB — GLUCOSE, CAPILLARY
Glucose-Capillary: 128 mg/dL — ABNORMAL HIGH (ref 65–99)
Glucose-Capillary: 143 mg/dL — ABNORMAL HIGH (ref 65–99)
Glucose-Capillary: 134 mg/dL — ABNORMAL HIGH (ref 65–99)

## 2016-07-24 LAB — PROTIME-INR
INR: 1.9
PROTHROMBIN TIME: 22.1 s — AB (ref 11.4–15.2)

## 2016-07-24 LAB — SURGICAL PATHOLOGY

## 2016-07-24 MED ORDER — OXYCODONE HCL ER 15 MG PO T12A: 15.0000 mg | EXTENDED_RELEASE_TABLET | Freq: Two times a day (BID) | ORAL | 0 refills | Status: AC

## 2016-07-24 MED ORDER — BISACODYL 10 MG RE SUPP
10.0000 mg | Freq: Once | RECTAL | Status: AC
  Administered 2016-07-24: 10 mg via RECTAL
  Filled 2016-07-24: qty 1

## 2016-07-24 MED ORDER — AMOXICILLIN-POT CLAVULANATE 500-125 MG PO TABS: 1.0000 | ORAL_TABLET | Freq: Two times a day (BID) | ORAL | 0 refills | Status: AC

## 2016-07-24 MED ORDER — DOCUSATE SODIUM 100 MG PO CAPS: 200.0000 mg | ORAL_CAPSULE | Freq: Two times a day (BID) | ORAL | 0 refills | Status: AC

## 2016-07-24 MED ORDER — PREGABALIN 75 MG PO CAPS: 75.0000 mg | ORAL_CAPSULE | Freq: Two times a day (BID) | ORAL | 0 refills | Status: AC

## 2016-07-24 MED ORDER — TRAZODONE HCL 50 MG PO TABS: 25.0000 mg | ORAL_TABLET | Freq: Every evening | ORAL | 0 refills | Status: AC | PRN

## 2016-07-24 MED ORDER — DOCUSATE SODIUM 100 MG PO CAPS
200.0000 mg | ORAL_CAPSULE | Freq: Two times a day (BID) | ORAL | Status: DC
  Administered 2016-07-24: 200 mg via ORAL
  Filled 2016-07-24: qty 2

## 2016-07-24 MED ORDER — PREGABALIN 75 MG PO CAPS
75.0000 mg | ORAL_CAPSULE | Freq: Two times a day (BID) | ORAL | Status: DC
Start: 2016-07-24 — End: 2016-07-24
  Administered 2016-07-24: 75 mg via ORAL
  Filled 2016-07-24: qty 1

## 2016-07-24 MED ORDER — POLYETHYLENE GLYCOL 3350 17 G PO PACK: 17.0000 g | PACK | Freq: Every day | ORAL | Status: DC

## 2016-07-24 MED ORDER — OXYCODONE HCL 5 MG PO TABS: 20.0000 mg | ORAL_TABLET | ORAL | Status: DC | PRN

## 2016-07-24 MED ORDER — INSULIN ASPART 100 UNIT/ML ~~LOC~~ SOLN: 0.0000 [IU] | Freq: Every day | SUBCUTANEOUS | 11 refills | Status: AC

## 2016-07-24 MED ORDER — AMOXICILLIN-POT CLAVULANATE 500-125 MG PO TABS
1.0000 | ORAL_TABLET | Freq: Two times a day (BID) | ORAL | Status: DC
  Administered 2016-07-24: 500 mg via ORAL
  Filled 2016-07-24 (×2): qty 1

## 2016-07-24 MED ORDER — OXYCODONE HCL ER 15 MG PO T12A: 15.0000 mg | EXTENDED_RELEASE_TABLET | Freq: Two times a day (BID) | ORAL | Status: DC

## 2016-07-24 MED ORDER — OXYCODONE HCL 20 MG PO TABS: 20.0000 mg | ORAL_TABLET | ORAL | 0 refills | Status: AC | PRN

## 2016-07-24 NOTE — Progress Notes (Addendum)
EMS here to take patient to [facility]. VSS at time of discharge.

## 2016-07-24 NOTE — Progress Notes (Addendum)
Patient is medically stable for D/C back to [facility] today. Clinical Child psychotherapistocial Worker (CSW) utilized a Research officer, trade unionspanish interpreter to meet with patient and his daughter Skeet SimmerFrancisca. Per [facility] liaison patient can return today to room 611. RN will call report to RN Konrad DoloresKim Hicks at [phone] and arrange EMS for transport. CSW sent D/C orders to [facility] via [facility]. [facility] is aware of above. Patient is aware of above. Patient's daughter Skeet SimmerFrancisca is at bedside and aware of above. Please reconsult if future social work needs arise. CSW signing off.   B[employer] Hughes IncorporatedBailey Arush Gatliff, LCSW [phone]

## 2016-07-24 NOTE — Progress Notes (Signed)
Pharmacy Antibiotic Note  Eddie Myers is a 61 y.o. male admitted on 07/20/2016 with Gangrenous changes on toes.  Pharmacy has been consulted for Zosyn dosing. Patient with ESRD on 3 times a week HD.  Plan: Continue Zosyn EI 3.375g IV Q12hr.   Height: 5\' 8"  (172.7 cm) Weight: 166 lb 3.6 oz (75.4 kg) IBW/kg (Calculated) : 68.4  Temp (24hrs), Avg:98 F (36.7 C), Min:97.5 F (36.4 C), Max:98.4 F (36.9 C)   Recent Labs Lab 07/20/16 1006 07/21/16 0335 07/23/16 0347 07/24/16 0334  WBC 5.9 7.3 4.8 5.7  CREATININE 5.65* 6.61* 6.43*  --   LATICACIDVEN 0.9  --   --   --     Estimated Creatinine Clearance: 11.5 mL/min (A) (by C-G formula based on SCr of 6.43 mg/dL (H)).    No Known Allergies  Antimicrobials this admission: Zosyn 5/21 >>   Thank you for allowing pharmacy to be a part of this patient's care.  Crist FatHannah Myan Locatelli, PharmD, BCPS Clinical Pharmacist 07/24/2016 10:03 AM

## 2016-07-24 NOTE — Progress Notes (Signed)
2 Days Post-Op   Subjective/Chief Complaint: Patient seen. Still having significant pain in his feet. This is his main concern about leaving the hospital.   Objective: Vital signs in last 24 hours: Temp:  [97.5 F (36.4 C)-98.4 F (36.9 C)] 98.2 F (36.8 C) (05/25 0734) Pulse Rate:  [84-111] 84 (05/25 0734) Resp:  [9-22] 19 (05/25 0734) BP: (105-140)/(64-85) 115/64 (05/25 0734) SpO2:  [89 %-100 %] 89 % (05/25 0734) Weight:  [75.2 kg (165 lb 12.6 oz)-75.4 kg (166 lb 3.6 oz)] 75.4 kg (166 lb 3.6 oz) (05/25 0500) Last BM Date: 07/21/16  Intake/Output from previous day: 05/24 0701 - 05/25 0700 In: 360 [P.O.:360] Out: 1050 [Urine:50] Intake/Output this shift: Total I/O In: 360 [P.O.:360] Out: -   The bandages are dry and intact. Upon removal there is minimal bleeding on the bandaging. The incisions are well coapted with some mild signs of some early incisional necrosis but overall they appear stable. No signs of purulence or infection at this point.  Lab Results:   Recent Labs  07/23/16 0347 07/24/16 0334  WBC 4.8 5.7  HGB 9.1* 8.3*  HCT 27.1* 24.3*  PLT 236 208   BMET  Recent Labs  07/23/16 0347  NA 136  K 4.3  CL 97*  CO2 30  GLUCOSE 125*  BUN 25*  CREATININE 6.43*  CALCIUM 8.0*   PT/INR  Recent Labs  07/23/16 0347 07/24/16 0334  LABPROT 32.3* 22.1*  INR 3.06 1.90   ABG No results for input(s): PHART, HCO3 in the last 72 hours.  Invalid input(s): PCO2, PO2  Studies/Results: No results found.  Anti-infectives: Anti-infectives    Start     Dose/Rate Route Frequency Ordered Stop   07/20/16 1030  piperacillin-tazobactam (ZOSYN) IVPB 3.375 g     3.375 g 12.5 mL/hr over 240 Minutes Intravenous Every 12 hours 07/20/16 1016        Assessment/Plan: s/p Procedure(s): AMPUTATION TOE LEFT GREAT TOE AND RIGHT 2ND TOE (Bilateral) Assessment: Stable status post amputation left great toe and right second toe   Plan: Betadine and sterile bandages  reapplied to both feet. Discussed with the patient through an interpreter that these bandages will stay in place and we will follow him up next week outpatient for reevaluation. No guarantees as to healing could be given. From a foot standpoint the patient should be stable for transfer today. Again his main concern is pain management as he is still relating significant pain with the feet and legs  LOS: 4 days    Ricci Barkerodd W Luba Matzen 07/24/2016

## 2016-07-24 NOTE — Care Management Important Message (Signed)
Important Message  Patient Details  Name: Eddie Myers MRN: [id number] Date of Birth: 1955/06/21   Medicare Important Message Given:  Yes    Marily MemosLisa M Abbygail [place], RN 07/24/2016, 12:01 PM

## 2016-07-24 NOTE — Discharge Summary (Addendum)
[facility] - [place] at [facility], 61 y.o., DOB 08-Oct-1955, MRN [id number]. Admission date: 07/20/2016 Discharge Date 07/24/2016 Primary MD System, Pcp Not In Admitting Physician Ramonita Lab, MD  Admission Diagnosis  Gangrene n/a n/a  Discharge Diagnosis   Active Problems:   Gangrene of foot (HCC)   right groin incision with possible infection    Right sided vision loss   Old cva   Paroxysmal atrial fibrillation   End-stage renal disease   Hypertension   Diabetes 2   Hyperlipidemia  PVD      Hospital Course Eddie Myers  is a 61 y.o. male with a known history of End-stage renal disease on hemodialysis Tuesday, Thursday and Saturday, diabetes, hypertension, hyperlipidemia, history of previous CVA, history of coronary artery disease status post bypass who presented to the hospital as a direct admit from podiatry office after his follow-up visit today as his dry gangrene of the left foot. Patient was admitted for further evaluation and therapy. Patient also had a recent vascular intervention and had some drainage through his right groin. Patient was seen by podiatry and underwent following procedure.  1. Amputation left great toe metatarsophalangeal joint. 2. Amputation right second toe metatarsophalangeal joint.  He tolerated the procedure he is having significant amount of pain so his pain medications have been adjusted. Podiatry recommends keeping the dressing in place until seen in the office next week.  Patient also noted to have right sided groin drainage where he had previous vascular access. He is kept on antibiotics. He'll need to follow-up with vascular surgery.   He'll be continued on oral Augmentin for 7 more days  Also the right groin dressing needs to be changed twice daily as instructed with Betadine          Consults   Podiatry  Significant Tests:  See full reports for all details     Ct Pelvis W Contrast  Result Date:  07/07/2016 CLINICAL DATA:  Severe right groin pain. Drop in hematocrit and hemoglobin. Status post right common femoral, profunda femoris and superficial femoral artery endarterectomies with separate core matrix patch angioplasty of the common and superficial femoral arteries and second patchy angioplasty of the profunda femoris artery on 07/03/2016. Additional angioplasties of the right popliteal and right posterior tibial arteries. EXAM: CT PELVIS AND BILATERAL LOWER EXTREMITIES WITH CONTRAST TECHNIQUE: Multidetector CT imaging of the pelvis and bilateral lower extremities was performed according to the standard protocol following bolus administration of intravenous contrast. Multiplanar CT image reconstructions were also generated. CONTRAST:  ISOVUE-300 IOPAMIDOL (ISOVUE-300) INJECTION 61% COMPARISON:  None. FINDINGS: CT PELVIS FINDINGS Urinary Tract:  Unremarkable urinary bladder and distal ureters. Bowel:  Unremarkable. Vascular/Lymphatic: Atheromatous arterial calcifications and plaques. Reproductive:  Normal sized prostate gland. Other: Small bilateral inguinal hernias containing fat. Mild right groin. Vascular and subcutaneous edema and postoperative air, extending superiorly and inferiorly. No discrete abscess or hematoma seen. Musculoskeletal: Mild lower lumbar spine degenerative changes. Left iliac bone cyst. CT LEFT/RIGHT LOWER EXTREMITY FINDINGS Bones/Joint/Cartilage Unremarkable. Ligaments Suboptimally assessed by CT. Muscles and Tendons Mild low density in the lateral aspect of the proximal adductor longus muscle on the right. No significant enlargement of the masses Soft tissues Mild diffuse subcutaneous edema anteriorly, laterally and medially in the proximal right thigh. Vascular: Multifocal arterial atheromatous calcifications and plaques. IMPRESSION: 1. Right groin postoperative changes with mild associated soft tissue edema and air, including mild edema in the lateral aspect of the proximal  adductor longus muscle on  the right. No visible hematoma. 2. Extensive bilateral arterial atheromatous changes. 3. Small bilateral inguinal hernias containing fat. Electronically Signed   By: Beckie SaltsSteven  Reid M.D.   On: 07/07/2016 13:28   Mr Brain Wo Contrast  Result Date: 06/26/2016 CLINICAL DATA:  61 y/o  M; cerebral infarction. EXAM: MRI HEAD WITHOUT CONTRAST TECHNIQUE: Multiplanar, multiecho pulse sequences of the brain and surrounding structures were obtained without intravenous contrast. COMPARISON:  06/24/2016 CT of the head. FINDINGS: Brain: No acute infarction, hemorrhage, hydrocephalus, extra-axial collection or mass lesion. Foci of T2 FLAIR hyperintense signal abnormality in periventricular white matter are nonspecific but compatible with mild chronic microvascular ischemic changes. There is mild brain parenchymal volume loss. Small chronic lacunar infarcts are present within the left inferior cerebellar hemisphere, bilateral thalamus, and right lentiform nucleus. Vascular: Normal flow voids. Skull and upper cervical spine: Normal marrow signal. Sinuses/Orbits: Negative. Other: None. IMPRESSION: 1. No acute intracranial abnormality identified. 2. Mild chronic microvascular ischemic changes and mild parenchymal volume loss of the brain. 3. Small chronic lacunar infarcts are present within the left inferior cerebellar hemisphere, bilateral thalamus, and right lentiform nucleus. Electronically Signed   By: Mitzi HansenLance  Furusawa-Stratton M.D.   On: 06/26/2016 13:58   Dg Foot Complete Left  Result Date: 06/28/2016 CLINICAL DATA:  Gangrene EXAM: LEFT FOOT - COMPLETE 3+ VIEW COMPARISON:  06/20/2016 FINDINGS: No acute fracture or dislocation. Generalized osteopenia. No periosteal reaction or bone destruction. No soft tissue emphysema. Extensive peripheral vascular atherosclerotic disease. IMPRESSION: 1.  No acute abnormality of the left foot. Electronically Signed   By: Elige KoHetal  Nam Vossler   On: 06/28/2016 11:33   Dg  Foot Complete Right  Result Date: 06/28/2016 CLINICAL DATA:  Gangrene EXAM: RIGHT FOOT COMPLETE - 3+ VIEW COMPARISON:  06/20/2016 FINDINGS: No acute fracture or dislocation. Generalized osteopenia. No periosteal reaction or bone destruction. No soft tissue emphysema. Extensive peripheral vascular atherosclerotic disease. IMPRESSION: 1. No acute abnormality of the right foot. Electronically Signed   By: Elige KoHetal  Aamirah Salmi   On: 06/28/2016 11:32   Ct Extrem Lower W Cm Bil  Result Date: 07/07/2016 CLINICAL DATA:  Severe right groin pain. Drop in hematocrit and hemoglobin. Status post right common femoral, profunda femoris and superficial femoral artery endarterectomies with separate core matrix patch angioplasty of the common and superficial femoral arteries and second patchy angioplasty of the profunda femoris artery on 07/03/2016. Additional angioplasties of the right popliteal and right posterior tibial arteries. EXAM: CT PELVIS AND BILATERAL LOWER EXTREMITIES WITH CONTRAST TECHNIQUE: Multidetector CT imaging of the pelvis and bilateral lower extremities was performed according to the standard protocol following bolus administration of intravenous contrast. Multiplanar CT image reconstructions were also generated. CONTRAST:  100mL ISOVUE-300 IOPAMIDOL (ISOVUE-300) INJECTION 61% COMPARISON:  None. FINDINGS: CT PELVIS FINDINGS Urinary Tract:  Unremarkable urinary bladder and distal ureters. Bowel:  Unremarkable. Vascular/Lymphatic: Atheromatous arterial calcifications and plaques. Reproductive:  Normal sized prostate gland. Other: Small bilateral inguinal hernias containing fat. Mild right groin. Vascular and subcutaneous edema and postoperative air, extending superiorly and inferiorly. No discrete abscess or hematoma seen. Musculoskeletal: Mild lower lumbar spine degenerative changes. Left iliac bone cyst. CT LEFT/RIGHT LOWER EXTREMITY FINDINGS Bones/Joint/Cartilage Unremarkable. Ligaments Suboptimally assessed by CT.  Muscles and Tendons Mild low density in the lateral aspect of the proximal adductor longus muscle on the right. No significant enlargement of the masses Soft tissues Mild diffuse subcutaneous edema anteriorly, laterally and medially in the proximal right thigh. Vascular: Multifocal arterial atheromatous calcifications and plaques. IMPRESSION: 1. Right groin postoperative  changes with mild associated soft tissue edema and air, including mild edema in the lateral aspect of the proximal adductor longus muscle on the right. No visible hematoma. 2. Extensive bilateral arterial atheromatous changes. 3. Small bilateral inguinal hernias containing fat. Electronically Signed   By: Beckie Salts M.D.   On: 07/07/2016 13:28       Today   Subjective:   Eddie Myers complains of neuropathic pain in both lower extremities  Objective:   Blood pressure 115/71, pulse 91, temperature 98.2 F (36.8 C), temperature source Oral, resp. rate 16, height 5\' 8"  (1.727 m), weight 166 lb 3.6 oz (75.4 kg), SpO2 93 %.  .  Intake/Output Summary (Last 24 hours) at 07/24/16 1711 Last data filed at 07/24/16 0950  Gross per 24 hour  Intake              720 ml  Output               50 ml  Net              670 ml    Exam VITAL SIGNS: Blood pressure 115/71, pulse 91, temperature 98.2 F (36.8 C), temperature source Oral, resp. rate 16, height 5\' 8"  (1.727 m), weight 166 lb 3.6 oz (75.4 kg), SpO2 93 %.  GENERAL:  61 y.o.-year-old patient lying in the bed with no acute distress.  EYES: Pupils equal, round, reactive to light and accommodation. No scleral icterus. Extraocular muscles intact.  HEENT: Head atraumatic, normocephalic. Oropharynx and nasopharynx clear.  NECK:  Supple, no jugular venous distention. No thyroid enlargement, no tenderness.  LUNGS: Normal breath sounds bilaterally, no wheezing, rales,rhonchi or crepitation. No use of accessory muscles of respiration.  CARDIOVASCULAR: S1, S2 normal. No murmurs, rubs, or  gallops.  ABDOMEN: Soft, nontender, nondistended. Bowel sounds present. No organomegaly or mass.  EXTREMITIES:Right groin there is dressing in place bilateral foot there is dressing in place  NEUROLOGIC: Cranial nerves II through XII are intact. Muscle strength 5/5 in all extremities. Sensation intact. Gait not checked.  PSYCHIATRIC: The patient is alert and oriented x 3.  SKIN: No obvious rash, lesion, or ulcer.   Data Review     CBC w Diff:  Lab Results  Component Value Date   WBC 5.7 07/24/2016   HGB 8.3 (L) 07/24/2016   HCT 24.3 (L) 07/24/2016   PLT 208 07/24/2016   LYMPHOPCT 13 07/20/2016   MONOPCT 9 07/20/2016   EOSPCT 3 07/20/2016   BASOPCT 1 07/20/2016   CMP:  Lab Results  Component Value Date   NA 136 07/23/2016   K 4.3 07/23/2016   CL 97 (L) 07/23/2016   CO2 30 07/23/2016   BUN 25 (H) 07/23/2016   CREATININE 6.43 (H) 07/23/2016   PROT 7.1 07/21/2016   ALBUMIN 2.6 (L) 07/21/2016   BILITOT 0.6 07/21/2016   ALKPHOS 115 07/21/2016   AST 20 07/21/2016   ALT 15 (L) 07/21/2016  .  Micro Results No results found for this or any previous visit (from the past 240 hour(s)).      Code Status Orders        Start     Ordered   07/20/16 1045  Full code  Continuous     07/20/16 1044    Code Status History    Date Active Date Inactive Code Status Order ID Comments User Context   06/24/2016  2:41 PM 07/08/2016  6:16 PM Full Code [id number]  Houston Siren, MD Inpatient  01/28/2016 11:52 AM 01/28/2016  4:07 PM Full Code [id number]  Schnier, Latina Craver, MD Inpatient           Contact information for follow-up providers    Linus Galas, DPM Follow up on 07/30/2016.   Specialty:  Podiatry Why:  AT 9AM Contact information: [address] [place] [address] [phone]        Schnier, Latina Craver, MD Follow up in 1 week(s).   Specialties:  Vascular Surgery, Cardiology, Radiology, Vascular Surgery Why:  groin infection from vacular procedure Contact  information: [address] [place] [address] [phone]            Contact information for after-discharge care    Destination    [facility] Follow up.   Specialty:  Skilled Nursing Facility Contact information: [address] [place] [address] [phone]                  Discharge Medications   Allergies as of 07/24/2016   No Known Allergies     Medication List    STOP taking these medications   insulin regular 100 units/mL injection Commonly known as:  NOVOLIN R,HUMULIN R     TAKE these medications   acetaminophen 325 MG tablet Commonly known as:  TYLENOL Take 2 tablets (650 mg total) by mouth every 6 (six) hours as needed for mild pain (or Fever >/= 101).   amLODipine 2.5 MG tablet Commonly known as:  NORVASC Take 2.5 mg by mouth daily.   amoxicillin-clavulanate 500-125 MG tablet Commonly known as:  AUGMENTIN Take 1 tablet (500 mg total) by mouth every 12 (twelve) hours.   aspirin EC 81 MG tablet Take 1 tablet (81 mg total) by mouth daily.   atorvastatin 80 MG tablet Commonly known as:  LIPITOR Take 80 mg by mouth every evening.   carvedilol 25 MG tablet Commonly known as:  COREG Take 25 mg by mouth 2 (two) times daily with a meal.   docusate sodium 100 MG capsule Commonly known as:  COLACE Take 2 capsules (200 mg total) by mouth 2 (two) times daily.   feeding supplement (NEPRO CARB STEADY) Liqd Take 237 mLs by mouth 2 (two) times daily between meals.   glipiZIDE 5 MG tablet Commonly known as:  GLUCOTROL Take 5 mg by mouth 2 (two) times daily.   insulin aspart 100 UNIT/ML injection Commonly known as:  novoLOG Inject 0-5 Units into the skin at bedtime. ssi insulin 0-150 no insulin 151-200 2 units 201-250 3 units 251-300 4 units 301-350 5 untis 351-400 6 units If greater then 401 call md   insulin glargine 100 UNIT/ML injection Commonly known as:  LANTUS Inject 21 Units into the skin at  bedtime. What changed:  Another medication with the same name was removed. Continue taking this medication, and follow the directions you see here.   ipratropium-albuterol 0.5-2.5 (3) MG/3ML Soln Commonly known as:  DUONEB Take 3 mLs by nebulization every 6 (six) hours as needed.   isosorbide mononitrate 30 MG 24 hr tablet Commonly known as:  IMDUR Take 30 mg by mouth daily.   lisinopril 2.5 MG tablet Commonly known as:  PRINIVIL,ZESTRIL Take 2.5 mg by mouth daily.   megestrol 20 MG tablet Commonly known as:  MEGACE Take 20 mg by mouth daily.   Melatonin 3 MG Tabs Take 3 mg by mouth at bedtime.   nitroGLYCERIN 0.4 MG SL tablet Commonly known as:  NITROSTAT Place 0.4 mg under  the tongue every 5 (five) minutes as needed for chest pain.   ondansetron 4 MG tablet Commonly known as:  ZOFRAN Take 1 tablet (4 mg total) by mouth every 6 (six) hours as needed for nausea.   Oxycodone HCl 20 MG Tabs Take 1 tablet (20 mg total) by mouth every 4 (four) hours as needed for moderate pain or breakthrough pain (mild pain). What changed:  medication strength  how much to take   oxyCODONE 15 mg 12 hr tablet Commonly known as:  OXYCONTIN Take 1 tablet (15 mg total) by mouth every 12 (twelve) hours. What changed:  You were already taking a medication with the same name, and this prescription was added. Make sure you understand how and when to take each.   polyethylene glycol packet Commonly known as:  MIRALAX / GLYCOLAX Take 17 g by mouth daily as needed for moderate constipation.   pregabalin 75 MG capsule Commonly known as:  LYRICA Take 1 capsule (75 mg total) by mouth 2 (two) times daily.   senna-docusate 8.6-50 MG tablet Commonly known as:  Senokot-S Take 1 tablet by mouth at bedtime as needed for mild constipation.   sevelamer carbonate 800 MG tablet Commonly known as:  RENVELA Take 1,600 mg by mouth 3 (three) times daily with meals.   tamsulosin 0.4 MG Caps  capsule Commonly known as:  FLOMAX Take 0.4 mg by mouth at bedtime.   traZODone 50 MG tablet Commonly known as:  DESYREL Take 0.5 tablets (25 mg total) by mouth at bedtime as needed for sleep.   warfarin 5 MG tablet Commonly known as:  COUMADIN Take 1 tablet (5 mg total) by mouth daily at 6 PM. 5 mg once daily for 2 days. Repeat PT/INR on May 10 further management of Coumadin by the nursing home physician          Total Time in preparing paper work, data evaluation and todays exam - 35 minutes  Auburn Bilberry M.D on 07/24/2016 at 5:11 [facility]  [facility]   Office  [phone]

## 2016-07-24 NOTE — Progress Notes (Signed)
Patient is being discharged back to [facility] room 611. Report given to Konrad DoloresKim Hicks. IV removed, NT to get patient ready for transportation. EMS called.

## 2016-07-24 NOTE — Discharge Instructions (Signed)
[facility] - [place] at [facility]  DIET:  Renal and carohydrate consistent diet  DISCHARGE CONDITION:  Stable  ACTIVITY:  Weight beating as tolerated with surgical shoe  OXYGEN:  Home Oxygen: No.   Oxygen Delivery: room air  DISCHARGE LOCATION:  nursing home    ADDITIONAL DISCHARGE INSTRUCTION: Paint the right groin incision with Betadine and place a dry gauze over the top of the incision. Dressed the wound twice per day    If you experience worsening of your admission symptoms, develop shortness of breath, life threatening emergency, suicidal or homicidal thoughts you must seek medical attention immediately by calling 911 or calling your MD immediately  if symptoms less severe.  You Must read complete instructions/literature along with all the possible adverse reactions/side effects for all the Medicines you take and that have been prescribed to you. Take any new Medicines after you have completely understood and accpet all the possible adverse reactions/side effects.   Please note  You were cared for by a hospitalist during your hospital stay. If you have any questions about your discharge medications or the care you received while you were in the hospital after you are discharged, you can call the unit and asked to speak with the hospitalist on call if the hospitalist that took care of you is not available. Once you are discharged, your primary care physician will handle any further medical issues. Please note that NO REFILLS for any discharge medications will be authorized once you are discharged, as it is imperative that you return to your primary care physician (or establish a relationship with a primary care physician if you do not have one) for your aftercare needs so that they can reassess your need for medications and monitor your lab values.

## 2016-07-24 NOTE — Progress Notes (Signed)
[facility]  ROUNDING NOTE   Subjective:   History taken with assistance of Spanish Interpreter.   Hemodialysis yesterday. Tolerated treatment well. UF of 1 litre.   Dressings changed this morning by Dr. Alberteen Spindle  Objective:  Vital signs in last 24 hours:  Temp:  [97.5 F (36.4 C)-98.4 F (36.9 C)] 98.2 F (36.8 C) (05/25 0734) Pulse Rate:  [84-111] 84 (05/25 0734) Resp:  [9-22] 19 (05/25 0734) BP: (105-140)/(64-85) 115/64 (05/25 0734) SpO2:  [89 %-100 %] 89 % (05/25 0734) Weight:  [75.2 kg (165 lb 12.6 oz)-75.4 kg (166 lb 3.6 oz)] 75.4 kg (166 lb 3.6 oz) (05/25 0500)  Weight change: -0.412 kg (-14.6 oz) Filed Weights   07/23/16 0930 07/23/16 1425 07/24/16 0500  Weight: 76.2 kg (167 lb 15.9 oz) 75.2 kg (165 lb 12.6 oz) 75.4 kg (166 lb 3.6 oz)    Intake/Output: I/O last 3 completed shifts: In: 460 [P.O.:360; IV Piggyback:100] Out: 1150 [Urine:150; Other:1000]   Intake/Output this shift:  Total I/O In: 360 [P.O.:360] Out: -   Physical Exam: General: No acute distress  Head: Normocephalic, atraumatic. Moist oral mucosal membranes  Eyes: Anicteric  Neck: Supple, trachea midline  Lungs:  Clear to auscultation, normal effort  Heart: regular  Abdomen:  Soft, nontender,    Extremities: No peripheral edema. Right first toe amputation, Second left toe amputation  Neurologic: Nonfocal, moving all four extremities  Skin: No lesions  Access: LUE AVF    Basic Metabolic Panel:  Recent Labs Lab 07/20/16 1006 07/21/16 0335 07/23/16 0347  NA 133* 134* 136  K 3.3* 5.0 4.3  CL 95* 99* 97*  CO2 28 27 30   GLUCOSE 131* 101* 125*  BUN 27* 33* 25*  CREATININE 5.65* 6.61* 6.43*  CALCIUM 8.4* 8.3* 8.0*    Liver Function Tests:  Recent Labs Lab 07/20/16 1006 07/21/16 0335  AST 23 20  ALT 17 15*  ALKPHOS 109 115  BILITOT 0.6 0.6  PROT 7.4 7.1  ALBUMIN 2.8* 2.6*   No results for input(s): LIPASE, AMYLASE in the last 168 hours. No results for input(s):  AMMONIA in the last 168 hours.  CBC:  Recent Labs Lab 07/20/16 1006 07/21/16 0335 07/23/16 0347 07/24/16 0334  WBC 5.9 7.3 4.8 5.7  NEUTROABS 4.3  --   --   --   HGB 9.9* 9.6* 9.1* 8.3*  HCT 29.4* 28.9* 27.1* 24.3*  MCV 87.5 87.1 89.1 89.0  PLT 287 304 236 208    Cardiac Enzymes: No results for input(s): CKTOTAL, CKMB, CKMBINDEX, TROPONINI in the last 168 hours.  BNP: Invalid input(s): POCBNP  CBG:  Recent Labs Lab 07/23/16 0741 07/23/16 1439 07/23/16 1632 07/23/16 2107 07/24/16 0743  GLUCAP 117* 127* 156* 167* 128*    Microbiology: Results for orders placed or performed during the hospital encounter of 06/24/16  MRSA PCR Screening     Status: None   Collection Time: 06/24/16  5:05 AM  Result Value Ref Range Status   MRSA by PCR NEGATIVE NEGATIVE Final    Comment:        The GeneXpert MRSA Assay (FDA approved for NASAL specimens only), is one component of a comprehensive MRSA colonization surveillance program. It is not intended to diagnose MRSA infection nor to guide or monitor treatment for MRSA infections.   CULTURE, BLOOD (ROUTINE X 2) w Reflex to ID Panel     Status: None   Collection Time: 06/27/16  7:22 AM  Result Value Ref Range Status   Specimen  Description BLOOD RIGHT HAND  Final   Special Requests   Final    BOTTLES DRAWN AEROBIC AND ANAEROBIC Blood Culture adequate volume   Culture NO GROWTH 5 DAYS  Final   Report Status 07/02/2016 FINAL  Final  CULTURE, BLOOD (ROUTINE X 2) w Reflex to ID Panel     Status: None   Collection Time: 06/27/16  7:29 AM  Result Value Ref Range Status   Specimen Description BLOOD RIGHT ASSIST CONTROL  Final   Special Requests   Final    BOTTLES DRAWN AEROBIC AND ANAEROBIC Blood Culture adequate volume   Culture NO GROWTH 5 DAYS  Final   Report Status 07/02/2016 FINAL  Final    Coagulation Studies:  Recent Labs  07/22/16 0323 07/23/16 0347 07/24/16 0334  LABPROT 32.9* 32.3* 22.1*  INR 3.13 3.06 1.90     Urinalysis: No results for input(s): COLORURINE, LABSPEC, PHURINE, GLUCOSEU, HGBUR, BILIRUBINUR, KETONESUR, PROTEINUR, UROBILINOGEN, NITRITE, LEUKOCYTESUR in the last 72 hours.  Invalid input(s): APPERANCEUR    Imaging: No results found.   Medications:   . piperacillin-tazobactam (ZOSYN)  IV 3.375 g (07/24/16 0923)   . atorvastatin  80 mg Oral QPM  . epoetin (EPOGEN/PROCRIT) injection  10,000 Units Intravenous Q T,Th,Sa-HD  . ferric citrate  420 mg Oral TID WC  . insulin aspart  0-5 Units Subcutaneous QHS  . insulin aspart  0-9 Units Subcutaneous TID WC  . metoprolol tartrate  12.5 mg Oral BID  . oxyCODONE  10 mg Oral Q12H  . tamsulosin  0.4 mg Oral QHS   acetaminophen **OR** acetaminophen, ipratropium-albuterol, metoprolol tartrate, morphine injection, nitroGLYCERIN, ondansetron **OR** ondansetron (ZOFRAN) IV, ondansetron (ZOFRAN) IV, oxyCODONE, polyethylene glycol, senna-docusate, traZODone  Assessment/ Plan:  61 y.o. Hispanic male ESRD on HD TTS, coronary artery disease, diabetes mellitus type 2, hyperlipidemia, hypertension, history of CVA, anemia chronic kidney disease, secondary hyperparathyroidism   [facility] TTS [address] Left AVF   1.  ESRD on HD TTS:  Tolerated hemodialysis yesterday.  Continue TTS schedule   2.  Anemia chronic kidney disease:  Hemoglobin 8.3 - Epogen 10000 with dialysis.    3.  Secondary hyperparathyroidism.   - Auryxia with meals.   4. 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 Alberteen Spindleline on 5/23.   5. Hypertension: blood pressure at goal.  - tamsulosin     LOS: 4 Melissa Tomaselli 5/25/201810:06 AM

## 2016-07-24 NOTE — Progress Notes (Signed)
Nursing trying to get patient's post surgical boot. NT was able to go up to get post op to get but they only had one boot. [facility] to [facility].

## 2016-07-28 ENCOUNTER — Emergency Department: Payer: Medicare Other

## 2016-07-28 ENCOUNTER — Inpatient Hospital Stay
Admission: EM | Admit: 2016-07-28 | Discharge: 2016-08-30 | DRG: 871 | Disposition: E | Payer: Medicare Other | Attending: Specialist | Admitting: Specialist

## 2016-07-28 ENCOUNTER — Encounter: Payer: Self-pay | Admitting: Emergency Medicine

## 2016-07-28 DIAGNOSIS — Z833 Family history of diabetes mellitus: Secondary | ICD-10-CM

## 2016-07-28 DIAGNOSIS — Z8673 Personal history of transient ischemic attack (TIA), and cerebral infarction without residual deficits: Secondary | ICD-10-CM

## 2016-07-28 DIAGNOSIS — Z66 Do not resuscitate: Secondary | ICD-10-CM | POA: Diagnosis present

## 2016-07-28 DIAGNOSIS — Z794 Long term (current) use of insulin: Secondary | ICD-10-CM

## 2016-07-28 DIAGNOSIS — Z7982 Long term (current) use of aspirin: Secondary | ICD-10-CM

## 2016-07-28 DIAGNOSIS — D62 Acute posthemorrhagic anemia: Secondary | ICD-10-CM | POA: Diagnosis present

## 2016-07-28 DIAGNOSIS — G9341 Metabolic encephalopathy: Secondary | ICD-10-CM | POA: Diagnosis present

## 2016-07-28 DIAGNOSIS — I6523 Occlusion and stenosis of bilateral carotid arteries: Secondary | ICD-10-CM | POA: Diagnosis present

## 2016-07-28 DIAGNOSIS — I639 Cerebral infarction, unspecified: Secondary | ICD-10-CM | POA: Diagnosis not present

## 2016-07-28 DIAGNOSIS — I252 Old myocardial infarction: Secondary | ICD-10-CM

## 2016-07-28 DIAGNOSIS — Z515 Encounter for palliative care: Secondary | ICD-10-CM | POA: Diagnosis present

## 2016-07-28 DIAGNOSIS — E1151 Type 2 diabetes mellitus with diabetic peripheral angiopathy without gangrene: Secondary | ICD-10-CM | POA: Diagnosis present

## 2016-07-28 DIAGNOSIS — R1310 Dysphagia, unspecified: Secondary | ICD-10-CM | POA: Diagnosis not present

## 2016-07-28 DIAGNOSIS — E785 Hyperlipidemia, unspecified: Secondary | ICD-10-CM | POA: Diagnosis present

## 2016-07-28 DIAGNOSIS — I2510 Atherosclerotic heart disease of native coronary artery without angina pectoris: Secondary | ICD-10-CM | POA: Diagnosis present

## 2016-07-28 DIAGNOSIS — N2581 Secondary hyperparathyroidism of renal origin: Secondary | ICD-10-CM | POA: Diagnosis present

## 2016-07-28 DIAGNOSIS — I255 Ischemic cardiomyopathy: Secondary | ICD-10-CM | POA: Diagnosis present

## 2016-07-28 DIAGNOSIS — I5023 Acute on chronic systolic (congestive) heart failure: Secondary | ICD-10-CM | POA: Diagnosis present

## 2016-07-28 DIAGNOSIS — I6340 Cerebral infarction due to embolism of unspecified cerebral artery: Secondary | ICD-10-CM | POA: Diagnosis not present

## 2016-07-28 DIAGNOSIS — E1122 Type 2 diabetes mellitus with diabetic chronic kidney disease: Secondary | ICD-10-CM | POA: Diagnosis present

## 2016-07-28 DIAGNOSIS — I608 Other nontraumatic subarachnoid hemorrhage: Secondary | ICD-10-CM | POA: Diagnosis not present

## 2016-07-28 DIAGNOSIS — I959 Hypotension, unspecified: Secondary | ICD-10-CM | POA: Diagnosis present

## 2016-07-28 DIAGNOSIS — D649 Anemia, unspecified: Secondary | ICD-10-CM

## 2016-07-28 DIAGNOSIS — Z89412 Acquired absence of left great toe: Secondary | ICD-10-CM

## 2016-07-28 DIAGNOSIS — Z7901 Long term (current) use of anticoagulants: Secondary | ICD-10-CM | POA: Diagnosis not present

## 2016-07-28 DIAGNOSIS — Z89421 Acquired absence of other right toe(s): Secondary | ICD-10-CM

## 2016-07-28 DIAGNOSIS — Z7189 Other specified counseling: Secondary | ICD-10-CM | POA: Diagnosis not present

## 2016-07-28 DIAGNOSIS — N186 End stage renal disease: Secondary | ICD-10-CM | POA: Diagnosis present

## 2016-07-28 DIAGNOSIS — I611 Nontraumatic intracerebral hemorrhage in hemisphere, cortical: Secondary | ICD-10-CM | POA: Diagnosis not present

## 2016-07-28 DIAGNOSIS — I480 Paroxysmal atrial fibrillation: Secondary | ICD-10-CM | POA: Diagnosis present

## 2016-07-28 DIAGNOSIS — I132 Hypertensive heart and chronic kidney disease with heart failure and with stage 5 chronic kidney disease, or end stage renal disease: Secondary | ICD-10-CM | POA: Diagnosis present

## 2016-07-28 DIAGNOSIS — I360 Nonrheumatic tricuspid (valve) stenosis: Secondary | ICD-10-CM | POA: Diagnosis not present

## 2016-07-28 DIAGNOSIS — A419 Sepsis, unspecified organism: Principal | ICD-10-CM

## 2016-07-28 DIAGNOSIS — G9340 Encephalopathy, unspecified: Secondary | ICD-10-CM | POA: Diagnosis not present

## 2016-07-28 DIAGNOSIS — Z992 Dependence on renal dialysis: Secondary | ICD-10-CM

## 2016-07-28 DIAGNOSIS — R791 Abnormal coagulation profile: Secondary | ICD-10-CM | POA: Diagnosis present

## 2016-07-28 DIAGNOSIS — Z79899 Other long term (current) drug therapy: Secondary | ICD-10-CM

## 2016-07-28 DIAGNOSIS — E1165 Type 2 diabetes mellitus with hyperglycemia: Secondary | ICD-10-CM | POA: Diagnosis not present

## 2016-07-28 DIAGNOSIS — I6310 Cerebral infarction due to embolism of unspecified precerebral artery: Secondary | ICD-10-CM | POA: Diagnosis not present

## 2016-07-28 DIAGNOSIS — Z87891 Personal history of nicotine dependence: Secondary | ICD-10-CM

## 2016-07-28 DIAGNOSIS — R471 Dysarthria and anarthria: Secondary | ICD-10-CM | POA: Diagnosis present

## 2016-07-28 DIAGNOSIS — R109 Unspecified abdominal pain: Secondary | ICD-10-CM

## 2016-07-28 DIAGNOSIS — R410 Disorientation, unspecified: Secondary | ICD-10-CM

## 2016-07-28 DIAGNOSIS — D631 Anemia in chronic kidney disease: Secondary | ICD-10-CM | POA: Diagnosis present

## 2016-07-28 DIAGNOSIS — E162 Hypoglycemia, unspecified: Secondary | ICD-10-CM

## 2016-07-28 DIAGNOSIS — E11649 Type 2 diabetes mellitus with hypoglycemia without coma: Secondary | ICD-10-CM | POA: Diagnosis present

## 2016-07-28 DIAGNOSIS — K529 Noninfective gastroenteritis and colitis, unspecified: Secondary | ICD-10-CM | POA: Diagnosis present

## 2016-07-28 DIAGNOSIS — Z79891 Long term (current) use of opiate analgesic: Secondary | ICD-10-CM

## 2016-07-28 DIAGNOSIS — Z951 Presence of aortocoronary bypass graft: Secondary | ICD-10-CM

## 2016-07-28 DIAGNOSIS — R4182 Altered mental status, unspecified: Secondary | ICD-10-CM

## 2016-07-28 HISTORY — DX: Ischemic cardiomyopathy: I25.5

## 2016-07-28 HISTORY — DX: Peripheral vascular disease, unspecified: I73.9

## 2016-07-28 HISTORY — DX: Chronic systolic (congestive) heart failure: I50.22

## 2016-07-28 HISTORY — DX: Paroxysmal atrial fibrillation: I48.0

## 2016-07-28 LAB — COMPREHENSIVE METABOLIC PANEL
ALBUMIN: 2.3 g/dL — AB (ref 3.5–5.0)
ALK PHOS: 262 U/L — AB (ref 38–126)
ALT: 21 U/L (ref 17–63)
ANION GAP: 11 (ref 5–15)
AST: 32 U/L (ref 15–41)
BILIRUBIN TOTAL: 0.7 mg/dL (ref 0.3–1.2)
BUN: 47 mg/dL — ABNORMAL HIGH (ref 6–20)
CALCIUM: 8.1 mg/dL — AB (ref 8.9–10.3)
CO2: 27 mmol/L (ref 22–32)
Chloride: 93 mmol/L — ABNORMAL LOW (ref 101–111)
Creatinine, Ser: 8.6 mg/dL — ABNORMAL HIGH (ref 0.61–1.24)
GFR calc Af Amer: 7 mL/min — ABNORMAL LOW (ref >60)
GFR calc non Af Amer: 6 mL/min — ABNORMAL LOW (ref >60)
Glucose, Bld: 56 mg/dL — ABNORMAL LOW (ref 65–99)
Potassium: 4.7 mmol/L (ref 3.5–5.1)
SODIUM: 131 mmol/L — AB (ref 135–145)
TOTAL PROTEIN: 6.7 g/dL (ref 6.5–8.1)

## 2016-07-28 LAB — CBC WITH DIFFERENTIAL/PLATELET
BASOS ABS: 0 10*3/uL (ref 0–0.1)
Basophils Relative: 0 %
EOS PCT: 3 %
Eosinophils Absolute: 0.2 10*3/uL (ref 0–0.7)
HCT: 20.1 % — ABNORMAL LOW (ref 40.0–52.0)
Hemoglobin: 6.7 g/dL — ABNORMAL LOW (ref 13.0–18.0)
Lymphocytes Relative: 9 %
Lymphs Abs: 0.6 10*3/uL — ABNORMAL LOW (ref 1.0–3.6)
MCH: 29.9 pg (ref 26.0–34.0)
MCHC: 33.5 g/dL (ref 32.0–36.0)
MCV: 89 fL (ref 80.0–100.0)
Monocytes Absolute: 0.5 10*3/uL (ref 0.2–1.0)
Monocytes Relative: 7 %
NEUTROS ABS: 5.7 10*3/uL (ref 1.4–6.5)
Neutrophils Relative %: 81 %
PLATELETS: 208 10*3/uL (ref 150–440)
RBC: 2.26 MIL/uL — AB (ref 4.40–5.90)
RDW: 17.3 % — ABNORMAL HIGH (ref 11.5–14.5)
WBC: 7.1 10*3/uL (ref 3.8–10.6)

## 2016-07-28 LAB — PREPARE RBC (CROSSMATCH)

## 2016-07-28 LAB — GLUCOSE, CAPILLARY
GLUCOSE-CAPILLARY: 140 mg/dL — AB (ref 65–99)
GLUCOSE-CAPILLARY: 90 mg/dL (ref 65–99)
GLUCOSE-CAPILLARY: 83 mg/dL (ref 65–99)
Glucose-Capillary: 46 mg/dL — ABNORMAL LOW (ref 65–99)
Glucose-Capillary: 63 mg/dL — ABNORMAL LOW (ref 65–99)
Glucose-Capillary: 80 mg/dL (ref 65–99)

## 2016-07-28 LAB — PROCALCITONIN: Procalcitonin: 0.64 ng/mL

## 2016-07-28 LAB — TROPONIN I
Troponin I: 0.51 ng/mL (ref <0.03)
Troponin I: 0.53 ng/mL (ref <0.03)
Troponin I: 0.45 ng/mL (ref <0.03)

## 2016-07-28 LAB — MRSA PCR SCREENING: MRSA by PCR: NEGATIVE

## 2016-07-28 LAB — BLOOD GAS, VENOUS
ACID-BASE EXCESS: 7.6 mmol/L — AB (ref 0.0–2.0)
BICARBONATE: 32 mmol/L — AB (ref 20.0–28.0)
PCO2 VEN: 44 mmHg (ref 44.0–60.0)
Patient temperature: 37
pH, Ven: 7.47 — ABNORMAL HIGH (ref 7.250–7.430)

## 2016-07-28 LAB — PROTIME-INR
INR: 2.72
Prothrombin Time: 29.4 seconds — ABNORMAL HIGH (ref 11.4–15.2)

## 2016-07-28 LAB — HEMOGLOBIN: Hemoglobin: 7.1 g/dL — ABNORMAL LOW (ref 13.0–18.0)

## 2016-07-28 LAB — LACTIC ACID, PLASMA: Lactic Acid, Venous: 0.8 mmol/L (ref 0.5–1.9)

## 2016-07-28 MED ORDER — PIPERACILLIN-TAZOBACTAM 3.375 G IVPB: 3.3750 g | Freq: Two times a day (BID) | INTRAVENOUS | Status: DC

## 2016-07-28 MED ORDER — DEXTROSE 10 % IV SOLN
INTRAVENOUS | Status: DC
  Administered 2016-07-28 – 2016-07-31 (×3): via INTRAVENOUS

## 2016-07-28 MED ORDER — POLYETHYLENE GLYCOL 3350 17 G PO PACK: 17.0000 g | PACK | Freq: Every day | ORAL | Status: DC | PRN

## 2016-07-28 MED ORDER — PIPERACILLIN-TAZOBACTAM 3.375 G IVPB 30 MIN
3.3750 g | Freq: Once | INTRAVENOUS | Status: AC
  Administered 2016-07-28: 3.375 g via INTRAVENOUS
  Filled 2016-07-28: qty 50

## 2016-07-28 MED ORDER — DEXTROSE 50 % IV SOLN
50.0000 mL | Freq: Once | INTRAVENOUS | Status: AC
  Administered 2016-07-28: 50 mL via INTRAVENOUS

## 2016-07-28 MED ORDER — DEXTROSE 50 % IV SOLN
INTRAVENOUS | Status: AC
  Administered 2016-07-28: 23:00:00
  Filled 2016-07-28: qty 50

## 2016-07-28 MED ORDER — NALOXONE HCL 2 MG/2ML IJ SOSY
0.4000 mg | PREFILLED_SYRINGE | Freq: Once | INTRAMUSCULAR | Status: AC
  Administered 2016-07-28: 0.4 mg via INTRAVENOUS

## 2016-07-28 MED ORDER — ONDANSETRON HCL 4 MG/2ML IJ SOLN
4.0000 mg | Freq: Four times a day (QID) | INTRAMUSCULAR | Status: DC | PRN
  Administered 2016-07-30: 4 mg via INTRAVENOUS

## 2016-07-28 MED ORDER — DEXTROSE 50 % IV SOLN
25.0000 g | Freq: Once | INTRAVENOUS | Status: AC
  Administered 2016-07-28: 25 g via INTRAVENOUS
  Filled 2016-07-28: qty 50

## 2016-07-28 MED ORDER — ACETAMINOPHEN 650 MG RE SUPP: 650.0000 mg | Freq: Four times a day (QID) | RECTAL | Status: DC | PRN

## 2016-07-28 MED ORDER — VANCOMYCIN HCL IN DEXTROSE 750-5 MG/150ML-% IV SOLN
750.0000 mg | INTRAVENOUS | Status: DC
  Administered 2016-07-29: 750 mg via INTRAVENOUS
  Filled 2016-07-28 (×2): qty 150

## 2016-07-28 MED ORDER — VANCOMYCIN HCL IN DEXTROSE 1-5 GM/200ML-% IV SOLN
1000.0000 mg | Freq: Once | INTRAVENOUS | Status: AC
  Administered 2016-07-28: 1000 mg via INTRAVENOUS
  Filled 2016-07-28: qty 200

## 2016-07-28 MED ORDER — NALOXONE HCL 2 MG/2ML IJ SOSY
PREFILLED_SYRINGE | INTRAMUSCULAR | Status: AC
  Administered 2016-07-28: 0.4 mg via INTRAVENOUS
  Filled 2016-07-28: qty 2

## 2016-07-28 MED ORDER — ACETAMINOPHEN 325 MG PO TABS
650.0000 mg | ORAL_TABLET | Freq: Four times a day (QID) | ORAL | Status: DC | PRN
  Administered 2016-07-28: 650 mg via ORAL
  Filled 2016-07-28: qty 2

## 2016-07-28 MED ORDER — DEXTROSE 50 % IV SOLN
INTRAVENOUS | Status: AC
  Administered 2016-07-28: 50 mL
  Filled 2016-07-28: qty 50

## 2016-07-28 MED ORDER — SODIUM CHLORIDE 0.9 % IV SOLN: 10.0000 mL/h | Freq: Once | INTRAVENOUS | Status: DC

## 2016-07-28 MED ORDER — DEXTROSE 5 % IV SOLN: INTRAVENOUS | Status: DC

## 2016-07-28 MED ORDER — VANCOMYCIN HCL IN DEXTROSE 750-5 MG/150ML-% IV SOLN
750.0000 mg | Freq: Once | INTRAVENOUS | Status: AC
  Administered 2016-07-28: 750 mg via INTRAVENOUS
  Filled 2016-07-28: qty 150

## 2016-07-28 MED ORDER — SODIUM CHLORIDE 0.9% FLUSH
3.0000 mL | Freq: Two times a day (BID) | INTRAVENOUS | Status: DC
  Administered 2016-07-28 – 2016-08-01 (×6): 3 mL via INTRAVENOUS

## 2016-07-28 MED ORDER — INSULIN ASPART 100 UNIT/ML ~~LOC~~ SOLN
0.0000 [IU] | SUBCUTANEOUS | Status: DC
  Administered 2016-07-29 – 2016-07-30 (×2): 2 [IU] via SUBCUTANEOUS
  Administered 2016-07-31: 3 [IU] via SUBCUTANEOUS
  Administered 2016-07-31: 1 [IU] via SUBCUTANEOUS
  Filled 2016-07-28 (×2): qty 2
  Filled 2016-07-28: qty 1

## 2016-07-28 MED ORDER — PIPERACILLIN-TAZOBACTAM 3.375 G IVPB
3.3750 g | Freq: Two times a day (BID) | INTRAVENOUS | Status: DC
  Administered 2016-07-28 – 2016-07-31 (×6): 3.375 g via INTRAVENOUS
  Filled 2016-07-28 (×8): qty 50

## 2016-07-28 MED ORDER — ALBUTEROL SULFATE (2.5 MG/3ML) 0.083% IN NEBU: 2.5000 mg | INHALATION_SOLUTION | RESPIRATORY_TRACT | Status: DC | PRN

## 2016-07-28 MED ORDER — ONDANSETRON HCL 4 MG PO TABS: 4.0000 mg | ORAL_TABLET | Freq: Four times a day (QID) | ORAL | Status: DC | PRN

## 2016-07-28 NOTE — ED Triage Notes (Signed)
Patient from [facility] via [facility]. Facility reports patient was at baseline during 1 am vitals. Per EMS when staff did morning rounds, they found patient unresponsive and hypotensive. Upon arrival to ED, patient is responsive to painful stimuli only . Shallow, slow respirations noted.

## 2016-07-28 NOTE — ED Provider Notes (Signed)
[facility] Emergency Department Provider Note       Time seen: ----------------------------------------- 10:25 AM on 07/29/2016 -----------------------------------------  L5 caveat: Review of systems and history is limited by altered mental status   I have reviewed the triage vital signs and the nursing notes.   HISTORY   Chief Complaint Altered Mental Status    HPI Eddie Myers is a 61 y.o. male who presents to the ED for altered mental status. Patient was recently seen in the hospital and had bilateral toe amputations. He is currently in a nursing home and was sent from the nursing home for altered mental status. He is a dialysis patient and reportedly needs dialysis today. He was found to be febrile, no other information is available.    Past Medical History:  Diagnosis Date  . Anginal pain (HCC)   . Coronary artery disease   . Diabetes mellitus without complication (HCC)   . Dialysis patient (HCC)    Tues, Thurs, Sat  . Dyspnea    with exertion  . Elevated lipids   . Enlarged prostate   . ESRD (end stage renal disease) (HCC)    dialysis M-W-F  . Hypertension   . Myocardial infarction (HCC) 08/2015  . Renal insufficiency   . Stroke [facility])    Lt side this year July    Patient Active Problem List   Diagnosis Date Noted  . Gangrene of foot (HCC) 07/20/2016  . Gangrene (HCC) 06/24/2016  . Vision loss of right eye 06/24/2016  . Gangrene of lower extremity (HCC) 06/24/2016  . Encounter for therapeutic drug monitoring 03/25/2016  . Paroxysmal atrial fibrillation (HCC) 03/07/2016  . Renal dialysis device, implant, or graft complication 01/22/2016  . ESRD on dialysis (HCC) 01/06/2016  . Type 2 diabetes mellitus with complication (HCC) 01/06/2016  . Anginal pain (HCC) 11/20/2015  . Hypertension 11/20/2015  . Coronary artery disease involving native coronary artery of native heart without angina pectoris 11/20/2015  . Cardiomyopathy, ischemic  11/20/2015  . Stroke [facility]) 11/20/2015    Past Surgical History:  Procedure Laterality Date  . ABDOMINAL AORTOGRAM W/LOWER EXTREMITY Left 06/26/2016   Procedure: Abdominal Aortogram w/Lower Extremity;  Surgeon: Renford Dills, MD;  Location: [facility] INVASIVE CV LAB;  Service: Cardiovascular;  Laterality: Left;  . AMPUTATION TOE Bilateral 07/22/2016   Procedure: AMPUTATION TOE LEFT GREAT TOE AND RIGHT 2ND TOE;  Surgeon: Linus Galas, DPM;  Location: [facility] ORS;  Service: Podiatry;  Laterality: Bilateral;  . AV FISTULA PLACEMENT Left 01/10/2016   Procedure: INSERTION OF ARTERIOVENOUS (AV) GORE-TEX GRAFT ARM ( BRACH / AXILLARY GRAFT );  Surgeon: Renford Dills, MD;  Location: [facility] ORS;  Service: Vascular;  Laterality: Left;  . CARDIAC CATHETERIZATION    . CORONARY ARTERY BYPASS GRAFT  01/2014   [facility] (LIMA -> LAD and SVG -> OM)  . INSERTION OF DIALYSIS CATHETER Right    Perma catheter  . LOWER EXTREMITY ANGIOGRAPHY  07/03/2016   Procedure: Lower Extremity Angiography;  Surgeon: Renford Dills, MD;  Location: [facility] INVASIVE CV LAB;  Service: Cardiovascular;;  . LOWER EXTREMITY INTERVENTION  07/03/2016   Procedure: Lower Extremity Intervention;  Surgeon: Renford Dills, MD;  Location: [facility] INVASIVE CV LAB;  Service: Cardiovascular;;  . PERIPHERAL VASCULAR BALLOON ANGIOPLASTY Left 07/01/2016   Procedure: Peripheral Vascular Balloon Angioplasty;  Surgeon: Renford Dills, MD;  Location: [facility] INVASIVE CV LAB;  Service: Cardiovascular;  Laterality: Left;  . PERIPHERAL VASCULAR BALLOON ANGIOPLASTY N/A 07/03/2016   Procedure: Peripheral  Vascular Balloon Angioplasty;  Surgeon: Renford Dills, MD;  Location[facility] INVASIVE CV LAB;  Service: Cardiovascular;  Laterality: N/A;  . PERIPHERAL VASCULAR CATHETERIZATION N/A 01/17/2016   Procedure: Thrombectomy;  Surgeon: Renford Dills, MD;  Location: [facility] INVASIVE CV LAB;  Service: Cardiovascular;  Laterality: N/A;  . PERIPHERAL VASCULAR  CATHETERIZATION Left 01/28/2016   Procedure: A/V Fistulagram;  Surgeon: Renford Dills, MD;  Location: [facility] INVASIVE CV LAB;  Service: Cardiovascular;  Laterality: Left;  . PERIPHERAL VASCULAR CATHETERIZATION N/A 03/24/2016   Procedure: Dialysis/Perma Catheter Removal;  Surgeon: Renford Dills, MD;  Location: [facility] INVASIVE CV LAB;  Service: Cardiovascular;  Laterality: N/A;  . UPPER EXTREMITY ANGIOGRAM Left 01/28/2016   Procedure: Upper Extremity Angiogram;  Surgeon: Renford Dills, MD;  Location: [facility] INVASIVE CV LAB;  Service: Cardiovascular;  Laterality: Left;    Allergies Patient has no known allergies.  Social History Social History  Substance Use Topics  . Smoking status: Former Smoker    Packs/day: 0.50    Years: 45.00    Quit date: 01/05/2015  . Smokeless tobacco: Never Used  . Alcohol use No    Review of Systems Constitutional: Positive for fever Musculoskeletal: Positive for recent toe amputations Skin: Positive for wounds on his extremities  All systems unknown except as stated in the HPI  ____________________________________________   PHYSICAL EXAM:  VITAL SIGNS: ED Triage Vitals  Enc Vitals Group     BP      Pulse      Resp      Temp      Temp src      SpO2      Weight      Height      Head Circumference      [facility] Flow      Pain Score      Pain Loc      Pain Edu?      Excl. in GC?    Constitutional: Lethargic, responsive to pain. Eyes: Conjunctivae are normal.  ENT   Head: Normocephalic and atraumatic.   Nose: No congestion/rhinnorhea.   Mouth/Throat: Mucous membranes are moist.   Neck: No stridor. Cardiovascular: Normal rate, regular rhythm. No murmurs, rubs, or gallops. Respiratory: Normal respiratory effort without tachypnea nor retractions. Breath sounds are clear and equal bilaterally. No wheezes/rales/rhonchi. Gastrointestinal: Soft and nontender. Normal bowel sounds Musculoskeletal: Limited but unremarkable range of  motion of the extremities, recent amputation of toes on both feet Neurologic:  Normal speech and language. No gross focal neurologic deficits are appreciated.  Skin:  Healing wounds from amputation on both feet. No active signs of cellulitis or purulent drainage Psychiatric: Patient is unresponsive except to pain currently. ____________________________________________  EKG: Interpreted by me. Sinus rhythm rate of 74 bpm, normal PR interval, normal QRS, normal QT. Rightward axis repolarization abnormality inferiorly and laterally  ____________________________________________  ED COURSE:  Pertinent labs & imaging results that were available during my care of the patient were reviewed by me and considered in my medical decision making (see chart for details). Patient presents for altered mental status, we will assess with labs and imaging as indicated. We will also give Narcan to try to improve his mental status.  Patient with improvement in his mental status after IV Narcan   Procedures ____________________________________________   LABS (pertinent positives/negatives)  Labs Reviewed  COMPREHENSIVE METABOLIC PANEL - Abnormal; Notable for the following:       Result Value   Sodium 131 (*)    Chloride  93 (*)    Glucose, Bld 56 (*)    BUN 47 (*)    Creatinine, Ser 8.60 (*)    Calcium 8.1 (*)    Albumin 2.3 (*)    Alkaline Phosphatase 262 (*)    GFR calc non Af Amer 6 (*)    GFR calc Af Amer 7 (*)    All other components within normal limits  TROPONIN I - Abnormal; Notable for the following:    Troponin I 0.51 (*)    All other components within normal limits  CBC WITH DIFFERENTIAL/PLATELET - Abnormal; Notable for the following:    RBC 2.26 (*)    Hemoglobin 6.7 (*)    HCT 20.1 (*)    RDW 17.3 (*)    Lymphs Abs 0.6 (*)    All other components within normal limits  BLOOD GAS, VENOUS - Abnormal; Notable for the following:    pH, Ven 7.47 (*)    Bicarbonate 32.0 (*)     Acid-Base Excess 7.6 (*)    All other components within normal limits  GLUCOSE, CAPILLARY - Abnormal; Notable for the following:    Glucose-Capillary 140 (*)    All other components within normal limits  CULTURE, BLOOD (ROUTINE X 2)  CULTURE, BLOOD (ROUTINE X 2)  URINE CULTURE  LACTIC ACID, PLASMA  URINALYSIS, COMPLETE (UACMP) WITH MICROSCOPIC  PREPARE RBC (CROSSMATCH)  TYPE AND SCREEN   CRITICAL CARE Performed by: Emily FilbertWilliams, Kambrea Carrasco E   Total critical care time: 30 minutes  Critical care time was exclusive of separately billable procedures and treating other patients.  Critical care was necessary to treat or prevent imminent or life-threatening deterioration.  Critical care was time spent personally by me on the following activities: development of treatment plan with patient and/or surrogate as well as nursing, discussions with consultants, evaluation of patient's response to treatment, examination of patient, obtaining history from patient or surrogate, ordering and performing treatments and interventions, ordering and review of laboratory studies, ordering and review of radiographic studies, pulse oximetry and re-evaluation of patient's condition.  RADIOLOGY Images were viewed by me  Chest x-ray IMPRESSION: 1. No evidence of acute intracranial abnormality. 2. Chronic small vessel ischemia with chronic lacunar infarcts as Above. IMPRESSION: Prior CABG. Cardiomegaly with mild bilateral from interstitial prominence consistent mild CHF. ____________________________________________  FINAL ASSESSMENT AND PLAN  Altered mental status, anemia, end-stage renal disease   Plan: Patient's labs and imaging were dictated above. Patient had presented for altered mental status of uncertain etiology. Possible sepsis. We initiated sepsis protocol, and started broad-spectrum antibiotics. He was also hyperglycemic and anemic. We have ordered a blood transfusion as well as given IV dextrose.  I will discuss with the hospitalist for admission.   Emily FilbertWilliams, Jemina Scahill E, MD   Note: This note was generated in part or whole with voice recognition software. Voice recognition is usually quite accurate but there are transcription errors that can and very often do occur. I apologize for any typographical errors that were not detected and corrected.     Emily FilbertWilliams, Trang Bouse E, MD 07/24/2016 [phone]

## 2016-07-28 NOTE — Progress Notes (Signed)
[facility] notified of CBG of 63. Patient on D10 infusion at 25 ml/hr. Will give amp of D50.

## 2016-07-28 NOTE — ED Notes (Signed)
CODE SEPSIS CALLED TO DOUG AT [facility] 

## 2016-07-28 NOTE — Consult Note (Signed)
Name: Eddie Myers MRN: [id number] DOB: 01/10/1956    ADMISSION DATE:  10-Aug-2016 CONSULTATION DATE:  08/10/16  REFERRING MD : Dr. Elpidio Anis  CHIEF COMPLAINT:  Altered Mental Status  BRIEF PATIENT DESCRIPTION: 61 year old male with ESRD, recent foot surgery. Presents with altered mental status and fever of 100.3. Source of sepsis is unclear.  SIGNIFICANT EVENTS  5/29>> patient admitted to the ICU with altered mental status secondary to sepsis with unclear etiology  STUDIES:  5/29 CT head>>No evidence of acute intracranial abnormality.   HISTORY OF PRESENT ILLNESS:  Eddie Myers is a 60 year old male with known history of CAD, DM, ESRD, hyperlipidemia, MI (June -2017) and stroke. Patient is in nursing home resident. He recently had bilateral toe amputation on (5/23). Patient was brought to the ED on 5/29 with concerns of altered mental status. Patient was febrile on admission with a temp of 100.3 F. Patient was noted to be anemic with a hemoglobin of 6.7mg /dl. Lactic acid normal, WBC-WNL. Patient has history of groin infection from prior intra-vascular insertion. But currently no evidence of induration or discharge from the insertion site patient was started on broad-spectrum antibiotics for sepsis as well as cultures for infection work up. Patient was sent to the ICU  For presumed sepsis with unclear etiology and was sent for closer monitoring.  PAST MEDICAL HISTORY :   has a past medical history of Anginal pain (HCC); Coronary artery disease; Diabetes mellitus without complication (HCC); Dialysis patient [facility]); Dyspnea; Elevated lipids; Enlarged prostate; ESRD (end stage renal disease) (HCC); Hypertension; Myocardial infarction (HCC) (08/2015); Renal insufficiency; and Stroke (HCC).  has a past surgical history that includes Coronary artery bypass graft (01/2014); Cardiac catheterization; Insertion of dialysis catheter (Right); Cardiac catheterization (N/A, 01/17/2016); Cardiac catheterization  (Left, 01/28/2016); Upper extremity angiogram (Left, 01/28/2016); AV fistula placement (Left, 01/10/2016); Cardiac catheterization (N/A, 03/24/2016); Abdominal Aortogram w/Lower Extremity (Left, 06/26/2016); Peripheral Vascular Balloon Angioplasty (Left, 07/01/2016); Peripheral Vascular Balloon Angioplasty (N/A, 07/03/2016); Lower Extremity Angiography (07/03/2016); Lower Extremity Intervention (07/03/2016); and Amputation toe (Bilateral, 07/22/2016). Prior to Admission medications   Medication Sig Start Date End Date Taking? Authorizing Provider  amLODipine (NORVASC) 2.5 MG tablet Take 2.5 mg by mouth daily.   Yes [provider]  amoxicillin-clavulanate (AUGMENTIN) 500-125 MG tablet Take 1 tablet (500 mg total) by mouth every 12 (twelve) hours. 07/24/16 07/30/16 Yes Auburn Bilberry, MD  aspirin EC 81 MG tablet Take 1 tablet (81 mg total) by mouth daily. 07/08/16  Yes Gouru, Deanna Artis, MD  atorvastatin (LIPITOR) 80 MG tablet Take 80 mg by mouth every evening.  02/07/16  Yes [provider]  carvedilol (COREG) 25 MG tablet Take 25 mg by mouth 2 (two) times daily with a meal.   Yes [provider]  docusate sodium (COLACE) 100 MG capsule Take 2 capsules (200 mg total) by mouth 2 (two) times daily. 07/24/16  Yes Auburn Bilberry, MD  glipiZIDE (GLUCOTROL) 5 MG tablet Take 5 mg by mouth 2 (two) times daily.    Yes [provider]  insulin glargine (LANTUS) 100 UNIT/ML injection Inject 21 Units into the skin at bedtime.   Yes [provider]  isosorbide mononitrate (IMDUR) 30 MG 24 hr tablet Take 30 mg by mouth daily.   Yes [provider]  megestrol (MEGACE) 20 MG tablet Take 20 mg by mouth daily.   Yes [provider]  Melatonin 3 MG TABS Take 3 mg by mouth at bedtime.    Yes [provider]  oxyCODONE (OXYCONTIN) 15 mg  12 hr tablet Take 1 tablet (15 mg total) by mouth every 12 (twelve) hours. 07/24/16  Yes Auburn Bilberry, MD  oxyCODONE 20 MG TABS Take 1  tablet (20 mg total) by mouth every 4 (four) hours as needed for moderate pain or breakthrough pain (mild pain). 07/24/16  Yes Auburn Bilberry, MD  pregabalin (LYRICA) 75 MG capsule Take 1 capsule (75 mg total) by mouth 2 (two) times daily. 07/24/16  Yes Auburn Bilberry, MD  sevelamer carbonate (RENVELA) 800 MG tablet Take 1,600 mg by mouth 3 (three) times daily with meals.   Yes [provider]  tamsulosin (FLOMAX) 0.4 MG CAPS capsule Take 0.4 mg by mouth at bedtime.   Yes [provider]  traZODone (DESYREL) 50 MG tablet Take 0.5 tablets (25 mg total) by mouth at bedtime as needed for sleep. 07/24/16  Yes Auburn Bilberry, MD  warfarin (COUMADIN) 5 MG tablet Take 1 tablet (5 mg total) by mouth daily at 6 PM. 5 mg once daily for 2 days. Repeat PT/INR on May 10 further management of Coumadin by the nursing home physician 07/08/16  Yes Gouru, Deanna Artis, MD  acetaminophen (TYLENOL) 325 MG tablet Take 2 tablets (650 mg total) by mouth every 6 (six) hours as needed for mild pain (or Fever >/= 101). 07/08/16   Gouru, Deanna Artis, MD  insulin aspart (NOVOLOG) 100 UNIT/ML injection Inject 0-5 Units into the skin at bedtime. ssi insulin 0-150 no insulin 151-200 2 units 201-250 3 units 251-300 4 units 301-350 5 untis 351-400 6 units If greater then 401 call md 07/24/16   Auburn Bilberry, MD  ipratropium-albuterol (DUONEB) 0.5-2.5 (3) MG/3ML SOLN Take 3 mLs by nebulization every 6 (six) hours as needed. 07/08/16   Gouru, Deanna Artis, MD  nitroGLYCERIN (NITROSTAT) 0.4 MG SL tablet Place 0.4 mg under the tongue every 5 (five) minutes as needed for chest pain.    [provider]  Nutritional Supplements (FEEDING SUPPLEMENT, NEPRO CARB STEADY,) LIQD Take 237 mLs by mouth 2 (two) times daily between meals. 07/08/16   Gouru, Deanna Artis, MD  ondansetron (ZOFRAN) 4 MG tablet Take 1 tablet (4 mg total) by mouth every 6 (six) hours as needed for nausea. 07/08/16   Gouru, Deanna Artis, MD  polyethylene glycol (MIRALAX / GLYCOLAX)  packet Take 17 g by mouth daily as needed for moderate constipation. 07/08/16   Gouru, Deanna Artis, MD  senna-docusate (SENOKOT-S) 8.6-50 MG tablet Take 1 tablet by mouth at bedtime as needed for mild constipation. 07/08/16   Ramonita Lab, MD   No Known Allergies  FAMILY HISTORY:  family history includes Cancer in his mother; Diabetes in his father and mother. SOCIAL HISTORY:  reports that he quit smoking about 18 months ago. He has a 22.50 pack-year smoking history. He has never used smokeless tobacco. He reports that he does not drink alcohol or use drugs.  REVIEW OF SYSTEMS:   Constitutional: Negative for fever, chills, weight loss, malaise/fatigue and diaphoresis.  HENT: Negative for hearing loss, ear pain, nosebleeds, congestion, sore throat, neck pain, tinnitus and ear discharge.   Eyes: Negative for blurred vision, double vision, photophobia, pain, discharge and redness.  Respiratory: Negative for cough, hemoptysis, sputum production, shortness of breath, wheezing and stridor.   Cardiovascular: Negative for chest pain, palpitations, orthopnea, claudication, leg swelling and PND.  Gastrointestinal: Negative for heartburn, nausea, vomiting, abdominal pain, diarrhea, constipation, blood in stool and melena.  Genitourinary: Negative for dysuria, urgency, frequency, hematuria and flank pain.  Musculoskeletal: Negative for myalgias, back pain, joint pain and  falls.  Skin: Negative for itching and rash.  Neurological: Negative for dizziness, tingling, tremors, sensory change, speech change, focal weakness, seizures, loss of consciousness, weakness and headaches.  Endo/Heme/Allergies: Negative for environmental allergies and polydipsia. Does not bruise/bleed easily.  SUBJECTIVE: Patient is confused , normotensive currently.  VITAL SIGNS: Temp:  [97.5 F (36.4 C)-100.3 F (37.9 C)] 97.5 F (36.4 C) (05/29 1800) Pulse Rate:  [59-78] 59 (05/29 1800) Resp:  [10-16] 12 (05/29 1800) BP:  (85-112)/(45-69) 106/55 (05/29 1800) SpO2:  [83 %-100 %] 95 % (05/29 1800) Weight:  [171 lb 15.3 oz (78 kg)] 171 lb 15.3 oz (78 kg) (05/29 1026)  PHYSICAL EXAMINATION: General: confused 61 year old male, on 2l of O2, Neuro:  Confused HEENT:  Atraumatic, Normocephalic, PERRLA Cardiovascular:  S1S2, Regular, no m/r/g noted Lungs:  Clear bilaterally, no wheezes, crackles, rhonchi noted Abdomen:  Soft, non tender, +BS Musculoskeletal: weak pedal pulses, no edema, cyanosis noted Skin:  Bilateral surgical wounds, with intact sutures, no dehiscence   Recent Labs Lab 07/23/16 0347 Mar 17, 2016 1029  NA 136 131*  K 4.3 4.7  CL 97* 93*  CO2 30 27  BUN 25* 47*  CREATININE 6.43* 8.60*  GLUCOSE 125* 56*    Recent Labs Lab 07/23/16 0347 07/24/16 0334 Mar 17, 2016 1029 Mar 17, 2016 1552  HGB 9.1* 8.3* 6.7* 7.1*  HCT 27.1* 24.3* 20.1*  --   WBC 4.8 5.7 7.1  --   PLT 236 208 208  --    Ct Head Wo Contrast  Result Date: 2016-09-14 CLINICAL DATA:  Altered mental status.  Unresponsive. EXAM: CT HEAD WITHOUT CONTRAST TECHNIQUE: Contiguous axial images were obtained from the base of the skull through the vertex without intravenous contrast. COMPARISON:  Brain MRI 06/26/2016 FINDINGS: Brain: There is no evidence of acute infarct, intracranial hemorrhage, mass, midline shift, or extra-axial fluid collection. The ventricles and sulci are within normal limits for age. Cerebral white matter hypodensities are unchanged and nonspecific but compatible with mild chronic small vessel ischemic disease. Chronic lacunar infarcts are again seen in the posterior limb of the right internal capsule, bilateral thalami, and left cerebellum. Punctate superficial cortical or extra-axial calcifications are noted in the right parietal and left frontal lobes Vascular: Calcified atherosclerosis at the skullbase. No hyperdense vessel. Skull: No fracture or focal osseous lesion. Sinuses/Orbits: Visualized paranasal sinuses and mastoid  air cells are clear. Orbits are unremarkable. Other: None. IMPRESSION: 1. No evidence of acute intracranial abnormality. 2. Chronic small vessel ischemia with chronic lacunar infarcts as above. Electronically Signed   By: Sebastian AcheAllen  Grady M.D.   On: 02018-07-16 12:45   Dg Chest Port 1 View  Result Date: 2016-09-14 CLINICAL DATA:  Unresponsive.  Hypertension. EXAM: PORTABLE CHEST 1 VIEW COMPARISON:  01/23/2014. FINDINGS: Prior CABG. Cardiomegaly with mild bilateral pulmonary interstitial prominence suggesting mild CHF. No pleural effusion or pneumothorax . IMPRESSION: Prior CABG. Cardiomegaly with mild bilateral from interstitial prominence consistent mild CHF. Electronically Signed   By: Maisie Fushomas  Register   On: 02018-07-16 10:54    ASSESSMENT / PLAN:  Sepsis with unclear etilogy Altered Mental status Fever Anemia ESRD DM PAD- S/P amputation (5/23) Elevated INR, secondary to coumadin.   5/29: MRSA PCR negative 5/29. Blood cultures; negative to date  Plan Continue broad spectrum antibiotics, Follow cultures  Keep MAP goals>65Negative  PCT, Lactic acid is normal Nephrology/podiatry following the patient Received 2 units of PRBC'S(5/29) Continue Epogen with dialysis CT head 5/29 Negative Clear liquid diet advance as tolerated SSI coverage Hold Coumadin  Bincy Varughese,AG-ACNP Pulmonary & Critical Care Pulmonary and Critical Care Medicine [facility]  Patient seen and examined with NP, agree with findings, assessment, plan as amended above. Patient presented from nursing home with altered mental status/diminished responsiveness. This coincided with fevers, the patient was thought to have sepsis. I personally reviewed the patient's chest x-ray images, which is unremarkable. The patient has been started on broad-spectrum antibiotics, continue to follow cultures. CT head unremarkable.  Wells Guiles M.D.  07/29/2016

## 2016-07-28 NOTE — Progress Notes (Signed)
[facility] [place] 07/22/2016  Subjective:   Patient was brought in to ER for altered mental status He is resident of [facility] Family not sure if he went for dialysis on Saturday In ER, patient is notes to be hypotensive and has a fever Nephrology consult to evaluate   Objective:  Vital signs in last 24 hours:  Temp:  [97.8 F (36.6 C)-100.3 F (37.9 C)] 97.8 F (36.6 C) (05/29 1556) Pulse Rate:  [61-78] 61 (05/29 1556) Resp:  [10-16] 10 (05/29 1556) BP: (85-112)/(45-53) 100/50 (05/29 1556) SpO2:  [94 %-100 %] 98 % (05/29 1556) Weight:  [78 kg (171 lb 15.3 oz)] 78 kg (171 lb 15.3 oz) (05/29 1026)  Weight change:  Filed Weights   07/18/2016 1026  Weight: 78 kg (171 lb 15.3 oz)    Intake/Output:    Intake/Output Summary (Last 24 hours) at 07/23/2016 1801 Last data filed at 07/22/2016 1556  Gross per 24 hour  Intake              804 ml  Output                0 ml  Net              804 ml     Physical Exam: General: Ill appearing  HEENT Dry oral mucus membranes  Neck supple  Pulm/lungs Shallow breathing, no crackles  CVS/Heart tachycardic  Abdomen:  Soft, NT  Extremities: Trace edema  Neurologic: Somnolent, did not wake up to voice or stimulation  Skin: No acute rashes  Access LUE AVG       Basic Metabolic Panel:   Recent Labs Lab 07/23/16 0347 07/21/2016 1029  NA 136 131*  K 4.3 4.7  CL 97* 93*  CO2 30 27  GLUCOSE 125* 56*  BUN 25* 47*  CREATININE 6.43* 8.60*  CALCIUM 8.0* 8.1*     CBC:  Recent Labs Lab 07/23/16 0347 07/24/16 0334 07/14/2016 1029 07/18/2016 1552  WBC 4.8 5.7 7.1  --   NEUTROABS  --   --  5.7  --   HGB 9.1* 8.3* 6.7* 7.1*  HCT 27.1* 24.3* 20.1*  --   MCV 89.1 89.0 89.0  --   PLT 236 208 208  --      Lab Results  Component Value Date   HEPBSAG Negative 07/07/2016      Microbiology:  No results found for this or any previous visit (from the past 240 hour(s)).  Coagulation  Studies:  Recent Labs  07/23/2016 1552  LABPROT 29.4*  INR 2.72    Urinalysis: No results for input(s): COLORURINE, LABSPEC, PHURINE, GLUCOSEU, HGBUR, BILIRUBINUR, KETONESUR, PROTEINUR, UROBILINOGEN, NITRITE, LEUKOCYTESUR in the last 72 hours.  Invalid input(s): APPERANCEUR    Imaging: Ct Head Wo Contrast  Result Date: 07/26/2016 CLINICAL DATA:  Altered mental status.  Unresponsive. EXAM: CT HEAD WITHOUT CONTRAST TECHNIQUE: Contiguous axial images were obtained from the base of the skull through the vertex without intravenous contrast. COMPARISON:  Brain MRI 06/26/2016 FINDINGS: Brain: There is no evidence of acute infarct, intracranial hemorrhage, mass, midline shift, or extra-axial fluid collection. The ventricles and sulci are within normal limits for age. Cerebral white matter hypodensities are unchanged and nonspecific but compatible with mild chronic small vessel ischemic disease. Chronic lacunar infarcts are again seen in the posterior limb of the right internal capsule, bilateral thalami, and left cerebellum. Punctate superficial cortical or extra-axial calcifications are noted in the right parietal and  left frontal lobes Vascular: Calcified atherosclerosis at the skullbase. No hyperdense vessel. Skull: No fracture or focal osseous lesion. Sinuses/Orbits: Visualized paranasal sinuses and mastoid air cells are clear. Orbits are unremarkable. Other: None. IMPRESSION: 1. No evidence of acute intracranial abnormality. 2. Chronic small vessel ischemia with chronic lacunar infarcts as above. Electronically Signed   By: Sebastian Ache M.D.   On: Aug 10, 2016 12:45   Dg Chest Port 1 View  Result Date: Aug 10, 2016 CLINICAL DATA:  Unresponsive.  Hypertension. EXAM: PORTABLE CHEST 1 VIEW COMPARISON:  01/23/2014. FINDINGS: Prior CABG. Cardiomegaly with mild bilateral pulmonary interstitial prominence suggesting mild CHF. No pleural effusion or pneumothorax . IMPRESSION: Prior CABG. Cardiomegaly with mild  bilateral from interstitial prominence consistent mild CHF. Electronically Signed   By: Maisie Fus  Register   On: 08/10/16 10:54     Medications:   . sodium chloride    . dextrose 25 mL/hr at 2016/08/10 1503  . piperacillin-tazobactam (ZOSYN)  IV    . [START ON 07/30/2016] vancomycin     . dextrose      . insulin aspart  0-9 Units Subcutaneous Q4H  . sodium chloride flush  3 mL Intravenous Q12H   acetaminophen **OR** acetaminophen, albuterol, ondansetron **OR** ondansetron (ZOFRAN) IV, polyethylene glycol  Assessment/ Plan:  61 y.o.hispanic  male ESRD on HD TTS, coronary artery disease, diabetes mellitus type 2, hyperlipidemia, hypertension, history of CVA, anemia chronic kidney disease, secondary hyperparathyroidism   [facility] TTS [address] Left AVF   1.  ESRD on HD TTS:      Hypotensive; normal potassium level No s/s of volume overload Electrolytes and Volume status are acceptable No acute indication for Dialysis at present Will re-evaluate for HD tomorrow  2.  Anemia chronic kidney disease:  Hemoglobin 6.7  will continue EPO with HD Blood transfusion planned  3.   Fever, hypotension, altered mental status - in work up for sepsis - broad spectrum Abx  4. 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 Alberteen Spindle on 5/23.       LOS: 0 [facility] June 11, 20186:01 PM  [facility] [place], [place] [phone]

## 2016-07-28 NOTE — Progress Notes (Signed)
eLink Physician-Brief Progress Note Patient Name: Vonna DraftsMauro Streed DOB: 30-Sep-1955 MRN: [id number]   Date of Service  07/06/2016  HPI/Events of Note  61 year old male with end-stage renal disease on hemodialysis. Admitted with presumed sepsis. Unclear source. History of groin wound infection from prior intravascular insertion. Reviewed history and physical from admitting physician. No evidence of induration or discharge from insertion site. Lactic acid normal. Patient also anemic and on chronic Coumadin. Transfusion ordered for anemia. Broad-spectrum antibiotics ordered for sepsis as well as cultures for infectious workup. Per documentation case was discussed with Dr. Sung AmabileSimonds. Camera check shows patient resting comfortably with multiple family members at bedside.   eICU Interventions  Continuing current plan of care as per admitting service.      Intervention Category Major Interventions: Sepsis - evaluation and management Evaluation Type: New Patient Evaluation  Lawanda CousinsJennings Hazelle Woollard 07/18/2016, 4:44 PM

## 2016-07-28 NOTE — Progress Notes (Signed)
Pharmacy Antibiotic Note  Eddie Myers is a 61 y.o. male with a h/o ESRD on HD and recent bilateral toe amputations admitted on Apr 15, 2016 with sepsis.  Pharmacy has been consulted for vancomycin and Zosyn dosing.  Plan: Vancomycin 1000 mg iv once followed by 750 mg iv once for a 1750 mg loading dose. Will order vancomycin 750 mg iv qHD and plan on checking a pre-HD level with the third dialysis session. Goal pre-HD vancomycin level= 15-25 mcg/ml.   Zosyn 3.375 g iv once in ED then 3.375 g EI q 12 hours.   Height: 5\' 8"  (172.7 cm) Weight: 171 lb 15.3 oz (78 kg) IBW/kg (Calculated) : 68.4  Temp (24hrs), Avg:98.9 F (37.2 C), Min:98.1 F (36.7 C), Max:100.3 F (37.9 C)   Recent Labs Lab 07/23/16 0347 07/24/16 0334 2016-09-17 1028 2016-09-17 1029  WBC 4.8 5.7  --  7.1  CREATININE 6.43*  --   --  8.60*  LATICACIDVEN  --   --  0.8  --     Estimated Creatinine Clearance: 8.6 mL/min (A) (by C-G formula based on SCr of 8.6 mg/dL (H)).    No Known Allergies  Antimicrobials this admission: vancomycin 5/29 >>  Zosyn 5/29 >>   Dose adjustments this admission:   Microbiology results: 5/29 BCx: sent UCx: sent   Thank you for allowing pharmacy to be a part of this patient's care.  Eddie Myers, Eddie Myers D Apr 15, 2016 3:19 PM

## 2016-07-28 NOTE — H&P (Signed)
[facility] [place] [facility]   PATIENT NAME: Eddie Myers    MR#:  [id number]  DATE OF BIRTH:  13-Feb-1956  DATE OF ADMISSION:  07/21/2016  PRIMARY CARE PHYSICIAN: System, Pcp Not In   REQUESTING/REFERRING PHYSICIAN: Dr. Mayford KnifeWilliams  CHIEF COMPLAINT:   Chief Complaint  Patient presents with  . Altered Mental Status    HISTORY OF PRESENT ILLNESS:  Eddie DraftsMauro Myers  is a 61 y.o. male with a known history of Hypertension, diabetes, incisional disease, anemia of chronic disease, peripheral arterial disease, recent bilateral toe amputations presents to the emergency room from nursing home due to altered mental status and fever 100.3. Patient was also found to be anemic at hemoglobin of 6.7. He is on Coumadin since her stroke last year. He has not missed any hemodialysis. Patient is confused and history has been obtained from reviewing old records, daughter at bedside and ED staff.  Patient was discharged on oral Augmentin on 07/24/2016 to nursing home. PAST MEDICAL HISTORY:   Past Medical History:  Diagnosis Date  . Anginal pain (HCC)   . Coronary artery disease   . Diabetes mellitus without complication (HCC)   . Dialysis patient (HCC)    Tues, Thurs, Sat  . Dyspnea    with exertion  . Elevated lipids   . Enlarged prostate   . ESRD (end stage renal disease) (HCC)    dialysis M-W-F  . Hypertension   . Myocardial infarction (HCC) 08/2015  . Renal insufficiency   . Stroke [facility](HCC)    Lt side this year July    PAST SURGICAL HISTORY:   Past Surgical History:  Procedure Laterality Date  . ABDOMINAL AORTOGRAM W/LOWER EXTREMITY Left 06/26/2016   Procedure: Abdominal Aortogram w/Lower Extremity;  Surgeon: Renford DillsGregory G Schnier, MD;  Location: [facility] INVASIVE CV LAB;  Service: Cardiovascular;  Laterality: Left;  . AMPUTATION TOE Bilateral 07/22/2016   Procedure: AMPUTATION TOE LEFT GREAT TOE AND RIGHT 2ND TOE;  Surgeon: Linus Galasline, Todd, DPM;  Location: [facility] ORS;  Service:  Podiatry;  Laterality: Bilateral;  . AV FISTULA PLACEMENT Left 01/10/2016   Procedure: INSERTION OF ARTERIOVENOUS (AV) GORE-TEX GRAFT ARM ( BRACH / AXILLARY GRAFT );  Surgeon: Renford DillsGregory G Schnier, MD;  Location: [facility] ORS;  Service: Vascular;  Laterality: Left;  . CARDIAC CATHETERIZATION    . CORONARY ARTERY BYPASS GRAFT  01/2014   [facility] (LIMA -> LAD and SVG -> OM)  . INSERTION OF DIALYSIS CATHETER Right    Perma catheter  . LOWER EXTREMITY ANGIOGRAPHY  07/03/2016   Procedure: Lower Extremity Angiography;  Surgeon: Renford DillsSchnier, Gregory G, MD;  Location: [facility] INVASIVE CV LAB;  Service: Cardiovascular;;  . LOWER EXTREMITY INTERVENTION  07/03/2016   Procedure: Lower Extremity Intervention;  Surgeon: Renford DillsSchnier, Gregory G, MD;  Location: [facility] INVASIVE CV LAB;  Service: Cardiovascular;;  . PERIPHERAL VASCULAR BALLOON ANGIOPLASTY Left 07/01/2016   Procedure: Peripheral Vascular Balloon Angioplasty;  Surgeon: Renford DillsSchnier, Gregory G, MD;  Location: [facility] INVASIVE CV LAB;  Service: Cardiovascular;  Laterality: Left;  . PERIPHERAL VASCULAR BALLOON ANGIOPLASTY N/A 07/03/2016   Procedure: Peripheral Vascular Balloon Angioplasty;  Surgeon: Renford DillsSchnier, Gregory G, MD;  Location: [facility] INVASIVE CV LAB;  Service: Cardiovascular;  Laterality: N/A;  . PERIPHERAL VASCULAR CATHETERIZATION N/A 01/17/2016   Procedure: Thrombectomy;  Surgeon: Renford DillsGregory G Schnier, MD;  Location: [facility] INVASIVE CV LAB;  Service: Cardiovascular;  Laterality: N/A;  . PERIPHERAL VASCULAR CATHETERIZATION Left 01/28/2016   Procedure: A/V Fistulagram;  Surgeon: Renford DillsGregory G Schnier, MD;  [facility]  INVASIVE CV LAB;  Service: Cardiovascular;  Laterality: Left;  . PERIPHERAL VASCULAR CATHETERIZATION N/A 03/24/2016   Procedure: Dialysis/Perma Catheter Removal;  Surgeon: Renford Dills, MD;  Location: [facility] INVASIVE CV LAB;  Service: Cardiovascular;  Laterality: N/A;  . UPPER EXTREMITY ANGIOGRAM Left 01/28/2016   Procedure: Upper Extremity Angiogram;  Surgeon:  Renford Dills, MD;  Location: [facility] INVASIVE CV LAB;  Service: Cardiovascular;  Laterality: Left;    SOCIAL HISTORY:   Social History  Substance Use Topics  . Smoking status: Former Smoker    Packs/day: 0.50    Years: 45.00    Quit date: 01/05/2015  . Smokeless tobacco: Never Used  . Alcohol use No    FAMILY HISTORY:   Family History  Problem Relation Age of Onset  . Cancer Mother   . Diabetes Mother   . Diabetes Father     DRUG ALLERGIES:  No Known Allergies  REVIEW OF SYSTEMS:   Review of Systems  Unable to perform ROS: Mental status change    MEDICATIONS AT HOME:   Prior to Admission medications   Medication Sig Start Date End Date Taking? Authorizing Provider  amLODipine (NORVASC) 2.5 MG tablet Take 2.5 mg by mouth daily.   Yes [provider]  amoxicillin-clavulanate (AUGMENTIN) 500-125 MG tablet Take 1 tablet (500 mg total) by mouth every 12 (twelve) hours. 07/24/16 07/30/16 Yes Auburn Bilberry, MD  aspirin EC 81 MG tablet Take 1 tablet (81 mg total) by mouth daily. 07/08/16  Yes Gouru, Deanna Artis, MD  atorvastatin (LIPITOR) 80 MG tablet Take 80 mg by mouth every evening.  02/07/16  Yes [provider]  carvedilol (COREG) 25 MG tablet Take 25 mg by mouth 2 (two) times daily with a meal.   Yes [provider]  docusate sodium (COLACE) 100 MG capsule Take 2 capsules (200 mg total) by mouth 2 (two) times daily. 07/24/16  Yes Auburn Bilberry, MD  glipiZIDE (GLUCOTROL) 5 MG tablet Take 5 mg by mouth 2 (two) times daily.    Yes [provider]  insulin glargine (LANTUS) 100 UNIT/ML injection Inject 21 Units into the skin at bedtime.   Yes [provider]  isosorbide mononitrate (IMDUR) 30 MG 24 hr tablet Take 30 mg by mouth daily.   Yes [provider]  megestrol (MEGACE) 20 MG tablet Take 20 mg by mouth daily.   Yes [provider]  Melatonin 3 MG TABS Take 3 mg by mouth at bedtime.    Yes [provider]   oxyCODONE (OXYCONTIN) 15 mg 12 hr tablet Take 1 tablet (15 mg total) by mouth every 12 (twelve) hours. 07/24/16  Yes Auburn Bilberry, MD  oxyCODONE 20 MG TABS Take 1 tablet (20 mg total) by mouth every 4 (four) hours as needed for moderate pain or breakthrough pain (mild pain). 07/24/16  Yes Auburn Bilberry, MD  pregabalin (LYRICA) 75 MG capsule Take 1 capsule (75 mg total) by mouth 2 (two) times daily. 07/24/16  Yes Auburn Bilberry, MD  sevelamer carbonate (RENVELA) 800 MG tablet Take 1,600 mg by mouth 3 (three) times daily with meals.   Yes [provider]  tamsulosin (FLOMAX) 0.4 MG CAPS capsule Take 0.4 mg by mouth at bedtime.   Yes [provider]  traZODone (DESYREL) 50 MG tablet Take 0.5 tablets (25 mg total) by mouth at bedtime as needed for sleep. 07/24/16  Yes Auburn Bilberry, MD  warfarin (COUMADIN) 5 MG tablet Take 1 tablet (5 mg total) by mouth daily at  6 PM. 5 mg once daily for 2 days. Repeat PT/INR on May 10 further management of Coumadin by the nursing home physician 07/08/16  Yes Gouru, Deanna Artis, MD  acetaminophen (TYLENOL) 325 MG tablet Take 2 tablets (650 mg total) by mouth every 6 (six) hours as needed for mild pain (or Fever >/= 101). 07/08/16   Gouru, Deanna Artis, MD  insulin aspart (NOVOLOG) 100 UNIT/ML injection Inject 0-5 Units into the skin at bedtime. ssi insulin 0-150 no insulin 151-200 2 units 201-250 3 units 251-300 4 units 301-350 5 untis 351-400 6 units If greater then 401 call md 07/24/16   Auburn Bilberry, MD  ipratropium-albuterol (DUONEB) 0.5-2.5 (3) MG/3ML SOLN Take 3 mLs by nebulization every 6 (six) hours as needed. 07/08/16   Gouru, Deanna Artis, MD  nitroGLYCERIN (NITROSTAT) 0.4 MG SL tablet Place 0.4 mg under the tongue every 5 (five) minutes as needed for chest pain.    [provider]  Nutritional Supplements (FEEDING SUPPLEMENT, NEPRO CARB STEADY,) LIQD Take 237 mLs by mouth 2 (two) times daily between meals. 07/08/16   Gouru, Deanna Artis, MD  ondansetron  (ZOFRAN) 4 MG tablet Take 1 tablet (4 mg total) by mouth every 6 (six) hours as needed for nausea. 07/08/16   Gouru, Deanna Artis, MD  polyethylene glycol (MIRALAX / GLYCOLAX) packet Take 17 g by mouth daily as needed for moderate constipation. 07/08/16   Gouru, Deanna Artis, MD  senna-docusate (SENOKOT-S) 8.6-50 MG tablet Take 1 tablet by mouth at bedtime as needed for mild constipation. 07/08/16   Ramonita Lab, MD     VITAL SIGNS:  Blood pressure (!) 92/48, pulse 64, temperature 98.1 F (36.7 C), temperature source Axillary, resp. rate 13, height 5\' 8"  (1.727 m), weight 78 kg (171 lb 15.3 oz), SpO2 97 %.  PHYSICAL EXAMINATION:  Physical Exam  GENERAL:  61 y.o.-year-old patient lying in the bed. Drowsy. Opens his eyes on calling his name. EYES: Pupils equal, round, reactive to light and accommodation. No scleral icterus. Extraocular muscles intact.  HEENT: Head atraumatic, normocephalic. Oropharynx and nasopharynx clear. No oropharyngeal erythema, moist oral mucosa  NECK:  Supple, no jugular venous distention. No thyroid enlargement, no tenderness.  LUNGS: Normal breath sounds bilaterally, no wheezing, rales, rhonchi. No use of accessory muscles of respiration.  CARDIOVASCULAR: S1, S2 normal. No murmurs, rubs, or gallops.  ABDOMEN: Soft, nontender, nondistended. Bowel sounds present. No organomegaly or mass.  EXTREMITIES: No pedal edema, cyanosis, or clubbing. + 2 pedal & radial pulses b/l.   NEUROLOGIC: Cranial nerves II through XII are intact. No focal Motor or sensory deficits appreciated b/l PSYCHIATRIC: The patient is alert and oriented x 3. Good affect.  SKIN: Bilateral total surgical wound with sutures in place. Right groin angiogram wound seems to be healing well. Induration around the scar. No fluctuance.  LABORATORY PANEL:   CBC  Recent Labs Lab 07/29/2016 1029  WBC 7.1  HGB 6.7*  HCT 20.1*  PLT 208    ------------------------------------------------------------------------------------------------------------------  Chemistries   Recent Labs Lab 07/01/2016 1029  NA 131*  K 4.7  CL 93*  CO2 27  GLUCOSE 56*  BUN 47*  CREATININE 8.60*  CALCIUM 8.1*  AST 32  ALT 21  ALKPHOS 262*  BILITOT 0.7   ------------------------------------------------------------------------------------------------------------------  Cardiac Enzymes  Recent Labs Lab 07/13/2016 1029  TROPONINI 0.51*   ------------------------------------------------------------------------------------------------------------------  RADIOLOGY:  Ct Head Wo Contrast  Result Date: 07/22/2016 CLINICAL DATA:  Altered mental status.  Unresponsive. EXAM: CT HEAD WITHOUT CONTRAST TECHNIQUE: Contiguous axial images were obtained from  the base of the skull through the vertex without intravenous contrast. COMPARISON:  Brain MRI 06/26/2016 FINDINGS: Brain: There is no evidence of acute infarct, intracranial hemorrhage, mass, midline shift, or extra-axial fluid collection. The ventricles and sulci are within normal limits for age. Cerebral white matter hypodensities are unchanged and nonspecific but compatible with mild chronic small vessel ischemic disease. Chronic lacunar infarcts are again seen in the posterior limb of the right internal capsule, bilateral thalami, and left cerebellum. Punctate superficial cortical or extra-axial calcifications are noted in the right parietal and left frontal lobes Vascular: Calcified atherosclerosis at the skullbase. No hyperdense vessel. Skull: No fracture or focal osseous lesion. Sinuses/Orbits: Visualized paranasal sinuses and mastoid air cells are clear. Orbits are unremarkable. Other: None. IMPRESSION: 1. No evidence of acute intracranial abnormality. 2. Chronic small vessel ischemia with chronic lacunar infarcts as above. Electronically Signed   By: Sebastian Ache M.D.   On: August 08, 2016 12:45   Dg  Chest Port 1 View  Result Date: 2016/08/08 CLINICAL DATA:  Unresponsive.  Hypertension. EXAM: PORTABLE CHEST 1 VIEW COMPARISON:  01/23/2014. FINDINGS: Prior CABG. Cardiomegaly with mild bilateral pulmonary interstitial prominence suggesting mild CHF. No pleural effusion or pneumothorax . IMPRESSION: Prior CABG. Cardiomegaly with mild bilateral from interstitial prominence consistent mild CHF. Electronically Signed   By: Maisie Fus  Register   On: 08-08-2016 10:54     IMPRESSION AND PLAN:   * Sepsis Likely source could be bilateral toe surgical wounds. Bacteremia? Start Vabcomycin and zosyn to cover MRSA and Anerobes. Admit to stepdown unit. Discussed with Dr. Sung Amabile. Check B cx. Recent right groin infection around the angiogram site. No discharge. No redness.  * Acute encephalopathy likely due to sepsis He did have hypoglycemia but no change even after D50.  * Acutely worsening anemia over anemia of chronic disease. Check stool for Hemoccult. Transfuse 1 unit packed RBC. Repeat hemoglobin.  * Diabetes mellitus with hypoglycemia. Likely hypoglycemic due to sepsis. Start D10 at 25 ML per hour. Accu-Cheks.  * End-stage renal disease on hemodialysis. Discussed with Dr. Thedore Mins of nephrology. Hemodialysis once blood pressure stabilizes.  * Acute on chronic diastolic CHF. Chest x-ray shows mild pulmonary edema. Will need hemodialysis  * elevated troponin likely due to demand ischemia from sepsis in setting of end-stage renal disease. Repeat troponin. Check echocardiogram. Consult cardiology if there is significant worsening of troponin.  * DVT prophylaxis. On Coumadin. Check INR.  All the records are reviewed and case discussed with ED provider. Management plans discussed with the patient, family and they are in agreement.  CODE STATUS: FULL CODE  TOTAL CC TIME TAKING CARE OF THIS PATIENT: 40 minutes.   Milagros Loll R M.D on 08/08/2016 at 1:46 PM  Between 7am to 6pm - Pager -  [phone]  After 6pm go to [email or web address] - password [email or web address]  [facility] [place] [facility]  Office  [phone]  CC: Primary care physician; System, Pcp Not In  Note: This dictation was prepared with Dragon dictation along with smaller phrase technology. Any transcriptional errors that result from this process are unintentional.

## 2016-07-28 NOTE — ED Notes (Addendum)
Two attempts to in and out cath unsuccessful. Unable to advance catheter. MD made aware. Patient is a dialysis patient and unsure if he makes urine. Antibiotics started prior to urine collections per MD.

## 2016-07-28 NOTE — Consult Note (Signed)
Pt in ICU.  Request to evaluate amputation sites. Underwent left great toe and right 2nd toe amputation last week.  Both wounds are healing nicely without signs of infection. No dehiscence.  Keep dry dressing intact to amputation sites.

## 2016-07-29 ENCOUNTER — Inpatient Hospital Stay (HOSPITAL_COMMUNITY)
Admit: 2016-07-29 | Discharge: 2016-07-29 | Disposition: A | Discharge status: Home / Self Care | Payer: Medicare Other | Attending: Internal Medicine | Admitting: Internal Medicine

## 2016-07-29 DIAGNOSIS — I360 Nonrheumatic tricuspid (valve) stenosis: Secondary | ICD-10-CM

## 2016-07-29 LAB — HEMOGLOBIN A1C
Hgb A1c MFr Bld: 6 % — ABNORMAL HIGH (ref 4.8–5.6)
Mean Plasma Glucose: 126 mg/dL

## 2016-07-29 LAB — GLUCOSE, CAPILLARY
GLUCOSE-CAPILLARY: 62 mg/dL — AB (ref 65–99)
GLUCOSE-CAPILLARY: 101 mg/dL — AB (ref 65–99)
GLUCOSE-CAPILLARY: 128 mg/dL — AB (ref 65–99)
GLUCOSE-CAPILLARY: 169 mg/dL — AB (ref 65–99)
Glucose-Capillary: 106 mg/dL — ABNORMAL HIGH (ref 65–99)
Glucose-Capillary: 82 mg/dL (ref 65–99)
Glucose-Capillary: 95 mg/dL (ref 65–99)

## 2016-07-29 LAB — CBC
HCT: 20.3 % — ABNORMAL LOW (ref 40.0–52.0)
HEMOGLOBIN: 6.8 g/dL — AB (ref 13.0–18.0)
MCH: 29.2 pg (ref 26.0–34.0)
MCHC: 33.3 g/dL (ref 32.0–36.0)
MCV: 87.6 fL (ref 80.0–100.0)
Platelets: 203 10*3/uL (ref 150–440)
RBC: 2.31 MIL/uL — AB (ref 4.40–5.90)
RDW: 18.2 % — ABNORMAL HIGH (ref 11.5–14.5)
WBC: 6 10*3/uL (ref 3.8–10.6)

## 2016-07-29 LAB — COMPREHENSIVE METABOLIC PANEL
ALT: 18 U/L (ref 17–63)
ANION GAP: 13 (ref 5–15)
AST: 28 U/L (ref 15–41)
Albumin: 2.2 g/dL — ABNORMAL LOW (ref 3.5–5.0)
Alkaline Phosphatase: 230 U/L — ABNORMAL HIGH (ref 38–126)
BUN: 53 mg/dL — ABNORMAL HIGH (ref 6–20)
CALCIUM: 8 mg/dL — AB (ref 8.9–10.3)
CHLORIDE: 91 mmol/L — AB (ref 101–111)
CO2: 26 mmol/L (ref 22–32)
Creatinine, Ser: 9.25 mg/dL — ABNORMAL HIGH (ref 0.61–1.24)
GFR calc non Af Amer: 5 mL/min — ABNORMAL LOW (ref >60)
GFR, EST AFRICAN AMERICAN: 6 mL/min — AB (ref >60)
Glucose, Bld: 88 mg/dL (ref 65–99)
Potassium: 4.6 mmol/L (ref 3.5–5.1)
SODIUM: 130 mmol/L — AB (ref 135–145)
Total Bilirubin: 1 mg/dL (ref 0.3–1.2)
Total Protein: 6.5 g/dL (ref 6.5–8.1)

## 2016-07-29 LAB — PROCALCITONIN: PROCALCITONIN: 0.6 ng/mL

## 2016-07-29 LAB — PROTIME-INR
INR: 3.18
PROTHROMBIN TIME: 33.3 s — AB (ref 11.4–15.2)

## 2016-07-29 LAB — PREPARE RBC (CROSSMATCH)

## 2016-07-29 LAB — ECHOCARDIOGRAM COMPLETE
HEIGHTINCHES: 68 in
WEIGHTICAEL: 2790.14 [oz_av]

## 2016-07-29 MED ORDER — AMLODIPINE BESYLATE 5 MG PO TABS: 2.5000 mg | ORAL_TABLET | Freq: Every day | ORAL | Status: DC

## 2016-07-29 MED ORDER — CARVEDILOL 12.5 MG PO TABS: 12.5000 mg | ORAL_TABLET | Freq: Two times a day (BID) | ORAL | Status: DC

## 2016-07-29 MED ORDER — DEXTROSE 50 % IV SOLN
25.0000 mL | Freq: Once | INTRAVENOUS | Status: AC
  Administered 2016-07-29: 25 mL via INTRAVENOUS

## 2016-07-29 MED ORDER — SODIUM CHLORIDE 0.9 % IV SOLN: Freq: Once | INTRAVENOUS | Status: DC

## 2016-07-29 MED ORDER — DEXTROSE 50 % IV SOLN
INTRAVENOUS | Status: AC
  Filled 2016-07-29: qty 50

## 2016-07-29 NOTE — Progress Notes (Signed)
Post hd assessment 

## 2016-07-29 NOTE — Progress Notes (Signed)
8pm assessment completed with spanish interpreter Irwing Madrid.

## 2016-07-29 NOTE — Progress Notes (Signed)
[facility], [place] 07/29/16  Subjective:   Patient is more alert today Nursing staff does not report any acute events No shortness of breath Not on any pressors.   Objective:  Vital signs in last 24 hours:  Temp:  [97.5 F (36.4 C)-98.2 F (36.8 C)] 97.7 F (36.5 C) (05/30 1000) Pulse Rate:  [55-141] 61 (05/30 1300) Resp:  [0-21] 12 (05/30 1300) BP: (77-163)/(49-116) 102/57 (05/30 1300) SpO2:  [63 %-100 %] 78 % (05/30 1300) Weight:  [79.1 kg (174 lb 6.1 oz)] 79.1 kg (174 lb 6.1 oz) (05/30 0500)  Weight change:  Filed Weights   08/15/2016 1026 07/29/16 0500  Weight: 78 kg (171 lb 15.3 oz) 79.1 kg (174 lb 6.1 oz)    Intake/Output:    Intake/Output Summary (Last 24 hours) at 07/29/16 1544 Last data filed at 07/29/16 0954  Gross per 24 hour  Intake          1307.33 ml  Output                0 ml  Net          1307.33 ml     Physical Exam: General: Chronically Ill appearing  HEENT Dry oral mucus membranes  Neck supple  Pulm/lungs Shallow breathing, no crackles  CVS/Heart tachycardic  Abdomen:  Soft, NT  Extremities: Trace edema  Neurologic: Motor low today, able to follow simple commands  Skin: No acute rashes  Access LUE AVG       Basic Metabolic Panel:   Recent Labs Lab 07/23/16 0347 Aug 15, 2016 1029 07/29/16 0351  NA 136 131* 130*  K 4.3 4.7 4.6  CL 97* 93* 91*  CO2 30 27 26   GLUCOSE 125* 56* 88  BUN 25* 47* 53*  CREATININE 6.43* 8.60* 9.25*  CALCIUM 8.0* 8.1* 8.0*     CBC:  Recent Labs Lab 07/23/16 0347 07/24/16 0334 08-15-16 1029 08-15-2016 1552 07/29/16 0351  WBC 4.8 5.7 7.1  --  6.0  NEUTROABS  --   --  5.7  --   --   HGB 9.1* 8.3* 6.7* 7.1* 6.8*  HCT 27.1* 24.3* 20.1*  --  20.3*  MCV 89.1 89.0 89.0  --  87.6  PLT 236 208 208  --  203      Lab Results  Component Value Date   HEPBSAG Negative 07/07/2016      Microbiology:  Recent Results (from the past 240 hour(s))  Blood Culture (routine x  2)     Status: None (Preliminary result)   Collection Time: 2016/08/15 10:30 AM  Result Value Ref Range Status   Specimen Description BLOOD  RIGHT HAND  Final   Special Requests BOTTLES DRAWN AEROBIC AND ANAEROBIC  BCAV  Final   Culture NO GROWTH < 24 HOURS  Final   Report Status PENDING  Incomplete  Blood Culture (routine x 2)     Status: None (Preliminary result)   Collection Time: 08/15/2016 10:30 AM  Result Value Ref Range Status   Specimen Description BLOOD  RIGHT FOREARM  Final   Special Requests   Final    BOTTLES DRAWN AEROBIC AND ANAEROBIC Blood Culture results may not be optimal due to an excessive volume of blood received in culture bottles   Culture NO GROWTH < 24 HOURS  Final   Report Status PENDING  Incomplete  MRSA PCR Screening     Status: None   Collection Time: 08/15/2016  2:15 PM  Result Value Ref Range Status  MRSA by PCR NEGATIVE NEGATIVE Final    Comment:        The GeneXpert MRSA Assay (FDA approved for NASAL specimens only), is one component of a comprehensive MRSA colonization surveillance program. It is not intended to diagnose MRSA infection nor to guide or monitor treatment for MRSA infections.     Coagulation Studies:  Recent Labs  07/26/2016 1552 07/29/16 0351  LABPROT 29.4* 33.3*  INR 2.72 3.18    Urinalysis: No results for input(s): COLORURINE, LABSPEC, PHURINE, GLUCOSEU, HGBUR, BILIRUBINUR, KETONESUR, PROTEINUR, UROBILINOGEN, NITRITE, LEUKOCYTESUR in the last 72 hours.  Invalid input(s): APPERANCEUR    Imaging: Ct Head Wo Contrast  Result Date: 07/29/2016 CLINICAL DATA:  Altered mental status.  Unresponsive. EXAM: CT HEAD WITHOUT CONTRAST TECHNIQUE: Contiguous axial images were obtained from the base of the skull through the vertex without intravenous contrast. COMPARISON:  Brain MRI 06/26/2016 FINDINGS: Brain: There is no evidence of acute infarct, intracranial hemorrhage, mass, midline shift, or extra-axial fluid collection. The  ventricles and sulci are within normal limits for age. Cerebral white matter hypodensities are unchanged and nonspecific but compatible with mild chronic small vessel ischemic disease. Chronic lacunar infarcts are again seen in the posterior limb of the right internal capsule, bilateral thalami, and left cerebellum. Punctate superficial cortical or extra-axial calcifications are noted in the right parietal and left frontal lobes Vascular: Calcified atherosclerosis at the skullbase. No hyperdense vessel. Skull: No fracture or focal osseous lesion. Sinuses/Orbits: Visualized paranasal sinuses and mastoid air cells are clear. Orbits are unremarkable. Other: None. IMPRESSION: 1. No evidence of acute intracranial abnormality. 2. Chronic small vessel ischemia with chronic lacunar infarcts as above. Electronically Signed   By: Sebastian AcheAllen  Grady M.D.   On: 07/17/2016 12:45   Dg Chest Port 1 View  Result Date: 07/18/2016 CLINICAL DATA:  Unresponsive.  Hypertension. EXAM: PORTABLE CHEST 1 VIEW COMPARISON:  01/23/2014. FINDINGS: Prior CABG. Cardiomegaly with mild bilateral pulmonary interstitial prominence suggesting mild CHF. No pleural effusion or pneumothorax . IMPRESSION: Prior CABG. Cardiomegaly with mild bilateral from interstitial prominence consistent mild CHF. Electronically Signed   By: Maisie Fushomas  Register   On: 07/13/2016 10:54     Medications:   . sodium chloride Stopped (06/30/2016 2316)  . sodium chloride    . dextrose 30 mL/hr at 07/29/16 0400  . piperacillin-tazobactam (ZOSYN)  IV Stopped (07/29/16 1337)  . [START ON 07/30/2016] vancomycin     . insulin aspart  0-9 Units Subcutaneous Q4H  . sodium chloride flush  3 mL Intravenous Q12H   acetaminophen **OR** acetaminophen, albuterol, ondansetron **OR** ondansetron (ZOFRAN) IV, polyethylene glycol  Assessment/ Plan:  61 y.o.hispanic  male ESRD on HD TTS, coronary artery disease, diabetes mellitus type 2, hyperlipidemia, hypertension, history of CVA,  anemia chronic kidney disease, secondary hyperparathyroidism   [facility] TTS [address] Left AVF   1.  ESRD on HD TTS:      hemodialysis plan for today No volume removal  2.  Anemia chronic kidney disease:  Hemoglobin 6.8  will continue EPO with HD  3.   Fever, hypotension, altered mental status - in work up for sepsis - broad spectrum Abx  4. 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 Alberteen Spindleline on 5/23.  5.  Congestive heart failure, ch systolc Echo EF 30-35%.noted on 07/29/16       LOS: 1 [facility],Lynleigh Kovack 5/30/20183:44 PM  [facility] [address] [facility] [place], [place] [phone]

## 2016-07-29 NOTE — Progress Notes (Signed)
Pre hd assessment  

## 2016-07-29 NOTE — Progress Notes (Signed)
[facility]icians - [place] at [facility]   PATIENT NAME: Eddie Myers    MR#:  [id number]  DATE OF BIRTH:  16-Feb-1956  SUBJECTIVE:  CHIEF COMPLAINT:   Chief Complaint  Patient presents with  . 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 Alberteen Spindle 07/22/2016, hypertension, diabetes, coronary artery disease, end-stage renal disease, on dialysis Tuesday, Thursday, Saturdays, peripheral artery disease, who presents to the hospital with altered mental status and fever to 100.3. He was also noted to be anemic with hemoglobin level of 6.7. Chest x-ray revealed mild cardiomegaly and CHF. Patient remains on room air. He remains confused, per nursing staff.  Review of Systems  Unable to perform ROS: Mental status change    VITAL SIGNS: Blood pressure (!) 102/57, pulse 61, temperature 97.7 F (36.5 C), resp. rate 12, height 5\' 8"  (1.727 m), weight 79.1 kg (174 lb 6.1 oz), SpO2 (!) 78 %.  PHYSICAL EXAMINATION:   GENERAL:  61 y.o.-year-old patient lying in the bed with no acute distress.  EYES: Pupils equal, round, reactive to light and accommodation. No scleral icterus. Extraocular muscles intact.  HEENT: Head atraumatic, normocephalic. Oropharynx and nasopharynx clear.  NECK:  Supple, no jugular venous distention. No thyroid enlargement, no tenderness.  LUNGS: Normal breath sounds bilaterally, no wheezing, rales,rhonchi or crepitation. No use of accessory muscles of respiration.  CARDIOVASCULAR: S1, S2 normal. No murmurs, rubs, or gallops.  ABDOMEN: Soft, nontender, nondistended. Bowel sounds present. No organomegaly or mass.  EXTREMITIES: Trace lower tibial edema, Not able to see if patient had pedal edema, cyanosis, or clubbing due to Bilateral feet dressings NEUROLOGIC: Cranial nerves II through XII are intact. Muscle strength 5/5 in all  extremities. Sensation intact. Gait not checked.  PSYCHIATRIC: The patient is alert and oriented x 3.  SKIN: No obvious rash, lesion, or ulcer.   ORDERS/RESULTS REVIEWED:   CBC  Recent Labs Lab 07/23/16 0347 07/24/16 0334 21-Aug-2016 1029 2016-08-21 1552 07/29/16 0351  WBC 4.8 5.7 7.1  --  6.0  HGB 9.1* 8.3* 6.7* 7.1* 6.8*  HCT 27.1* 24.3* 20.1*  --  20.3*  PLT 236 208 208  --  203  MCV 89.1 89.0 89.0  --  87.6  MCH 29.9 30.3 29.9  --  29.2  MCHC 33.5 34.1 33.5  --  33.3  RDW 17.4* 17.3* 17.3*  --  18.2*  LYMPHSABS  --   --  0.6*  --   --   MONOABS  --   --  0.5  --   --   EOSABS  --   --  0.2  --   --   BASOSABS  --   --  0.0  --   --    ------------------------------------------------------------------------------------------------------------------  Chemistries   Recent Labs Lab 07/23/16 0347 2016/08/21 1029 07/29/16 0351  NA 136 131* 130*  K 4.3 4.7 4.6  CL 97* 93* 91*  CO2 30 27 26   GLUCOSE 125* 56* 88  BUN 25* 47* 53*  CREATININE 6.43* 8.60* 9.25*  CALCIUM 8.0* 8.1* 8.0*  AST  --  32 28  ALT  --  21 18  ALKPHOS  --  262* 230*  BILITOT  --  0.7 1.0   ------------------------------------------------------------------------------------------------------------------ estimated creatinine clearance is 8 mL/min (A) (by C-G formula based on SCr of 9.25 mg/dL (H)). ------------------------------------------------------------------------------------------------------------------ No results for input(s): TSH, T4TOTAL, T3FREE,  THYROIDAB in the last 72 hours.  Invalid input(s): FREET3  Cardiac Enzymes  Recent Labs Lab 07/17/2016 1029 07/20/2016 1552 07/06/2016 2146  TROPONINI 0.51* 0.53* 0.45*   ------------------------------------------------------------------------------------------------------------------ Invalid input(s): POCBNP ---------------------------------------------------------------------------------------------------------------  RADIOLOGY: Ct Head Wo  Contrast  Result Date: 07/24/2016 CLINICAL DATA:  Altered mental status.  Unresponsive. EXAM: CT HEAD WITHOUT CONTRAST TECHNIQUE: Contiguous axial images were obtained from the base of the skull through the vertex without intravenous contrast. COMPARISON:  Brain MRI 06/26/2016 FINDINGS: Brain: There is no evidence of acute infarct, intracranial hemorrhage, mass, midline shift, or extra-axial fluid collection. The ventricles and sulci are within normal limits for age. Cerebral white matter hypodensities are unchanged and nonspecific but compatible with mild chronic small vessel ischemic disease. Chronic lacunar infarcts are again seen in the posterior limb of the right internal capsule, bilateral thalami, and left cerebellum. Punctate superficial cortical or extra-axial calcifications are noted in the right parietal and left frontal lobes Vascular: Calcified atherosclerosis at the skullbase. No hyperdense vessel. Skull: No fracture or focal osseous lesion. Sinuses/Orbits: Visualized paranasal sinuses and mastoid air cells are clear. Orbits are unremarkable. Other: None. IMPRESSION: 1. No evidence of acute intracranial abnormality. 2. Chronic small vessel ischemia with chronic lacunar infarcts as above. Electronically Signed   By: Sebastian AcheAllen  Grady M.D.   On: 07/19/2016 12:45   Dg Chest Port 1 View  Result Date: 07/11/2016 CLINICAL DATA:  Unresponsive.  Hypertension. EXAM: PORTABLE CHEST 1 VIEW COMPARISON:  01/23/2014. FINDINGS: Prior CABG. Cardiomegaly with mild bilateral pulmonary interstitial prominence suggesting mild CHF. No pleural effusion or pneumothorax . IMPRESSION: Prior CABG. Cardiomegaly with mild bilateral from interstitial prominence consistent mild CHF. Electronically Signed   By: Maisie Fushomas  Register   On: 07/21/2016 10:54    EKG:  Orders placed or performed during the hospital encounter of 07/20/2016  . ED EKG 12-Lead  . ED EKG 12-Lead  . EKG 12-Lead  . EKG 12-Lead    ASSESSMENT AND  PLAN:  Active Problems:   Encephalopathy acute  #1. Acute encephalopathy, likely metabolic due to infection, fever, hypoglycemia, follow clinically, head CT was unremarkable, discussed with Dr. Nicholos Johnsamachandran today #2.  sepsis, hypotension, hypoglycemia, continue broad-spectrum antibiotic therapy, blood cultures are negative so far, MRSA PCR is negative, urine culture is  pending, awaiting for podiatrist input #3 hypotension, suspended blood pressure medications #4, hypoglycemia and diabetic, hemoglobin A1c 6.0, now on dextrose solution, advance diet to diabetic as tolerated #5. End-stage renal disease, dialysis per nephrologist #6. Anemia, etiology is unclear, status post 2 units of packed red blood cell transfusion, follow hemoglobin level closely, Hemoccult, no obvious bleeding, however, patient was on aspirin, Coumadin at home   Management plans discussed with the patient, family and they are in agreement.   DRUG ALLERGIES: No Known Allergies  CODE STATUS:     Code Status Orders        Start     Ordered   07/22/2016 1331  Full code  Continuous     06/30/2016 1331    Code Status History    Date Active Date Inactive Code Status Order ID Comments User Context   07/20/2016 10:44 AM 07/24/2016  9:34 PM Full Code [id number]  Ramonita LabGouru, Aruna, MD Inpatient   06/24/2016  2:41 PM 07/08/2016  6:16 PM Full Code [id number]  [place] SirenSainani, Vivek J, MD Inpatient   01/28/2016 11:52 AM 01/28/2016  4:07 PM Full Code [id number]  Schnier, Latina CraverGregory G, MD Inpatient      TOTAL TIME TAKING CARE OF THIS PATIENT:  35 minutes.    Katharina Caper M.D on 07/29/2016 at 1:44 PM  Between 7am to 6pm - Pager - [phone]  After 6pm go to [email or web address] - password [email or web address]  [facility]  Office  [phone]  CC: Primary care physician; System, Pcp Not In

## 2016-07-29 NOTE — [place] FL2 (Signed)
[place] MEDICAID FL2 LEVEL OF CARE SCREENING TOOL     IDENTIFICATION  Patient Name: Eddie Myers Birthdate: Mar 13, 1955 Sex: male Admission Date (Current Location): 2016-12-13  [place]   Facility and Address:  [facility], [address], [place], [place] [address]      Provider Number: [id number]  Attending Physician Name and Address:  Katharina CaperVaickute, Rima, MD  Relative Name and Phone Number:       Current Level of Care: Hospital Recommended Level of Care: Skilled Nursing Facility Prior Approval Number:    Date Approved/Denied:   PASRR Number: [id number]  Discharge Plan: SNF    Current Diagnoses:New stroke Patient Active Problem List   Diagnosis Date Noted  . Encephalopathy acute 02018-10-14  . Gangrene of foot (HCC) 07/20/2016  . Gangrene (HCC) 06/24/2016  . Vision loss of right eye 06/24/2016  . Gangrene of lower extremity (HCC) 06/24/2016  . Encounter for therapeutic drug monitoring 03/25/2016  . Paroxysmal atrial fibrillation (HCC) 03/07/2016  . Renal dialysis device, implant, or graft complication 01/22/2016  . ESRD on dialysis (HCC) 01/06/2016  . Type 2 diabetes mellitus with complication (HCC) 01/06/2016  . Anginal pain (HCC) 11/20/2015  . Hypertension 11/20/2015  . Coronary artery disease involving native coronary artery of native heart without angina pectoris 11/20/2015  . Cardiomyopathy, ischemic 11/20/2015  . Stroke (HCC) 11/20/2015    Orientation RESPIRATION BLADDER Height & Weight     Self, Time, Situation, Place  Normal, O2 (3 liters) incontinent Weight: 174 lb 6.1 oz (79.1 kg) Height:  5\' 8"  (172.7 cm)  BEHAVIORAL SYMPTOMS/MOOD NEUROLOGICAL BOWEL NUTRITION STATUS   (none)  (none) Incontinent Diet  AMBULATORY STATUS COMMUNICATION OF NEEDS Skin   Total Care Verbally Surgical wounds                       Personal Care Assistance Level of Assistance  Bathing, Dressing Bathing: Total  care   Dressing: Total care     Functional Limitations Info   (none)          SPECIAL CARE FACTORS FREQUENCY  PT (By licensed PT) (dialysis)                    Contractures Contractures Info: Not present    Additional Factors Info  Code Status, Allergies Code Status Info: full Allergies Info: nka           Current Medications (07/29/2016):  This is the current hospital active medication list Current Facility-Administered Medications  Medication Dose Route Frequency Provider Last Rate Last Dose  . 0.9 %  sodium chloride infusion  10 mL/hr Intravenous Once Emily FilbertWilliams, Jonathan E, MD   Stopped at 2016/07/30 2316  . 0.9 %  sodium chloride infusion   Intravenous Once Varughese, Bincy S, NP      . acetaminophen (TYLENOL) tablet 650 mg  650 mg Oral Q6H PRN Milagros LollSudini, Srikar, MD   650 mg at 2016/07/30 2042   Or  . acetaminophen (TYLENOL) suppository 650 mg  650 mg Rectal Q6H PRN Sudini, Wardell HeathSrikar, MD      . albuterol (PROVENTIL) (2.5 MG/3ML) 0.083% nebulizer solution 2.5 mg  2.5 mg Nebulization Q2H PRN Sudini, Srikar, MD      . dextrose 10 % infusion   Intravenous Continuous Varughese, Bincy S, NP 30 mL/hr at 07/29/16 0400    . insulin aspart (novoLOG) injection 0-9 Units  0-9 Units Subcutaneous Q4H Milagros LollSudini, Srikar, MD      .  ondansetron (ZOFRAN) tablet 4 mg  4 mg Oral Q6H PRN Milagros Loll, MD       Or  . ondansetron (ZOFRAN) injection 4 mg  4 mg Intravenous Q6H PRN Sudini, Srikar, MD      . piperacillin-tazobactam (ZOSYN) IVPB 3.375 g  3.375 g Intravenous Q12H Valentina Gu, [facility]   Stopped at 07/29/16 1337  . polyethylene glycol (MIRALAX / GLYCOLAX) packet 17 g  17 g Oral Daily PRN Sudini, Srikar, MD      . sodium chloride flush (NS) 0.9 % injection 3 mL  3 mL Intravenous Q12H Sudini, Srikar, MD   3 mL at 07/29/16 1027  . [START ON 07/30/2016] vancomycin (VANCOCIN) IVPB 750 mg/150 ml premix  750 mg Intravenous Q T,Th,Sa-HD Valentina Gu, [facility]         Discharge  Medications: Please see discharge summary for a list of discharge medications.  Relevant Imaging Results:  Relevant Lab Results:   Additional Information ss: [id number]  York Spaniel, LCSW

## 2016-07-29 NOTE — Progress Notes (Signed)
CBG of 46 obtained at midnight. 25 grams of 50% Dextrose given to patient via IV. After 15 minutes CBG reading now 95. Reported to NP.

## 2016-07-29 NOTE — Progress Notes (Signed)
Noted in neuro assessment that left grip was weaker than right. Also patient was unable to coordinate hands enough to feed himself. Daughter notes this  new for patient. At [facility] patient could feed self and only went to [facility] for rehabilitation.

## 2016-07-29 NOTE — Progress Notes (Signed)
  End of hd 

## 2016-07-29 NOTE — Progress Notes (Signed)
Quiet day. Fed all 3 meals. No acute issues. Still question whether family understands patients diagnosis.

## 2016-07-29 NOTE — Progress Notes (Signed)
Initial Nutrition Assessment  DOCUMENTATION CODES:   Not applicable  INTERVENTION:  1. Nepro Shake po BID, each supplement provides 425 kcal and 19 grams protein w/ advancement  NUTRITION DIAGNOSIS:   Inadequate oral intake related to other (see comment) (altered mental status) as evidenced by other (see comment) (observation).  GOAL:   Patient will meet greater than or equal to 90% of their needs  MONITOR:   PO intake, I & O's, Labs, Weight trends, Diet advancement, Supplement acceptance  REASON FOR ASSESSMENT:   Malnutrition Screening Tool    ASSESSMENT:   61 year old male with ESRD, recent foot surgery. Presents with altered mental status and fever of 100.3. Source of sepsis is unclear.  Mr. Felipa FurnaceGarcia admitted with AMS, unable to provide hx, no family at bedside. Recently seen by RD 3 weeks ago for length of stay. Patient had never received dialysis education and was not eating well at the time. Blood sugar was low overnight. Weight appears to be stable, down 3# from previous admission. No apparent nausea/vomiting Nutrition-Focused physical exam completed. Findings are no fat depletion, no muscle depletion, and no edema.  ESRD on HD - awaiting stabilization of blood pressure. Labs and medications reviewed: CBGs WNL Na 130 D10 @ 1130mL/hr --> 245 calories  Diet Order:  Diet clear liquid Room service appropriate? Yes; Fluid consistency: Thin  Skin:  Reviewed, no issues  Last BM:  07/13/2016  Height:   Ht Readings from Last 1 Encounters:  07/14/2016 5\' 8"  (1.727 m)    Weight:   Wt Readings from Last 1 Encounters:  07/29/16 174 lb 6.1 oz (79.1 kg)    Ideal Body Weight:  70 kg  BMI:  Body mass index is 26.51 kg/m.  Estimated Nutritional Needs:   Kcal:  1950-2350 calories  Protein:  94-110 grams  Fluid:  UOP + 1L  EDUCATION NEEDS:   Education needs no appropriate at this time  Dionne AnoWilliam M. Lavern Maslow, MS, RD LDN Inpatient Clinical Dietitian Pager  [phone]

## 2016-07-29 NOTE — Progress Notes (Signed)
Pharmacy Antibiotic Note  Eddie Myers is a 61 y.o. male with a h/o ESRD on HD and recent bilateral toe amputations admitted on 04/25/16 with sepsis.  Pharmacy has been consulted for vancomycin and Zosyn dosing.  Plan: Will continue vancomycin 750 mg iv qHD and plan on checking a pre-HD level with the third dialysis session. Goal pre-HD vancomycin level= 15-25 mcg/ml.   Zosyn 3.375 g iv once in ED then 3.375 g EI q 12 hours.   Height: 5\' 8"  (172.7 cm) Weight: 174 lb 6.1 oz (79.1 kg) IBW/kg (Calculated) : 68.4  Temp (24hrs), Avg:98.2 F (36.8 C), Min:97.5 F (36.4 C), Max:100.3 F (37.9 C)   Recent Labs Lab 07/23/16 0347 07/24/16 0334 May 18, 2016 1028 May 18, 2016 1029 07/29/16 0351  WBC 4.8 5.7  --  7.1 6.0  CREATININE 6.43*  --   --  8.60* 9.25*  LATICACIDVEN  --   --  0.8  --   --     Estimated Creatinine Clearance: 8 mL/min (A) (by C-G formula based on SCr of 9.25 mg/dL (H)).    No Known Allergies  Antimicrobials this admission: vancomycin 5/29 >>  Zosyn 5/29 >>   Dose adjustments this admission:   Microbiology results: 5/29 BCx: NGTD 5/29 MRSA PCR: negative UCx: sent   Thank you for allowing pharmacy to be a part of this patient's care.  Luisa HartChristy, Sabre Romberger D 07/29/2016 9:26 AM

## 2016-07-29 NOTE — Progress Notes (Signed)
Late note- Patient not given Insulin per sliding scale due to frequent low blood sugar and constant infusion of D10.

## 2016-07-29 NOTE — Consult Note (Signed)
WOC Nurse wound consult note Reason for Consult: Surgical wounds to left great toe and right 2nd toe amputation site.  Will defer to podiatry orders for dry dressing daily.   Wound type:surgical Pressure Injury POA: N/A Will not follow at this time.  Please re-consult if needed.  Maple HudsonKaren Antonieta Slaven RN BSN CWON Pager [phone]

## 2016-07-29 NOTE — Progress Notes (Signed)
*  PRELIMINARY RESULTS* Echocardiogram 2D Echocardiogram has been performed.  Eddie Myers, Eddie Myers 07/29/2016, 9:48 AM

## 2016-07-29 NOTE — Progress Notes (Signed)
Pre hd info 

## 2016-07-29 NOTE — Progress Notes (Signed)
Post hd vitals 

## 2016-07-29 NOTE — Progress Notes (Signed)
Hd start 

## 2016-07-29 NOTE — Progress Notes (Signed)
Patient refused mouth swabs and also sips of water. Denies pain. D10 IVF rate increased to 2330ml/hr.

## 2016-07-30 ENCOUNTER — Inpatient Hospital Stay: Payer: Medicare Other

## 2016-07-30 DIAGNOSIS — N186 End stage renal disease: Secondary | ICD-10-CM | POA: Diagnosis present

## 2016-07-30 LAB — CBC
HEMATOCRIT: 23.7 % — AB (ref 40.0–52.0)
Hemoglobin: 8 g/dL — ABNORMAL LOW (ref 13.0–18.0)
MCH: 28.9 pg (ref 26.0–34.0)
MCHC: 33.8 g/dL (ref 32.0–36.0)
MCV: 85.6 fL (ref 80.0–100.0)
PLATELETS: 210 10*3/uL (ref 150–440)
RBC: 2.77 MIL/uL — ABNORMAL LOW (ref 4.40–5.90)
RDW: 18.1 % — AB (ref 11.5–14.5)
WBC: 5.6 10*3/uL (ref 3.8–10.6)

## 2016-07-30 LAB — BPAM RBC
BLOOD PRODUCT EXPIRATION DATE: 201806032359
Blood Product Expiration Date: 201806012359
Blood Product Expiration Date: 201806012359
ISSUE DATE / TIME: 201805291307
ISSUE DATE / TIME: 201805301921
UNIT TYPE AND RH: 6200
Unit Type and Rh: 5100
Unit Type and Rh: 600

## 2016-07-30 LAB — MAGNESIUM: Magnesium: 1.8 mg/dL (ref 1.7–2.4)

## 2016-07-30 LAB — TYPE AND SCREEN
ABO/RH(D): A POS
Antibody Screen: NEGATIVE
UNIT DIVISION: 0
UNIT DIVISION: 0
Unit division: 0

## 2016-07-30 LAB — GLUCOSE, CAPILLARY
GLUCOSE-CAPILLARY: 86 mg/dL (ref 65–99)
GLUCOSE-CAPILLARY: 152 mg/dL — AB (ref 65–99)
Glucose-Capillary: 101 mg/dL — ABNORMAL HIGH (ref 65–99)
Glucose-Capillary: 108 mg/dL — ABNORMAL HIGH (ref 65–99)
Glucose-Capillary: 126 mg/dL — ABNORMAL HIGH (ref 65–99)
Glucose-Capillary: 89 mg/dL (ref 65–99)

## 2016-07-30 LAB — BASIC METABOLIC PANEL
Anion gap: 10 (ref 5–15)
BUN: 27 mg/dL — AB (ref 6–20)
CALCIUM: 7.9 mg/dL — AB (ref 8.9–10.3)
CO2: 32 mmol/L (ref 22–32)
CREATININE: 5.71 mg/dL — AB (ref 0.61–1.24)
Chloride: 93 mmol/L — ABNORMAL LOW (ref 101–111)
GFR calc Af Amer: 11 mL/min — ABNORMAL LOW (ref >60)
GFR, EST NON AFRICAN AMERICAN: 10 mL/min — AB (ref >60)
GLUCOSE: 110 mg/dL — AB (ref 65–99)
Potassium: 4.4 mmol/L (ref 3.5–5.1)
Sodium: 135 mmol/L (ref 135–145)

## 2016-07-30 LAB — PROTIME-INR
INR: 3.84
Prothrombin Time: 38.7 seconds — ABNORMAL HIGH (ref 11.4–15.2)

## 2016-07-30 LAB — PROCALCITONIN: Procalcitonin: 0.54 ng/mL

## 2016-07-30 LAB — HEMOGLOBIN: Hemoglobin: 8.2 g/dL — ABNORMAL LOW (ref 13.0–18.0)

## 2016-07-30 MED ORDER — IOPAMIDOL (ISOVUE-300) INJECTION 61%: 30.0000 mL | Freq: Once | INTRAVENOUS | Status: DC

## 2016-07-30 MED ORDER — HALOPERIDOL LACTATE 5 MG/ML IJ SOLN
0.5000 mg | Freq: Four times a day (QID) | INTRAMUSCULAR | Status: DC | PRN
  Administered 2016-07-30 (×2): 0.5 mg via INTRAVENOUS
  Filled 2016-07-30: qty 1

## 2016-07-30 MED ORDER — IOPAMIDOL (ISOVUE-300) INJECTION 61%
15.0000 mL | INTRAVENOUS | Status: AC
  Administered 2016-07-30 (×2): 15 mL via ORAL

## 2016-07-30 MED ORDER — SODIUM CHLORIDE 0.9 % IV SOLN: Freq: Once | INTRAVENOUS | Status: DC

## 2016-07-30 MED ORDER — IOPAMIDOL (ISOVUE-300) INJECTION 61%
100.0000 mL | Freq: Once | INTRAVENOUS | Status: AC | PRN
  Administered 2016-07-30: 100 mL via INTRAVENOUS

## 2016-07-30 MED ORDER — POLYETHYLENE GLYCOL 3350 17 G PO PACK
17.0000 g | PACK | Freq: Every day | ORAL | Status: DC
  Administered 2016-07-30: 17 g via ORAL

## 2016-07-30 MED ORDER — VITAMIN K1 10 MG/ML IJ SOLN
5.0000 mg | Freq: Once | INTRAVENOUS | Status: DC
  Filled 2016-07-30: qty 0.5

## 2016-07-30 MED ORDER — HALOPERIDOL LACTATE 5 MG/ML IJ SOLN
INTRAMUSCULAR | Status: AC
  Filled 2016-07-30: qty 1

## 2016-07-30 MED ORDER — ASPIRIN EC 81 MG PO TBEC
81.0000 mg | DELAYED_RELEASE_TABLET | Freq: Every day | ORAL | Status: DC
  Administered 2016-07-30: 81 mg via ORAL

## 2016-07-30 MED ORDER — HALOPERIDOL LACTATE 5 MG/ML IJ SOLN
2.0000 mg | Freq: Four times a day (QID) | INTRAMUSCULAR | Status: DC | PRN
Start: 2016-07-30 — End: 2016-07-31
  Administered 2016-07-30: 2 mg via INTRAVENOUS
  Filled 2016-07-30: qty 1

## 2016-07-30 NOTE — Progress Notes (Signed)
[facility]  Daily Progress Note   Subjective  - * No surgery found *  Patient confused, unable to provide history.    Objective Vitals:   07/30/16 1530 07/30/16 1600 07/30/16 1630 07/30/16 1700  BP: (!) 97/52 (!) 111/59 93/82 116/65  Pulse: 70 69 71 75  Resp: 15 15 15 16   Temp:      TempSrc:      SpO2:  100% 97% 99%  Weight:      Height:        Intake/Output Summary (Last 24 hours) at 07/30/16 1736 Last data filed at 07/30/16 1004  Gross per 24 hour  Intake             1425 ml  Output             -836 ml  Net             2261 ml    PULM  CTAB CV  RRR VASC  Incision C/D/I. No erythema or drainage.  No fluctuance or tenderness.  Feet are reasonably warm with good capillary refill although unable to palpate pulses today.  Laboratory CBC    Component Value Date/Time   WBC 5.6 07/30/2016 0325   HGB 8.0 (L) 07/30/2016 0325   HCT 23.7 (L) 07/30/2016 0325   PLT 210 07/30/2016 0325    BMET    Component Value Date/Time   NA 135 07/30/2016 0325   K 4.4 07/30/2016 0325   CL 93 (L) 07/30/2016 0325   CO2 32 07/30/2016 0325   GLUCOSE 110 (H) 07/30/2016 0325   BUN 27 (H) 07/30/2016 0325   CREATININE 5.71 (H) 07/30/2016 0325   CALCIUM 7.9 (L) 07/30/2016 0325   GFRNONAA 10 (L) 07/30/2016 0325   GFRAA 11 (L) 07/30/2016 0325    Assessment/Planning: POD #27 s/p extensive RLE revascularization   I have reviewed his CT scan.  The hematoma in the right groin is quite small and would not explain a significant anemia.  The site is C/D/I and has no erythema or drainage from the wound.  It does not appear infected and there is not significant active bleeding.  The CT is only slightly different from the scan on 5/8 and is of poor quality.  Would not recommend angiogram or invasive evaluation at this point, although this can be considered in the future.  3-4 weeks postoperatively would be very difficult to repair any issues there.  Would at least continue  antiplatelet therapy as his PAD is horrific and he is facing a limb loss situation bilaterally.    Festus BarrenJason Loreli Debruler  07/30/2016, 5:36 PM

## 2016-07-30 NOTE — H&P (Signed)
Report called to receiving nurse Marcella. Pt transported on 2L oxygen and cardiac monitor.

## 2016-07-30 NOTE — Progress Notes (Signed)
[facility], [place] 07/30/16  Subjective:   Patient is more alert today, but not bask to baseline Nursing staff does not report any acute events No shortness of breath     Objective:  Vital signs in last 24 hours:  Temp:  [97.8 F (36.6 C)-99.3 F (37.4 C)] 98.7 F (37.1 C) (05/31 1252) Pulse Rate:  [61-99] 68 (05/31 1252) Resp:  [8-21] 16 (05/31 1252) BP: (80-119)/(44-83) 97/49 (05/31 1252) SpO2:  [91 %-100 %] 95 % (05/31 0508) Weight:  [79.1 kg (174 lb 6.1 oz)-80.2 kg (176 lb 12.9 oz)] 80.2 kg (176 lb 12.9 oz) (05/31 0500)  Weight change: 1.1 kg (2 lb 6.8 oz) Filed Weights   07/29/16 0500 07/29/16 1755 07/30/16 0500  Weight: 79.1 kg (174 lb 6.1 oz) 79.1 kg (174 lb 6.1 oz) 80.2 kg (176 lb 12.9 oz)    Intake/Output:    Intake/Output Summary (Last 24 hours) at 07/30/16 1316 Last data filed at 07/30/16 1004  Gross per 24 hour  Intake             1425 ml  Output             -836 ml  Net             2261 ml     Physical Exam: General: Chronically Ill appearing  HEENT Dry oral mucus membranes  Neck supple  Pulm/lungs Shallow breathing,left basilar crackles  CVS/Heart tachycardic  Abdomen:  Doughy consistency, NT  Extremities: Trace edema  Neurologic: alert today, able to follow simple commands, no back to baseline per familt  Skin: No acute rashes, slight bleed over left ear- ? scratching  Access LUE AVG       Basic Metabolic Panel:   Recent Labs Lab 07/08/2016 1029 07/29/16 0351 07/30/16 0325  NA 131* 130* 135  K 4.7 4.6 4.4  CL 93* 91* 93*  CO2 27 26 32  GLUCOSE 56* 88 110*  BUN 47* 53* 27*  CREATININE 8.60* 9.25* 5.71*  CALCIUM 8.1* 8.0* 7.9*     CBC:  Recent Labs Lab 07/24/16 0334 07/07/2016 1029 06/30/2016 1552 07/29/16 0351 07/30/16 0325  WBC 5.7 7.1  --  6.0 5.6  NEUTROABS  --  5.7  --   --   --   HGB 8.3* 6.7* 7.1* 6.8* 8.0*  HCT 24.3* 20.1*  --  20.3* 23.7*  MCV 89.0 89.0  --  87.6 85.6  PLT 208 208  --   203 210      Lab Results  Component Value Date   HEPBSAG Negative 07/07/2016      Microbiology:  Recent Results (from the past 240 hour(s))  Blood Culture (routine x 2)     Status: None (Preliminary result)   Collection Time: 07/10/2016 10:30 AM  Result Value Ref Range Status   Specimen Description BLOOD  RIGHT HAND  Final   Special Requests BOTTLES DRAWN AEROBIC AND ANAEROBIC  BCAV  Final   Culture NO GROWTH 2 DAYS  Final   Report Status PENDING  Incomplete  Blood Culture (routine x 2)     Status: None (Preliminary result)   Collection Time: 07/24/2016 10:30 AM  Result Value Ref Range Status   Specimen Description BLOOD  RIGHT FOREARM  Final   Special Requests   Final    BOTTLES DRAWN AEROBIC AND ANAEROBIC Blood Culture results may not be optimal due to an excessive volume of blood received in culture bottles   Culture NO GROWTH  2 DAYS  Final   Report Status PENDING  Incomplete  MRSA PCR Screening     Status: None   Collection Time: 03/28/16  2:15 PM  Result Value Ref Range Status   MRSA by PCR NEGATIVE NEGATIVE Final    Comment:        The GeneXpert MRSA Assay (FDA approved for NASAL specimens only), is one component of a comprehensive MRSA colonization surveillance program. It is not intended to diagnose MRSA infection nor to guide or monitor treatment for MRSA infections.     Coagulation Studies:  Recent Labs  03/28/16 1552 07/29/16 0351 07/30/16 0325  LABPROT 29.4* 33.3* 38.7*  INR 2.72 3.18 3.84    Urinalysis: No results for input(s): COLORURINE, LABSPEC, PHURINE, GLUCOSEU, HGBUR, BILIRUBINUR, KETONESUR, PROTEINUR, UROBILINOGEN, NITRITE, LEUKOCYTESUR in the last 72 hours.  Invalid input(s): APPERANCEUR    Imaging: No results found.   Medications:   . sodium chloride Stopped (03/28/16 2316)  . sodium chloride    . dextrose 30 mL/hr at 07/29/16 1713  . piperacillin-tazobactam (ZOSYN)  IV 3.375 g (07/30/16 1004)  . vancomycin Stopped (07/29/16  2110)   . insulin aspart  0-9 Units Subcutaneous Q4H  . polyethylene glycol  17 g Oral Daily  . sodium chloride flush  3 mL Intravenous Q12H   acetaminophen **OR** acetaminophen, albuterol, ondansetron **OR** ondansetron (ZOFRAN) IV, polyethylene glycol  Assessment/ Plan:  61 y.o.hispanic  male ESRD on HD TTS, coronary artery disease, diabetes mellitus type 2, hyperlipidemia, hypertension, history of CVA, anemia chronic kidney disease, secondary hyperparathyroidism   [facility] TTS [address] Left AVF   1.  ESRD on HD TTS:      hemodialysis plan for today to get him back on usual schedule  2.  Anemia chronic kidney disease:  Hemoglobin 8.0  will continue EPO with HD  3.   Fever, hypotension, altered mental status - in work up for sepsis - broad spectrum Abx  4. 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 Alberteen Spindleline on 5/23.  5.  Congestive heart failure, ch systolc Echo EF 30-35%.noted on 07/29/16       LOS: 2 [facility],Azani Brogdon 5/31/20181:16 PM  [facility] [address] [place], [place] [phone]

## 2016-07-30 NOTE — Progress Notes (Signed)
Pt trying to pull lines, intermittently restlessness, [name], RN notified and requested family member to come and sit with pt/sitter, will continue to monitor closely and diligently.

## 2016-07-30 NOTE — Progress Notes (Signed)
CH responded to an OR for AD completion. Pt presented somewhat confused. Family and interpretor are bedside. CH assessed Pt is not in a good state of mind for AD completion at this time. Family understands. CH is available for assistance in completion when Pt is clearer.     07/30/16 1000  Clinical Encounter Type  Visited With Patient;Patient and family together;Health care provider  Visit Type Initial;Spiritual support  Referral From Nurse  Consult/Referral To Chaplain  Spiritual Encounters  Spiritual Needs Literature

## 2016-07-30 NOTE — Progress Notes (Signed)
[facility] - [place] at [facility]   PATIENT NAME: Eddie DraftsMauro Myers    MR#:  [id number]  DATE OF BIRTH:  Sep 26, 1955  SUBJECTIVE:  CHIEF COMPLAINT:   Chief Complaint  Patient presents with  . 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 Alberteen Spindleline 07/22/2016, hypertension, diabetes, coronary artery disease, end-stage renal disease, on dialysis Tuesday, Thursday, Saturdays, peripheral artery disease, who presents to the hospital with altered mental status and fever to 100.3. He was also noted to be anemic with hemoglobin level of 6.7. Chest x-ray revealed mild cardiomegaly and CHF. Patient remains on room air. He remains confused, Encephalopathic , per family members, cannot remember what happened, where he is. The patient complains of right-sided abdominal pain  Review of Systems  Unable to perform ROS: Mental status change    VITAL SIGNS: Blood pressure (!) 97/49, pulse 68, temperature 98.7 F (37.1 C), temperature source Axillary, resp. rate 16, height 5\' 8"  (1.727 m), weight 80.2 kg (176 lb 12.9 oz), SpO2 95 %.  PHYSICAL EXAMINATION:   GENERAL:  61 y.o.-year-old patient lying in the bed with no acute distress, somewhat somnolent, but awakened, not able to answer simple questions by family members or interpreter, intermittently follows commands.  EYES: Pupils equal, round, reactive to light and accommodation. No scleral icterus. Extraocular muscles intact.  HEENT: Head atraumatic, normocephalic. Oropharynx and nasopharynx clear.  NECK:  Supple, no jugular venous distention. No thyroid enlargement, no tenderness.  LUNGS: Normal breath sounds bilaterally, no wheezing, rales,rhonchi or crepitation. No use of accessory muscles of respiration.  CARDIOVASCULAR: S1, S2 normal. No murmurs, rubs, or gallops.  ABDOMEN: Soft, mild tender in the right  side, but no rebound or guarding, nondistended. Bowel sounds present. No organomegaly or mass.  EXTREMITIES: Trace lower tibial edema, Not able to see if patient had pedal edema, cyanosis, or clubbing due to bilateral feet dressings NEUROLOGIC: Cranial nerves II through XII are intact. Muscle strength 5/5 in all extremities. Sensation intact. Gait not checked.  PSYCHIATRIC: The patient is alert , disoriented SKIN: No obvious rash, lesion, or ulcer.   ORDERS/RESULTS REVIEWED:   CBC  Recent Labs Lab 07/24/16 0334 02-25-17 1029 02-25-17 1552 07/29/16 0351 07/30/16 0325  WBC 5.7 7.1  --  6.0 5.6  HGB 8.3* 6.7* 7.1* 6.8* 8.0*  HCT 24.3* 20.1*  --  20.3* 23.7*  PLT 208 208  --  203 210  MCV 89.0 89.0  --  87.6 85.6  MCH 30.3 29.9  --  29.2 28.9  MCHC 34.1 33.5  --  33.3 33.8  RDW 17.3* 17.3*  --  18.2* 18.1*  LYMPHSABS  --  0.6*  --   --   --   MONOABS  --  0.5  --   --   --   EOSABS  --  0.2  --   --   --   BASOSABS  --  0.0  --   --   --    ------------------------------------------------------------------------------------------------------------------  Chemistries   Recent Labs Lab 02-25-17 1029 07/29/16 0351 07/30/16 0325  NA 131* 130* 135  K 4.7 4.6 4.4  CL 93* 91* 93*  CO2 27 26 32  GLUCOSE 56* 88 110*  BUN 47* 53* 27*  CREATININE 8.60* 9.25* 5.71*  CALCIUM 8.1* 8.0* 7.9*  AST 32 28  --   ALT 21 18  --  ALKPHOS 262* 230*  --   BILITOT 0.7 1.0  --    ------------------------------------------------------------------------------------------------------------------ estimated creatinine clearance is 13 mL/min (A) (by C-G formula based on SCr of 5.71 mg/dL (H)). ------------------------------------------------------------------------------------------------------------------ No results for input(s): TSH, T4TOTAL, T3FREE, THYROIDAB in the last 72 hours.  Invalid input(s): FREET3  Cardiac Enzymes  Recent Labs Lab 07/13/2016 1029 07/04/2016 1552 07/23/2016 2146    TROPONINI 0.51* 0.53* 0.45*   ------------------------------------------------------------------------------------------------------------------ Invalid input(s): POCBNP ---------------------------------------------------------------------------------------------------------------  RADIOLOGY: Ct Chest W Contrast  Result Date: 07/30/2016 CLINICAL DATA:  61 year old male with fever, hypotension, sepsis. End-stage renal disease on dialysis. Status post right femoral vascular surgery earlier this month. EXAM: CT CHEST, ABDOMEN, AND PELVIS WITH CONTRAST TECHNIQUE: Multidetector CT imaging of the chest, abdomen and pelvis was performed following the standard protocol during bolus administration of intravenous contrast. CONTRAST:  ISOVUE-300 IOPAMIDOL (ISOVUE-300) INJECTION 61% COMPARISON:  Pelvis CT with contrast 07/07/2016. Chest radiographs 07/17/2016 and earlier. FINDINGS: CT CHEST FINDINGS Cardiovascular: Mild cardiomegaly. Calcified coronary artery and aortic atherosclerosis. Left subclavian vascular stent. Suboptimal intravascular contrast bolus, such that great vessel patency is not well evaluated. Mediastinum/Nodes: There are number of small calcified mediastinal and hilar lymph nodes. Superimposed larger anterior carina lymph nodes up to 17 mm short axis. Lungs/Pleura: Major airways are patent aside from atelectatic changes at both hila. Mild diffuse pulmonary septal thickening. Intermittent right upper lobe subpleural and centrilobular nodularity is superimposed. No similar nodularity in the left lower lobe. Confluent mostly dependent opacity in the lingula and both lower lobes. Superimposed small layering pleural effusions. Superimposed calcified granuloma in the right lower lobe. Musculoskeletal: Prior sternotomy. No acute osseous abnormality identified. CT ABDOMEN PELVIS FINDINGS Hepatobiliary: Small volume nonspecific perihepatic fluid. Negative liver. No definite gallbladder inflammation. No  biliary ductal enlargement identified. Pancreas: Negative. Spleen: Negative except for a small volume of perisplenic fluid. Adrenals/Urinary Tract: Normal adrenal glands. Poor bilateral renal enhancement and no significant renal contrast excretion on delayed images. Bilateral renal artery calcified atherosclerosis. Nonspecific perinephric stranding. No suspicious renal lesion. Mildly distended and otherwise unremarkable urinary bladder. Stomach/Bowel: Negative rectum. Negative sigmoid colon aside from retained stool. Negative left colon and transverse colon aside from retained stool. Small volume of fluid in the right gutter with mild right colonic wall thickening. The appendix appears stable. Oral contrast has just reached the ileocecal valve. No dilated small bowel. Decompressed stomach. Negative duodenum. Small volume upper abdominal fluid including along the inferior liver margin and about the spleen. No pneumoperitoneum. Vascular/Lymphatic: Extensive calcified atherosclerosis throughout the abdomen and pelvis including the aorta, and iliac, and femoral arteries suboptimal intravascular contrast bolus such that vascular patency is not well evaluated. There is a 2.6 cm hematoma or pseudoaneurysm at the bifurcation of the right common femoral artery as seen on series 2, image 128 which is new since 07/07/2016. There is also a 3 cm simple fluid density collection superficial to the right common femoral artery now (series 2, image 118). There were postoperative changes with a small volume of soft tissue gas at both of these locations on the prior study. No lymphadenopathy in the abdomen or pelvis. Reproductive: Negative. Other: Small volume simple density pelvic free fluid. Musculoskeletal: Mild grade 1 anterolisthesis of L4 on L5. No acute osseous abnormality identified. IMPRESSION: 1. There is a 2.6 cm hematoma versus pseudoaneurysm at the bifurcation of the right common femoral artery in the right inguinal region  which is new compared to 07/07/2016. Recommend further evaluation with vascular ultrasound. Superimposed superficial 3 cm probable liquefying hematoma  superficial to the right CFA. 2. Small bilateral layering pleural effusions. Nonspecific small volume of free fluid in the abdomen and pelvis. 3. Combination of pulmonary septal thickening with mild right upper lobe nodularity, lingula and bilateral lower lobe dependent and peribronchial opacity. Differential considerations include Pulmonary Edema with atelectasis versus Viral/atypical Respiratory Infection. 4. Underlying remote thoracic granulomatous disease. Superimposed reactive appearing mediastinal lymphadenopathy felt related to #3. 5. Up to mild wall thickening of the right colon could be reactive due to the nearby free fluid in the right gutter or reflect mild focal colitis. No other bowel inflammation. 6. Cardiomegaly and extensive calcified atherosclerosis throughout the chest abdomen and pelvis. Suboptimal intravascular contrast bolus such that vascular patency is not well evaluated on this study. Electronically Signed   By: Odessa Fleming M.D.   On: 07/30/2016 14:37   Ct Abdomen Pelvis W Contrast  Result Date: 07/30/2016 CLINICAL DATA:  61 year old male with fever, hypotension, sepsis. End-stage renal disease on dialysis. Status post right femoral vascular surgery earlier this month. EXAM: CT CHEST, ABDOMEN, AND PELVIS WITH CONTRAST TECHNIQUE: Multidetector CT imaging of the chest, abdomen and pelvis was performed following the standard protocol during bolus administration of intravenous contrast. CONTRAST:  ISOVUE-300 IOPAMIDOL (ISOVUE-300) INJECTION 61% COMPARISON:  Pelvis CT with contrast 07/07/2016. Chest radiographs 2016-08-18 and earlier. FINDINGS: CT CHEST FINDINGS Cardiovascular: Mild cardiomegaly. Calcified coronary artery and aortic atherosclerosis. Left subclavian vascular stent. Suboptimal intravascular contrast bolus, such that great vessel  patency is not well evaluated. Mediastinum/Nodes: There are number of small calcified mediastinal and hilar lymph nodes. Superimposed larger anterior carina lymph nodes up to 17 mm short axis. Lungs/Pleura: Major airways are patent aside from atelectatic changes at both hila. Mild diffuse pulmonary septal thickening. Intermittent right upper lobe subpleural and centrilobular nodularity is superimposed. No similar nodularity in the left lower lobe. Confluent mostly dependent opacity in the lingula and both lower lobes. Superimposed small layering pleural effusions. Superimposed calcified granuloma in the right lower lobe. Musculoskeletal: Prior sternotomy. No acute osseous abnormality identified. CT ABDOMEN PELVIS FINDINGS Hepatobiliary: Small volume nonspecific perihepatic fluid. Negative liver. No definite gallbladder inflammation. No biliary ductal enlargement identified. Pancreas: Negative. Spleen: Negative except for a small volume of perisplenic fluid. Adrenals/Urinary Tract: Normal adrenal glands. Poor bilateral renal enhancement and no significant renal contrast excretion on delayed images. Bilateral renal artery calcified atherosclerosis. Nonspecific perinephric stranding. No suspicious renal lesion. Mildly distended and otherwise unremarkable urinary bladder. Stomach/Bowel: Negative rectum. Negative sigmoid colon aside from retained stool. Negative left colon and transverse colon aside from retained stool. Small volume of fluid in the right gutter with mild right colonic wall thickening. The appendix appears stable. Oral contrast has just reached the ileocecal valve. No dilated small bowel. Decompressed stomach. Negative duodenum. Small volume upper abdominal fluid including along the inferior liver margin and about the spleen. No pneumoperitoneum. Vascular/Lymphatic: Extensive calcified atherosclerosis throughout the abdomen and pelvis including the aorta, and iliac, and femoral arteries suboptimal  intravascular contrast bolus such that vascular patency is not well evaluated. There is a 2.6 cm hematoma or pseudoaneurysm at the bifurcation of the right common femoral artery as seen on series 2, image 128 which is new since 07/07/2016. There is also a 3 cm simple fluid density collection superficial to the right common femoral artery now (series 2, image 118). There were postoperative changes with a small volume of soft tissue gas at both of these locations on the prior study. No lymphadenopathy in the abdomen or pelvis. Reproductive: Negative.  Other: Small volume simple density pelvic free fluid. Musculoskeletal: Mild grade 1 anterolisthesis of L4 on L5. No acute osseous abnormality identified. IMPRESSION: 1. There is a 2.6 cm hematoma versus pseudoaneurysm at the bifurcation of the right common femoral artery in the right inguinal region which is new compared to 07/07/2016. Recommend further evaluation with vascular ultrasound. Superimposed superficial 3 cm probable liquefying hematoma superficial to the right CFA. 2. Small bilateral layering pleural effusions. Nonspecific small volume of free fluid in the abdomen and pelvis. 3. Combination of pulmonary septal thickening with mild right upper lobe nodularity, lingula and bilateral lower lobe dependent and peribronchial opacity. Differential considerations include Pulmonary Edema with atelectasis versus Viral/atypical Respiratory Infection. 4. Underlying remote thoracic granulomatous disease. Superimposed reactive appearing mediastinal lymphadenopathy felt related to #3. 5. Up to mild wall thickening of the right colon could be reactive due to the nearby free fluid in the right gutter or reflect mild focal colitis. No other bowel inflammation. 6. Cardiomegaly and extensive calcified atherosclerosis throughout the chest abdomen and pelvis. Suboptimal intravascular contrast bolus such that vascular patency is not well evaluated on this study. Electronically  Signed   By: Odessa Fleming M.D.   On: 07/30/2016 14:37    EKG:  Orders placed or performed during the hospital encounter of 07/17/2016  . ED EKG 12-Lead  . ED EKG 12-Lead  . EKG 12-Lead  . EKG 12-Lead    ASSESSMENT AND PLAN:  Active Problems:   Encephalopathy acute   ESRD (end stage renal disease) (HCC)  #1. Acute encephalopathy, likely metabolic due to infection, fever, hypoglycemia, No significant improvement with therapy as of yet , head CT was unremarkable, getting brain MRI, neurology consultation #2.  sepsis, hypotension, hypoglycemia, continue Zosyn therapy, discontinue vancomycin, blood cultures are negative , MRSA PCR is negative, urine culture is  pending,  podiatrist felt that patient's wound is healing nicely, not a site for infection #3. Ascending colon colitis, continue Zosyn, clear liquid diet, follow clinically #4 hypotension, suspended blood pressure medications, blood pressure remained low, suspected due to anemia and infection, questionable continuous blood loss #5, hypoglycemia in diabetic, hemoglobin A1c 6.0, continue dextrose solution, continue clear liquid diet #6. End-stage renal disease, hemodialysis per nephrologist #7. Anemia, suspected acute posthemorrhagic anemia due to right inguinal region hematoma, as noted on CT scan today,   status post 2 units of packed red blood cell transfusion,stable hemoglobin level , patient was on aspirin, Coumadin at home, getting vascular surgery consultation, administer vitamin k, may need to to be transfused with fresh frozen plasma, holding aspirin, Coumadin, follow closely. Discussed with Dr. Wyn Quaker  #8 coagulopathy due to Coumadin, transfuse fresh frozen plasma, given vitamin K intravenously, follow pro time INR after transfusion  . #9 suspected hematoma versus pseudoaneurysm at the bifurcation of right common femoral artery, vascular ultrasound is ordered, discussed with Dr. dew, consultation is requested, pending  Management plans  discussed with the patient, family and they are in agreement.   DRUG ALLERGIES: No Known Allergies  CODE STATUS:     Code Status Orders        Start     Ordered   07/29/2016 1331  Full code  Continuous     07/14/2016 1331    Code Status History    Date Active Date Inactive Code Status Order ID Comments User Context   07/20/2016 10:44 AM 07/24/2016  9:34 PM Full Code [id number]  Ramonita Lab, MD Inpatient   06/24/2016  2:41 PM 07/08/2016  6:16 PM  Full Code [id number]  Houston Siren, MD Inpatient   01/28/2016 11:52 AM 01/28/2016  4:07 PM Full Code [id number]  Schnier, Latina Craver, MD Inpatient      TOTAL Critical careTIME TAKING CARE OF THIS PATIENT: 45 minutes.    Katharina Caper M.D on 07/30/2016 at 2:49 PM  Between 7am to 6pm - Pager - [phone]  After 6pm go to [email or web address] - password [email or web address]  [facility]  Office  [phone]  CC: Primary care physician; System, Pcp Not In

## 2016-07-31 ENCOUNTER — Inpatient Hospital Stay: Payer: Medicare Other

## 2016-07-31 ENCOUNTER — Encounter: Payer: Self-pay | Admitting: Nurse Practitioner

## 2016-07-31 DIAGNOSIS — Z7189 Other specified counseling: Secondary | ICD-10-CM

## 2016-07-31 DIAGNOSIS — I480 Paroxysmal atrial fibrillation: Secondary | ICD-10-CM

## 2016-07-31 DIAGNOSIS — I6310 Cerebral infarction due to embolism of unspecified precerebral artery: Secondary | ICD-10-CM

## 2016-07-31 DIAGNOSIS — I639 Cerebral infarction, unspecified: Secondary | ICD-10-CM

## 2016-07-31 DIAGNOSIS — Z515 Encounter for palliative care: Secondary | ICD-10-CM

## 2016-07-31 LAB — CBC
HCT: 24.6 % — ABNORMAL LOW (ref 40.0–52.0)
Hemoglobin: 8.3 g/dL — ABNORMAL LOW (ref 13.0–18.0)
MCH: 29.4 pg (ref 26.0–34.0)
MCHC: 33.6 g/dL (ref 32.0–36.0)
MCV: 87.4 fL (ref 80.0–100.0)
PLATELETS: 213 10*3/uL (ref 150–440)
RBC: 2.81 MIL/uL — AB (ref 4.40–5.90)
RDW: 17.5 % — ABNORMAL HIGH (ref 11.5–14.5)
WBC: 6.1 10*3/uL (ref 3.8–10.6)

## 2016-07-31 LAB — BPAM FFP
Blood Product Expiration Date: 201806052359
ISSUE DATE / TIME: 201805311928
Unit Type and Rh: 6200

## 2016-07-31 LAB — AMMONIA: Ammonia: 19 umol/L (ref 9–35)

## 2016-07-31 LAB — PREPARE FRESH FROZEN PLASMA: Unit division: 0

## 2016-07-31 LAB — GLUCOSE, CAPILLARY
GLUCOSE-CAPILLARY: 152 mg/dL — AB (ref 65–99)
GLUCOSE-CAPILLARY: 149 mg/dL — AB (ref 65–99)
GLUCOSE-CAPILLARY: 217 mg/dL — AB (ref 65–99)

## 2016-07-31 LAB — PROTIME-INR
INR: 3.46
PROTHROMBIN TIME: 35.6 s — AB (ref 11.4–15.2)

## 2016-07-31 MED ORDER — HALOPERIDOL LACTATE 5 MG/ML IJ SOLN: 0.5000 mg | INTRAMUSCULAR | Status: DC | PRN

## 2016-07-31 MED ORDER — GLYCOPYRROLATE 0.2 MG/ML IJ SOLN
0.2000 mg | INTRAMUSCULAR | Status: DC | PRN
  Filled 2016-07-31: qty 1

## 2016-07-31 MED ORDER — HALOPERIDOL LACTATE 2 MG/ML PO CONC
0.5000 mg | ORAL | Status: DC | PRN
  Filled 2016-07-31: qty 0.3

## 2016-07-31 MED ORDER — ACETAMINOPHEN 650 MG RE SUPP: 650.0000 mg | Freq: Four times a day (QID) | RECTAL | Status: DC | PRN

## 2016-07-31 MED ORDER — ACETAMINOPHEN 325 MG PO TABS: 650.0000 mg | ORAL_TABLET | Freq: Four times a day (QID) | ORAL | Status: DC | PRN

## 2016-07-31 MED ORDER — BIOTENE DRY MOUTH MT LIQD: 15.0000 mL | OROMUCOSAL | Status: DC | PRN

## 2016-07-31 MED ORDER — PROTHROMBIN COMPLEX CONC HUMAN 500 UNITS IV KIT
25.0000 [IU]/kg | PACK | Status: DC
  Filled 2016-07-31: qty 81

## 2016-07-31 MED ORDER — VITAMIN K1 10 MG/ML IJ SOLN
10.0000 mg | INTRAMUSCULAR | Status: DC
  Filled 2016-07-31: qty 1

## 2016-07-31 MED ORDER — ONDANSETRON HCL 4 MG/2ML IJ SOLN: 4.0000 mg | Freq: Four times a day (QID) | INTRAMUSCULAR | Status: DC | PRN

## 2016-07-31 MED ORDER — HALOPERIDOL 0.5 MG PO TABS
0.5000 mg | ORAL_TABLET | ORAL | Status: DC | PRN
  Filled 2016-07-31: qty 1

## 2016-07-31 MED ORDER — ONDANSETRON 4 MG PO TBDP
4.0000 mg | ORAL_TABLET | Freq: Four times a day (QID) | ORAL | Status: DC | PRN
  Filled 2016-07-31: qty 1

## 2016-07-31 MED ORDER — POLYVINYL ALCOHOL 1.4 % OP SOLN
1.0000 [drp] | Freq: Four times a day (QID) | OPHTHALMIC | Status: DC | PRN
  Filled 2016-07-31: qty 15

## 2016-07-31 MED ORDER — MORPHINE SULFATE (PF) 2 MG/ML IV SOLN
2.0000 mg | INTRAVENOUS | Status: DC | PRN
  Administered 2016-07-31 – 2016-08-01 (×4): 2 mg via INTRAVENOUS
  Filled 2016-07-31 (×4): qty 1

## 2016-07-31 MED ORDER — GLYCOPYRROLATE 1 MG PO TABS
1.0000 mg | ORAL_TABLET | ORAL | Status: DC | PRN
  Filled 2016-07-31: qty 1

## 2016-07-31 MED ORDER — GLYCOPYRROLATE 0.2 MG/ML IJ SOLN
0.2000 mg | INTRAMUSCULAR | Status: DC | PRN
  Administered 2016-07-31: 0.2 mg via INTRAVENOUS
  Filled 2016-07-31 (×2): qty 1

## 2016-07-31 MED ORDER — LORAZEPAM 2 MG/ML IJ SOLN
0.5000 mg | INTRAMUSCULAR | Status: DC | PRN
  Administered 2016-07-31: 22:00:00 0.5 mg via INTRAVENOUS
  Filled 2016-07-31: qty 1

## 2016-07-31 NOTE — Progress Notes (Signed)
Patient received one unit of plasma finishing around 2245.  Patient continued to have agitation and confusion requiring 1:1 sitter for safety.  RN consulted with on call MD, Willis regarding need for additional agitation medication, pending order for antibiotic and possible second unit of plasma.  RN to give antibiotic, observe for any acute changes and report to on coming shift second unit not received.

## 2016-07-31 NOTE — Progress Notes (Signed)
Received call from MD regarding results of Head CT. Md shared with me that results were significant and likely life threatening to the patient at this point. MD and I spoke to the family via interpreter about these results and what family preference is moving forward. After some deliberation family voiced their desire for comfort care and to make the patient a DNR. The proper form was filled out and orders for transfer were placed. In addition comfort medications were ordered for the patient and IVF and telemetry were discont. Family prefers to keep oxygen on the patient at this time. Family voiced desire to take patient home with hospice if possible so a case management consult was placed. This RN notified nephrologist of plans so that no new HD orders were initiated. Morphine given for resp distress and suctioning performed for dyspnea. Currently awaiting bed for transfer and family remains at bedside.

## 2016-07-31 NOTE — Progress Notes (Addendum)
[facility] [place] at [facility]   PATIENT NAME: Eddie Myers    MR#:  [id number]  DATE OF BIRTH:  22-Sep-1955  SUBJECTIVE:  CHIEF COMPLAINT:   Chief Complaint  Patient presents with  . 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 Alberteen Spindle 07/22/2016, hypertension, diabetes, coronary artery disease, end-stage renal disease, on dialysis Tuesday, Thursday, Saturdays, peripheral artery disease, who presents to the hospital with altered mental status and fever to 100.3. He was also noted to be anemic with hemoglobin level of 6.7. Chest x-ray revealed mild cardiomegaly and CHF. Patient remains on room air. He remains confused, Encephalopathic, Does not eat today, very lethargic. MRI revealed embolic stroke, predominately on the left side. The patient was seen by neurologist palliative care team, further discussions with the family for today evening was planned by nursing staff    Review of Systems  Unable to perform ROS: Mental status change    VITAL SIGNS: Blood pressure (!) 115/55, pulse 79, temperature 98.2 F (36.8 C), temperature source Oral, resp. rate 18, height 5\' 8"  (1.727 m), weight 81 kg (178 lb 9.2 oz), SpO2 100 %.  PHYSICAL EXAMINATION:   GENERAL:  61 y.o.-year-old patient lying in the bed with no acute distress, somnolent, does not open eyes, does not speak, does not  follow commands, Moves upper extremities, less movements in the left upper extremity, weakness of the left facial muscles.  EYES: Pupils equal, round, reactive to light and accommodation. No scleral icterus. Extraocular muscles intact.  HEENT: Head atraumatic, normocephalic. Oropharynx and nasopharynx clear.  NECK:  Supple, no jugular venous distention. No thyroid enlargement, no tenderness.  LUNGS: Normal breath sounds bilaterally, no wheezing, rales,rhonchi or  crepitation. No use of accessory muscles of respiration.  CARDIOVASCULAR: S1, S2 normal. No murmurs, rubs, or gallops.  ABDOMEN: Soft, mild tender in the right side, but no rebound or guarding, nondistended. Bowel sounds present. No organomegaly or mass.  EXTREMITIES: Trace lower tibial edema, Not able to see if patient had pedal edema, cyanosis, or clubbing due to bilateral feet dressings NEUROLOGIC: Cranial nerves II through XII difficult to interpret, some weakness of left facial muscles. Muscle strength 4/5 in  right-sided  extremities. Sensation not able to interpret, gait was not checked.  PSYCHIATRIC: The  Not able to assess his orientation, somnolent Skin: No obvious rash, lesion, or ulcer.   ORDERS/RESULTS REVIEWED:   CBC  Recent Labs Lab August 19, 2016 1029 August 19, 2016 1552 07/29/16 0351 07/30/16 0325 07/30/16 1756 07/31/16 0430  WBC 7.1  --  6.0 5.6  --  6.1  HGB 6.7* 7.1* 6.8* 8.0* 8.2* 8.3*  HCT 20.1*  --  20.3* 23.7*  --  24.6*  PLT 208  --  203 210  --  213  MCV 89.0  --  87.6 85.6  --  87.4  MCH 29.9  --  29.2 28.9  --  29.4  MCHC 33.5  --  33.3 33.8  --  33.6  RDW 17.3*  --  18.2* 18.1*  --  17.5*  LYMPHSABS 0.6*  --   --   --   --   --   MONOABS 0.5  --   --   --   --   --   EOSABS 0.2  --   --   --   --   --   BASOSABS 0.0  --   --   --   --   --    ------------------------------------------------------------------------------------------------------------------  Chemistries   Recent Labs Lab 08/20/16 1029 07/29/16 0351 07/30/16 0325 07/30/16 1756  NA 131* 130* 135  --   K 4.7 4.6 4.4  --   CL 93* 91* 93*  --   CO2 27 26 32  --   GLUCOSE 56* 88 110*  --   BUN 47* 53* 27*  --   CREATININE 8.60* 9.25* 5.71*  --   CALCIUM 8.1* 8.0* 7.9*  --   MG  --   --   --  1.8  AST 32 28  --   --   ALT 21 18  --   --   ALKPHOS 262* 230*  --   --   BILITOT 0.7 1.0  --   --     ------------------------------------------------------------------------------------------------------------------ estimated creatinine clearance is 13 mL/min (A) (by C-G formula based on SCr of 5.71 mg/dL (H)). ------------------------------------------------------------------------------------------------------------------ No results for input(s): TSH, T4TOTAL, T3FREE, THYROIDAB in the last 72 hours.  Invalid input(s): FREET3  Cardiac Enzymes  Recent Labs Lab 08/20/2016 1029 08-20-2016 1552 2016-08-20 2146  TROPONINI 0.51* 0.53* 0.45*   ------------------------------------------------------------------------------------------------------------------ Invalid input(s): POCBNP ---------------------------------------------------------------------------------------------------------------  RADIOLOGY: Ct Chest W Contrast  Result Date: 07/30/2016 CLINICAL DATA:  61 year old male with fever, hypotension, sepsis. End-stage renal disease on dialysis. Status post right femoral vascular surgery earlier this month. EXAM: CT CHEST, ABDOMEN, AND PELVIS WITH CONTRAST TECHNIQUE: Multidetector CT imaging of the chest, abdomen and pelvis was performed following the standard protocol during bolus administration of intravenous contrast. CONTRAST:  ISOVUE-300 IOPAMIDOL (ISOVUE-300) INJECTION 61% COMPARISON:  Pelvis CT with contrast 07/07/2016. Chest radiographs 08-20-2016 and earlier. FINDINGS: CT CHEST FINDINGS Cardiovascular: Mild cardiomegaly. Calcified coronary artery and aortic atherosclerosis. Left subclavian vascular stent. Suboptimal intravascular contrast bolus, such that great vessel patency is not well evaluated. Mediastinum/Nodes: There are number of small calcified mediastinal and hilar lymph nodes. Superimposed larger anterior carina lymph nodes up to 17 mm short axis. Lungs/Pleura: Major airways are patent aside from atelectatic changes at both hila. Mild diffuse pulmonary septal thickening.  Intermittent right upper lobe subpleural and centrilobular nodularity is superimposed. No similar nodularity in the left lower lobe. Confluent mostly dependent opacity in the lingula and both lower lobes. Superimposed small layering pleural effusions. Superimposed calcified granuloma in the right lower lobe. Musculoskeletal: Prior sternotomy. No acute osseous abnormality identified. CT ABDOMEN PELVIS FINDINGS Hepatobiliary: Small volume nonspecific perihepatic fluid. Negative liver. No definite gallbladder inflammation. No biliary ductal enlargement identified. Pancreas: Negative. Spleen: Negative except for a small volume of perisplenic fluid. Adrenals/Urinary Tract: Normal adrenal glands. Poor bilateral renal enhancement and no significant renal contrast excretion on delayed images. Bilateral renal artery calcified atherosclerosis. Nonspecific perinephric stranding. No suspicious renal lesion. Mildly distended and otherwise unremarkable urinary bladder. Stomach/Bowel: Negative rectum. Negative sigmoid colon aside from retained stool. Negative left colon and transverse colon aside from retained stool. Small volume of fluid in the right gutter with mild right colonic wall thickening. The appendix appears stable. Oral contrast has just reached the ileocecal valve. No dilated small bowel. Decompressed stomach. Negative duodenum. Small volume upper abdominal fluid including along the inferior liver margin and about the spleen. No pneumoperitoneum. Vascular/Lymphatic: Extensive calcified atherosclerosis throughout the abdomen and pelvis including the aorta, and iliac, and femoral arteries suboptimal intravascular contrast bolus such that vascular patency is not well evaluated. There is a 2.6 cm hematoma or pseudoaneurysm at the bifurcation of the right common femoral artery as seen on series 2, image 128 which is new since 07/07/2016. There is  also a 3 cm simple fluid density collection superficial to the right common  femoral artery now (series 2, image 118). There were postoperative changes with a small volume of soft tissue gas at both of these locations on the prior study. No lymphadenopathy in the abdomen or pelvis. Reproductive: Negative. Other: Small volume simple density pelvic free fluid. Musculoskeletal: Mild grade 1 anterolisthesis of L4 on L5. No acute osseous abnormality identified. IMPRESSION: 1. There is a 2.6 cm hematoma versus pseudoaneurysm at the bifurcation of the right common femoral artery in the right inguinal region which is new compared to 07/07/2016. Recommend further evaluation with vascular ultrasound. Superimposed superficial 3 cm probable liquefying hematoma superficial to the right CFA. 2. Small bilateral layering pleural effusions. Nonspecific small volume of free fluid in the abdomen and pelvis. 3. Combination of pulmonary septal thickening with mild right upper lobe nodularity, lingula and bilateral lower lobe dependent and peribronchial opacity. Differential considerations include Pulmonary Edema with atelectasis versus Viral/atypical Respiratory Infection. 4. Underlying remote thoracic granulomatous disease. Superimposed reactive appearing mediastinal lymphadenopathy felt related to #3. 5. Up to mild wall thickening of the right colon could be reactive due to the nearby free fluid in the right gutter or reflect mild focal colitis. No other bowel inflammation. 6. Cardiomegaly and extensive calcified atherosclerosis throughout the chest abdomen and pelvis. Suboptimal intravascular contrast bolus such that vascular patency is not well evaluated on this study. Electronically Signed   By: Odessa Fleming M.D.   On: 07/30/2016 14:37   Mr Brain Wo Contrast  Result Date: 07/30/2016 CLINICAL DATA:  Initial evaluation for acute confusion. EXAM: MRI HEAD WITHOUT CONTRAST TECHNIQUE: Multiplanar, multiecho pulse sequences of the brain and surrounding structures were obtained without intravenous contrast.  COMPARISON:  Comparison made with prior CT from 07/11/2016 as well as recent MRI from 06/26/2016. FINDINGS: Brain: Study fairly advanced by motion artifact. Generalized age-related cerebral atrophy again noted. Mild for age chronic microvascular ischemic changes present within the periventricular and deep white matter both cerebral hemispheres. Multiple remote lacunar infarcts seen within the bilateral thalami. Remote infarct noted within the inferior left cerebellar hemisphere. There is 13 mm focus of abnormal restricted diffusion at the central aspect of the splenium of the corpus callosum (series 4, image 28). Slight extension to the left. Additional punctate 5 mm focus of diffusion abnormality at the left globus pallidus (series 4, image 27). Additional tiny focus of diffusion abnormality along the body of the corpus callosum (series 4, image 28). Few additional punctate foci of diffusion abnormality within mean periventricular cerebral white matter (series 4, image 26 on the right, series 7, image 24 on the left). Findings are suspected to be ischemic in nature, likely embolic given the various vascular distributions involved. While other entities including seizure and toxic metabolic derangement are associated with cytotoxic lesions of the splenium, the presence of the additional foci would perhaps favor an ischemic etiology. No associated hemorrhage or mass effect. No other evidence for acute or subacute ischemia. Gray-white matter differentiation otherwise grossly maintained. No evidence for acute or chronic intracranial hemorrhage. No mass lesion, midline shift or mass effect. Ventricles normal size without evidence for hydrocephalus. No extra-axial fluid collection. Major dural sinuses are grossly patent. Pituitary gland grossly unremarkable. Vascular: Major intracranial vascular flow voids are grossly maintained. Skull and upper cervical spine: Craniocervical junction unremarkable. Visualized upper  cervical spine grossly unremarkable. Bone marrow signal intensity within normal limits. No scalp soft tissue abnormality. Sinuses/Orbits: Globes and orbital soft tissues grossly unremarkable.  Paranasal sinuses are largely clear. No mastoid effusion. Other: None. IMPRESSION: 1. Patchy abnormal restricted diffusion involving the splenium/corpus callosum, left basal ganglia, and periventricular white matter as above. Findings favored to be ischemic in nature, likely embolic. While other entities including seizure and toxic metabolic derangement are also associated with cytotoxic lesions of the splenium, the presence of the additional foci would perhaps favor an ischemic etiology. 2. Otherwise stable grossly stable appearance of the brain on this motion degraded exam. Stable atrophy with mild chronic small vessel ischemic disease. Remote bilateral thalamic lacunar infarcts, with remote left cerebellar infarct. Electronically Signed   By: Rise Mu M.D.   On: 07/30/2016 15:26   Ct Abdomen Pelvis W Contrast  Result Date: 07/30/2016 CLINICAL DATA:  61 year old male with fever, hypotension, sepsis. End-stage renal disease on dialysis. Status post right femoral vascular surgery earlier this month. EXAM: CT CHEST, ABDOMEN, AND PELVIS WITH CONTRAST TECHNIQUE: Multidetector CT imaging of the chest, abdomen and pelvis was performed following the standard protocol during bolus administration of intravenous contrast. CONTRAST:  ISOVUE-300 IOPAMIDOL (ISOVUE-300) INJECTION 61% COMPARISON:  Pelvis CT with contrast 07/07/2016. Chest radiographs 2016/08/02 and earlier. FINDINGS: CT CHEST FINDINGS Cardiovascular: Mild cardiomegaly. Calcified coronary artery and aortic atherosclerosis. Left subclavian vascular stent. Suboptimal intravascular contrast bolus, such that great vessel patency is not well evaluated. Mediastinum/Nodes: There are number of small calcified mediastinal and hilar lymph nodes. Superimposed  larger anterior carina lymph nodes up to 17 mm short axis. Lungs/Pleura: Major airways are patent aside from atelectatic changes at both hila. Mild diffuse pulmonary septal thickening. Intermittent right upper lobe subpleural and centrilobular nodularity is superimposed. No similar nodularity in the left lower lobe. Confluent mostly dependent opacity in the lingula and both lower lobes. Superimposed small layering pleural effusions. Superimposed calcified granuloma in the right lower lobe. Musculoskeletal: Prior sternotomy. No acute osseous abnormality identified. CT ABDOMEN PELVIS FINDINGS Hepatobiliary: Small volume nonspecific perihepatic fluid. Negative liver. No definite gallbladder inflammation. No biliary ductal enlargement identified. Pancreas: Negative. Spleen: Negative except for a small volume of perisplenic fluid. Adrenals/Urinary Tract: Normal adrenal glands. Poor bilateral renal enhancement and no significant renal contrast excretion on delayed images. Bilateral renal artery calcified atherosclerosis. Nonspecific perinephric stranding. No suspicious renal lesion. Mildly distended and otherwise unremarkable urinary bladder. Stomach/Bowel: Negative rectum. Negative sigmoid colon aside from retained stool. Negative left colon and transverse colon aside from retained stool. Small volume of fluid in the right gutter with mild right colonic wall thickening. The appendix appears stable. Oral contrast has just reached the ileocecal valve. No dilated small bowel. Decompressed stomach. Negative duodenum. Small volume upper abdominal fluid including along the inferior liver margin and about the spleen. No pneumoperitoneum. Vascular/Lymphatic: Extensive calcified atherosclerosis throughout the abdomen and pelvis including the aorta, and iliac, and femoral arteries suboptimal intravascular contrast bolus such that vascular patency is not well evaluated. There is a 2.6 cm hematoma or pseudoaneurysm at the  bifurcation of the right common femoral artery as seen on series 2, image 128 which is new since 07/07/2016. There is also a 3 cm simple fluid density collection superficial to the right common femoral artery now (series 2, image 118). There were postoperative changes with a small volume of soft tissue gas at both of these locations on the prior study. No lymphadenopathy in the abdomen or pelvis. Reproductive: Negative. Other: Small volume simple density pelvic free fluid. Musculoskeletal: Mild grade 1 anterolisthesis of L4 on L5. No acute osseous abnormality identified. IMPRESSION: 1. There is a  2.6 cm hematoma versus pseudoaneurysm at the bifurcation of the right common femoral artery in the right inguinal region which is new compared to 07/07/2016. Recommend further evaluation with vascular ultrasound. Superimposed superficial 3 cm probable liquefying hematoma superficial to the right CFA. 2. Small bilateral layering pleural effusions. Nonspecific small volume of free fluid in the abdomen and pelvis. 3. Combination of pulmonary septal thickening with mild right upper lobe nodularity, lingula and bilateral lower lobe dependent and peribronchial opacity. Differential considerations include Pulmonary Edema with atelectasis versus Viral/atypical Respiratory Infection. 4. Underlying remote thoracic granulomatous disease. Superimposed reactive appearing mediastinal lymphadenopathy felt related to #3. 5. Up to mild wall thickening of the right colon could be reactive due to the nearby free fluid in the right gutter or reflect mild focal colitis. No other bowel inflammation. 6. Cardiomegaly and extensive calcified atherosclerosis throughout the chest abdomen and pelvis. Suboptimal intravascular contrast bolus such that vascular patency is not well evaluated on this study. Electronically Signed   By: Odessa Fleming M.D.   On: 07/30/2016 14:37   US Carotid Bilateral  Result Date: 07/31/2016 CLINICAL DATA:  Cerebral infarction.  History of hypertension, coronary artery disease and diabetes. EXAM: BILATERAL CAROTID DUPLEX ULTRASOUND TECHNIQUE: Wallace Cullens scale imaging, color Doppler and duplex ultrasound were performed of bilateral carotid and vertebral arteries in the neck. COMPARISON:  None. FINDINGS: Criteria: Quantification of carotid stenosis is based on velocity parameters that correlate the residual internal carotid diameter with NASCET-based stenosis levels, using the diameter of the distal internal carotid lumen as the denominator for stenosis measurement. The following velocity measurements were obtained: RIGHT ICA:  145/39 cm/sec CCA:  69/7 cm/sec SYSTOLIC ICA/CCA RATIO:  2.1 DIASTOLIC ICA/CCA RATIO:  5.6 ECA:  127 cm/sec LEFT ICA:  122/29 cm/sec CCA:  63/10 cm/sec SYSTOLIC ICA/CCA RATIO:  1.9 DIASTOLIC ICA/CCA RATIO:  2.9 ECA:  157 cm/sec RIGHT CAROTID ARTERY: There is a fairly mild amounts of partially calcified plaque present at the level of the proximal right ICA. Velocities do show some elevation, however and no tortuosity is present. Strict categorization of estimated right ICA stenosis is 50- 69%. Stenosis is likely closer to 50% based on grayscale appearance. RIGHT VERTEBRAL ARTERY: Antegrade flow with normal waveform and velocity. LEFT CAROTID ARTERY: Minimal plaque is present in the proximal left ICA. Estimated left ICA stenosis is less than 50%. LEFT VERTEBRAL ARTERY: Antegrade flow with normal waveform and velocity. IMPRESSION: Mild amount of plaque in both proximal internal carotid arteries. Due to some velocity elevation, categorization of right ICA stenosis is 50- 69% (stenosis likely closer to 50%). Estimated left ICA stenosis is less than 50%. Electronically Signed   By: Irish Lack M.D.   On: 07/31/2016 10:48   [place] Lower Ext Art Right Ltd  Result Date: 07/31/2016 CLINICAL DATA:  61 year old male with a history of right common femoral artery hematoma. EXAM: UNILATERAL RIGHT LOWER EXTREMITY ARTERIAL DUPLEX SCAN  TECHNIQUE: Gray-scale sonography as well as color Doppler and duplex ultrasound was performed to evaluate the right lower extremity COMPARISON:  CT 07/30/2016 FINDINGS: Grayscale and color directed duplex imaging of the right lower extremity for evaluation of right inguinal hematoma. Ill-defined anechoic fluid collection in the right inguinal region measuring 4.7 cm x 1.8 cm x 3.2 cm. This overlies the femoral vasculature. Duplex imaging demonstrates swirling blood flow communicating with the fluid collection adjacent to the common femoral artery with neck measuring 4 mm at the common femoral artery. IMPRESSION: Directed duplex of the right inguinal region demonstrates evidence  of a pseudoaneurysm from the common femoral artery with adjacent hematoma. Signed, Yvone NeuJaime S. Loreta AveWagner, DO [facility] Electronically Signed   By: Gilmer MorJaime  Wagner D.O.   On: 07/31/2016 10:57    EKG:  Orders placed or performed during the hospital encounter of 07/18/2016  . ED EKG 12-Lead  . ED EKG 12-Lead  . EKG 12-Lead  . EKG 12-Lead    ASSESSMENT AND PLAN:  Active Problems:   Encephalopathy acute   ESRD (end stage renal disease) (HCC)   Palliative care encounter  #1. Acute embolic stroke predominantly left hemisphere, also underlying encephalopathy, due to infection, fever, hypoglycemia, appreciate neurologist input, echocardiogram revealed ejection fraction of 30-35%, carotid ultrasound showed the right ICA stenosis to 50-69%, less than 50% on the left, continue patient on Coumadin therapy, however administration of Coumadin will be difficult due to dysphagia. Palliative care to discuss goals of care. Follow patient's progression. #2. Dysphagia, patient is nothing by mouth at present, speech therapist to evaluate him and decide about oral feeds. Continue dextrose solution intravenously, unable to give a high rate IV fluids due to and seizure-like disease and concerns of  fluid overload #3.  sepsis, hypotension, hypoglycemia, continue Zosyn therapy,  now off vancomycin, blood cultures are negative , MRSA PCR is negative,  podiatrist felt that patient's wound is healing nicely, not a site for infection  #4. Ascending colon colitis, continue Zosyn,  follow clinically #5 hypotension, suspended blood pressure medications, blood pressure  stable at present ,  suspected due to anemia and infection, questionable continuous blood loss #6, hypoglycemia in diabetic, hemoglobin A1c 6.0, continue dextrose solution, unable to continue diet due to dysphagia/altered mental state #7. End-stage renal disease, hemodialysis per nephrologist, discussed with Dr. Thedore MinsSingh #8. Anemia, suspected acute posthemorrhagic anemia due to right inguinal region hematoma, as noted on CT scan today,   status post 2 units of packed red blood cell transfusion,stable hemoglobin level , patient was on aspirin and Coumadin at home, Coumadin was supratherapeutic, status post fresh frozen plasma transfusion during this admission now off aspirin and Coumadin due to dysphagia, follow closely. Discussed with Dr. Wyn Quakerew  yesterday. Arterial ultrasound failed to revealed a pseudoaneurysm. #9 coagulopathy due to Coumadin, transfused fresh frozen plasma, follow pro time INR after transfusion , some better today   . #10 suspected hematoma versus pseudoaneurysm at the bifurcation of right common femoral artery, vascular ultrasound showed no pseudoaneurysm. Vascular surgeon, saw patient in consultation and felt that hematoma in the right groin was small and would not explain significant anemia requiring 2 units of packed red blood cell transfusion, it did not appear infected. Vascular surgeon recommended to continue antiplatelet therapy. Since patient's peripheral artery disease is very bad, but now we are not able to continue even aspirin due to patient's dysphagia/altered mental status   Magement plans discussed with the  patient, family and they are in agreement.   DRUG ALLERGIES: No Known Allergies  CODE STATUS:     Code Status Orders        Start     Ordered   07/18/2016 1331  Full code  Continuous     07/21/2016 1331    Code Status History    Date Active Date Inactive Code Status Order ID Comments User Context   07/20/2016 10:44 AM 07/24/2016  9:34 PM Full Code [id number]  Ramonita LabGouru, Aruna, MD Inpatient   06/24/2016  2:41 PM 07/08/2016  6:16 PM Full Code [id number]  [place] SirenSainani, Vivek J, MD  Inpatient   01/28/2016 11:52 AM 01/28/2016  4:07 PM Full Code [id number]  Schnier, Latina Craver, MD Inpatient      TOTAL Critical care TIME TAKING CARE OF THIS PATIENT: 60 minutes.   discussed with multiple family members via [place] interpreter, discussed with Dr. Lorelee Cover M.D on 07/31/2016 at 2:52 PM  Between 7am to 6pm - Pager - [phone]  After 6pm go to [email or web address] - password [email or web address]  [facility]  Office  [phone]  CC: Primary care physician; System, Pcp Not In

## 2016-07-31 NOTE — Consult Note (Signed)
Cardiology Consult    Patient ID: Eddie Myers MRN: [id number], DOB/AGE: 1955/08/26   Admit date: 2016/08/17 Date of Consult: 07/31/2016  Primary Physician: System, Pcp Not In Primary Cardiologist: Wallace Cullens, MD  Requesting Provider: Lenetta Quaker, MD  Patient Profile    Eddie Myers is a 61 y.o. male with a history of CAD s/p CABG, ICM/HFrEF, HTN, HL, CVA, ESRD on HD, PVD/gangrene s/p bilat LE PTAs, who is being seen today for the evaluation of LV dysfunction at the request of Dr. Winona Legato.  Past Medical History   Past Medical History:  Diagnosis Date  . Chronic systolic CHF (congestive heart failure) (HCC)    a. 08/2015 Echo: EF 30-35%;  b. 11/2015 Echo: EF 60-65%; c. 06/2016 Echo: Ef 30-35%.  . Coronary artery disease    a. 01/2014 CABG x 2: LIMA->LAD, VG->OM; b. 08/2015 NSTEMI in [place]-->med Rx.  . Diabetes mellitus without complication (HCC)   . Elevated lipids   . Enlarged prostate   . ESRD (end stage renal disease) (HCC)    a. TTS dialysis since 08/2016.  [name] Hypertension   . Ischemic cardiomyopathy    a. 08/2015 Echo: EF 30-35%;  b. 11/2015 Echo: EF 60-65%; c. 06/2016 Echo: Ef 30-35%, restrictive phys, mildly dil LA/RV, mild to mod RA dil, mild to mod TR.  [name] PVD (peripheral vascular disease) (HCC)    a. 05/2016 complicated by gangrene of toes-->s/p PTA of L SFA and L popliteal w/ 5mm Lutonix DEB;  b. 06/2016 Endarterctomy and patch angioplasty to RCFA, RSFA, Profunda, and R PT & distal RSFA.  . Stroke [facility])    a. 08/2015 MRI brain showed very small distal bilat embolic appearing CVAs ([place]);  b. 05/2016 subacute R vision loss->CT head showed R Thalamic CVA.    Past Surgical History:  Procedure Laterality Date  . ABDOMINAL AORTOGRAM W/LOWER EXTREMITY Left 06/26/2016   Procedure: Abdominal Aortogram w/Lower Extremity;  Surgeon: Renford Dills, MD;  Location: [facility] INVASIVE CV LAB;  Service: Cardiovascular;  Laterality: Left;  . AMPUTATION TOE Bilateral 07/22/2016   Procedure:  AMPUTATION TOE LEFT GREAT TOE AND RIGHT 2ND TOE;  Surgeon: Linus Galas, DPM;  Location: [facility] ORS;  Service: Podiatry;  Laterality: Bilateral;  . AV FISTULA PLACEMENT Left 01/10/2016   Procedure: INSERTION OF ARTERIOVENOUS (AV) GORE-TEX GRAFT ARM ( BRACH / AXILLARY GRAFT );  Surgeon: Renford Dills, MD;  Location: [facility] ORS;  Service: Vascular;  Laterality: Left;  . CARDIAC CATHETERIZATION    . CORONARY ARTERY BYPASS GRAFT  01/2014   [facility] (LIMA -> LAD and SVG -> OM)  . INSERTION OF DIALYSIS CATHETER Right    Perma catheter  . LOWER EXTREMITY ANGIOGRAPHY  07/03/2016   Procedure: Lower Extremity Angiography;  Surgeon: Renford Dills, MD;  Location: [facility] INVASIVE CV LAB;  Service: Cardiovascular;;  . LOWER EXTREMITY INTERVENTION  07/03/2016   Procedure: Lower Extremity Intervention;  Surgeon: Renford Dills, MD;  Location: [facility] INVASIVE CV LAB;  Service: Cardiovascular;;  . PERIPHERAL VASCULAR BALLOON ANGIOPLASTY Left 07/01/2016   Procedure: Peripheral Vascular Balloon Angioplasty;  Surgeon: Renford Dills, MD;  Location: [facility] INVASIVE CV LAB;  Service: Cardiovascular;  Laterality: Left;  . PERIPHERAL VASCULAR BALLOON ANGIOPLASTY N/A 07/03/2016   Procedure: Peripheral Vascular Balloon Angioplasty;  Surgeon: Renford Dills, MD;  Location: [facility] INVASIVE CV LAB;  Service: Cardiovascular;  Laterality: N/A;  . PERIPHERAL VASCULAR CATHETERIZATION N/A 01/17/2016   Procedure: Thrombectomy;  Surgeon: Renford Dills, MD;  Location: [facility] INVASIVE CV  LAB;  Service: Cardiovascular;  Laterality: N/A;  . PERIPHERAL VASCULAR CATHETERIZATION Left 01/28/2016   Procedure: A/V Fistulagram;  Surgeon: Renford DillsGregory G Schnier, MD;  Location: [facility] INVASIVE CV LAB;  Service: Cardiovascular;  Laterality: Left;  . PERIPHERAL VASCULAR CATHETERIZATION N/A 03/24/2016   Procedure: Dialysis/Perma Catheter Removal;  Surgeon: Renford DillsGregory G Schnier, MD;  Location: [facility] INVASIVE CV LAB;  Service: Cardiovascular;  Laterality:  N/A;  . UPPER EXTREMITY ANGIOGRAM Left 01/28/2016   Procedure: Upper Extremity Angiogram;  Surgeon: Renford DillsGregory G Schnier, MD;  Location: [facility] INVASIVE CV LAB;  Service: Cardiovascular;  Laterality: Left;     Allergies  No Known Allergies  History of Present Illness    61 year old male with the above complex past medical history including coronary artery disease status [facility] He also has history of ischemic cardiomyopathy myopathy and HFrEF variable EF. Other history includes hypertension, hyperlipidemia, stroke, end-stage renal disease on hemodialysis, and peripheral vascular disease. Patient currently suffers from altered mental status and is unable to provide any history. His daughter is at his bedside and assisting. Review of records shows prior history of CAD status post CABG in 2015. In 2017, he suffered a non-STEMI and was hospitalized in [place]. Hospitalization was complicated by hypertensive urgency and also presumed embolic stroke. Patient did not undergo further cardiac evaluation secondary to stroke. An echo showed an EF 30-35%. He later establish care with Dr. Okey DupreEnd.  An echo in September 2017 showed normalization of LV function with an EF of 60-65%. He was recently evaluated in cardiology clinic in late April and was noted to have gangrenous toes. He also reported loss of vision in his right eye occurring a few weeks earlier. He was admitted to the hospital. He seen by vascular surgery and underwent urgent left lower extremity angioplasty as outlined in the past medical history. He subsequently underwent patch angioplasty of the right lower extremity. CT and MRI did show subacute right thalamic stroke. [facility] subsequently [facility]. On May 29, he was noted to have alteration in mental status and fever. He was transferred to [facility] where he was treated for sepsis. He was also noted to be anemic in the setting of right inguinal  hematoma and received 2 units of packed red blood cells. He had been on Coumadin at home and that was placed on hold. Patient was monitored in the intensive care unit and subsequently transferred to the floor. He continues to have alteration in mental status. An echocardiogram was performed showing EF of 30-35% and we have been asked to evaluate. MRI of the brain on 5/31 showed patchy abnormal restricted diffusion involving the splenium/corpus callosum, left basal ganglia, and periventricular white matter. Findings favored to be ischemic in nature, likely embolic.  Per his daughter, he has not complained of any recent chest pain or dyspnea.   Current Medications     . aspirin EC  81 mg Oral Daily  . insulin aspart  0-9 Units Subcutaneous Q4H  . polyethylene glycol  17 g Oral Daily  . sodium chloride flush  3 mL Intravenous Q12H    Family History - obtained from Epic - pt not able to provide meaningful history.    Family History  Problem Relation Age of Onset  . Cancer Mother   . Diabetes Mother   . Diabetes Father     Social History - obtained from Epic - pt not able to provide meaningful history.    Social History  Social History  . Marital status: Single    Spouse name: N/A  . Number of children: N/A  . Years of education: N/A   Occupational History  . Not on file.   Social History Main Topics  . Smoking status: Former Smoker    Packs/day: 0.50    Years: 45.00    Quit date: 01/05/2015  . Smokeless tobacco: Never Used  . Alcohol use No  . Drug use: No  . Sexual activity: Not on file   Other Topics Concern  . Not on file   Social History Narrative  . No narrative on file     Review of Systems    Due to AMS, pt is unable to answer questions related to ROS.  Physical Exam    Blood pressure (!) 115/55, pulse 79, temperature 98.2 F (36.8 C), temperature source Oral, resp. rate 18, height 5\' 8"  (1.727 m), weight 178 lb 9.2 oz (81 kg), SpO2 100 %.  General:  Pleasant, NAD.  Does not follow commands.  Will answer some questions from his daughter (only speaks spanish). Psych: flat affect - opens eyes to name. Neuro: Unable to ascertain orientation.  Per dtr, he does not know that he is in the hospital. Moves all extremities spontaneously. HEENT: Normal  Neck: Supple without bruits or JVD. Lungs:  Resp regular and unlabored, CTA. Heart: RRR no s3, s4, or murmurs. Abdomen: Soft, non-tender, non-distended, BS + x 4.  Extremities: No clubbing, cyanosis or edema. LUE AVF + bruit/thrill. DP/PT/Radials 2+ and equal bilaterally.  Labs     Recent Labs  07/07/2016 1552 07/01/2016 2146  TROPONINI 0.53* 0.45*   Lab Results  Component Value Date   WBC 6.1 07/31/2016   HGB 8.3 (L) 07/31/2016   HCT 24.6 (L) 07/31/2016   MCV 87.4 07/31/2016   PLT 213 07/31/2016     Recent Labs Lab 07/29/16 0351 07/30/16 0325  NA 130* 135  K 4.6 4.4  CL 91* 93*  CO2 26 32  BUN 53* 27*  CREATININE 9.25* 5.71*  CALCIUM 8.0* 7.9*  PROT 6.5  --   BILITOT 1.0  --   ALKPHOS 230*  --   ALT 18  --   AST 28  --   GLUCOSE 88 110*    Radiology Studies    Ct Head Wo Contrast  Result Date: 07/01/2016 CLINICAL DATA:  Altered mental status.  Unresponsive. EXAM: CT HEAD WITHOUT CONTRAST TECHNIQUE: Contiguous axial images were obtained from the base of the skull through the vertex without intravenous contrast. COMPARISON:  Brain MRI 06/26/2016 FINDINGS: Brain: There is no evidence of acute infarct, intracranial hemorrhage, mass, midline shift, or extra-axial fluid collection. The ventricles and sulci are within normal limits for age. Cerebral white matter hypodensities are unchanged and nonspecific but compatible with mild chronic small vessel ischemic disease. Chronic lacunar infarcts are again seen in the posterior limb of the right internal capsule, bilateral thalami, and left cerebellum. Punctate superficial cortical or extra-axial calcifications are noted in the right  parietal and left frontal lobes Vascular: Calcified atherosclerosis at the skullbase. No hyperdense vessel. Skull: No fracture or focal osseous lesion. Sinuses/Orbits: Visualized paranasal sinuses and mastoid air cells are clear. Orbits are unremarkable. Other: None. IMPRESSION: 1. No evidence of acute intracranial abnormality. 2. Chronic small vessel ischemia with chronic lacunar infarcts as above. Electronically Signed   By: Sebastian Ache M.D.   On: 07/30/2016 12:45   Ct Chest W Contrast  Result Date: 07/30/2016 CLINICAL DATA:  61 year old male with fever, hypotension, sepsis. End-stage renal disease on dialysis. Status post right femoral vascular surgery earlier this month. EXAM: CT CHEST, ABDOMEN, AND PELVIS WITH CONTRAST TECHNIQUE: Multidetector CT imaging of the chest, abdomen and pelvis was performed following the standard protocol during bolus administration of intravenous contrast. CONTRAST:  ISOVUE-300 IOPAMIDOL (ISOVUE-300) INJECTION 61% COMPARISON:  Pelvis CT with contrast 07/07/2016. Chest radiographs 07/02/2016 and earlier. FINDINGS: CT CHEST FINDINGS Cardiovascular: Mild cardiomegaly. Calcified coronary artery and aortic atherosclerosis. Left subclavian vascular stent. Suboptimal intravascular contrast bolus, such that great vessel patency is not well evaluated. Mediastinum/Nodes: There are number of small calcified mediastinal and hilar lymph nodes. Superimposed larger anterior carina lymph nodes up to 17 mm short axis. Lungs/Pleura: Major airways are patent aside from atelectatic changes at both hila. Mild diffuse pulmonary septal thickening. Intermittent right upper lobe subpleural and centrilobular nodularity is superimposed. No similar nodularity in the left lower lobe. Confluent mostly dependent opacity in the lingula and both lower lobes. Superimposed small layering pleural effusions. Superimposed calcified granuloma in the right lower lobe. Musculoskeletal: Prior sternotomy. No acute  osseous abnormality identified. CT ABDOMEN PELVIS FINDINGS Hepatobiliary: Small volume nonspecific perihepatic fluid. Negative liver. No definite gallbladder inflammation. No biliary ductal enlargement identified. Pancreas: Negative. Spleen: Negative except for a small volume of perisplenic fluid. Adrenals/Urinary Tract: Normal adrenal glands. Poor bilateral renal enhancement and no significant renal contrast excretion on delayed images. Bilateral renal artery calcified atherosclerosis. Nonspecific perinephric stranding. No suspicious renal lesion. Mildly distended and otherwise unremarkable urinary bladder. Stomach/Bowel: Negative rectum. Negative sigmoid colon aside from retained stool. Negative left colon and transverse colon aside from retained stool. Small volume of fluid in the right gutter with mild right colonic wall thickening. The appendix appears stable. Oral contrast has just reached the ileocecal valve. No dilated small bowel. Decompressed stomach. Negative duodenum. Small volume upper abdominal fluid including along the inferior liver margin and about the spleen. No pneumoperitoneum. Vascular/Lymphatic: Extensive calcified atherosclerosis throughout the abdomen and pelvis including the aorta, and iliac, and femoral arteries suboptimal intravascular contrast bolus such that vascular patency is not well evaluated. There is a 2.6 cm hematoma or pseudoaneurysm at the bifurcation of the right common femoral artery as seen on series 2, image 128 which is new since 07/07/2016. There is also a 3 cm simple fluid density collection superficial to the right common femoral artery now (series 2, image 118). There were postoperative changes with a small volume of soft tissue gas at both of these locations on the prior study. No lymphadenopathy in the abdomen or pelvis. Reproductive: Negative. Other: Small volume simple density pelvic free fluid. Musculoskeletal: Mild grade 1 anterolisthesis of L4 on L5. No acute  osseous abnormality identified. IMPRESSION: 1. There is a 2.6 cm hematoma versus pseudoaneurysm at the bifurcation of the right common femoral artery in the right inguinal region which is new compared to 07/07/2016. Recommend further evaluation with vascular ultrasound. Superimposed superficial 3 cm probable liquefying hematoma superficial to the right CFA. 2. Small bilateral layering pleural effusions. Nonspecific small volume of free fluid in the abdomen and pelvis. 3. Combination of pulmonary septal thickening with mild right upper lobe nodularity, lingula and bilateral lower lobe dependent and peribronchial opacity. Differential considerations include Pulmonary Edema with atelectasis versus Viral/atypical Respiratory Infection. 4. Underlying remote thoracic granulomatous disease. Superimposed reactive appearing mediastinal lymphadenopathy felt related to #3. 5. Up to mild wall thickening of the right colon could be reactive due to the nearby free fluid in the right  gutter or reflect mild focal colitis. No other bowel inflammation. 6. Cardiomegaly and extensive calcified atherosclerosis throughout the chest abdomen and pelvis. Suboptimal intravascular contrast bolus such that vascular patency is not well evaluated on this study. Electronically Signed   By: Odessa Fleming M.D.   On: 07/30/2016 14:37   Ct Pelvis W Contrast  Result Date: 07/07/2016 CLINICAL DATA:  Severe right groin pain. Drop in hematocrit and hemoglobin. Status post right common femoral, profunda femoris and superficial femoral artery endarterectomies with separate core matrix patch angioplasty of the common and superficial femoral arteries and second patchy angioplasty of the profunda femoris artery on 07/03/2016. Additional angioplasties of the right popliteal and right posterior tibial arteries. EXAM: CT PELVIS AND BILATERAL LOWER EXTREMITIES WITH CONTRAST TECHNIQUE: Multidetector CT imaging of the pelvis and bilateral lower extremities was  performed according to the standard protocol following bolus administration of intravenous contrast. Multiplanar CT image reconstructions were also generated. CONTRAST:  ISOVUE-300 IOPAMIDOL (ISOVUE-300) INJECTION 61% COMPARISON:  None. FINDINGS: CT PELVIS FINDINGS Urinary Tract:  Unremarkable urinary bladder and distal ureters. Bowel:  Unremarkable. Vascular/Lymphatic: Atheromatous arterial calcifications and plaques. Reproductive:  Normal sized prostate gland. Other: Small bilateral inguinal hernias containing fat. Mild right groin. Vascular and subcutaneous edema and postoperative air, extending superiorly and inferiorly. No discrete abscess or hematoma seen. Musculoskeletal: Mild lower lumbar spine degenerative changes. Left iliac bone cyst. CT LEFT/RIGHT LOWER EXTREMITY FINDINGS Bones/Joint/Cartilage Unremarkable. Ligaments Suboptimally assessed by CT. Muscles and Tendons Mild low density in the lateral aspect of the proximal adductor longus muscle on the right. No significant enlargement of the masses Soft tissues Mild diffuse subcutaneous edema anteriorly, laterally and medially in the proximal right thigh. Vascular: Multifocal arterial atheromatous calcifications and plaques. IMPRESSION: 1. Right groin postoperative changes with mild associated soft tissue edema and air, including mild edema in the lateral aspect of the proximal adductor longus muscle on the right. No visible hematoma. 2. Extensive bilateral arterial atheromatous changes. 3. Small bilateral inguinal hernias containing fat. Electronically Signed   By: Beckie Salts M.D.   On: 07/07/2016 13:28   Mr Brain Wo Contrast  Result Date: 07/30/2016 CLINICAL DATA:  Initial evaluation for acute confusion. EXAM: MRI HEAD WITHOUT CONTRAST TECHNIQUE: Multiplanar, multiecho pulse sequences of the brain and surrounding structures were obtained without intravenous contrast. COMPARISON:  Comparison made with prior CT from 07/17/2016 as well as recent  MRI from 06/26/2016. FINDINGS: Brain: Study fairly advanced by motion artifact. Generalized age-related cerebral atrophy again noted. Mild for age chronic microvascular ischemic changes present within the periventricular and deep white matter both cerebral hemispheres. Multiple remote lacunar infarcts seen within the bilateral thalami. Remote infarct noted within the inferior left cerebellar hemisphere. There is 13 mm focus of abnormal restricted diffusion at the central aspect of the splenium of the corpus callosum (series 4, image 28). Slight extension to the left. Additional punctate 5 mm focus of diffusion abnormality at the left globus pallidus (series 4, image 27). Additional tiny focus of diffusion abnormality along the body of the corpus callosum (series 4, image 28). Few additional punctate foci of diffusion abnormality within mean periventricular cerebral white matter (series 4, image 26 on the right, series 7, image 24 on the left). Findings are suspected to be ischemic in nature, likely embolic given the various vascular distributions involved. While other entities including seizure and toxic metabolic derangement are associated with cytotoxic lesions of the splenium, the presence of the additional foci would perhaps favor an ischemic etiology. No associated  hemorrhage or mass effect. No other evidence for acute or subacute ischemia. Gray-white matter differentiation otherwise grossly maintained. No evidence for acute or chronic intracranial hemorrhage. No mass lesion, midline shift or mass effect. Ventricles normal size without evidence for hydrocephalus. No extra-axial fluid collection. Major dural sinuses are grossly patent. Pituitary gland grossly unremarkable. Vascular: Major intracranial vascular flow voids are grossly maintained. Skull and upper cervical spine: Craniocervical junction unremarkable. Visualized upper cervical spine grossly unremarkable. Bone marrow signal intensity within normal  limits. No scalp soft tissue abnormality. Sinuses/Orbits: Globes and orbital soft tissues grossly unremarkable. Paranasal sinuses are largely clear. No mastoid effusion. Other: None. IMPRESSION: 1. Patchy abnormal restricted diffusion involving the splenium/corpus callosum, left basal ganglia, and periventricular white matter as above. Findings favored to be ischemic in nature, likely embolic. While other entities including seizure and toxic metabolic derangement are also associated with cytotoxic lesions of the splenium, the presence of the additional foci would perhaps favor an ischemic etiology. 2. Otherwise stable grossly stable appearance of the brain on this motion degraded exam. Stable atrophy with mild chronic small vessel ischemic disease. Remote bilateral thalamic lacunar infarcts, with remote left cerebellar infarct. Electronically Signed   By: Rise Mu M.D.   On: 07/30/2016 15:26   Ct Abdomen Pelvis W Contrast  Result Date: 07/30/2016 CLINICAL DATA:  61 year old male with fever, hypotension, sepsis. End-stage renal disease on dialysis. Status post right femoral vascular surgery earlier this month. EXAM: CT CHEST, ABDOMEN, AND PELVIS WITH CONTRAST TECHNIQUE: Multidetector CT imaging of the chest, abdomen and pelvis was performed following the standard protocol during bolus administration of intravenous contrast. CONTRAST:  ISOVUE-300 IOPAMIDOL (ISOVUE-300) INJECTION 61% COMPARISON:  Pelvis CT with contrast 07/07/2016. Chest radiographs 07/13/2016 and earlier. FINDINGS: CT CHEST FINDINGS Cardiovascular: Mild cardiomegaly. Calcified coronary artery and aortic atherosclerosis. Left subclavian vascular stent. Suboptimal intravascular contrast bolus, such that great vessel patency is not well evaluated. Mediastinum/Nodes: There are number of small calcified mediastinal and hilar lymph nodes. Superimposed larger anterior carina lymph nodes up to 17 mm short axis. Lungs/Pleura: Major  airways are patent aside from atelectatic changes at both hila. Mild diffuse pulmonary septal thickening. Intermittent right upper lobe subpleural and centrilobular nodularity is superimposed. No similar nodularity in the left lower lobe. Confluent mostly dependent opacity in the lingula and both lower lobes. Superimposed small layering pleural effusions. Superimposed calcified granuloma in the right lower lobe. Musculoskeletal: Prior sternotomy. No acute osseous abnormality identified. CT ABDOMEN PELVIS FINDINGS Hepatobiliary: Small volume nonspecific perihepatic fluid. Negative liver. No definite gallbladder inflammation. No biliary ductal enlargement identified. Pancreas: Negative. Spleen: Negative except for a small volume of perisplenic fluid. Adrenals/Urinary Tract: Normal adrenal glands. Poor bilateral renal enhancement and no significant renal contrast excretion on delayed images. Bilateral renal artery calcified atherosclerosis. Nonspecific perinephric stranding. No suspicious renal lesion. Mildly distended and otherwise unremarkable urinary bladder. Stomach/Bowel: Negative rectum. Negative sigmoid colon aside from retained stool. Negative left colon and transverse colon aside from retained stool. Small volume of fluid in the right gutter with mild right colonic wall thickening. The appendix appears stable. Oral contrast has just reached the ileocecal valve. No dilated small bowel. Decompressed stomach. Negative duodenum. Small volume upper abdominal fluid including along the inferior liver margin and about the spleen. No pneumoperitoneum. Vascular/Lymphatic: Extensive calcified atherosclerosis throughout the abdomen and pelvis including the aorta, and iliac, and femoral arteries suboptimal intravascular contrast bolus such that vascular patency is not well evaluated. There is a 2.6 cm hematoma or pseudoaneurysm at the  bifurcation of the right common femoral artery as seen on series 2, image 128 which is  new since 07/07/2016. There is also a 3 cm simple fluid density collection superficial to the right common femoral artery now (series 2, image 118). There were postoperative changes with a small volume of soft tissue gas at both of these locations on the prior study. No lymphadenopathy in the abdomen or pelvis. Reproductive: Negative. Other: Small volume simple density pelvic free fluid. Musculoskeletal: Mild grade 1 anterolisthesis of L4 on L5. No acute osseous abnormality identified. IMPRESSION: 1. There is a 2.6 cm hematoma versus pseudoaneurysm at the bifurcation of the right common femoral artery in the right inguinal region which is new compared to 07/07/2016. Recommend further evaluation with vascular ultrasound. Superimposed superficial 3 cm probable liquefying hematoma superficial to the right CFA. 2. Small bilateral layering pleural effusions. Nonspecific small volume of free fluid in the abdomen and pelvis. 3. Combination of pulmonary septal thickening with mild right upper lobe nodularity, lingula and bilateral lower lobe dependent and peribronchial opacity. Differential considerations include Pulmonary Edema with atelectasis versus Viral/atypical Respiratory Infection. 4. Underlying remote thoracic granulomatous disease. Superimposed reactive appearing mediastinal lymphadenopathy felt related to #3. 5. Up to mild wall thickening of the right colon could be reactive due to the nearby free fluid in the right gutter or reflect mild focal colitis. No other bowel inflammation. 6. Cardiomegaly and extensive calcified atherosclerosis throughout the chest abdomen and pelvis. Suboptimal intravascular contrast bolus such that vascular patency is not well evaluated on this study. Electronically Signed   By: Odessa Fleming M.D.   On: 07/30/2016 14:37   US Carotid Bilateral  Result Date: 07/31/2016 CLINICAL DATA:  Cerebral infarction. History of hypertension, coronary artery disease and diabetes. EXAM: BILATERAL CAROTID  DUPLEX ULTRASOUND TECHNIQUE: Wallace Cullens scale imaging, color Doppler and duplex ultrasound were performed of bilateral carotid and vertebral arteries in the neck. COMPARISON:  None. FINDINGS: Criteria: Quantification of carotid stenosis is based on velocity parameters that correlate the residual internal carotid diameter with NASCET-based stenosis levels, using the diameter of the distal internal carotid lumen as the denominator for stenosis measurement. The following velocity measurements were obtained: RIGHT ICA:  145/39 cm/sec CCA:  69/7 cm/sec SYSTOLIC ICA/CCA RATIO:  2.1 DIASTOLIC ICA/CCA RATIO:  5.6 ECA:  127 cm/sec LEFT ICA:  122/29 cm/sec CCA:  63/10 cm/sec SYSTOLIC ICA/CCA RATIO:  1.9 DIASTOLIC ICA/CCA RATIO:  2.9 ECA:  157 cm/sec RIGHT CAROTID ARTERY: There is a fairly mild amounts of partially calcified plaque present at the level of the proximal right ICA. Velocities do show some elevation, however and no tortuosity is present. Strict categorization of estimated right ICA stenosis is 50- 69%. Stenosis is likely closer to 50% based on grayscale appearance. RIGHT VERTEBRAL ARTERY: Antegrade flow with normal waveform and velocity. LEFT CAROTID ARTERY: Minimal plaque is present in the proximal left ICA. Estimated left ICA stenosis is less than 50%. LEFT VERTEBRAL ARTERY: Antegrade flow with normal waveform and velocity. IMPRESSION: Mild amount of plaque in both proximal internal carotid arteries. Due to some velocity elevation, categorization of right ICA stenosis is 50- 69% (stenosis likely closer to 50%). Estimated left ICA stenosis is less than 50%. Electronically Signed   By: Irish Lack M.D.   On: 07/31/2016 10:48   [place] Lower Ext Art Right Ltd  Result Date: 07/31/2016 CLINICAL DATA:  62 year old male with a history of right common femoral artery hematoma. EXAM: UNILATERAL RIGHT LOWER EXTREMITY ARTERIAL DUPLEX SCAN TECHNIQUE: Gray-scale sonography  as well as color Doppler and duplex ultrasound was  performed to evaluate the right lower extremity COMPARISON:  CT 07/30/2016 FINDINGS: Grayscale and color directed duplex imaging of the right lower extremity for evaluation of right inguinal hematoma. Ill-defined anechoic fluid collection in the right inguinal region measuring 4.7 cm x 1.8 cm x 3.2 cm. This overlies the femoral vasculature. Duplex imaging demonstrates swirling blood flow communicating with the fluid collection adjacent to the common femoral artery with neck measuring 4 mm at the common femoral artery. 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 Signed, Yvone Neu. Loreta Ave, DO [facility] Electronically Signed   By: Gilmer Mor D.O.   On: 07/31/2016 10:57   Dg Chest Port 1 View  Result Date: 07/25/2016 CLINICAL DATA:  Unresponsive.  Hypertension. EXAM: PORTABLE CHEST 1 VIEW COMPARISON:  01/23/2014. FINDINGS: Prior CABG. Cardiomegaly with mild bilateral pulmonary interstitial prominence suggesting mild CHF. No pleural effusion or pneumothorax . IMPRESSION: Prior CABG. Cardiomegaly with mild bilateral from interstitial prominence consistent mild CHF. Electronically Signed   By: Maisie Fus  Register   On: 07/11/2016 10:54   Ct Extrem Lower W Cm Bil  Result Date: 07/07/2016 CLINICAL DATA:  Severe right groin pain. Drop in hematocrit and hemoglobin. Status post right common femoral, profunda femoris and superficial femoral artery endarterectomies with separate core matrix patch angioplasty of the common and superficial femoral arteries and second patchy angioplasty of the profunda femoris artery on 07/03/2016. Additional angioplasties of the right popliteal and right posterior tibial arteries. EXAM: CT PELVIS AND BILATERAL LOWER EXTREMITIES WITH CONTRAST TECHNIQUE: Multidetector CT imaging of the pelvis and bilateral lower extremities was  performed according to the standard protocol following bolus administration of intravenous contrast. Multiplanar CT image reconstructions were also generated. CONTRAST:  ISOVUE-300 IOPAMIDOL (ISOVUE-300) INJECTION 61% COMPARISON:  None. FINDINGS: CT PELVIS FINDINGS Urinary Tract:  Unremarkable urinary bladder and distal ureters. Bowel:  Unremarkable. Vascular/Lymphatic: Atheromatous arterial calcifications and plaques. Reproductive:  Normal sized prostate gland. Other: Small bilateral inguinal hernias containing fat. Mild right groin. Vascular and subcutaneous edema and postoperative air, extending superiorly and inferiorly. No discrete abscess or hematoma seen. Musculoskeletal: Mild lower lumbar spine degenerative changes. Left iliac bone cyst. CT LEFT/RIGHT LOWER EXTREMITY FINDINGS Bones/Joint/Cartilage Unremarkable. Ligaments Suboptimally assessed by CT. Muscles and Tendons Mild low density in the lateral aspect of the proximal adductor longus muscle on the right. No significant enlargement of the masses Soft tissues Mild diffuse subcutaneous edema anteriorly, laterally and medially in the proximal right thigh. Vascular: Multifocal arterial atheromatous calcifications and plaques. IMPRESSION: 1. Right groin postoperative changes with mild associated soft tissue edema and air, including mild edema in the lateral aspect of the proximal adductor longus muscle on the right. No visible hematoma. 2. Extensive bilateral arterial atheromatous changes. 3. Small bilateral inguinal hernias containing fat. Electronically Signed   By: Beckie Salts M.D.   On: 07/07/2016 13:28    ECG & Cardiac Imaging    Rsr, 74, inferior, lateral ST dep with biphasic t changes.  Assessment & Plan    1.  Sepsis:  In setting of recent admission for gangrenous toes req bilat lower extremity angioplasties. Mgmt per IM.  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  Pt is on chronic coumadin in the setting  of PAF.  INR was Rx on admission.  No role for TEE at this time as plan will still be for oral anticoagulation.  Could consider switching from coumadin to a direct oral anticoagulant to ensure therapeutic anticoagulation, once R groin hematoma and anemia felt to be stable.  3.  CAD/elevated troponin:  S/p cabg x 2 in 2015 w/ nstemi in [place] in 08/2015.  No c/p reported on admission.  Suspect mild, flat, troponin elevation represents demand ischemia in the setting of sepsis/stroke. Note that EF is lower on echo this admission than in 11/2015. Given ongoing alteration of mental status and absence of c/p, would not pursue additional ischemic evaluation at this time.  Was on  blocker, statin, asa, nitrate @ home.  Plan to resume once tolerated.  4.  ICM/HFrEF:  Volume mgmt per nephrology in HD.  Was on  blocker and nitrate @ home.  Currently on hold.  Follow.  5.  PAF:  In sinus.  Coumadin currently on hold in setting of anemia/R groin hematoma.  See #2.  6.  ESRD:  HD per nephrology.  Signed, Nicolasa Ducking, NP 07/31/2016, 12:13 PM

## 2016-07-31 NOTE — Clinical Social Work Note (Signed)
Clinical Social Work Assessment  Patient Details  Name: Eddie DraftsMauro Nolasco MRN: [id number] Date of Birth: 1955-12-03  Date of referral:  07/31/16               Reason for consult:  Facility Placement                Permission sought to share information with:    Permission granted to share information::     Name::        Agency::     Relationship::     Contact Information:     Housing/Transportation Living arrangements for the past 2 months:  [facility] Source of Information:  Adult Children Patient Interpreter Needed:  None Criminal Activity/Legal Involvement Pertinent to Current Situation/Hospitalization:  No - Comment as needed Significant Relationships:  Adult Children Lives with:  Adult Children Do you feel safe going back to the place where you live?    Need for family participation in patient care:  Yes (Comment)  Care giving concerns:  Patient resides at home but has been at [facility] for STR.   Social Worker assessment / plan:  Patient has had a decline in health and has had multiple acute strokes and is non communicative at this time. CSW spoke with patient's daughter: Brett FairyFrancisca Dietzman: [phone]: [phone] with the assistance of a hospital interpreter. Patient's daughter remembered this CSW from patient's previous admission. Patient's daughter is aware that her father is not doing well. She is somewhat tearful. CSW offered support and encouragement. CSW advised that we would follow her dad and facilitate whatever discharge plan is needed. If patient is able to return to [facility], she is in agreement with this.   Employment status:  Disabled (Comment on whether or not currently receiving Disability) Insurance information:  Medicare PT Recommendations:    Information / Referral to community resources:     Patient/Family's Response to care:  Patient's daughter expressed appreciation for CSW assistance.   Patient/Family's Understanding of and Emotional Response  to Diagnosis, Current Treatment, and Prognosis:  Patient unable to communicate at this time and is lethargic. Patient's daughter is attempting to process all the information she has been provided today as prognosis given was poor.  Emotional Assessment Appearance:  Appears stated age Attitude/Demeanor/Rapport:   (not communicative) Affect (typically observed):   (not communicative) Orientation:   (unable to assess) Alcohol / Substance use:  Not Applicable Psych involvement (Current and /or in the community):  No (Comment)  Discharge Needs  Concerns to be addressed:  Care Coordination Readmission within the last 30 days:  Yes Current discharge risk:  None Barriers to Discharge:  No Barriers Identified   York SpanielMonica Travor Royce, LCSW 07/31/2016, 11:48 AM

## 2016-07-31 NOTE — Progress Notes (Deleted)
Anticoagulation Reversal Consult Note  Pharmacy Consult for INR monitoring post KCENTRA Indication: severe subarachnoid hemorrhage and hemorrhage into right frontal lobe  Assessment: Pharmacy consulted to monitor INR post KCENTRA in this [age] yoF with ICH. Patient on warfarin therapy PTA with supratherapeutic INR.(3.46) today. Patient also has order for vitK 10 mg. Neurology following.  Monitoring: INR will be checked 60 min post Kcentra then every 6 hours x 4 and daily x 2   Plan:  Date Time INR 6/1 0430 3.46   No Known Allergies  Patient Measurements: Height: 5\' 8"  (172.7 cm) Weight: 178 lb 9.2 oz (81 kg) IBW/kg (Calculated) : 68.4  Vital Signs: Temp: 98.2 F (36.8 C) (06/01 1157) Temp Source: Oral (06/01 1157) BP: 115/55 (06/01 1157) Pulse Rate: 79 (06/01 1157)  Labs:  Recent Labs  01/22/2017 2146  07/29/16 0351 07/30/16 0325 07/30/16 1756 07/31/16 0430  HGB  --   < > 6.8* 8.0* 8.2* 8.3*  HCT  --   --  20.3* 23.7*  --  24.6*  PLT  --   --  203 210  --  213  LABPROT  --   --  33.3* 38.7*  --  35.6*  INR  --   --  3.18 3.84  --  3.46  CREATININE  --   --  9.25* 5.71*  --   --   TROPONINI 0.45*  --   --   --   --   --   < > = values in this interval not displayed.  Estimated Creatinine Clearance: 13 mL/min (A) (by C-G formula based on SCr of 5.71 mg/dL (H)).   Medical History: Past Medical History:  Diagnosis Date  . Chronic systolic CHF (congestive heart failure) (HCC)    a. 08/2015 Echo: EF 30-35%;  b. 11/2015 Echo: EF 60-65%; c. 06/2016 Echo: Ef 30-35%.  . Coronary artery disease    a. 01/2014 CABG x 2: LIMA->LAD, VG->OM; b. 08/2015 NSTEMI in [place]-->med Rx.  . Diabetes mellitus without complication (HCC)   . Elevated lipids   . Enlarged prostate   . ESRD (end stage renal disease) (HCC)    a. TTS dialysis since 08/2016.  [name]. Hypertension   . Ischemic cardiomyopathy    a. 08/2015 Echo: EF 30-35%;  b. 11/2015 Echo: EF 60-65%; c. 06/2016 Echo: Ef 30-35%,  restrictive phys, mildly dil LA/RV, mild to mod RA dil, mild to mod TR.  [name]. PAF (paroxysmal atrial fibrillation) (HCC)    a. 01/2016 noted on event monitoring - 8% of time.  [name]. PVD (peripheral vascular disease) (HCC)    a. 05/2016 complicated by gangrene of toes-->s/p PTA of L SFA and L popliteal w/ 5mm Lutonix DEB;  b. 06/2016 Endarterctomy and patch angioplasty to RCFA, RSFA, Profunda, and R PT & distal RSFA.  . Stroke [facility](HCC)    a. 08/2015 MRI brain showed very small distal bilat embolic appearing CVAs ([place]);  b. 05/2016 subacute R vision loss->CT head showed R Thalamic CVA.    Medications:  Scheduled:  . aspirin EC  81 mg Oral Daily  . insulin aspart  0-9 Units Subcutaneous Q4H  . polyethylene glycol  17 g Oral Daily  . sodium chloride flush  3 mL Intravenous Q12H   Infusions:  . sodium chloride Stopped (01/22/2017 2316)  . sodium chloride    . sodium chloride    . dextrose 50 mL/hr at 07/31/16 1215  . phytonadione (VITAMIN K) IV    . piperacillin-tazobactam (ZOSYN)  IV 3.375  g (07/31/16 1159)  . prothrombin complex conc human (Kcentra) IVPB      Horris Latino, PharmD Pharmacy Resident 07/31/2016 5:55 PM

## 2016-07-31 NOTE — Consult Note (Signed)
Referring Physician: Winona Legato    Chief Complaint: AMS  HPI: Eddie Myers is an 61 y.o. male who due to mental status is unable to provide any history.  Family at beside and an interpreter used.  It appears the patient was in a facility.  Had been altered for a couple of days and at the insistence of the family sought medical attention.  Patient was febrile and hypoglycemic.  Was felt to be septic but despite starting antibiotics had no improvement.  On Coumadin with a therapeutic INR.    Date last known well: Unable to determine Time last known well: Unable to determine tPA Given: No: Unable to determine LKW  Past Medical History:  Diagnosis Date  . Chronic systolic CHF (congestive heart failure) (HCC)    a. 08/2015 Echo: EF 30-35%;  b. 11/2015 Echo: EF 60-65%; c. 06/2016 Echo: Ef 30-35%.  . Coronary artery disease    a. 01/2014 CABG x 2: LIMA->LAD, VG->OM; b. 08/2015 NSTEMI in [place]-->med Rx.  . Diabetes mellitus without complication (HCC)   . Elevated lipids   . Enlarged prostate   . ESRD (end stage renal disease) (HCC)    a. TTS dialysis since 08/2016.  [name] Hypertension   . Ischemic cardiomyopathy    a. 08/2015 Echo: EF 30-35%;  b. 11/2015 Echo: EF 60-65%; c. 06/2016 Echo: Ef 30-35%, restrictive phys, mildly dil LA/RV, mild to mod RA dil, mild to mod TR.  [name] PAF (paroxysmal atrial fibrillation) (HCC)    a. 01/2016 noted on event monitoring - 8% of time.  [name] PVD (peripheral vascular disease) (HCC)    a. 05/2016 complicated by gangrene of toes-->s/p PTA of L SFA and L popliteal w/ 5mm Lutonix DEB;  b. 06/2016 Endarterctomy and patch angioplasty to RCFA, RSFA, Profunda, and R PT & distal RSFA.  . Stroke [facility])    a. 08/2015 MRI brain showed very small distal bilat embolic appearing CVAs ([place]);  b. 05/2016 subacute R vision loss->CT head showed R Thalamic CVA.    Past Surgical History:  Procedure Laterality Date  . ABDOMINAL AORTOGRAM W/LOWER EXTREMITY Left 06/26/2016   Procedure: Abdominal  Aortogram w/Lower Extremity;  Surgeon: Renford Dills, MD;  Location: [facility] INVASIVE CV LAB;  Service: Cardiovascular;  Laterality: Left;  . AMPUTATION TOE Bilateral 07/22/2016   Procedure: AMPUTATION TOE LEFT GREAT TOE AND RIGHT 2ND TOE;  Surgeon: Linus Galas, DPM;  Location: [facility] ORS;  Service: Podiatry;  Laterality: Bilateral;  . AV FISTULA PLACEMENT Left 01/10/2016   Procedure: INSERTION OF ARTERIOVENOUS (AV) GORE-TEX GRAFT ARM ( BRACH / AXILLARY GRAFT );  Surgeon: Renford Dills, MD;  Location: [facility] ORS;  Service: Vascular;  Laterality: Left;  . CARDIAC CATHETERIZATION    . CORONARY ARTERY BYPASS GRAFT  01/2014   [facility] (LIMA -> LAD and SVG -> OM)  . INSERTION OF DIALYSIS CATHETER Right    Perma catheter  . LOWER EXTREMITY ANGIOGRAPHY  07/03/2016   Procedure: Lower Extremity Angiography;  Surgeon: Renford Dills, MD;  Location: [facility] INVASIVE CV LAB;  Service: Cardiovascular;;  . LOWER EXTREMITY INTERVENTION  07/03/2016   Procedure: Lower Extremity Intervention;  Surgeon: Renford Dills, MD;  Location: [facility] INVASIVE CV LAB;  Service: Cardiovascular;;  . PERIPHERAL VASCULAR BALLOON ANGIOPLASTY Left 07/01/2016   Procedure: Peripheral Vascular Balloon Angioplasty;  Surgeon: Renford Dills, MD;  Location: [facility] INVASIVE CV LAB;  Service: Cardiovascular;  Laterality: Left;  . PERIPHERAL VASCULAR BALLOON ANGIOPLASTY N/A 07/03/2016   Procedure: Peripheral Vascular Balloon Angioplasty;  Surgeon: Renford Dills, MD;  Location: [facility] INVASIVE CV LAB;  Service: Cardiovascular;  Laterality: N/A;  . PERIPHERAL VASCULAR CATHETERIZATION N/A 01/17/2016   Procedure: Thrombectomy;  Surgeon: Renford Dills, MD;  Location: [facility] INVASIVE CV LAB;  Service: Cardiovascular;  Laterality: N/A;  . PERIPHERAL VASCULAR CATHETERIZATION Left 01/28/2016   Procedure: A/V Fistulagram;  Surgeon: Renford Dills, MD;  Location: [facility] INVASIVE CV LAB;  Service: Cardiovascular;  Laterality: Left;  .  PERIPHERAL VASCULAR CATHETERIZATION N/A 03/24/2016   Procedure: Dialysis/Perma Catheter Removal;  Surgeon: Renford Dills, MD;  Location: [facility] INVASIVE CV LAB;  Service: Cardiovascular;  Laterality: N/A;  . UPPER EXTREMITY ANGIOGRAM Left 01/28/2016   Procedure: Upper Extremity Angiogram;  Surgeon: Renford Dills, MD;  Location: [facility] INVASIVE CV LAB;  Service: Cardiovascular;  Laterality: Left;    Family History  Problem Relation Age of Onset  . Cancer Mother   . Diabetes Mother   . Diabetes Father    Social History:  reports that he quit smoking about 18 months ago. He has a 22.50 pack-year smoking history. He has never used smokeless tobacco. He reports that he does not drink alcohol or use drugs.  Allergies: No Known Allergies  Medications:  I have reviewed the patient's current medications. Prior to Admission:  Prescriptions Prior to Admission  Medication Sig Dispense Refill Last Dose  . amLODipine (NORVASC) 2.5 MG tablet Take 2.5 mg by mouth daily.   07/27/2016 at 1800  . [EXPIRED] amoxicillin-clavulanate (AUGMENTIN) 500-125 MG tablet Take 1 tablet (500 mg total) by mouth every 12 (twelve) hours. 12 tablet 0 07/27/2016 at 0600  . aspirin EC 81 MG tablet Take 1 tablet (81 mg total) by mouth daily.   07/27/2016 at 1800  . atorvastatin (LIPITOR) 80 MG tablet Take 80 mg by mouth every evening.    07/27/2016 at 1800  . carvedilol (COREG) 25 MG tablet Take 25 mg by mouth 2 (two) times daily with a meal.   07/24/2016 at 0600  . docusate sodium (COLACE) 100 MG capsule Take 2 capsules (200 mg total) by mouth 2 (two) times daily. 10 capsule 0 07/27/2016 at 0600  . glipiZIDE (GLUCOTROL) 5 MG tablet Take 5 mg by mouth 2 (two) times daily.    07/27/2016 at 0600  . insulin glargine (LANTUS) 100 UNIT/ML injection Inject 21 Units into the skin at bedtime.   07/27/2016 at 2100  . isosorbide mononitrate (IMDUR) 30 MG 24 hr tablet Take 30 mg by mouth daily.   07/27/2016 at 1800  . megestrol (MEGACE) 20 MG  tablet Take 20 mg by mouth daily.   07/27/2016 at 0600  . Melatonin 3 MG TABS Take 3 mg by mouth at bedtime.    07/27/2016 at 2000  . oxyCODONE (OXYCONTIN) 15 mg 12 hr tablet Take 1 tablet (15 mg total) by mouth every 12 (twelve) hours. 14 tablet 0 07/18/2016 at 0900  . oxyCODONE 20 MG TABS Take 1 tablet (20 mg total) by mouth every 4 (four) hours as needed for moderate pain or breakthrough pain (mild pain). 30 tablet 0 07/27/2016 at 1200  . pregabalin (LYRICA) 75 MG capsule Take 1 capsule (75 mg total) by mouth 2 (two) times daily. 30 capsule 0 07/11/2016 at 0600  . sevelamer carbonate (RENVELA) 800 MG tablet Take 1,600 mg by mouth 3 (three) times daily with meals.   07/09/2016 at 0800  . tamsulosin (FLOMAX) 0.4 MG CAPS capsule Take 0.4 mg by mouth at bedtime.   07/27/2016 at 2000  .  traZODone (DESYREL) 50 MG tablet Take 0.5 tablets (25 mg total) by mouth at bedtime as needed for sleep. 30 tablet 0 07/27/2016 at 2000  . warfarin (COUMADIN) 5 MG tablet Take 1 tablet (5 mg total) by mouth daily at 6 PM. 5 mg once daily for 2 days. Repeat PT/INR on May 10 further management of Coumadin by the nursing home physician 2 tablet 0 07/27/2016 at 2000  . acetaminophen (TYLENOL) 325 MG tablet Take 2 tablets (650 mg total) by mouth every 6 (six) hours as needed for mild pain (or Fever >/= 101).   prn at prn  . insulin aspart (NOVOLOG) 100 UNIT/ML injection Inject 0-5 Units into the skin at bedtime. ssi insulin 0-150 no insulin 151-200 2 units 201-250 3 units 251-300 4 units 301-350 5 untis 351-400 6 units If greater then 401 call md 10 mL 11 prn at prn  . ipratropium-albuterol (DUONEB) 0.5-2.5 (3) MG/3ML SOLN Take 3 mLs by nebulization every 6 (six) hours as needed. 360 mL  prn at prn  . nitroGLYCERIN (NITROSTAT) 0.4 MG SL tablet Place 0.4 mg under the tongue every 5 (five) minutes as needed for chest pain.   prn at prn  . Nutritional Supplements (FEEDING SUPPLEMENT, NEPRO CARB STEADY,) LIQD Take 237 mLs by mouth 2  (two) times daily between meals. 60 Can 0   . ondansetron (ZOFRAN) 4 MG tablet Take 1 tablet (4 mg total) by mouth every 6 (six) hours as needed for nausea. 20 tablet 0 prn at prn  . polyethylene glycol (MIRALAX / GLYCOLAX) packet Take 17 g by mouth daily as needed for moderate constipation. 14 each 0 prn at prn  . senna-docusate (SENOKOT-S) 8.6-50 MG tablet Take 1 tablet by mouth at bedtime as needed for mild constipation.   prn at prn   Scheduled: . aspirin EC  81 mg Oral Daily  . insulin aspart  0-9 Units Subcutaneous Q4H  . polyethylene glycol  17 g Oral Daily  . sodium chloride flush  3 mL Intravenous Q12H    ROS: Unable to provide due to mental status  Physical Examination: Blood pressure (!) 115/55, pulse 79, temperature 98.2 F (36.8 C), temperature source Oral, resp. rate 18, height 5\' 8"  (1.727 m), weight 81 kg (178 lb 9.2 oz), SpO2 100 %.  HEENT-  Normocephalic, no lesions, without obvious abnormality.  Normal external eye and conjunctiva.  Normal TM's bilaterally.  Normal auditory canals and external ears. Normal external nose, mucus membranes and septum.  Normal pharynx. Cardiovascular- S1, S2 normal, pulses palpable throughout   Lungs- chest clear, no wheezing, rales, normal symmetric air entry Abdomen- soft, non-tender; bowel sounds normal; no masses,  no organomegaly Extremities- LLE bandaged and splinted Lymph-no adenopathy palpable Musculoskeletal-no joint tenderness, deformity or swelling Skin-warm and dry, no hyperpigmentation, vitiligo, or suspicious lesions  Neurological Examination   Mental Status: Lethargic.  Speech dysarthric.  Follows some simple commands.   Cranial Nerves: II: Discs flat bilaterally; Does not blink to right confrontation.  Pupils reactive  III,IV, VI: ptosis not present, extra-ocular motions intact bilaterally V,VII: facial asymmetry, facial light touch sensation normal bilaterally VIII: hearing normal bilaterally IX,X: gag reflex  reduced XI: bilateral shoulder shrug XII: tongue extension attempted but not complete Motor: Right : Upper extremity   Moves against gravity spontaneously    Left:     Upper extremity   No movement noted  Lower extremity   Minimal movement     Lower extremity   Moves against gravity spontaneously  Tone and bulk:normal tone throughout; no atrophy noted Sensory: Does not respond to noxious stimuli in the upper extremities.  Responds to noxious stimuli in the LLE.   Deep Tendon Reflexes: 2+ in the upper extremities, absent in the lower extremities Plantars: Unable to test Cerebellar: Unable to test due to mental status Gait: not tested due to safety concerns   Laboratory Studies:  Basic Metabolic Panel:  Recent Labs Lab 08/02/2016 1029 07/29/16 0351 07/30/16 0325 07/30/16 1756  NA 131* 130* 135  --   K 4.7 4.6 4.4  --   CL 93* 91* 93*  --   CO2 27 26 32  --   GLUCOSE 56* 88 110*  --   BUN 47* 53* 27*  --   CREATININE 8.60* 9.25* 5.71*  --   CALCIUM 8.1* 8.0* 7.9*  --   MG  --   --   --  1.8    Liver Function Tests:  Recent Labs Lab 2016/08/02 1029 07/29/16 0351  AST 32 28  ALT 21 18  ALKPHOS 262* 230*  BILITOT 0.7 1.0  PROT 6.7 6.5  ALBUMIN 2.3* 2.2*   No results for input(s): LIPASE, AMYLASE in the last 168 hours.  Recent Labs Lab 07/31/16 1438  AMMONIA 19    CBC:  Recent Labs Lab Aug 02, 2016 1029 2016-08-02 1552 07/29/16 0351 07/30/16 0325 07/30/16 1756 07/31/16 0430  WBC 7.1  --  6.0 5.6  --  6.1  NEUTROABS 5.7  --   --   --   --   --   HGB 6.7* 7.1* 6.8* 8.0* 8.2* 8.3*  HCT 20.1*  --  20.3* 23.7*  --  24.6*  MCV 89.0  --  87.6 85.6  --  87.4  PLT 208  --  203 210  --  213    Cardiac Enzymes:  Recent Labs Lab 08-02-16 1029 02-Aug-2016 1552 August 02, 2016 2146  TROPONINI 0.51* 0.53* 0.45*    BNP: Invalid input(s): POCBNP  CBG:  Recent Labs Lab 07/30/16 1210 07/30/16 1956 07/30/16 2357 07/31/16 0752 07/31/16 1122  GLUCAP 152* 89 126* 152*  149*    Microbiology: Results for orders placed or performed during the hospital encounter of 02-Aug-2016  Blood Culture (routine x 2)     Status: None (Preliminary result)   Collection Time: 02-Aug-2016 10:30 AM  Result Value Ref Range Status   Specimen Description BLOOD  RIGHT HAND  Final   Special Requests BOTTLES DRAWN AEROBIC AND ANAEROBIC  BCAV  Final   Culture NO GROWTH 3 DAYS  Final   Report Status PENDING  Incomplete  Blood Culture (routine x 2)     Status: None (Preliminary result)   Collection Time: 02-Aug-2016 10:30 AM  Result Value Ref Range Status   Specimen Description BLOOD  RIGHT FOREARM  Final   Special Requests   Final    BOTTLES DRAWN AEROBIC AND ANAEROBIC Blood Culture results may not be optimal due to an excessive volume of blood received in culture bottles   Culture NO GROWTH 3 DAYS  Final   Report Status PENDING  Incomplete  MRSA PCR Screening     Status: None   Collection Time: 08/02/2016  2:15 PM  Result Value Ref Range Status   MRSA by PCR NEGATIVE NEGATIVE Final    Comment:        The GeneXpert MRSA Assay (FDA approved for NASAL specimens only), is one component of a comprehensive MRSA colonization surveillance program. It is not intended to diagnose MRSA  infection nor to guide or monitor treatment for MRSA infections.     Coagulation Studies:  Recent Labs  07/04/2016 1552 07/29/16 0351 07/30/16 0325 07/31/16 0430  LABPROT 29.4* 33.3* 38.7* 35.6*  INR 2.72 3.18 3.84 3.46    Urinalysis: No results for input(s): COLORURINE, LABSPEC, PHURINE, GLUCOSEU, HGBUR, BILIRUBINUR, KETONESUR, PROTEINUR, UROBILINOGEN, NITRITE, LEUKOCYTESUR in the last 168 hours.  Invalid input(s): APPERANCEUR  Lipid Panel: No results found for: CHOL, TRIG, HDL, CHOLHDL, VLDL, LDLCALC  HgbA1C:  Lab Results  Component Value Date   HGBA1C 6.0 (H) 07/04/2016    Urine Drug Screen:  No results found for: LABOPIA, COCAINSCRNUR, LABBENZ, AMPHETMU, THCU, LABBARB  Alcohol Level:  No results for input(s): ETH in the last 168 hours.  Other results: EKG: sinus rhythm at 74 bpm.  Imaging: Ct Chest W Contrast  Result Date: 07/30/2016 CLINICAL DATA:  61 year old male with fever, hypotension, sepsis. End-stage renal disease on dialysis. Status post right femoral vascular surgery earlier this month. EXAM: CT CHEST, ABDOMEN, AND PELVIS WITH CONTRAST TECHNIQUE: Multidetector CT imaging of the chest, abdomen and pelvis was performed following the standard protocol during bolus administration of intravenous contrast. CONTRAST:  ISOVUE-300 IOPAMIDOL (ISOVUE-300) INJECTION 61% COMPARISON:  Pelvis CT with contrast 07/07/2016. Chest radiographs 07/26/2016 and earlier. FINDINGS: CT CHEST FINDINGS Cardiovascular: Mild cardiomegaly. Calcified coronary artery and aortic atherosclerosis. Left subclavian vascular stent. Suboptimal intravascular contrast bolus, such that great vessel patency is not well evaluated. Mediastinum/Nodes: There are number of small calcified mediastinal and hilar lymph nodes. Superimposed larger anterior carina lymph nodes up to 17 mm short axis. Lungs/Pleura: Major airways are patent aside from atelectatic changes at both hila. Mild diffuse pulmonary septal thickening. Intermittent right upper lobe subpleural and centrilobular nodularity is superimposed. No similar nodularity in the left lower lobe. Confluent mostly dependent opacity in the lingula and both lower lobes. Superimposed small layering pleural effusions. Superimposed calcified granuloma in the right lower lobe. Musculoskeletal: Prior sternotomy. No acute osseous abnormality identified. CT ABDOMEN PELVIS FINDINGS Hepatobiliary: Small volume nonspecific perihepatic fluid. Negative liver. No definite gallbladder inflammation. No biliary ductal enlargement identified. Pancreas: Negative. Spleen: Negative except for a small volume of perisplenic fluid. Adrenals/Urinary Tract: Normal adrenal glands. Poor bilateral  renal enhancement and no significant renal contrast excretion on delayed images. Bilateral renal artery calcified atherosclerosis. Nonspecific perinephric stranding. No suspicious renal lesion. Mildly distended and otherwise unremarkable urinary bladder. Stomach/Bowel: Negative rectum. Negative sigmoid colon aside from retained stool. Negative left colon and transverse colon aside from retained stool. Small volume of fluid in the right gutter with mild right colonic wall thickening. The appendix appears stable. Oral contrast has just reached the ileocecal valve. No dilated small bowel. Decompressed stomach. Negative duodenum. Small volume upper abdominal fluid including along the inferior liver margin and about the spleen. No pneumoperitoneum. Vascular/Lymphatic: Extensive calcified atherosclerosis throughout the abdomen and pelvis including the aorta, and iliac, and femoral arteries suboptimal intravascular contrast bolus such that vascular patency is not well evaluated. There is a 2.6 cm hematoma or pseudoaneurysm at the bifurcation of the right common femoral artery as seen on series 2, image 128 which is new since 07/07/2016. There is also a 3 cm simple fluid density collection superficial to the right common femoral artery now (series 2, image 118). There were postoperative changes with a small volume of soft tissue gas at both of these locations on the prior study. No lymphadenopathy in the abdomen or pelvis. Reproductive: Negative. Other: Small volume simple density pelvic free fluid.  Musculoskeletal: Mild grade 1 anterolisthesis of L4 on L5. No acute osseous abnormality identified. IMPRESSION: 1. There is a 2.6 cm hematoma versus pseudoaneurysm at the bifurcation of the right common femoral artery in the right inguinal region which is new compared to 07/07/2016. Recommend further evaluation with vascular ultrasound. Superimposed superficial 3 cm probable liquefying hematoma superficial to the right CFA. 2.  Small bilateral layering pleural effusions. Nonspecific small volume of free fluid in the abdomen and pelvis. 3. Combination of pulmonary septal thickening with mild right upper lobe nodularity, lingula and bilateral lower lobe dependent and peribronchial opacity. Differential considerations include Pulmonary Edema with atelectasis versus Viral/atypical Respiratory Infection. 4. Underlying remote thoracic granulomatous disease. Superimposed reactive appearing mediastinal lymphadenopathy felt related to #3. 5. Up to mild wall thickening of the right colon could be reactive due to the nearby free fluid in the right gutter or reflect mild focal colitis. No other bowel inflammation. 6. Cardiomegaly and extensive calcified atherosclerosis throughout the chest abdomen and pelvis. Suboptimal intravascular contrast bolus such that vascular patency is not well evaluated on this study. Electronically Signed   By: Odessa Fleming M.D.   On: 07/30/2016 14:37   Mr Brain Wo Contrast  Result Date: 07/30/2016 CLINICAL DATA:  Initial evaluation for acute confusion. EXAM: MRI HEAD WITHOUT CONTRAST TECHNIQUE: Multiplanar, multiecho pulse sequences of the brain and surrounding structures were obtained without intravenous contrast. COMPARISON:  Comparison made with prior CT from 07/05/2016 as well as recent MRI from 06/26/2016. FINDINGS: Brain: Study fairly advanced by motion artifact. Generalized age-related cerebral atrophy again noted. Mild for age chronic microvascular ischemic changes present within the periventricular and deep white matter both cerebral hemispheres. Multiple remote lacunar infarcts seen within the bilateral thalami. Remote infarct noted within the inferior left cerebellar hemisphere. There is 13 mm focus of abnormal restricted diffusion at the central aspect of the splenium of the corpus callosum (series 4, image 28). Slight extension to the left. Additional punctate 5 mm focus of diffusion abnormality at the left  globus pallidus (series 4, image 27). Additional tiny focus of diffusion abnormality along the body of the corpus callosum (series 4, image 28). Few additional punctate foci of diffusion abnormality within mean periventricular cerebral white matter (series 4, image 26 on the right, series 7, image 24 on the left). Findings are suspected to be ischemic in nature, likely embolic given the various vascular distributions involved. While other entities including seizure and toxic metabolic derangement are associated with cytotoxic lesions of the splenium, the presence of the additional foci would perhaps favor an ischemic etiology. No associated hemorrhage or mass effect. No other evidence for acute or subacute ischemia. Gray-white matter differentiation otherwise grossly maintained. No evidence for acute or chronic intracranial hemorrhage. No mass lesion, midline shift or mass effect. Ventricles normal size without evidence for hydrocephalus. No extra-axial fluid collection. Major dural sinuses are grossly patent. Pituitary gland grossly unremarkable. Vascular: Major intracranial vascular flow voids are grossly maintained. Skull and upper cervical spine: Craniocervical junction unremarkable. Visualized upper cervical spine grossly unremarkable. Bone marrow signal intensity within normal limits. No scalp soft tissue abnormality. Sinuses/Orbits: Globes and orbital soft tissues grossly unremarkable. Paranasal sinuses are largely clear. No mastoid effusion. Other: None. IMPRESSION: 1. Patchy abnormal restricted diffusion involving the splenium/corpus callosum, left basal ganglia, and periventricular white matter as above. Findings favored to be ischemic in nature, likely embolic. While other entities including seizure and toxic metabolic derangement are also associated with cytotoxic lesions of the splenium, the presence of  the additional foci would perhaps favor an ischemic etiology. 2. Otherwise stable grossly stable  appearance of the brain on this motion degraded exam. Stable atrophy with mild chronic small vessel ischemic disease. Remote bilateral thalamic lacunar infarcts, with remote left cerebellar infarct. Electronically Signed   By: Rise Mu M.D.   On: 07/30/2016 15:26   Ct Abdomen Pelvis W Contrast  Result Date: 07/30/2016 CLINICAL DATA:  61 year old male with fever, hypotension, sepsis. End-stage renal disease on dialysis. Status post right femoral vascular surgery earlier this month. EXAM: CT CHEST, ABDOMEN, AND PELVIS WITH CONTRAST TECHNIQUE: Multidetector CT imaging of the chest, abdomen and pelvis was performed following the standard protocol during bolus administration of intravenous contrast. CONTRAST:  ISOVUE-300 IOPAMIDOL (ISOVUE-300) INJECTION 61% COMPARISON:  Pelvis CT with contrast 07/07/2016. Chest radiographs 2016-07-29 and earlier. FINDINGS: CT CHEST FINDINGS Cardiovascular: Mild cardiomegaly. Calcified coronary artery and aortic atherosclerosis. Left subclavian vascular stent. Suboptimal intravascular contrast bolus, such that great vessel patency is not well evaluated. Mediastinum/Nodes: There are number of small calcified mediastinal and hilar lymph nodes. Superimposed larger anterior carina lymph nodes up to 17 mm short axis. Lungs/Pleura: Major airways are patent aside from atelectatic changes at both hila. Mild diffuse pulmonary septal thickening. Intermittent right upper lobe subpleural and centrilobular nodularity is superimposed. No similar nodularity in the left lower lobe. Confluent mostly dependent opacity in the lingula and both lower lobes. Superimposed small layering pleural effusions. Superimposed calcified granuloma in the right lower lobe. Musculoskeletal: Prior sternotomy. No acute osseous abnormality identified. CT ABDOMEN PELVIS FINDINGS Hepatobiliary: Small volume nonspecific perihepatic fluid. Negative liver. No definite gallbladder inflammation. No biliary  ductal enlargement identified. Pancreas: Negative. Spleen: Negative except for a small volume of perisplenic fluid. Adrenals/Urinary Tract: Normal adrenal glands. Poor bilateral renal enhancement and no significant renal contrast excretion on delayed images. Bilateral renal artery calcified atherosclerosis. Nonspecific perinephric stranding. No suspicious renal lesion. Mildly distended and otherwise unremarkable urinary bladder. Stomach/Bowel: Negative rectum. Negative sigmoid colon aside from retained stool. Negative left colon and transverse colon aside from retained stool. Small volume of fluid in the right gutter with mild right colonic wall thickening. The appendix appears stable. Oral contrast has just reached the ileocecal valve. No dilated small bowel. Decompressed stomach. Negative duodenum. Small volume upper abdominal fluid including along the inferior liver margin and about the spleen. No pneumoperitoneum. Vascular/Lymphatic: Extensive calcified atherosclerosis throughout the abdomen and pelvis including the aorta, and iliac, and femoral arteries suboptimal intravascular contrast bolus such that vascular patency is not well evaluated. There is a 2.6 cm hematoma or pseudoaneurysm at the bifurcation of the right common femoral artery as seen on series 2, image 128 which is new since 07/07/2016. There is also a 3 cm simple fluid density collection superficial to the right common femoral artery now (series 2, image 118). There were postoperative changes with a small volume of soft tissue gas at both of these locations on the prior study. No lymphadenopathy in the abdomen or pelvis. Reproductive: Negative. Other: Small volume simple density pelvic free fluid. Musculoskeletal: Mild grade 1 anterolisthesis of L4 on L5. No acute osseous abnormality identified. IMPRESSION: 1. There is a 2.6 cm hematoma versus pseudoaneurysm at the bifurcation of the right common femoral artery in the right inguinal region which  is new compared to 07/07/2016. Recommend further evaluation with vascular ultrasound. Superimposed superficial 3 cm probable liquefying hematoma superficial to the right CFA. 2. Small bilateral layering pleural effusions. Nonspecific small volume of free fluid in the abdomen  and pelvis. 3. Combination of pulmonary septal thickening with mild right upper lobe nodularity, lingula and bilateral lower lobe dependent and peribronchial opacity. Differential considerations include Pulmonary Edema with atelectasis versus Viral/atypical Respiratory Infection. 4. Underlying remote thoracic granulomatous disease. Superimposed reactive appearing mediastinal lymphadenopathy felt related to #3. 5. Up to mild wall thickening of the right colon could be reactive due to the nearby free fluid in the right gutter or reflect mild focal colitis. No other bowel inflammation. 6. Cardiomegaly and extensive calcified atherosclerosis throughout the chest abdomen and pelvis. Suboptimal intravascular contrast bolus such that vascular patency is not well evaluated on this study. Electronically Signed   By: Odessa Fleming M.D.   On: 07/30/2016 14:37   US Carotid Bilateral  Result Date: 07/31/2016 CLINICAL DATA:  Cerebral infarction. History of hypertension, coronary artery disease and diabetes. EXAM: BILATERAL CAROTID DUPLEX ULTRASOUND TECHNIQUE: Wallace Cullens scale imaging, color Doppler and duplex ultrasound were performed of bilateral carotid and vertebral arteries in the neck. COMPARISON:  None. FINDINGS: Criteria: Quantification of carotid stenosis is based on velocity parameters that correlate the residual internal carotid diameter with NASCET-based stenosis levels, using the diameter of the distal internal carotid lumen as the denominator for stenosis measurement. The following velocity measurements were obtained: RIGHT ICA:  145/39 cm/sec CCA:  69/7 cm/sec SYSTOLIC ICA/CCA RATIO:  2.1 DIASTOLIC ICA/CCA RATIO:  5.6 ECA:  127 cm/sec LEFT ICA:  122/29  cm/sec CCA:  63/10 cm/sec SYSTOLIC ICA/CCA RATIO:  1.9 DIASTOLIC ICA/CCA RATIO:  2.9 ECA:  157 cm/sec RIGHT CAROTID ARTERY: There is a fairly mild amounts of partially calcified plaque present at the level of the proximal right ICA. Velocities do show some elevation, however and no tortuosity is present. Strict categorization of estimated right ICA stenosis is 50- 69%. Stenosis is likely closer to 50% based on grayscale appearance. RIGHT VERTEBRAL ARTERY: Antegrade flow with normal waveform and velocity. LEFT CAROTID ARTERY: Minimal plaque is present in the proximal left ICA. Estimated left ICA stenosis is less than 50%. LEFT VERTEBRAL ARTERY: Antegrade flow with normal waveform and velocity. IMPRESSION: Mild amount of plaque in both proximal internal carotid arteries. Due to some velocity elevation, categorization of right ICA stenosis is 50- 69% (stenosis likely closer to 50%). Estimated left ICA stenosis is less than 50%. Electronically Signed   By: Irish Lack M.D.   On: 07/31/2016 10:48   [place] Lower Ext Art Right Ltd  Result Date: 07/31/2016 CLINICAL DATA:  61 year old male with a history of right common femoral artery hematoma. EXAM: UNILATERAL RIGHT LOWER EXTREMITY ARTERIAL DUPLEX SCAN TECHNIQUE: Gray-scale sonography as well as color Doppler and duplex ultrasound was performed to evaluate the right lower extremity COMPARISON:  CT 07/30/2016 FINDINGS: Grayscale and color directed duplex imaging of the right lower extremity for evaluation of right inguinal hematoma. Ill-defined anechoic fluid collection in the right inguinal region measuring 4.7 cm x 1.8 cm x 3.2 cm. This overlies the femoral vasculature. Duplex imaging demonstrates swirling blood flow communicating with the fluid collection adjacent to the common femoral artery with neck measuring 4 mm at the common femoral artery. IMPRESSION: Directed duplex of the right inguinal region demonstrates evidence of a pseudoaneurysm from the common femoral  artery with adjacent hematoma. Signed, Yvone Neu. Loreta Ave, DO [facility] Electronically Signed   By: Gilmer Mor D.O.   On: 07/31/2016 10:57    Assessment: 61 y.o. male presenting with altered mental status.  Difficult to examine due to lethargy.  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  Embolic source likely.  Patient on Coumadin with therapeutic INR.  Patient with infection and multiple metabolic abnormalities which may be affecting mental status as well.  Carotid dopplers show RICA stenosis at 50-69% and no left ICA hemodynamically significant stenosis.  Echocardiogram shows no cardiac source of emboli with an EF of 30-35%.   Stroke Risk Factors - atrial fibrillation, diabetes mellitus and hypertension  Plan: 1. HgbA1c, fasting lipid panel 2. PT consult, OT consult, Speech consult 3. Prophylactic therapy-Continue Coumadin 4. NPO until RN stroke swallow screen 5. Telemetry monitoring 6. Frequent neuro checks 7. Agree with continuing to address medical issues.     Thana Farr, MD Neurology [phone] 07/31/2016, 3:40 PM

## 2016-07-31 NOTE — Progress Notes (Signed)
CH was paged by the patient's nurse. Family has received a bad prognosis and wanted prayer and grief support. CH prayed and instructed that he was available if needed.   07/31/16 1815  Clinical Encounter Type  Visited With Patient and family together  Visit Type Initial;Spiritual support  Referral From Nurse  Consult/Referral To Chaplain  Spiritual Encounters  Spiritual Needs Prayer;Grief support

## 2016-07-31 NOTE — Progress Notes (Signed)
[facility], [place] 07/31/16  Subjective:   Somnolent. Did not answer any questions. Daughter at bedside tolerated dialysis yesterday. No adverse events reported    Objective:  Vital signs in last 24 hours:  Temp:  [97.7 F (36.5 C)-99.2 F (37.3 C)] 98.2 F (36.8 C) (06/01 1157) Pulse Rate:  [68-88] 79 (06/01 1157) Resp:  [2-21] 18 (06/01 1157) BP: (93-136)/(38-101) 115/55 (06/01 1157) SpO2:  [91 %-100 %] 100 % (06/01 1157) Weight:  [81 kg (178 lb 9.2 oz)-81.3 kg (179 lb 3.7 oz)] 81 kg (178 lb 9.2 oz) (05/31 1849)  Weight change: 2.2 kg (4 lb 13.6 oz) Filed Weights   07/30/16 0500 07/30/16 1515 07/30/16 1849  Weight: 80.2 kg (176 lb 12.9 oz) 81.3 kg (179 lb 3.7 oz) 81 kg (178 lb 9.2 oz)    Intake/Output:    Intake/Output Summary (Last 24 hours) at 07/31/16 1254 Last data filed at 07/31/16 0900  Gross per 24 hour  Intake              480 ml  Output              500 ml  Net              -20 ml     Physical Exam: General: Chronically Ill appearing  HEENT Dry oral mucus membranes  Neck supple  Pulm/lungs Shallow breathing,left basilar crackles  CVS/Heart tachycardic  Abdomen:  Doughy consistency, NT  Extremities: Trace edema  Neurologic: Somnolent; followed only very basic commands.  Skin: No acute rashes, slight bleed over left ear- ? scratching  Access LUE AVG       Basic Metabolic Panel:   Recent Labs Lab 2016/08/16 1029 07/29/16 0351 07/30/16 0325 07/30/16 1756  NA 131* 130* 135  --   K 4.7 4.6 4.4  --   CL 93* 91* 93*  --   CO2 27 26 32  --   GLUCOSE 56* 88 110*  --   BUN 47* 53* 27*  --   CREATININE 8.60* 9.25* 5.71*  --   CALCIUM 8.1* 8.0* 7.9*  --   MG  --   --   --  1.8     CBC:  Recent Labs Lab August 16, 2016 1029 08/16/16 1552 07/29/16 0351 07/30/16 0325 07/30/16 1756 07/31/16 0430  WBC 7.1  --  6.0 5.6  --  6.1  NEUTROABS 5.7  --   --   --   --   --   HGB 6.7* 7.1* 6.8* 8.0* 8.2* 8.3*  HCT 20.1*  --   20.3* 23.7*  --  24.6*  MCV 89.0  --  87.6 85.6  --  87.4  PLT 208  --  203 210  --  213      Lab Results  Component Value Date   HEPBSAG Negative 07/07/2016      Microbiology:  Recent Results (from the past 240 hour(s))  Blood Culture (routine x 2)     Status: None (Preliminary result)   Collection Time: 16-Aug-2016 10:30 AM  Result Value Ref Range Status   Specimen Description BLOOD  RIGHT HAND  Final   Special Requests BOTTLES DRAWN AEROBIC AND ANAEROBIC  BCAV  Final   Culture NO GROWTH 3 DAYS  Final   Report Status PENDING  Incomplete  Blood Culture (routine x 2)     Status: None (Preliminary result)   Collection Time: 08-16-2016 10:30 AM  Result Value Ref Range Status   Specimen  Description BLOOD  RIGHT FOREARM  Final   Special Requests   Final    BOTTLES DRAWN AEROBIC AND ANAEROBIC Blood Culture results may not be optimal due to an excessive volume of blood received in culture bottles   Culture NO GROWTH 3 DAYS  Final   Report Status PENDING  Incomplete  MRSA PCR Screening     Status: None   Collection Time: 07/29/2016  2:15 PM  Result Value Ref Range Status   MRSA by PCR NEGATIVE NEGATIVE Final    Comment:        The GeneXpert MRSA Assay (FDA approved for NASAL specimens only), is one component of a comprehensive MRSA colonization surveillance program. It is not intended to diagnose MRSA infection nor to guide or monitor treatment for MRSA infections.     Coagulation Studies:  Recent Labs  07/13/2016 1552 07/29/16 0351 07/30/16 0325 07/31/16 0430  LABPROT 29.4* 33.3* 38.7* 35.6*  INR 2.72 3.18 3.84 3.46    Urinalysis: No results for input(s): COLORURINE, LABSPEC, PHURINE, GLUCOSEU, HGBUR, BILIRUBINUR, KETONESUR, PROTEINUR, UROBILINOGEN, NITRITE, LEUKOCYTESUR in the last 72 hours.  Invalid input(s): APPERANCEUR    Imaging: Ct Chest W Contrast  Result Date: 07/30/2016 CLINICAL DATA:  61 year old male with fever, hypotension, sepsis. End-stage renal  disease on dialysis. Status post right femoral vascular surgery earlier this month. EXAM: CT CHEST, ABDOMEN, AND PELVIS WITH CONTRAST TECHNIQUE: Multidetector CT imaging of the chest, abdomen and pelvis was performed following the standard protocol during bolus administration of intravenous contrast. CONTRAST:  ISOVUE-300 IOPAMIDOL (ISOVUE-300) INJECTION 61% COMPARISON:  Pelvis CT with contrast 07/07/2016. Chest radiographs 07/29/2016 and earlier. FINDINGS: CT CHEST FINDINGS Cardiovascular: Mild cardiomegaly. Calcified coronary artery and aortic atherosclerosis. Left subclavian vascular stent. Suboptimal intravascular contrast bolus, such that great vessel patency is not well evaluated. Mediastinum/Nodes: There are number of small calcified mediastinal and hilar lymph nodes. Superimposed larger anterior carina lymph nodes up to 17 mm short axis. Lungs/Pleura: Major airways are patent aside from atelectatic changes at both hila. Mild diffuse pulmonary septal thickening. Intermittent right upper lobe subpleural and centrilobular nodularity is superimposed. No similar nodularity in the left lower lobe. Confluent mostly dependent opacity in the lingula and both lower lobes. Superimposed small layering pleural effusions. Superimposed calcified granuloma in the right lower lobe. Musculoskeletal: Prior sternotomy. No acute osseous abnormality identified. CT ABDOMEN PELVIS FINDINGS Hepatobiliary: Small volume nonspecific perihepatic fluid. Negative liver. No definite gallbladder inflammation. No biliary ductal enlargement identified. Pancreas: Negative. Spleen: Negative except for a small volume of perisplenic fluid. Adrenals/Urinary Tract: Normal adrenal glands. Poor bilateral renal enhancement and no significant renal contrast excretion on delayed images. Bilateral renal artery calcified atherosclerosis. Nonspecific perinephric stranding. No suspicious renal lesion. Mildly distended and otherwise unremarkable  urinary bladder. Stomach/Bowel: Negative rectum. Negative sigmoid colon aside from retained stool. Negative left colon and transverse colon aside from retained stool. Small volume of fluid in the right gutter with mild right colonic wall thickening. The appendix appears stable. Oral contrast has just reached the ileocecal valve. No dilated small bowel. Decompressed stomach. Negative duodenum. Small volume upper abdominal fluid including along the inferior liver margin and about the spleen. No pneumoperitoneum. Vascular/Lymphatic: Extensive calcified atherosclerosis throughout the abdomen and pelvis including the aorta, and iliac, and femoral arteries suboptimal intravascular contrast bolus such that vascular patency is not well evaluated. There is a 2.6 cm hematoma or pseudoaneurysm at the bifurcation of the right common femoral artery as seen on series 2, image 128 which is new since  07/07/2016. There is also a 3 cm simple fluid density collection superficial to the right common femoral artery now (series 2, image 118). There were postoperative changes with a small volume of soft tissue gas at both of these locations on the prior study. No lymphadenopathy in the abdomen or pelvis. Reproductive: Negative. Other: Small volume simple density pelvic free fluid. Musculoskeletal: Mild grade 1 anterolisthesis of L4 on L5. No acute osseous abnormality identified. IMPRESSION: 1. There is a 2.6 cm hematoma versus pseudoaneurysm at the bifurcation of the right common femoral artery in the right inguinal region which is new compared to 07/07/2016. Recommend further evaluation with vascular ultrasound. Superimposed superficial 3 cm probable liquefying hematoma superficial to the right CFA. 2. Small bilateral layering pleural effusions. Nonspecific small volume of free fluid in the abdomen and pelvis. 3. Combination of pulmonary septal thickening with mild right upper lobe nodularity, lingula and bilateral lower lobe dependent  and peribronchial opacity. Differential considerations include Pulmonary Edema with atelectasis versus Viral/atypical Respiratory Infection. 4. Underlying remote thoracic granulomatous disease. Superimposed reactive appearing mediastinal lymphadenopathy felt related to #3. 5. Up to mild wall thickening of the right colon could be reactive due to the nearby free fluid in the right gutter or reflect mild focal colitis. No other bowel inflammation. 6. Cardiomegaly and extensive calcified atherosclerosis throughout the chest abdomen and pelvis. Suboptimal intravascular contrast bolus such that vascular patency is not well evaluated on this study. Electronically Signed   By: Odessa Fleming M.D.   On: 07/30/2016 14:37   Mr Brain Wo Contrast  Result Date: 07/30/2016 CLINICAL DATA:  Initial evaluation for acute confusion. EXAM: MRI HEAD WITHOUT CONTRAST TECHNIQUE: Multiplanar, multiecho pulse sequences of the brain and surrounding structures were obtained without intravenous contrast. COMPARISON:  Comparison made with prior CT from 07/04/2016 as well as recent MRI from 06/26/2016. FINDINGS: Brain: Study fairly advanced by motion artifact. Generalized age-related cerebral atrophy again noted. Mild for age chronic microvascular ischemic changes present within the periventricular and deep white matter both cerebral hemispheres. Multiple remote lacunar infarcts seen within the bilateral thalami. Remote infarct noted within the inferior left cerebellar hemisphere. There is 13 mm focus of abnormal restricted diffusion at the central aspect of the splenium of the corpus callosum (series 4, image 28). Slight extension to the left. Additional punctate 5 mm focus of diffusion abnormality at the left globus pallidus (series 4, image 27). Additional tiny focus of diffusion abnormality along the body of the corpus callosum (series 4, image 28). Few additional punctate foci of diffusion abnormality within mean periventricular cerebral white  matter (series 4, image 26 on the right, series 7, image 24 on the left). Findings are suspected to be ischemic in nature, likely embolic given the various vascular distributions involved. While other entities including seizure and toxic metabolic derangement are associated with cytotoxic lesions of the splenium, the presence of the additional foci would perhaps favor an ischemic etiology. No associated hemorrhage or mass effect. No other evidence for acute or subacute ischemia. Gray-white matter differentiation otherwise grossly maintained. No evidence for acute or chronic intracranial hemorrhage. No mass lesion, midline shift or mass effect. Ventricles normal size without evidence for hydrocephalus. No extra-axial fluid collection. Major dural sinuses are grossly patent. Pituitary gland grossly unremarkable. Vascular: Major intracranial vascular flow voids are grossly maintained. Skull and upper cervical spine: Craniocervical junction unremarkable. Visualized upper cervical spine grossly unremarkable. Bone marrow signal intensity within normal limits. No scalp soft tissue abnormality. Sinuses/Orbits: Globes and orbital soft tissues  grossly unremarkable. Paranasal sinuses are largely clear. No mastoid effusion. Other: None. IMPRESSION: 1. Patchy abnormal restricted diffusion involving the splenium/corpus callosum, left basal ganglia, and periventricular white matter as above. Findings favored to be ischemic in nature, likely embolic. While other entities including seizure and toxic metabolic derangement are also associated with cytotoxic lesions of the splenium, the presence of the additional foci would perhaps favor an ischemic etiology. 2. Otherwise stable grossly stable appearance of the brain on this motion degraded exam. Stable atrophy with mild chronic small vessel ischemic disease. Remote bilateral thalamic lacunar infarcts, with remote left cerebellar infarct. Electronically Signed   By: Rise Mu M.D.   On: 07/30/2016 15:26   Ct Abdomen Pelvis W Contrast  Result Date: 07/30/2016 CLINICAL DATA:  61 year old male with fever, hypotension, sepsis. End-stage renal disease on dialysis. Status post right femoral vascular surgery earlier this month. EXAM: CT CHEST, ABDOMEN, AND PELVIS WITH CONTRAST TECHNIQUE: Multidetector CT imaging of the chest, abdomen and pelvis was performed following the standard protocol during bolus administration of intravenous contrast. CONTRAST:  ISOVUE-300 IOPAMIDOL (ISOVUE-300) INJECTION 61% COMPARISON:  Pelvis CT with contrast 07/07/2016. Chest radiographs 07/23/2016 and earlier. FINDINGS: CT CHEST FINDINGS Cardiovascular: Mild cardiomegaly. Calcified coronary artery and aortic atherosclerosis. Left subclavian vascular stent. Suboptimal intravascular contrast bolus, such that great vessel patency is not well evaluated. Mediastinum/Nodes: There are number of small calcified mediastinal and hilar lymph nodes. Superimposed larger anterior carina lymph nodes up to 17 mm short axis. Lungs/Pleura: Major airways are patent aside from atelectatic changes at both hila. Mild diffuse pulmonary septal thickening. Intermittent right upper lobe subpleural and centrilobular nodularity is superimposed. No similar nodularity in the left lower lobe. Confluent mostly dependent opacity in the lingula and both lower lobes. Superimposed small layering pleural effusions. Superimposed calcified granuloma in the right lower lobe. Musculoskeletal: Prior sternotomy. No acute osseous abnormality identified. CT ABDOMEN PELVIS FINDINGS Hepatobiliary: Small volume nonspecific perihepatic fluid. Negative liver. No definite gallbladder inflammation. No biliary ductal enlargement identified. Pancreas: Negative. Spleen: Negative except for a small volume of perisplenic fluid. Adrenals/Urinary Tract: Normal adrenal glands. Poor bilateral renal enhancement and no significant renal contrast excretion  on delayed images. Bilateral renal artery calcified atherosclerosis. Nonspecific perinephric stranding. No suspicious renal lesion. Mildly distended and otherwise unremarkable urinary bladder. Stomach/Bowel: Negative rectum. Negative sigmoid colon aside from retained stool. Negative left colon and transverse colon aside from retained stool. Small volume of fluid in the right gutter with mild right colonic wall thickening. The appendix appears stable. Oral contrast has just reached the ileocecal valve. No dilated small bowel. Decompressed stomach. Negative duodenum. Small volume upper abdominal fluid including along the inferior liver margin and about the spleen. No pneumoperitoneum. Vascular/Lymphatic: Extensive calcified atherosclerosis throughout the abdomen and pelvis including the aorta, and iliac, and femoral arteries suboptimal intravascular contrast bolus such that vascular patency is not well evaluated. There is a 2.6 cm hematoma or pseudoaneurysm at the bifurcation of the right common femoral artery as seen on series 2, image 128 which is new since 07/07/2016. There is also a 3 cm simple fluid density collection superficial to the right common femoral artery now (series 2, image 118). There were postoperative changes with a small volume of soft tissue gas at both of these locations on the prior study. No lymphadenopathy in the abdomen or pelvis. Reproductive: Negative. Other: Small volume simple density pelvic free fluid. Musculoskeletal: Mild grade 1 anterolisthesis of L4 on L5. No acute osseous abnormality identified. IMPRESSION: 1. There  is a 2.6 cm hematoma versus pseudoaneurysm at the bifurcation of the right common femoral artery in the right inguinal region which is new compared to 07/07/2016. Recommend further evaluation with vascular ultrasound. Superimposed superficial 3 cm probable liquefying hematoma superficial to the right CFA. 2. Small bilateral layering pleural effusions. Nonspecific small  volume of free fluid in the abdomen and pelvis. 3. Combination of pulmonary septal thickening with mild right upper lobe nodularity, lingula and bilateral lower lobe dependent and peribronchial opacity. Differential considerations include Pulmonary Edema with atelectasis versus Viral/atypical Respiratory Infection. 4. Underlying remote thoracic granulomatous disease. Superimposed reactive appearing mediastinal lymphadenopathy felt related to #3. 5. Up to mild wall thickening of the right colon could be reactive due to the nearby free fluid in the right gutter or reflect mild focal colitis. No other bowel inflammation. 6. Cardiomegaly and extensive calcified atherosclerosis throughout the chest abdomen and pelvis. Suboptimal intravascular contrast bolus such that vascular patency is not well evaluated on this study. Electronically Signed   By: Odessa Fleming M.D.   On: 07/30/2016 14:37   US Carotid Bilateral  Result Date: 07/31/2016 CLINICAL DATA:  Cerebral infarction. History of hypertension, coronary artery disease and diabetes. EXAM: BILATERAL CAROTID DUPLEX ULTRASOUND TECHNIQUE: Wallace Cullens scale imaging, color Doppler and duplex ultrasound were performed of bilateral carotid and vertebral arteries in the neck. COMPARISON:  None. FINDINGS: Criteria: Quantification of carotid stenosis is based on velocity parameters that correlate the residual internal carotid diameter with NASCET-based stenosis levels, using the diameter of the distal internal carotid lumen as the denominator for stenosis measurement. The following velocity measurements were obtained: RIGHT ICA:  145/39 cm/sec CCA:  69/7 cm/sec SYSTOLIC ICA/CCA RATIO:  2.1 DIASTOLIC ICA/CCA RATIO:  5.6 ECA:  127 cm/sec LEFT ICA:  122/29 cm/sec CCA:  63/10 cm/sec SYSTOLIC ICA/CCA RATIO:  1.9 DIASTOLIC ICA/CCA RATIO:  2.9 ECA:  157 cm/sec RIGHT CAROTID ARTERY: There is a fairly mild amounts of partially calcified plaque present at the level of the proximal right ICA.  Velocities do show some elevation, however and no tortuosity is present. Strict categorization of estimated right ICA stenosis is 50- 69%. Stenosis is likely closer to 50% based on grayscale appearance. RIGHT VERTEBRAL ARTERY: Antegrade flow with normal waveform and velocity. LEFT CAROTID ARTERY: Minimal plaque is present in the proximal left ICA. Estimated left ICA stenosis is less than 50%. LEFT VERTEBRAL ARTERY: Antegrade flow with normal waveform and velocity. IMPRESSION: Mild amount of plaque in both proximal internal carotid arteries. Due to some velocity elevation, categorization of right ICA stenosis is 50- 69% (stenosis likely closer to 50%). Estimated left ICA stenosis is less than 50%. Electronically Signed   By: Irish Lack M.D.   On: 07/31/2016 10:48   [place] Lower Ext Art Right Ltd  Result Date: 07/31/2016 CLINICAL DATA:  61 year old male with a history of right common femoral artery hematoma. EXAM: UNILATERAL RIGHT LOWER EXTREMITY ARTERIAL DUPLEX SCAN TECHNIQUE: Gray-scale sonography as well as color Doppler and duplex ultrasound was performed to evaluate the right lower extremity COMPARISON:  CT 07/30/2016 FINDINGS: Grayscale and color directed duplex imaging of the right lower extremity for evaluation of right inguinal hematoma. Ill-defined anechoic fluid collection in the right inguinal region measuring 4.7 cm x 1.8 cm x 3.2 cm. This overlies the femoral vasculature. Duplex imaging demonstrates swirling blood flow communicating with the fluid collection adjacent to the common femoral artery with neck measuring 4 mm at the common femoral artery. IMPRESSION: Directed duplex of the right inguinal region  demonstrates evidence of a pseudoaneurysm from the common femoral artery with adjacent hematoma. Signed, Yvone NeuJaime S. Loreta AveWagner, DO [facility] Electronically Signed   By: Gilmer MorJaime  Wagner D.O.   On: 07/31/2016 10:57     Medications:   . sodium  chloride Stopped (07/24/2016 2316)  . sodium chloride    . sodium chloride    . dextrose 50 mL/hr at 07/31/16 1215  . piperacillin-tazobactam (ZOSYN)  IV 3.375 g (07/31/16 1159)   . aspirin EC  81 mg Oral Daily  . insulin aspart  0-9 Units Subcutaneous Q4H  . polyethylene glycol  17 g Oral Daily  . sodium chloride flush  3 mL Intravenous Q12H   acetaminophen **OR** acetaminophen, albuterol, haloperidol lactate, ondansetron **OR** ondansetron (ZOFRAN) IV, polyethylene glycol  Assessment/ Plan:  61 y.o.hispanic  male ESRD on HD TTS, coronary artery disease, diabetes mellitus type 2, hyperlipidemia, hypertension, history of CVA, anemia chronic kidney disease, secondary hyperparathyroidism   [facility] TTS [address] Left AVF   1.  ESRD on HD TTS:      hemodialysis plan for today to get him back on usual schedule  2.  Anemia chronic kidney disease:  Hemoglobin 8.0  will continue EPO with HD  3.   Fever, hypotension, altered mental status -  in work up for sepsis - broad spectrum Abx -  check ammonia  4. 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 Alberteen Spindleline on 5/23.  5.  Congestive heart failure, ch systolc Echo EF 30-35%.noted on 07/29/16       LOS: 3 [facility],Kayven Aldaco 6/1/201812:54 PM  [facility] [address] [facility] [place], [place] [phone]

## 2016-07-31 NOTE — Progress Notes (Signed)
[facility].

## 2016-07-31 NOTE — Consult Note (Signed)
Consultation Note Date: 07/31/2016   Patient Name: Eddie Myers Myers  DOB: May 07, 1955  MRN: [id number]  Age / Sex: 61 y.o., male  PCP: System, Pcp Not In Referring Physician: Theodoro Grist, MD  Reason for Consultation: Establishing goals of care d/t recurrent stroke  HPI/Patient Profile: 61 y.o. male  with past medical history of stroke, hypertension, diabetes, coronary artery disease, end-stage renal disease on dialysis TTS, peripheral artery disease, recent amputation of left great toe and right second toe 07/22/16, admitted on 07/25/2016 with AMS and fever. Treating for infection of unknown source, continues with acute encephalopathy and found to have recurrent stroke. Palliative requesting to assist with GOC   Clinical Assessment and Goals of Care: I met today at Eddie Myers Myers bedside with interpretor, RN Eddie Myers Myers, and daughter Eddie Myers Myers. Dr. Doy Myers is just leaving room when I enter (unfortunately he has had another stroke in the setting of therapeutic INR). Introduced palliative medicine as supportive care to patients and families dealing with serious illness for support and guidance especially with difficult decisions and situations. Eddie Myers Myers is lethargic and does not engage in our conversation although he was present. I explained to daughter the severity of her father's illness and attempted to prepare her for difficult decisions that family faced with now. She understands current medical state and diagnoses including recurrent stroke, heart failure, and infection as well as chronic renal failure on dialysis.    Explained to Eddie Myers Myers that family should began to think what we should do if Eddie Myers Myers continues to decline or does not improve. We discussed code status and she plans discuss with her siblings. We also discussed depending on his progression that dialysis may not be able to be continued if he is too weak or  lethargic as a new baseline. She began to be very tearful at this point. I believe she understands the severity and that he is likely approaching EOL. Also concern for comorbitities (CHF) and recurrent stroke expressed. We discussed interventions in respect to QOL as well and what we would be working towards. We also briefly discussed feeding tube. Encouraged Eddie Myers Myers that it is okay to hope for the best but we also need to prepare for the worst.   [name], RN has offered to help her to discuss with her siblings when they arrive this evening (unfortunately I am unable to be here this evening when they will be present).   Eddie Myers Myers was tearful and I am sure overwhelmed. My goal today was to initiate the conversation and encourage further family conversation regarding Eddie Myers Myers.   Primary Decision Maker NEXT OF KIN Multiple children    SUMMARY OF RECOMMENDATIONS   - Encouraged discussion regarding code status and aggressiveness of care - Began to introduce concepts of EOL care and QOL  Code Status/Advance Care Planning:  Full code - family to discuss   Symptom Management:   Encephalopathy: Neuro following. Treating and working up for infection. Awaiting UA. May consider [facility] as well as aspiration could be occurring.   Agitation: Consider haldol 0.5-1 mg  prn if QTc can tolerate.   Palliative Prophylaxis:   Aspiration, Bowel Regimen, Delirium Protocol, Frequent Pain Assessment, Oral Care and Turn Reposition  Additional Recommendations (Limitations, Scope, Preferences):  Full Scope Treatment  Psycho-social/Spiritual:   Desire for further Chaplaincy support:no  Additional Recommendations: Caregiving  Support/Resources, Education on Hospice and Grief/Bereavement Support  Prognosis:   Prognosis very poor. If unable to continue dialysis he will be < 2 weeks and appropriate for hospice facility. I did NOT discuss hospice with family today.   Discharge Planning: To Be Determined       Primary Diagnoses: Present on Admission: . Encephalopathy acute . ESRD (end stage renal disease) ([place])   I have reviewed the medical record, interviewed the patient and family, and examined the patient. The following aspects are pertinent.  Past Medical History:  Diagnosis Date  . Anginal pain ([place])   . Coronary artery disease   . Diabetes mellitus without complication ([place])   . Dialysis patient ([place])    Tues, Thurs, Sat  . Dyspnea    with exertion  . Elevated lipids   . Enlarged prostate   . ESRD (end stage renal disease) ([place])    dialysis M-W-F  . Hypertension   . Myocardial infarction ([place]) 08/2015  . Renal insufficiency   . Stroke [facility])    Lt side this year July   Social History   Social History  . Marital status: Single    Spouse name: N/A  . Number of children: N/A  . Years of education: N/A   Social History Main Topics  . Smoking status: Former Smoker    Packs/day: 0.50    Years: 45.00    Quit date: 01/05/2015  . Smokeless tobacco: Never Used  . Alcohol use No  . Drug use: No  . Sexual activity: Not Asked   Other Topics Concern  . None   Social History Narrative  . None   Family History  Problem Relation Age of Onset  . Cancer Mother   . Diabetes Mother   . Diabetes Father    Scheduled Meds: . aspirin EC  81 mg Oral Daily  . insulin aspart  0-9 Units Subcutaneous Q4H  . polyethylene glycol  17 g Oral Daily  . sodium chloride flush  3 mL Intravenous Q12H   Continuous Infusions: . sodium chloride Stopped (07/18/2016 2316)  . sodium chloride    . sodium chloride    . dextrose 30 mL/hr at 07/29/16 1713  . piperacillin-tazobactam (ZOSYN)  IV Stopped (07/31/16 0300)   PRN Meds:.acetaminophen **OR** acetaminophen, albuterol, haloperidol lactate, ondansetron **OR** ondansetron (ZOFRAN) IV, polyethylene glycol No Known Allergies Review of Systems  Unable to perform ROS: Acuity of condition    Physical Exam  Constitutional: He appears  well-developed. He appears lethargic.  HENT:  Head: Normocephalic and atraumatic.  Cardiovascular: Normal rate and regular rhythm.   Pulmonary/Chest: Effort normal. No accessory muscle usage. No tachypnea. No respiratory distress.  Abdominal: Normal appearance.  Neurological: He appears lethargic.  Restless. He did return my greeting but that was his only verbalization while I was in room. Not following commands.   Nursing note and vitals reviewed.   Vital Signs: BP 127/64   Pulse 76   Temp 98.1 F (36.7 C) (Oral)   Resp 18   Ht 5' 8"  (1.727 m)   Wt 81 kg (178 lb 9.2 oz)   SpO2 100%   BMI 27.15 kg/m  Pain Assessment: No/denies pain   Pain Score: Asleep  SpO2: SpO2: 100 % O2 Device:SpO2: 100 % O2 Flow Rate: .O2 Flow Rate (L/min): 4 L/min  IO: Intake/output summary:  Intake/Output Summary (Last 24 hours) at 07/31/16 1136 Last data filed at 07/31/16 0900  Gross per 24 hour  Intake              480 ml  Output              500 ml  Net              -20 ml    LBM: Last BM Date: 07/30/16 Baseline Weight: Weight: 78 kg (171 lb 15.3 oz) Most recent weight: Weight: 81 kg (178 lb 9.2 oz)     Palliative Assessment/Data: PPS: 10-20%   Flowsheet Rows     Most Recent Value  Intake Tab  Referral Department  Hospitalist  Unit at Time of Referral  Med/Surg Unit  Palliative Care Primary Diagnosis  Nephrology  Date Notified  07/30/16  Palliative Care Type  New Palliative care  Reason for referral  Clarify Goals of Care  Date of Admission  07/15/2016  # of days IP prior to Palliative referral  2  Clinical Assessment  Psychosocial & Spiritual Assessment  Palliative Care Outcomes       Time Total: 29mn  Greater than 50%  of this time was spent counseling and coordinating care related to the above assessment and plan.  Signed by: AVinie Sill NP Palliative Medicine Team Pager # [phone](M-F 8a-5p) Team Phone # [phone](Nights/Weekends)

## 2016-07-31 NOTE — Progress Notes (Addendum)
Patient ID: Vonna DraftsMauro Myers, male   DOB: 05-05-1955, 61 y.o.   MRN: [id number]   Received a call from radiologist about the CT results, discussed with Dr. Karin GoldenShogry , who reported that patient developed severe subarachnoid hemorrhage and hemorrhage into right frontal lobe. This was discussed with Dr. Thad Rangereynolds, who recommended to initiate patient on anticoagulation reversal protocol with vitamin K, fresh frozen plasma derivatives, etc. We'll discuss this patient's family, their inclinations , specifically transfer to tertiary care center or palliative care in the hospital. Patient's nurse Sharia ReeveJosh was informed.   The  family decided on comfort care measures, patient is going to be initiated on a morphine, Ativan, all medications, hemodialysis is stopped, transfer to 1 C, care manager is consulted for hospice home placement, per patient's family request

## 2016-07-31 DEATH — deceased

## 2016-08-02 LAB — CULTURE, BLOOD (ROUTINE X 2)
Culture: NO GROWTH
Culture: NO GROWTH

## 2016-08-12 ENCOUNTER — Encounter (INDEPENDENT_AMBULATORY_CARE_PROVIDER_SITE_OTHER): Payer: Medicare Other

## 2016-08-12 ENCOUNTER — Ambulatory Visit (INDEPENDENT_AMBULATORY_CARE_PROVIDER_SITE_OTHER): Payer: Medicare Other | Admitting: Vascular Surgery

## 2016-08-30 NOTE — Progress Notes (Signed)
[facility] [place] [facility]   PATIENT NAME: Eddie Myers    MR#:  [id number]  DATE OF BIRTH:  [birth year]  SUBJECTIVE:   Pt. Found to have subarachnoid and Intraparenchymal cerebral hemorrhage on CT scan of the head yesterday. Patient is now under comfort care only. Family at bedside.  REVIEW OF SYSTEMS:    Review of Systems  Unable to perform ROS: Mental acuity    Nutrition: NPO Tolerating Diet: No  DRUG ALLERGIES:  No Known Allergies  VITALS:  Blood pressure (!) 156/78, pulse 61, temperature 99.5 F (37.5 C), temperature source Axillary, resp. rate (!) 21, height 5\' 8"  (1.727 m), weight 81 kg (178 lb 9.2 oz), SpO2 100 %.  PHYSICAL EXAMINATION:   Physical Exam  GENERAL:  61 y.o.-year-old patient lying in bed comfortable with some agonal respirations.   EYES:  No scleral icterus.  HEENT: Head atraumatic, normocephalic. Oropharynx and nasopharynx clear.  NECK:  Supple, no jugular venous distention. No thyroid enlargement, no tenderness.  LUNGS: Normal breath sounds bilaterally, no wheezing, rales, rhonchi. No use of accessory muscles of respiration.  CARDIOVASCULAR: S1, S2 normal, RRR. No murmurs, rubs, or gallops.  ABDOMEN: Soft, nontender, nondistended. Bowel sounds present. No organomegaly or mass.  EXTREMITIES: No cyanosis, clubbing or edema b/l.    NEUROLOGIC: Encephalopathic, sedated PSYCHIATRIC: Encephalopathic, sedated.  SKIN: No obvious rash, lesion, or ulcer.    LABORATORY PANEL:   CBC  Recent Labs Lab 07/31/16 0430  WBC 6.1  HGB 8.3*  HCT 24.6*  PLT 213   ------------------------------------------------------------------------------------------------------------------  Chemistries   Recent Labs Lab 07/29/16 0351 07/30/16 0325 07/30/16 1756  NA 130* 135  --   K 4.6 4.4  --   CL 91* 93*  --   CO2 26 32  --   GLUCOSE 88 110*  --   BUN 53* 27*  --   CREATININE 9.25* 5.71*  --   CALCIUM 8.0* 7.9*  --   MG  --   --   1.8  AST 28  --   --   ALT 18  --   --   ALKPHOS 230*  --   --   BILITOT 1.0  --   --    ------------------------------------------------------------------------------------------------------------------  Cardiac Enzymes  Recent Labs Lab Aug 26, 2016 2146  TROPONINI 0.45*   ------------------------------------------------------------------------------------------------------------------  RADIOLOGY:  Ct Head Wo Contrast  Result Date: 07/31/2016 CLINICAL DATA:  Altered mental status over the last few days. Fever. EXAM: CT HEAD WITHOUT CONTRAST TECHNIQUE: Contiguous axial images were obtained from the base of the skull through the vertex without intravenous contrast. COMPARISON:  MRI 07/30/2016.  CT Aug 26, 2016.  MRI 06/26/2016. FINDINGS: Brain: Since the previous study, the patient has developed subarachnoid hemorrhage around the base of the brain and into the left sylvian fissure. There is some layering intraventricular hemorrhage. There is also intraparenchymal hemorrhage in the right frontal region consistent with hemorrhage into an anterior cerebral infarction. There may be progressive deep brain infarction in the thalami, basal ganglia and deep white matter. Ventricular size could be slightly more prominent suggesting the possibility of developing hydrocephalus. The presence of this acute subarachnoid hemorrhage in the previous setting of developing deep brain infarctions suggests the possibility of vasculitis or meningitis. Aneurysmal hemorrhage is not excluded. Vascular: There is atherosclerotic calcification of the major vessels at the base of the brain. Skull: Normal Sinuses/Orbits: Clear/normal Other: None significant IMPRESSION: The patient has developed subarachnoid hemorrhage around the base of the brain, extending into the  left sylvian fissure. There is also intraparenchymal hemorrhage in the right frontal lobe. The right frontal hemorrhage looks like hemorrhagic transformation of a right  frontal infarction, much larger than was demonstrated on the MRI scan of 24 hours ago. The hemorrhage at the base of the brain, in the setting of the deep brain infarctions demonstrated yesterday, could be secondary to vasculitis or meningitis with vasculitis. Aneurysmal hemorrhage is not excluded at this time but probably not the most likely explanation. Critical Value/emergent results were called by telephone at the time of interpretation on 07/31/2016 at 5:20 pm to Dr. Katharina Caper , who verbally acknowledged these results. Electronically Signed   By: Paulina Fusi M.D.   On: 07/31/2016 17:28   Ct Chest W Contrast  Result Date: 07/30/2016 CLINICAL DATA:  61 year old male with fever, hypotension, sepsis. End-stage renal disease on dialysis. Status post right femoral vascular surgery earlier this month. EXAM: CT CHEST, ABDOMEN, AND PELVIS WITH CONTRAST TECHNIQUE: Multidetector CT imaging of the chest, abdomen and pelvis was performed following the standard protocol during bolus administration of intravenous contrast. CONTRAST:  ISOVUE-300 IOPAMIDOL (ISOVUE-300) INJECTION 61% COMPARISON:  Pelvis CT with contrast 07/07/2016. Chest radiographs 08-27-16 and earlier. FINDINGS: CT CHEST FINDINGS Cardiovascular: Mild cardiomegaly. Calcified coronary artery and aortic atherosclerosis. Left subclavian vascular stent. Suboptimal intravascular contrast bolus, such that great vessel patency is not well evaluated. Mediastinum/Nodes: There are number of small calcified mediastinal and hilar lymph nodes. Superimposed larger anterior carina lymph nodes up to 17 mm short axis. Lungs/Pleura: Major airways are patent aside from atelectatic changes at both hila. Mild diffuse pulmonary septal thickening. Intermittent right upper lobe subpleural and centrilobular nodularity is superimposed. No similar nodularity in the left lower lobe. Confluent mostly dependent opacity in the lingula and both lower lobes. Superimposed small  layering pleural effusions. Superimposed calcified granuloma in the right lower lobe. Musculoskeletal: Prior sternotomy. No acute osseous abnormality identified. CT ABDOMEN PELVIS FINDINGS Hepatobiliary: Small volume nonspecific perihepatic fluid. Negative liver. No definite gallbladder inflammation. No biliary ductal enlargement identified. Pancreas: Negative. Spleen: Negative except for a small volume of perisplenic fluid. Adrenals/Urinary Tract: Normal adrenal glands. Poor bilateral renal enhancement and no significant renal contrast excretion on delayed images. Bilateral renal artery calcified atherosclerosis. Nonspecific perinephric stranding. No suspicious renal lesion. Mildly distended and otherwise unremarkable urinary bladder. Stomach/Bowel: Negative rectum. Negative sigmoid colon aside from retained stool. Negative left colon and transverse colon aside from retained stool. Small volume of fluid in the right gutter with mild right colonic wall thickening. The appendix appears stable. Oral contrast has just reached the ileocecal valve. No dilated small bowel. Decompressed stomach. Negative duodenum. Small volume upper abdominal fluid including along the inferior liver margin and about the spleen. No pneumoperitoneum. Vascular/Lymphatic: Extensive calcified atherosclerosis throughout the abdomen and pelvis including the aorta, and iliac, and femoral arteries suboptimal intravascular contrast bolus such that vascular patency is not well evaluated. There is a 2.6 cm hematoma or pseudoaneurysm at the bifurcation of the right common femoral artery as seen on series 2, image 128 which is new since 07/07/2016. There is also a 3 cm simple fluid density collection superficial to the right common femoral artery now (series 2, image 118). There were postoperative changes with a small volume of soft tissue gas at both of these locations on the prior study. No lymphadenopathy in the abdomen or pelvis. Reproductive:  Negative. Other: Small volume simple density pelvic free fluid. Musculoskeletal: Mild grade 1 anterolisthesis of L4 on L5. No acute osseous  abnormality identified. IMPRESSION: 1. There is a 2.6 cm hematoma versus pseudoaneurysm at the bifurcation of the right common femoral artery in the right inguinal region which is new compared to 07/07/2016. Recommend further evaluation with vascular ultrasound. Superimposed superficial 3 cm probable liquefying hematoma superficial to the right CFA. 2. Small bilateral layering pleural effusions. Nonspecific small volume of free fluid in the abdomen and pelvis. 3. Combination of pulmonary septal thickening with mild right upper lobe nodularity, lingula and bilateral lower lobe dependent and peribronchial opacity. Differential considerations include Pulmonary Edema with atelectasis versus Viral/atypical Respiratory Infection. 4. Underlying remote thoracic granulomatous disease. Superimposed reactive appearing mediastinal lymphadenopathy felt related to #3. 5. Up to mild wall thickening of the right colon could be reactive due to the nearby free fluid in the right gutter or reflect mild focal colitis. No other bowel inflammation. 6. Cardiomegaly and extensive calcified atherosclerosis throughout the chest abdomen and pelvis. Suboptimal intravascular contrast bolus such that vascular patency is not well evaluated on this study. Electronically Signed   By: Odessa Fleming M.D.   On: 07/30/2016 14:37   Mr Brain Wo Contrast  Result Date: 07/30/2016 CLINICAL DATA:  Initial evaluation for acute confusion. EXAM: MRI HEAD WITHOUT CONTRAST TECHNIQUE: Multiplanar, multiecho pulse sequences of the brain and surrounding structures were obtained without intravenous contrast. COMPARISON:  Comparison made with prior CT from 07/30/2016 as well as recent MRI from 06/26/2016. FINDINGS: Brain: Study fairly advanced by motion artifact. Generalized age-related cerebral atrophy again noted. Mild for age  chronic microvascular ischemic changes present within the periventricular and deep white matter both cerebral hemispheres. Multiple remote lacunar infarcts seen within the bilateral thalami. Remote infarct noted within the inferior left cerebellar hemisphere. There is 13 mm focus of abnormal restricted diffusion at the central aspect of the splenium of the corpus callosum (series 4, image 28). Slight extension to the left. Additional punctate 5 mm focus of diffusion abnormality at the left globus pallidus (series 4, image 27). Additional tiny focus of diffusion abnormality along the body of the corpus callosum (series 4, image 28). Few additional punctate foci of diffusion abnormality within mean periventricular cerebral white matter (series 4, image 26 on the right, series 7, image 24 on the left). Findings are suspected to be ischemic in nature, likely embolic given the various vascular distributions involved. While other entities including seizure and toxic metabolic derangement are associated with cytotoxic lesions of the splenium, the presence of the additional foci would perhaps favor an ischemic etiology. No associated hemorrhage or mass effect. No other evidence for acute or subacute ischemia. Gray-white matter differentiation otherwise grossly maintained. No evidence for acute or chronic intracranial hemorrhage. No mass lesion, midline shift or mass effect. Ventricles normal size without evidence for hydrocephalus. No extra-axial fluid collection. Major dural sinuses are grossly patent. Pituitary gland grossly unremarkable. Vascular: Major intracranial vascular flow voids are grossly maintained. Skull and upper cervical spine: Craniocervical junction unremarkable. Visualized upper cervical spine grossly unremarkable. Bone marrow signal intensity within normal limits. No scalp soft tissue abnormality. Sinuses/Orbits: Globes and orbital soft tissues grossly unremarkable. Paranasal sinuses are largely clear.  No mastoid effusion. Other: None. IMPRESSION: 1. Patchy abnormal restricted diffusion involving the splenium/corpus callosum, left basal ganglia, and periventricular white matter as above. Findings favored to be ischemic in nature, likely embolic. While other entities including seizure and toxic metabolic derangement are also associated with cytotoxic lesions of the splenium, the presence of the additional foci would perhaps favor an ischemic etiology. 2. Otherwise stable  grossly stable appearance of the brain on this motion degraded exam. Stable atrophy with mild chronic small vessel ischemic disease. Remote bilateral thalamic lacunar infarcts, with remote left cerebellar infarct. Electronically Signed   By: Rise Mu M.D.   On: 07/30/2016 15:26   Ct Abdomen Pelvis W Contrast  Result Date: 07/30/2016 CLINICAL DATA:  61 year old male with fever, hypotension, sepsis. End-stage renal disease on dialysis. Status post right femoral vascular surgery earlier this month. EXAM: CT CHEST, ABDOMEN, AND PELVIS WITH CONTRAST TECHNIQUE: Multidetector CT imaging of the chest, abdomen and pelvis was performed following the standard protocol during bolus administration of intravenous contrast. CONTRAST:  ISOVUE-300 IOPAMIDOL (ISOVUE-300) INJECTION 61% COMPARISON:  Pelvis CT with contrast 07/07/2016. Chest radiographs August 13, 2016 and earlier. FINDINGS: CT CHEST FINDINGS Cardiovascular: Mild cardiomegaly. Calcified coronary artery and aortic atherosclerosis. Left subclavian vascular stent. Suboptimal intravascular contrast bolus, such that great vessel patency is not well evaluated. Mediastinum/Nodes: There are number of small calcified mediastinal and hilar lymph nodes. Superimposed larger anterior carina lymph nodes up to 17 mm short axis. Lungs/Pleura: Major airways are patent aside from atelectatic changes at both hila. Mild diffuse pulmonary septal thickening. Intermittent right upper lobe subpleural and  centrilobular nodularity is superimposed. No similar nodularity in the left lower lobe. Confluent mostly dependent opacity in the lingula and both lower lobes. Superimposed small layering pleural effusions. Superimposed calcified granuloma in the right lower lobe. Musculoskeletal: Prior sternotomy. No acute osseous abnormality identified. CT ABDOMEN PELVIS FINDINGS Hepatobiliary: Small volume nonspecific perihepatic fluid. Negative liver. No definite gallbladder inflammation. No biliary ductal enlargement identified. Pancreas: Negative. Spleen: Negative except for a small volume of perisplenic fluid. Adrenals/Urinary Tract: Normal adrenal glands. Poor bilateral renal enhancement and no significant renal contrast excretion on delayed images. Bilateral renal artery calcified atherosclerosis. Nonspecific perinephric stranding. No suspicious renal lesion. Mildly distended and otherwise unremarkable urinary bladder. Stomach/Bowel: Negative rectum. Negative sigmoid colon aside from retained stool. Negative left colon and transverse colon aside from retained stool. Small volume of fluid in the right gutter with mild right colonic wall thickening. The appendix appears stable. Oral contrast has just reached the ileocecal valve. No dilated small bowel. Decompressed stomach. Negative duodenum. Small volume upper abdominal fluid including along the inferior liver margin and about the spleen. No pneumoperitoneum. Vascular/Lymphatic: Extensive calcified atherosclerosis throughout the abdomen and pelvis including the aorta, and iliac, and femoral arteries suboptimal intravascular contrast bolus such that vascular patency is not well evaluated. There is a 2.6 cm hematoma or pseudoaneurysm at the bifurcation of the right common femoral artery as seen on series 2, image 128 which is new since 07/07/2016. There is also a 3 cm simple fluid density collection superficial to the right common femoral artery now (series 2, image 118).  There were postoperative changes with a small volume of soft tissue gas at both of these locations on the prior study. No lymphadenopathy in the abdomen or pelvis. Reproductive: Negative. Other: Small volume simple density pelvic free fluid. Musculoskeletal: Mild grade 1 anterolisthesis of L4 on L5. No acute osseous abnormality identified. IMPRESSION: 1. There is a 2.6 cm hematoma versus pseudoaneurysm at the bifurcation of the right common femoral artery in the right inguinal region which is new compared to 07/07/2016. Recommend further evaluation with vascular ultrasound. Superimposed superficial 3 cm probable liquefying hematoma superficial to the right CFA. 2. Small bilateral layering pleural effusions. Nonspecific small volume of free fluid in the abdomen and pelvis. 3. Combination of pulmonary septal thickening with mild right upper lobe  nodularity, lingula and bilateral lower lobe dependent and peribronchial opacity. Differential considerations include Pulmonary Edema with atelectasis versus Viral/atypical Respiratory Infection. 4. Underlying remote thoracic granulomatous disease. Superimposed reactive appearing mediastinal lymphadenopathy felt related to #3. 5. Up to mild wall thickening of the right colon could be reactive due to the nearby free fluid in the right gutter or reflect mild focal colitis. No other bowel inflammation. 6. Cardiomegaly and extensive calcified atherosclerosis throughout the chest abdomen and pelvis. Suboptimal intravascular contrast bolus such that vascular patency is not well evaluated on this study. Electronically Signed   By: Odessa Fleming M.D.   On: 07/30/2016 14:37   US Carotid Bilateral  Result Date: 07/31/2016 CLINICAL DATA:  Cerebral infarction. History of hypertension, coronary artery disease and diabetes. EXAM: BILATERAL CAROTID DUPLEX ULTRASOUND TECHNIQUE: Wallace Cullens scale imaging, color Doppler and duplex ultrasound were performed of bilateral carotid and vertebral arteries in  the neck. COMPARISON:  None. FINDINGS: Criteria: Quantification of carotid stenosis is based on velocity parameters that correlate the residual internal carotid diameter with NASCET-based stenosis levels, using the diameter of the distal internal carotid lumen as the denominator for stenosis measurement. The following velocity measurements were obtained: RIGHT ICA:  145/39 cm/sec CCA:  69/7 cm/sec SYSTOLIC ICA/CCA RATIO:  2.1 DIASTOLIC ICA/CCA RATIO:  5.6 ECA:  127 cm/sec LEFT ICA:  122/29 cm/sec CCA:  63/10 cm/sec SYSTOLIC ICA/CCA RATIO:  1.9 DIASTOLIC ICA/CCA RATIO:  2.9 ECA:  157 cm/sec RIGHT CAROTID ARTERY: There is a fairly mild amounts of partially calcified plaque present at the level of the proximal right ICA. Velocities do show some elevation, however and no tortuosity is present. Strict categorization of estimated right ICA stenosis is 50- 69%. Stenosis is likely closer to 50% based on grayscale appearance. RIGHT VERTEBRAL ARTERY: Antegrade flow with normal waveform and velocity. LEFT CAROTID ARTERY: Minimal plaque is present in the proximal left ICA. Estimated left ICA stenosis is less than 50%. LEFT VERTEBRAL ARTERY: Antegrade flow with normal waveform and velocity. IMPRESSION: Mild amount of plaque in both proximal internal carotid arteries. Due to some velocity elevation, categorization of right ICA stenosis is 50- 69% (stenosis likely closer to 50%). Estimated left ICA stenosis is less than 50%. Electronically Signed   By: Irish Lack M.D.   On: 07/31/2016 10:48   [place] Lower Ext Art Right Ltd  Result Date: 07/31/2016 CLINICAL DATA:  61 year old male with a history of right common femoral artery hematoma. EXAM: UNILATERAL RIGHT LOWER EXTREMITY ARTERIAL DUPLEX SCAN TECHNIQUE: Gray-scale sonography as well as color Doppler and duplex ultrasound was performed to evaluate the right lower extremity COMPARISON:  CT 07/30/2016 FINDINGS: Grayscale and color directed duplex imaging of the right lower  extremity for evaluation of right inguinal hematoma. Ill-defined anechoic fluid collection in the right inguinal region measuring 4.7 cm x 1.8 cm x 3.2 cm. This overlies the femoral vasculature. Duplex imaging demonstrates swirling blood flow communicating with the fluid collection adjacent to the common femoral artery with neck measuring 4 mm at the common femoral artery. 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 Signed, Yvone Neu. Loreta Ave, DO [facility] Electronically Signed   By: Gilmer Mor D.O.   On: 07/31/2016 10:57     ASSESSMENT AND PLAN:   61 year old male with past medical history of previous CVA, peripheral asked disease, paroxysmal atrial fibrillation, end-stage renal disease hemodialysis, diabetes, chronic systolic CHF who presented  to the hospital due to altered mental status and fever.  1. Acute Embolic CVA 2. Subarachnoid & Intraparenchymal cerebral bleed 3. Sepsis 4. ESRD on HD 5. Acute blood loss anemia.  6. Paroxysmal a. Fib 7. PVD/PAD 8. Diabetes.   Due to CT head findings yesterday with subarachnoid hemorrhage with intraparenchymal bleed and worsening mental status pt. Was made COMFORT CARE ONLY.   - cont. PRN Morphine, ATivan - cont. Glycopyrrolate   All the records are reviewed and case discussed with Care Management/Social Worker. Management plans discussed with the patient, family and they are in agreement.  CODE STATUS: DNR  DVT Prophylaxis: None  TOTAL TIME TAKING CARE OF THIS PATIENT: 25 minutes.   POSSIBLE D/C unclear DAYS, DEPENDING ON CLINICAL CONDITION.   [place] SirenSAINANI,Anders Hohmann J M.D on 07/31/2016 at 1:31 PM  Between 7am to 6pm - Pager - [phone]  After 6pm go to [email or web address] - Social research officer, governmentpassword [email or web address] [email or web address]  [facility] [place] [facility]  Office  [phone]  CC: Primary care  physician; System, Pcp Not In

## 2016-08-30 NOTE — Progress Notes (Signed)
Family at bedside during shift report. [facility] signed by Veverly FellsSon [name] Ladavion. Post mortem care rendered. [facility]. Needs attended. All question from family members were answered.

## 2016-08-30 NOTE — Consult Note (Signed)
[facility] paged to attend to family for spiritual, emotional and grief support.

## 2016-08-30 NOTE — Death Summary Note (Signed)
DEATH SUMMARY   Patient Details  Name: Eddie Myers MRN: [id number] DOB: 1955-09-16  Admission/Discharge Information   Admit Date:  07/10/2016  Date of Death: Date of Death: 20-Sep-2016  Time of Death: Time of Death: 1447  Length of Stay: 4  Referring Physician: System, Pcp Not In   Reason(s) for Hospitalization  Altered Mental Status/Embolic CVA  Diagnoses  Preliminary cause of death:  Secondary Diagnoses (including complications and co-morbidities):  Active Problems:   Encephalopathy acute   ESRD (end stage renal disease) [facility](HCC)   Palliative care encounter   Brief Hospital Course (including significant findings, care, treatment, and services provided and events leading to death)  Eddie Myers is a 61 y.o. year old male with past medical history of previous CVA, peripheral asked disease, paroxysmal atrial fibrillation, end-stage renal disease hemodialysis, diabetes, chronic systolic CHF who presented to the hospital due to altered mental status and fever.  1. Acute Embolic CVA 2. Subarachnoid & Intraparenchymal cerebral bleed 3. Sepsis 4. ESRD on HD 5. Acute blood loss anemia.  6. Paroxysmal a. Fib 7. PVD/PAD 8. Diabetes.   Patient's mental status was not improving. CT scan of the head done on 07/31/2016 revealed subarachnoid hemorrhage with intraparenchymal bleeding. Patient was made comfort care only and started on as needed Ativan and morphine and also glycopyrrolate.  Patient passed away on the afternoon of 11-25-16.   Pertinent Labs and Studies  Significant Diagnostic Studies Ct Head Wo Contrast  Result Date: 07/31/2016 CLINICAL DATA:  Altered mental status over the last few days. Fever. EXAM: CT HEAD WITHOUT CONTRAST TECHNIQUE: Contiguous axial images were obtained from the base of the skull through the vertex without intravenous contrast. COMPARISON:  MRI 07/30/2016.  CT 07/24/2016.  MRI 06/26/2016. FINDINGS: Brain: Since the previous study, the patient has  developed subarachnoid hemorrhage around the base of the brain and into the left sylvian fissure. There is some layering intraventricular hemorrhage. There is also intraparenchymal hemorrhage in the right frontal region consistent with hemorrhage into an anterior cerebral infarction. There may be progressive deep brain infarction in the thalami, basal ganglia and deep white matter. Ventricular size could be slightly more prominent suggesting the possibility of developing hydrocephalus. The presence of this acute subarachnoid hemorrhage in the previous setting of developing deep brain infarctions suggests the possibility of vasculitis or meningitis. Aneurysmal hemorrhage is not excluded. Vascular: There is atherosclerotic calcification of the major vessels at the base of the brain. Skull: Normal Sinuses/Orbits: Clear/normal Other: None significant IMPRESSION: The patient has developed subarachnoid hemorrhage around the base of the brain, extending into the left sylvian fissure. There is also intraparenchymal hemorrhage in the right frontal lobe. The right frontal hemorrhage looks like hemorrhagic transformation of a right frontal infarction, much larger than was demonstrated on the MRI scan of 24 hours ago. The hemorrhage at the base of the brain, in the setting of the deep brain infarctions demonstrated yesterday, could be secondary to vasculitis or meningitis with vasculitis. Aneurysmal hemorrhage is not excluded at this time but probably not the most likely explanation. Critical Value/emergent results were called by telephone at the time of interpretation on 07/31/2016 at 5:20 pm to Dr. Katharina CaperIMA VAICKUTE , who verbally acknowledged these results. Electronically Signed   By: Paulina FusiMark  Shogry M.D.   On: 07/31/2016 17:28   Ct Head Wo Contrast  Result Date: 07/12/2016 CLINICAL DATA:  Altered mental status.  Unresponsive. EXAM: CT HEAD WITHOUT CONTRAST TECHNIQUE: Contiguous axial images were obtained from the base of the  skull  through the vertex without intravenous contrast. COMPARISON:  Brain MRI 06/26/2016 FINDINGS: Brain: There is no evidence of acute infarct, intracranial hemorrhage, mass, midline shift, or extra-axial fluid collection. The ventricles and sulci are within normal limits for age. Cerebral white matter hypodensities are unchanged and nonspecific but compatible with mild chronic small vessel ischemic disease. Chronic lacunar infarcts are again seen in the posterior limb of the right internal capsule, bilateral thalami, and left cerebellum. Punctate superficial cortical or extra-axial calcifications are noted in the right parietal and left frontal lobes Vascular: Calcified atherosclerosis at the skullbase. No hyperdense vessel. Skull: No fracture or focal osseous lesion. Sinuses/Orbits: Visualized paranasal sinuses and mastoid air cells are clear. Orbits are unremarkable. Other: None. IMPRESSION: 1. No evidence of acute intracranial abnormality. 2. Chronic small vessel ischemia with chronic lacunar infarcts as above. Electronically Signed   By: Sebastian Ache M.D.   On: 07/10/2016 12:45   Ct Chest W Contrast  Result Date: 07/30/2016 CLINICAL DATA:  61 year old male with fever, hypotension, sepsis. End-stage renal disease on dialysis. Status post right femoral vascular surgery earlier this month. EXAM: CT CHEST, ABDOMEN, AND PELVIS WITH CONTRAST TECHNIQUE: Multidetector CT imaging of the chest, abdomen and pelvis was performed following the standard protocol during bolus administration of intravenous contrast. CONTRAST:  ISOVUE-300 IOPAMIDOL (ISOVUE-300) INJECTION 61% COMPARISON:  Pelvis CT with contrast 07/07/2016. Chest radiographs 07/15/2016 and earlier. FINDINGS: CT CHEST FINDINGS Cardiovascular: Mild cardiomegaly. Calcified coronary artery and aortic atherosclerosis. Left subclavian vascular stent. Suboptimal intravascular contrast bolus, such that great vessel patency is not well evaluated.  Mediastinum/Nodes: There are number of small calcified mediastinal and hilar lymph nodes. Superimposed larger anterior carina lymph nodes up to 17 mm short axis. Lungs/Pleura: Major airways are patent aside from atelectatic changes at both hila. Mild diffuse pulmonary septal thickening. Intermittent right upper lobe subpleural and centrilobular nodularity is superimposed. No similar nodularity in the left lower lobe. Confluent mostly dependent opacity in the lingula and both lower lobes. Superimposed small layering pleural effusions. Superimposed calcified granuloma in the right lower lobe. Musculoskeletal: Prior sternotomy. No acute osseous abnormality identified. CT ABDOMEN PELVIS FINDINGS Hepatobiliary: Small volume nonspecific perihepatic fluid. Negative liver. No definite gallbladder inflammation. No biliary ductal enlargement identified. Pancreas: Negative. Spleen: Negative except for a small volume of perisplenic fluid. Adrenals/Urinary Tract: Normal adrenal glands. Poor bilateral renal enhancement and no significant renal contrast excretion on delayed images. Bilateral renal artery calcified atherosclerosis. Nonspecific perinephric stranding. No suspicious renal lesion. Mildly distended and otherwise unremarkable urinary bladder. Stomach/Bowel: Negative rectum. Negative sigmoid colon aside from retained stool. Negative left colon and transverse colon aside from retained stool. Small volume of fluid in the right gutter with mild right colonic wall thickening. The appendix appears stable. Oral contrast has just reached the ileocecal valve. No dilated small bowel. Decompressed stomach. Negative duodenum. Small volume upper abdominal fluid including along the inferior liver margin and about the spleen. No pneumoperitoneum. Vascular/Lymphatic: Extensive calcified atherosclerosis throughout the abdomen and pelvis including the aorta, and iliac, and femoral arteries suboptimal intravascular contrast bolus such that  vascular patency is not well evaluated. There is a 2.6 cm hematoma or pseudoaneurysm at the bifurcation of the right common femoral artery as seen on series 2, image 128 which is new since 07/07/2016. There is also a 3 cm simple fluid density collection superficial to the right common femoral artery now (series 2, image 118). There were postoperative changes with a small volume of soft tissue gas at both of these locations  on the prior study. No lymphadenopathy in the abdomen or pelvis. Reproductive: Negative. Other: Small volume simple density pelvic free fluid. Musculoskeletal: Mild grade 1 anterolisthesis of L4 on L5. No acute osseous abnormality identified. IMPRESSION: 1. There is a 2.6 cm hematoma versus pseudoaneurysm at the bifurcation of the right common femoral artery in the right inguinal region which is new compared to 07/07/2016. Recommend further evaluation with vascular ultrasound. Superimposed superficial 3 cm probable liquefying hematoma superficial to the right CFA. 2. Small bilateral layering pleural effusions. Nonspecific small volume of free fluid in the abdomen and pelvis. 3. Combination of pulmonary septal thickening with mild right upper lobe nodularity, lingula and bilateral lower lobe dependent and peribronchial opacity. Differential considerations include Pulmonary Edema with atelectasis versus Viral/atypical Respiratory Infection. 4. Underlying remote thoracic granulomatous disease. Superimposed reactive appearing mediastinal lymphadenopathy felt related to #3. 5. Up to mild wall thickening of the right colon could be reactive due to the nearby free fluid in the right gutter or reflect mild focal colitis. No other bowel inflammation. 6. Cardiomegaly and extensive calcified atherosclerosis throughout the chest abdomen and pelvis. Suboptimal intravascular contrast bolus such that vascular patency is not well evaluated on this study. Electronically Signed   By: Odessa Fleming M.D.   On: 07/30/2016  14:37   Ct Pelvis W Contrast  Result Date: 07/07/2016 CLINICAL DATA:  Severe right groin pain. Drop in hematocrit and hemoglobin. Status post right common femoral, profunda femoris and superficial femoral artery endarterectomies with separate core matrix patch angioplasty of the common and superficial femoral arteries and second patchy angioplasty of the profunda femoris artery on 07/03/2016. Additional angioplasties of the right popliteal and right posterior tibial arteries. EXAM: CT PELVIS AND BILATERAL LOWER EXTREMITIES WITH CONTRAST TECHNIQUE: Multidetector CT imaging of the pelvis and bilateral lower extremities was performed according to the standard protocol following bolus administration of intravenous contrast. Multiplanar CT image reconstructions were also generated. CONTRAST:  ISOVUE-300 IOPAMIDOL (ISOVUE-300) INJECTION 61% COMPARISON:  None. FINDINGS: CT PELVIS FINDINGS Urinary Tract:  Unremarkable urinary bladder and distal ureters. Bowel:  Unremarkable. Vascular/Lymphatic: Atheromatous arterial calcifications and plaques. Reproductive:  Normal sized prostate gland. Other: Small bilateral inguinal hernias containing fat. Mild right groin. Vascular and subcutaneous edema and postoperative air, extending superiorly and inferiorly. No discrete abscess or hematoma seen. Musculoskeletal: Mild lower lumbar spine degenerative changes. Left iliac bone cyst. CT LEFT/RIGHT LOWER EXTREMITY FINDINGS Bones/Joint/Cartilage Unremarkable. Ligaments Suboptimally assessed by CT. Muscles and Tendons Mild low density in the lateral aspect of the proximal adductor longus muscle on the right. No significant enlargement of the masses Soft tissues Mild diffuse subcutaneous edema anteriorly, laterally and medially in the proximal right thigh. Vascular: Multifocal arterial atheromatous calcifications and plaques. IMPRESSION: 1. Right groin postoperative changes with mild associated soft tissue edema and air, including  mild edema in the lateral aspect of the proximal adductor longus muscle on the right. No visible hematoma. 2. Extensive bilateral arterial atheromatous changes. 3. Small bilateral inguinal hernias containing fat. Electronically Signed   By: Beckie Salts M.D.   On: 07/07/2016 13:28   Mr Brain Wo Contrast  Result Date: 07/30/2016 CLINICAL DATA:  Initial evaluation for acute confusion. EXAM: MRI HEAD WITHOUT CONTRAST TECHNIQUE: Multiplanar, multiecho pulse sequences of the brain and surrounding structures were obtained without intravenous contrast. COMPARISON:  Comparison made with prior CT from August 18, 2016 as well as recent MRI from 06/26/2016. FINDINGS: Brain: Study fairly advanced by motion artifact. Generalized age-related cerebral atrophy again noted. Mild for age chronic  microvascular ischemic changes present within the periventricular and deep white matter both cerebral hemispheres. Multiple remote lacunar infarcts seen within the bilateral thalami. Remote infarct noted within the inferior left cerebellar hemisphere. There is 13 mm focus of abnormal restricted diffusion at the central aspect of the splenium of the corpus callosum (series 4, image 28). Slight extension to the left. Additional punctate 5 mm focus of diffusion abnormality at the left globus pallidus (series 4, image 27). Additional tiny focus of diffusion abnormality along the body of the corpus callosum (series 4, image 28). Few additional punctate foci of diffusion abnormality within mean periventricular cerebral white matter (series 4, image 26 on the right, series 7, image 24 on the left). Findings are suspected to be ischemic in nature, likely embolic given the various vascular distributions involved. While other entities including seizure and toxic metabolic derangement are associated with cytotoxic lesions of the splenium, the presence of the additional foci would perhaps favor an ischemic etiology. No associated hemorrhage or mass  effect. No other evidence for acute or subacute ischemia. Gray-white matter differentiation otherwise grossly maintained. No evidence for acute or chronic intracranial hemorrhage. No mass lesion, midline shift or mass effect. Ventricles normal size without evidence for hydrocephalus. No extra-axial fluid collection. Major dural sinuses are grossly patent. Pituitary gland grossly unremarkable. Vascular: Major intracranial vascular flow voids are grossly maintained. Skull and upper cervical spine: Craniocervical junction unremarkable. Visualized upper cervical spine grossly unremarkable. Bone marrow signal intensity within normal limits. No scalp soft tissue abnormality. Sinuses/Orbits: Globes and orbital soft tissues grossly unremarkable. Paranasal sinuses are largely clear. No mastoid effusion. Other: None. IMPRESSION: 1. Patchy abnormal restricted diffusion involving the splenium/corpus callosum, left basal ganglia, and periventricular white matter as above. Findings favored to be ischemic in nature, likely embolic. While other entities including seizure and toxic metabolic derangement are also associated with cytotoxic lesions of the splenium, the presence of the additional foci would perhaps favor an ischemic etiology. 2. Otherwise stable grossly stable appearance of the brain on this motion degraded exam. Stable atrophy with mild chronic small vessel ischemic disease. Remote bilateral thalamic lacunar infarcts, with remote left cerebellar infarct. Electronically Signed   By: Rise Mu M.D.   On: 07/30/2016 15:26   Ct Abdomen Pelvis W Contrast  Result Date: 07/30/2016 CLINICAL DATA:  61 year old male with fever, hypotension, sepsis. End-stage renal disease on dialysis. Status post right femoral vascular surgery earlier this month. EXAM: CT CHEST, ABDOMEN, AND PELVIS WITH CONTRAST TECHNIQUE: Multidetector CT imaging of the chest, abdomen and pelvis was performed following the standard protocol  during bolus administration of intravenous contrast. CONTRAST:  ISOVUE-300 IOPAMIDOL (ISOVUE-300) INJECTION 61% COMPARISON:  Pelvis CT with contrast 07/07/2016. Chest radiographs 07/24/2016 and earlier. FINDINGS: CT CHEST FINDINGS Cardiovascular: Mild cardiomegaly. Calcified coronary artery and aortic atherosclerosis. Left subclavian vascular stent. Suboptimal intravascular contrast bolus, such that great vessel patency is not well evaluated. Mediastinum/Nodes: There are number of small calcified mediastinal and hilar lymph nodes. Superimposed larger anterior carina lymph nodes up to 17 mm short axis. Lungs/Pleura: Major airways are patent aside from atelectatic changes at both hila. Mild diffuse pulmonary septal thickening. Intermittent right upper lobe subpleural and centrilobular nodularity is superimposed. No similar nodularity in the left lower lobe. Confluent mostly dependent opacity in the lingula and both lower lobes. Superimposed small layering pleural effusions. Superimposed calcified granuloma in the right lower lobe. Musculoskeletal: Prior sternotomy. No acute osseous abnormality identified. CT ABDOMEN PELVIS FINDINGS Hepatobiliary: Small volume nonspecific perihepatic fluid. Negative  liver. No definite gallbladder inflammation. No biliary ductal enlargement identified. Pancreas: Negative. Spleen: Negative except for a small volume of perisplenic fluid. Adrenals/Urinary Tract: Normal adrenal glands. Poor bilateral renal enhancement and no significant renal contrast excretion on delayed images. Bilateral renal artery calcified atherosclerosis. Nonspecific perinephric stranding. No suspicious renal lesion. Mildly distended and otherwise unremarkable urinary bladder. Stomach/Bowel: Negative rectum. Negative sigmoid colon aside from retained stool. Negative left colon and transverse colon aside from retained stool. Small volume of fluid in the right gutter with mild right colonic wall thickening. The  appendix appears stable. Oral contrast has just reached the ileocecal valve. No dilated small bowel. Decompressed stomach. Negative duodenum. Small volume upper abdominal fluid including along the inferior liver margin and about the spleen. No pneumoperitoneum. Vascular/Lymphatic: Extensive calcified atherosclerosis throughout the abdomen and pelvis including the aorta, and iliac, and femoral arteries suboptimal intravascular contrast bolus such that vascular patency is not well evaluated. There is a 2.6 cm hematoma or pseudoaneurysm at the bifurcation of the right common femoral artery as seen on series 2, image 128 which is new since 07/07/2016. There is also a 3 cm simple fluid density collection superficial to the right common femoral artery now (series 2, image 118). There were postoperative changes with a small volume of soft tissue gas at both of these locations on the prior study. No lymphadenopathy in the abdomen or pelvis. Reproductive: Negative. Other: Small volume simple density pelvic free fluid. Musculoskeletal: Mild grade 1 anterolisthesis of L4 on L5. No acute osseous abnormality identified. IMPRESSION: 1. There is a 2.6 cm hematoma versus pseudoaneurysm at the bifurcation of the right common femoral artery in the right inguinal region which is new compared to 07/07/2016. Recommend further evaluation with vascular ultrasound. Superimposed superficial 3 cm probable liquefying hematoma superficial to the right CFA. 2. Small bilateral layering pleural effusions. Nonspecific small volume of free fluid in the abdomen and pelvis. 3. Combination of pulmonary septal thickening with mild right upper lobe nodularity, lingula and bilateral lower lobe dependent and peribronchial opacity. Differential considerations include Pulmonary Edema with atelectasis versus Viral/atypical Respiratory Infection. 4. Underlying remote thoracic granulomatous disease. Superimposed reactive appearing mediastinal lymphadenopathy  felt related to #3. 5. Up to mild wall thickening of the right colon could be reactive due to the nearby free fluid in the right gutter or reflect mild focal colitis. No other bowel inflammation. 6. Cardiomegaly and extensive calcified atherosclerosis throughout the chest abdomen and pelvis. Suboptimal intravascular contrast bolus such that vascular patency is not well evaluated on this study. Electronically Signed   By: Odessa Fleming M.D.   On: 07/30/2016 14:37   US Carotid Bilateral  Result Date: 07/31/2016 CLINICAL DATA:  Cerebral infarction. History of hypertension, coronary artery disease and diabetes. EXAM: BILATERAL CAROTID DUPLEX ULTRASOUND TECHNIQUE: Wallace Cullens scale imaging, color Doppler and duplex ultrasound were performed of bilateral carotid and vertebral arteries in the neck. COMPARISON:  None. FINDINGS: Criteria: Quantification of carotid stenosis is based on velocity parameters that correlate the residual internal carotid diameter with NASCET-based stenosis levels, using the diameter of the distal internal carotid lumen as the denominator for stenosis measurement. The following velocity measurements were obtained: RIGHT ICA:  145/39 cm/sec CCA:  69/7 cm/sec SYSTOLIC ICA/CCA RATIO:  2.1 DIASTOLIC ICA/CCA RATIO:  5.6 ECA:  127 cm/sec LEFT ICA:  122/29 cm/sec CCA:  63/10 cm/sec SYSTOLIC ICA/CCA RATIO:  1.9 DIASTOLIC ICA/CCA RATIO:  2.9 ECA:  157 cm/sec RIGHT CAROTID ARTERY: There is a fairly mild amounts of partially calcified  plaque present at the level of the proximal right ICA. Velocities do show some elevation, however and no tortuosity is present. Strict categorization of estimated right ICA stenosis is 50- 69%. Stenosis is likely closer to 50% based on grayscale appearance. RIGHT VERTEBRAL ARTERY: Antegrade flow with normal waveform and velocity. LEFT CAROTID ARTERY: Minimal plaque is present in the proximal left ICA. Estimated left ICA stenosis is less than 50%. LEFT VERTEBRAL ARTERY: Antegrade flow with  normal waveform and velocity. IMPRESSION: Mild amount of plaque in both proximal internal carotid arteries. Due to some velocity elevation, categorization of right ICA stenosis is 50- 69% (stenosis likely closer to 50%). Estimated left ICA stenosis is less than 50%. Electronically Signed   By: Irish Lack M.D.   On: 07/31/2016 10:48   [place] Lower Ext Art Right Ltd  Result Date: 07/31/2016 CLINICAL DATA:  60 year old male with a history of right common femoral artery hematoma. EXAM: UNILATERAL RIGHT LOWER EXTREMITY ARTERIAL DUPLEX SCAN TECHNIQUE: Gray-scale sonography as well as color Doppler and duplex ultrasound was performed to evaluate the right lower extremity COMPARISON:  CT 07/30/2016 FINDINGS: Grayscale and color directed duplex imaging of the right lower extremity for evaluation of right inguinal hematoma. Ill-defined anechoic fluid collection in the right inguinal region measuring 4.7 cm x 1.8 cm x 3.2 cm. This overlies the femoral vasculature. Duplex imaging demonstrates swirling blood flow communicating with the fluid collection adjacent to the common femoral artery with neck measuring 4 mm at the common femoral artery. 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 Signed, Yvone Neu. Loreta Ave, DO [facility] Electronically Signed   By: Gilmer Mor D.O.   On: 07/31/2016 10:57   Dg Chest Port 1 View  Result Date: 08-03-2016 CLINICAL DATA:  Unresponsive.  Hypertension. EXAM: PORTABLE CHEST 1 VIEW COMPARISON:  01/23/2014. FINDINGS: Prior CABG. Cardiomegaly with mild bilateral pulmonary interstitial prominence suggesting mild CHF. No pleural effusion or pneumothorax . IMPRESSION: Prior CABG. Cardiomegaly with mild bilateral from interstitial prominence consistent mild CHF. Electronically Signed   By: Maisie Fus  Register   On: 2016-08-03 10:54    Ct Extrem Lower W Cm Bil  Result Date: 07/07/2016 CLINICAL DATA:  Severe right groin pain. Drop in hematocrit and hemoglobin. Status post right common femoral, profunda femoris and superficial femoral artery endarterectomies with separate core matrix patch angioplasty of the common and superficial femoral arteries and second patchy angioplasty of the profunda femoris artery on 07/03/2016. Additional angioplasties of the right popliteal and right posterior tibial arteries. EXAM: CT PELVIS AND BILATERAL LOWER EXTREMITIES WITH CONTRAST TECHNIQUE: Multidetector CT imaging of the pelvis and bilateral lower extremities was performed according to the standard protocol following bolus administration of intravenous contrast. Multiplanar CT image reconstructions were also generated. CONTRAST:  ISOVUE-300 IOPAMIDOL (ISOVUE-300) INJECTION 61% COMPARISON:  None. FINDINGS: CT PELVIS FINDINGS Urinary Tract:  Unremarkable urinary bladder and distal ureters. Bowel:  Unremarkable. Vascular/Lymphatic: Atheromatous arterial calcifications and plaques. Reproductive:  Normal sized prostate gland. Other: Small bilateral inguinal hernias containing fat. Mild right groin. Vascular and subcutaneous edema and postoperative air, extending superiorly and inferiorly. No discrete abscess or hematoma seen. Musculoskeletal: Mild lower lumbar spine degenerative changes. Left iliac bone cyst. CT LEFT/RIGHT LOWER EXTREMITY FINDINGS Bones/Joint/Cartilage Unremarkable. Ligaments Suboptimally assessed by CT. Muscles and Tendons Mild low density in the lateral aspect of the proximal adductor longus muscle on the right. No significant enlargement of the  masses Soft tissues Mild diffuse subcutaneous edema anteriorly, laterally and medially in the proximal right thigh. Vascular: Multifocal arterial atheromatous calcifications and plaques. IMPRESSION: 1. Right groin postoperative changes with mild associated soft tissue edema and air, including  mild edema in the lateral aspect of the proximal adductor longus muscle on the right. No visible hematoma. 2. Extensive bilateral arterial atheromatous changes. 3. Small bilateral inguinal hernias containing fat. Electronically Signed   By: Beckie Salts M.D.   On: 07/07/2016 13:28    Microbiology Recent Results (from the past 240 hour(s))  Blood Culture (routine x 2)     Status: None (Preliminary result)   Collection Time: 07/12/2016 10:30 AM  Result Value Ref Range Status   Specimen Description BLOOD  RIGHT HAND  Final   Special Requests BOTTLES DRAWN AEROBIC AND ANAEROBIC  BCAV  Final   Culture NO GROWTH 4 DAYS  Final   Report Status PENDING  Incomplete  Blood Culture (routine x 2)     Status: None (Preliminary result)   Collection Time: 07/15/2016 10:30 AM  Result Value Ref Range Status   Specimen Description BLOOD  RIGHT FOREARM  Final   Special Requests   Final    BOTTLES DRAWN AEROBIC AND ANAEROBIC Blood Culture results may not be optimal due to an excessive volume of blood received in culture bottles   Culture NO GROWTH 4 DAYS  Final   Report Status PENDING  Incomplete  MRSA PCR Screening     Status: None   Collection Time: 07/10/2016  2:15 PM  Result Value Ref Range Status   MRSA by PCR NEGATIVE NEGATIVE Final    Comment:        The GeneXpert MRSA Assay (FDA approved for NASAL specimens only), is one component of a comprehensive MRSA colonization surveillance program. It is not intended to diagnose MRSA infection nor to guide or monitor treatment for MRSA infections.     Lab Basic Metabolic Panel:  Recent Labs Lab 07/30/2016 1029 07/29/16 0351 07/30/16 0325 07/30/16 1756  NA 131* 130* 135  --   K 4.7 4.6 4.4  --   CL 93* 91* 93*  --   CO2 27 26 32  --   GLUCOSE 56* 88 110*  --   BUN 47* 53* 27*  --   CREATININE 8.60* 9.25* 5.71*  --   CALCIUM 8.1* 8.0* 7.9*  --   MG  --   --   --  1.8   Liver Function Tests:  Recent Labs Lab 07/30/2016 1029 07/29/16 0351   AST 32 28  ALT 21 18  ALKPHOS 262* 230*  BILITOT 0.7 1.0  PROT 6.7 6.5  ALBUMIN 2.3* 2.2*   No results for input(s): LIPASE, AMYLASE in the last 168 hours.  Recent Labs Lab 07/31/16 1438  AMMONIA 19   CBC:  Recent Labs Lab 07/25/2016 1029 07/16/2016 1552 07/29/16 0351 07/30/16 0325 07/30/16 1756 07/31/16 0430  WBC 7.1  --  6.0 5.6  --  6.1  NEUTROABS 5.7  --   --   --   --   --   HGB 6.7* 7.1* 6.8* 8.0* 8.2* 8.3*  HCT 20.1*  --  20.3* 23.7*  --  24.6*  MCV 89.0  --  87.6 85.6  --  87.4  PLT 208  --  203 210  --  213   Cardiac Enzymes:  Recent Labs Lab 07/03/2016 1029 07/12/2016 1552 07/01/2016 2146  TROPONINI 0.51* 0.53* 0.45*   Sepsis Labs:  Recent Labs Lab Aug 16, 2016 1028 August 16, 2016 1029 08-16-16 1552 07/29/16 0351 07/30/16 0325 07/31/16 0430  PROCALCITON  --   --  0.64 0.60 0.54  --   WBC  --  7.1  --  6.0 5.6 6.1  LATICACIDVEN 0.8  --   --   --   --   --     Procedures/Operations  None   Dequita Schleicher J 08/23/2016, 3:20 PM

## 2016-08-30 NOTE — Progress Notes (Signed)
Called Dr Cherlynn KaiserSainani patient passed away peacefully at 14:47 pm.  Paged nursing supervisor.

## 2016-08-30 DEATH — deceased

## 2018-05-25 IMAGING — CT CT HEAD W/O CM
3 series · 15 of 46 positions shown, 18 images · non-contrast
Comparison: None.

CLINICAL DATA: Episode of visual loss in RIGHT eye 3-4 weeks, still
with some visual deficits, history of hypertension, diabetes

EXAM:
CT HEAD WITHOUT CONTRAST
TECHNIQUE: Contiguous axial images were obtained from the base of the skull
through the vertex without intravenous contrast.

[Series 2: head wo · axial · 0.41mm/px · z∈[+605,+725]mm · 9 of 29 slices shown, 12 images]
[im 3/29  brain]
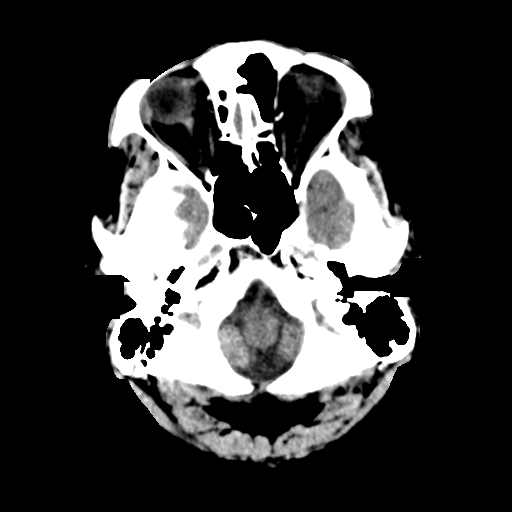
[im 3/29  bone]
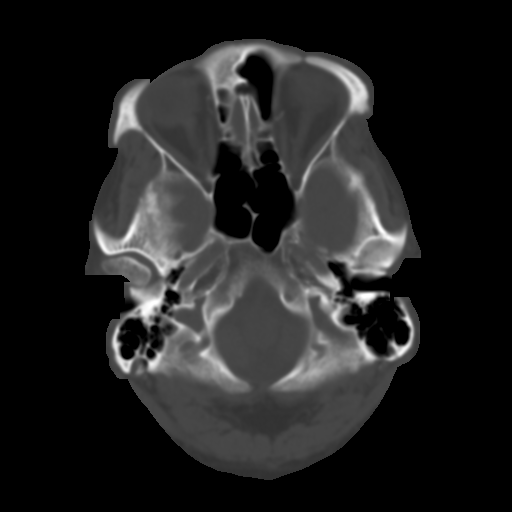
[im 6/29  brain]
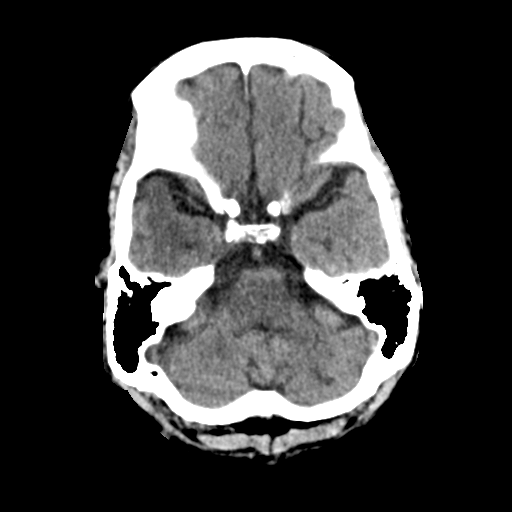
[im 9/29  brain]
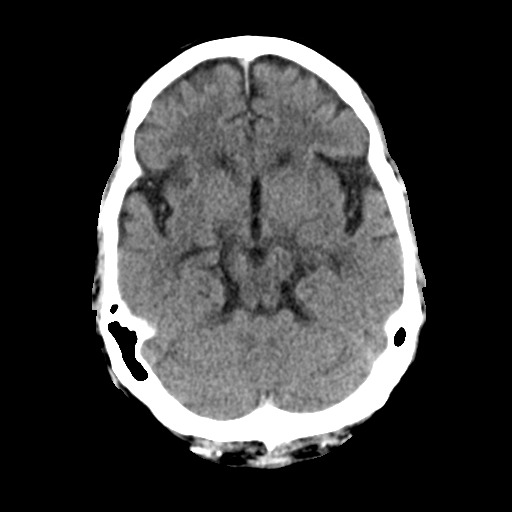
[im 12/29  brain]
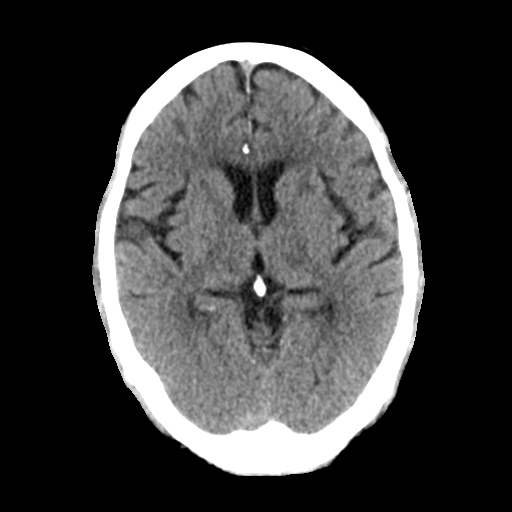
[im 15/29  brain]
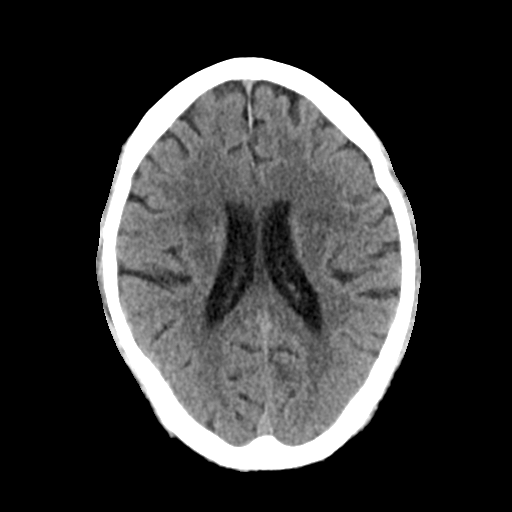
[im 15/29  bone]
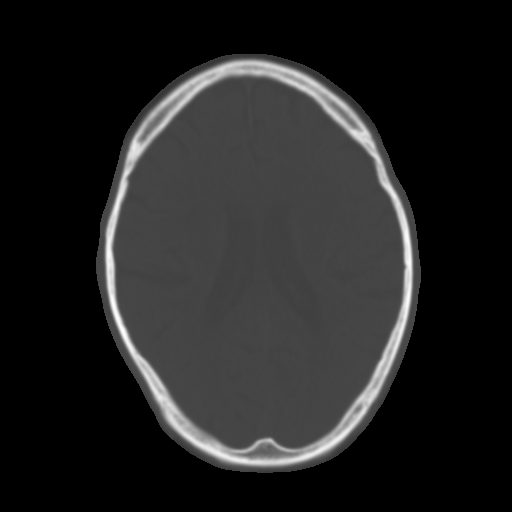
[im 18/29  brain]
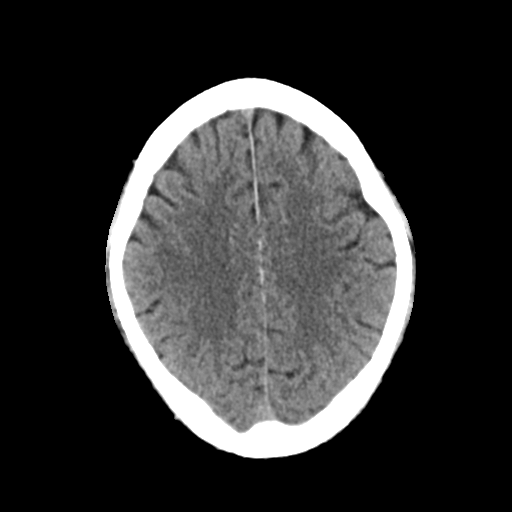
[im 21/29  brain]
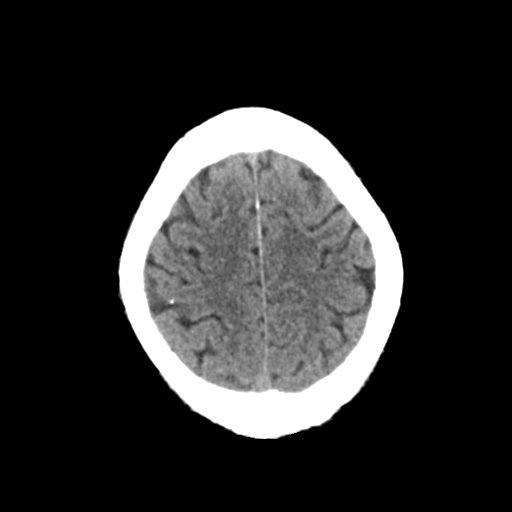
[im 24/29  brain]
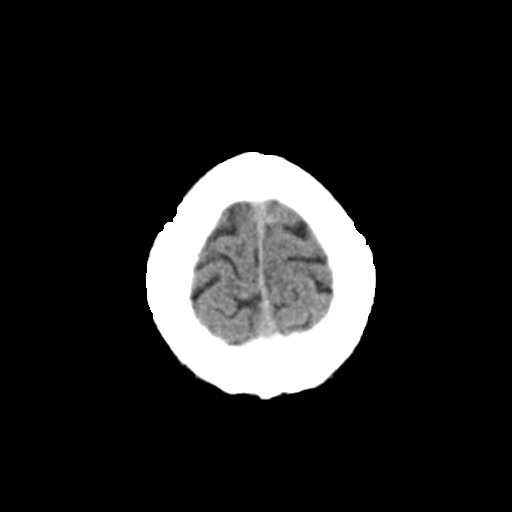
[im 27/29  brain]
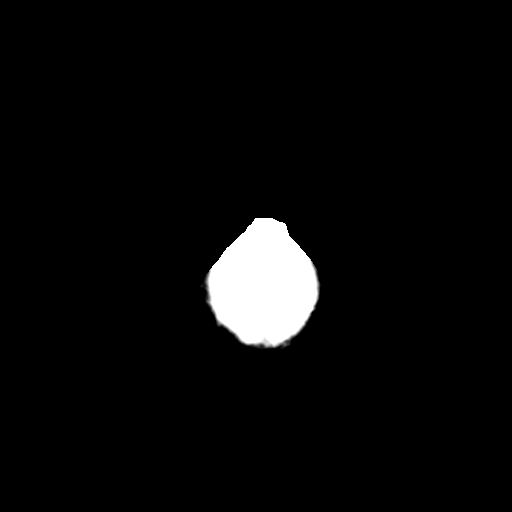
[im 27/29  bone]
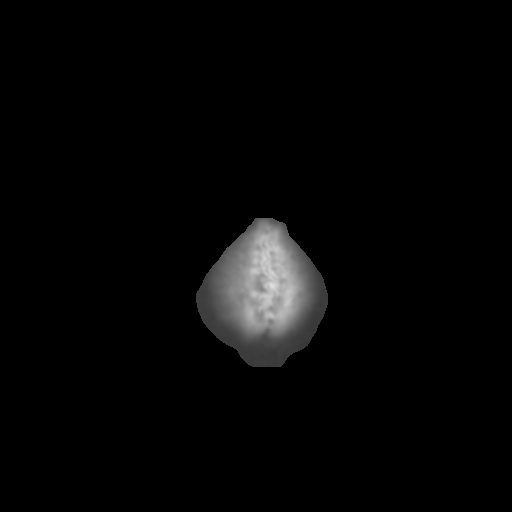

[Series 4: coronal soft tissue · coronal · 0.29mm/px · 3 of 62 slices shown]
[im 21/62  brain]
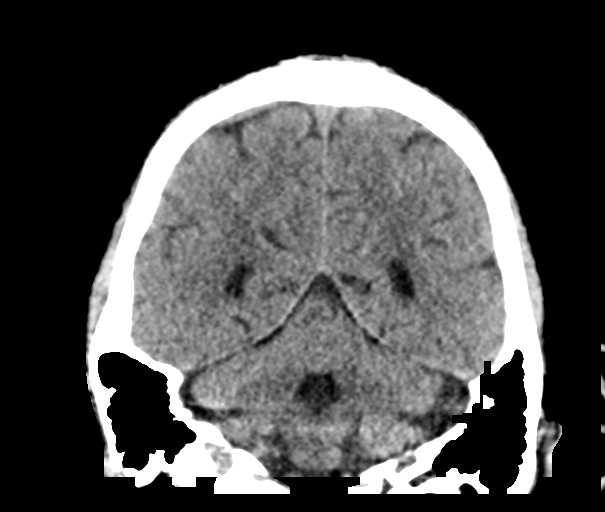
[im 28/62  brain]
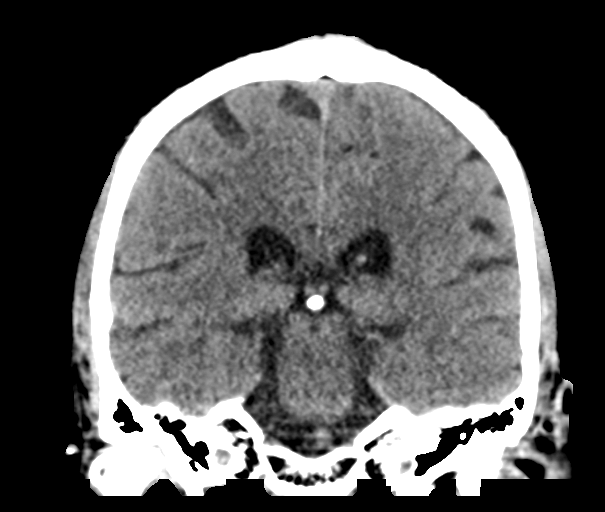
[im 34/62  brain]
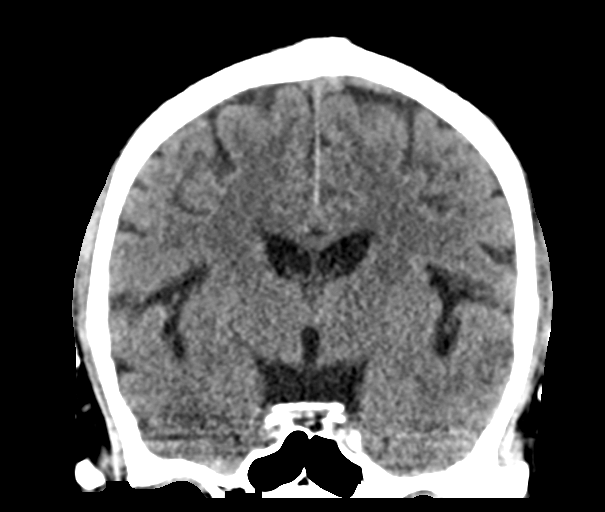

[Series 5: sagittal soft tissue · sagittal · 0.29mm/px · 3 of 49 slices shown]
[im 17/49  brain]
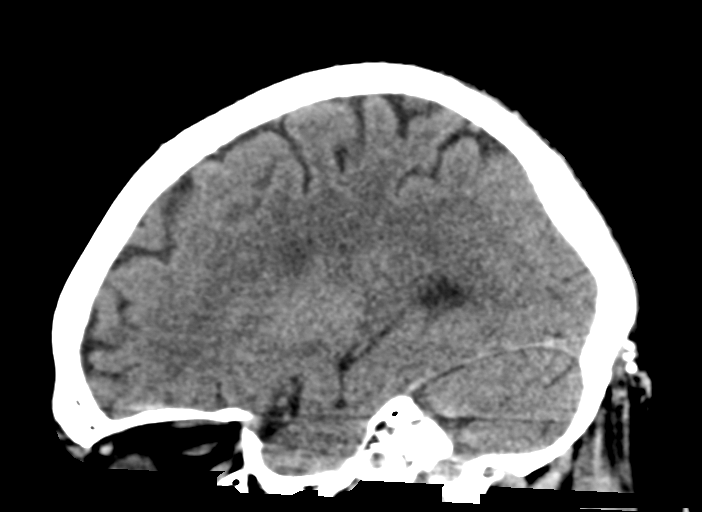
[im 25/49  brain]
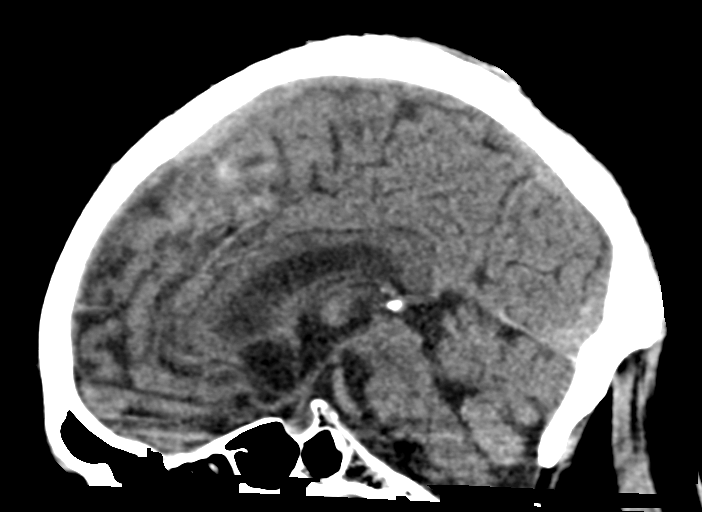
[im 33/49  brain]
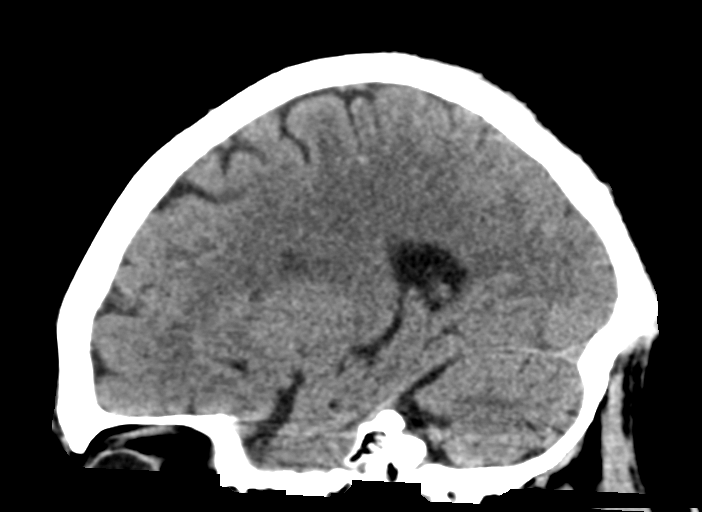

[15 of 46 positions shown; findings below may reference images not displayed]

FINDINGS: Brain: Generalized atrophy. Normal ventricular morphology. No
midline shift or mass effect. Small vessel chronic ischemic changes
of deep cerebral white matter. Small lacunar infarct in RIGHT
thalamus, age-indeterminate. No intracranial hemorrhage, mass lesion
or evidence of cortical infarction. No extra-axial fluid
collections.

Vascular: Atherosclerotic calcifications throughout the carotid
siphons.

Skull: Intact

Sinuses/Orbits: Clear

Other: N/A
IMPRESSION: Atrophy with small vessel chronic ischemic changes of deep cerebral
white matter.

Age-indeterminate lacunar infarct in the RIGHT thalamus.

No evidence of cortical infarction or intracranial hemorrhage.
